# Patient Record
Sex: Female | Born: 1970
Health system: Southern US, Community
[De-identification: ages and names within clinical notes are randomized; demographics above are authoritative.]

## PROBLEM LIST (undated history)

## (undated) DIAGNOSIS — N83209 Unspecified ovarian cyst, unspecified side: Secondary | ICD-10-CM

## (undated) DIAGNOSIS — E282 Polycystic ovarian syndrome: Secondary | ICD-10-CM

## (undated) DIAGNOSIS — N839 Noninflammatory disorder of ovary, fallopian tube and broad ligament, unspecified: Secondary | ICD-10-CM

## (undated) DIAGNOSIS — M5412 Radiculopathy, cervical region: Secondary | ICD-10-CM

## (undated) DIAGNOSIS — R002 Palpitations: Secondary | ICD-10-CM

## (undated) DIAGNOSIS — F419 Anxiety disorder, unspecified: Secondary | ICD-10-CM

## (undated) DIAGNOSIS — Z92241 Personal history of systemic steroid therapy: Secondary | ICD-10-CM

## (undated) DIAGNOSIS — R519 Headache, unspecified: Secondary | ICD-10-CM

## (undated) DIAGNOSIS — C9210 Chronic myeloid leukemia, BCR/ABL-positive, not having achieved remission: Secondary | ICD-10-CM

## (undated) DIAGNOSIS — N3010 Interstitial cystitis (chronic) without hematuria: Secondary | ICD-10-CM

## (undated) DIAGNOSIS — R29818 Other symptoms and signs involving the nervous system: Secondary | ICD-10-CM

## (undated) DIAGNOSIS — Z9889 Other specified postprocedural states: Secondary | ICD-10-CM

## (undated) DIAGNOSIS — J189 Pneumonia, unspecified organism: Secondary | ICD-10-CM

## (undated) DIAGNOSIS — J45909 Unspecified asthma, uncomplicated: Secondary | ICD-10-CM

## (undated) DIAGNOSIS — N7011 Chronic salpingitis: Secondary | ICD-10-CM

## (undated) DIAGNOSIS — R51 Headache: Secondary | ICD-10-CM

## (undated) DIAGNOSIS — M1990 Unspecified osteoarthritis, unspecified site: Secondary | ICD-10-CM

## (undated) DIAGNOSIS — R569 Unspecified convulsions: Secondary | ICD-10-CM

## (undated) DIAGNOSIS — D069 Carcinoma in situ of cervix, unspecified: Secondary | ICD-10-CM

## (undated) DIAGNOSIS — K8020 Calculus of gallbladder without cholecystitis without obstruction: Secondary | ICD-10-CM

## (undated) HISTORY — DX: Interstitial cystitis (chronic) without hematuria: N30.10

## (undated) HISTORY — PX: POLYPECTOMY: SHX149

## (undated) HISTORY — DX: Other symptoms and signs involving the nervous system: R29.818

## (undated) HISTORY — DX: Chronic salpingitis: N70.11

## (undated) HISTORY — DX: Carcinoma in situ of cervix, unspecified: D06.9

## (undated) HISTORY — DX: Other specified postprocedural states: Z98.890

## (undated) HISTORY — DX: Palpitations: R00.2

## (undated) HISTORY — PX: COLONOSCOPY: SHX174

## (undated) HISTORY — PX: DENTAL SURGERY: SHX609

## (undated) HISTORY — DX: Unspecified asthma, uncomplicated: J45.909

## (undated) HISTORY — PX: OTHER SURGICAL HISTORY: SHX169

## (undated) HISTORY — DX: Polycystic ovarian syndrome: E28.2

## (undated) HISTORY — DX: Calculus of gallbladder without cholecystitis without obstruction: K80.20

## (undated) HISTORY — DX: Chronic myeloid leukemia, BCR/ABL-positive, not having achieved remission: C92.10

## (undated) HISTORY — DX: Noninflammatory disorder of ovary, fallopian tube and broad ligament, unspecified: N83.9

## (undated) HISTORY — DX: Unspecified convulsions: R56.9

## (undated) HISTORY — DX: Anxiety disorder, unspecified: F41.9

## (undated) HISTORY — DX: Unspecified osteoarthritis, unspecified site: M19.90

## (undated) HISTORY — PX: UPPER GASTROINTESTINAL ENDOSCOPY: SHX188

## (undated) HISTORY — PX: BUNIONECTOMY: SHX129

## (undated) HISTORY — PX: UPPER GI ENDOSCOPY: SHX6162

---
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
## 1996-04-03 HISTORY — PX: LEEP: SHX91

## 1997-09-08 ENCOUNTER — Emergency Department (HOSPITAL_COMMUNITY): Admission: EM | Admit: 1997-09-08 | Discharge: 1997-09-08 | Payer: Self-pay | Admitting: Emergency Medicine

## 1998-09-18 ENCOUNTER — Emergency Department (HOSPITAL_COMMUNITY): Admission: EM | Admit: 1998-09-18 | Discharge: 1998-09-18 | Payer: Self-pay | Admitting: *Deleted

## 1998-09-18 ENCOUNTER — Encounter: Payer: Self-pay | Admitting: *Deleted

## 1999-04-04 HISTORY — PX: TONSILLECTOMY: SUR1361

## 1999-12-27 ENCOUNTER — Encounter: Payer: Self-pay | Admitting: Family Medicine

## 1999-12-27 ENCOUNTER — Encounter: Admission: RE | Admit: 1999-12-27 | Discharge: 1999-12-27 | Payer: Self-pay | Admitting: Family Medicine

## 2000-02-22 ENCOUNTER — Other Ambulatory Visit: Admission: RE | Admit: 2000-02-22 | Discharge: 2000-02-22 | Payer: Self-pay | Admitting: *Deleted

## 2000-02-22 ENCOUNTER — Encounter (INDEPENDENT_AMBULATORY_CARE_PROVIDER_SITE_OTHER): Payer: Self-pay

## 2000-04-03 DIAGNOSIS — D069 Carcinoma in situ of cervix, unspecified: Secondary | ICD-10-CM

## 2000-04-03 HISTORY — DX: Carcinoma in situ of cervix, unspecified: D06.9

## 2000-04-18 ENCOUNTER — Other Ambulatory Visit: Admission: RE | Admit: 2000-04-18 | Discharge: 2000-04-18 | Payer: Self-pay | Admitting: *Deleted

## 2000-04-18 ENCOUNTER — Encounter (INDEPENDENT_AMBULATORY_CARE_PROVIDER_SITE_OTHER): Payer: Self-pay

## 2000-08-24 ENCOUNTER — Other Ambulatory Visit: Admission: RE | Admit: 2000-08-24 | Discharge: 2000-08-24 | Payer: Self-pay | Admitting: *Deleted

## 2000-08-24 ENCOUNTER — Encounter (INDEPENDENT_AMBULATORY_CARE_PROVIDER_SITE_OTHER): Payer: Self-pay

## 2000-12-27 ENCOUNTER — Other Ambulatory Visit: Admission: RE | Admit: 2000-12-27 | Discharge: 2000-12-27 | Payer: Self-pay | Admitting: *Deleted

## 2001-04-15 ENCOUNTER — Encounter (INDEPENDENT_AMBULATORY_CARE_PROVIDER_SITE_OTHER): Payer: Self-pay | Admitting: *Deleted

## 2001-04-15 ENCOUNTER — Ambulatory Visit (HOSPITAL_BASED_OUTPATIENT_CLINIC_OR_DEPARTMENT_OTHER): Admission: RE | Admit: 2001-04-15 | Discharge: 2001-04-15 | Payer: Self-pay | Admitting: Otolaryngology

## 2001-12-10 ENCOUNTER — Encounter: Payer: Self-pay | Admitting: Neurosurgery

## 2001-12-10 ENCOUNTER — Observation Stay (HOSPITAL_COMMUNITY): Admission: RE | Admit: 2001-12-10 | Discharge: 2001-12-11 | Payer: Self-pay | Admitting: Neurosurgery

## 2003-04-04 DIAGNOSIS — R569 Unspecified convulsions: Secondary | ICD-10-CM

## 2003-04-04 HISTORY — DX: Unspecified convulsions: R56.9

## 2003-04-04 HISTORY — PX: CERVICAL DISC SURGERY: SHX588

## 2003-05-05 ENCOUNTER — Emergency Department (HOSPITAL_COMMUNITY): Admission: EM | Admit: 2003-05-05 | Discharge: 2003-05-05 | Payer: Self-pay | Admitting: Emergency Medicine

## 2003-05-29 ENCOUNTER — Emergency Department (HOSPITAL_COMMUNITY): Admission: EM | Admit: 2003-05-29 | Discharge: 2003-05-29 | Payer: Self-pay | Admitting: Family Medicine

## 2003-06-25 ENCOUNTER — Encounter: Admission: RE | Admit: 2003-06-25 | Discharge: 2003-06-25 | Payer: Self-pay | Admitting: Internal Medicine

## 2003-07-24 ENCOUNTER — Encounter: Admission: RE | Admit: 2003-07-24 | Discharge: 2003-07-24 | Payer: Self-pay | Admitting: Internal Medicine

## 2003-11-19 ENCOUNTER — Emergency Department (HOSPITAL_COMMUNITY): Admission: EM | Admit: 2003-11-19 | Discharge: 2003-11-19 | Payer: Self-pay | Admitting: Emergency Medicine

## 2004-07-01 ENCOUNTER — Ambulatory Visit: Payer: Self-pay | Admitting: Internal Medicine

## 2004-11-10 ENCOUNTER — Emergency Department (HOSPITAL_COMMUNITY): Admission: EM | Admit: 2004-11-10 | Discharge: 2004-11-10 | Payer: Self-pay | Admitting: Emergency Medicine

## 2004-12-04 ENCOUNTER — Emergency Department (HOSPITAL_COMMUNITY): Admission: EM | Admit: 2004-12-04 | Discharge: 2004-12-04 | Payer: Self-pay | Admitting: *Deleted

## 2005-05-24 ENCOUNTER — Ambulatory Visit: Payer: Self-pay | Admitting: Internal Medicine

## 2005-05-29 ENCOUNTER — Ambulatory Visit (HOSPITAL_COMMUNITY): Admission: RE | Admit: 2005-05-29 | Discharge: 2005-05-29 | Payer: Self-pay | Admitting: *Deleted

## 2005-06-08 ENCOUNTER — Ambulatory Visit (HOSPITAL_COMMUNITY): Admission: RE | Admit: 2005-06-08 | Discharge: 2005-06-08 | Payer: Self-pay | Admitting: *Deleted

## 2005-07-06 ENCOUNTER — Ambulatory Visit: Payer: Self-pay | Admitting: Hospitalist

## 2005-07-06 ENCOUNTER — Encounter: Admission: RE | Admit: 2005-07-06 | Discharge: 2005-07-06 | Payer: Self-pay | Admitting: Hospitalist

## 2005-07-06 ENCOUNTER — Ambulatory Visit (HOSPITAL_COMMUNITY): Admission: RE | Admit: 2005-07-06 | Discharge: 2005-07-06 | Payer: Self-pay | Admitting: Hospitalist

## 2005-07-10 ENCOUNTER — Encounter (INDEPENDENT_AMBULATORY_CARE_PROVIDER_SITE_OTHER): Payer: Self-pay | Admitting: *Deleted

## 2005-07-10 ENCOUNTER — Ambulatory Visit (HOSPITAL_COMMUNITY): Admission: RE | Admit: 2005-07-10 | Discharge: 2005-07-10 | Payer: Self-pay | Admitting: Obstetrics & Gynecology

## 2005-07-12 ENCOUNTER — Ambulatory Visit: Payer: Self-pay | Admitting: Internal Medicine

## 2005-07-12 ENCOUNTER — Ambulatory Visit (HOSPITAL_COMMUNITY): Admission: RE | Admit: 2005-07-12 | Discharge: 2005-07-12 | Payer: Self-pay | Admitting: Internal Medicine

## 2005-07-31 ENCOUNTER — Ambulatory Visit: Payer: Self-pay | Admitting: Hospitalist

## 2005-07-31 ENCOUNTER — Ambulatory Visit (HOSPITAL_COMMUNITY): Admission: RE | Admit: 2005-07-31 | Discharge: 2005-07-31 | Payer: Self-pay | Admitting: Hospitalist

## 2005-08-01 ENCOUNTER — Ambulatory Visit: Payer: Self-pay | Admitting: Internal Medicine

## 2005-11-21 ENCOUNTER — Ambulatory Visit: Payer: Self-pay | Admitting: Hospitalist

## 2005-12-21 ENCOUNTER — Ambulatory Visit: Payer: Self-pay | Admitting: Hospitalist

## 2006-01-08 DIAGNOSIS — R42 Dizziness and giddiness: Secondary | ICD-10-CM | POA: Insufficient documentation

## 2006-01-08 DIAGNOSIS — H5310 Unspecified subjective visual disturbances: Secondary | ICD-10-CM | POA: Insufficient documentation

## 2006-01-08 DIAGNOSIS — H539 Unspecified visual disturbance: Secondary | ICD-10-CM | POA: Insufficient documentation

## 2006-01-08 DIAGNOSIS — M5030 Other cervical disc degeneration, unspecified cervical region: Secondary | ICD-10-CM | POA: Insufficient documentation

## 2006-01-08 DIAGNOSIS — N6019 Diffuse cystic mastopathy of unspecified breast: Secondary | ICD-10-CM | POA: Insufficient documentation

## 2006-01-08 DIAGNOSIS — F17200 Nicotine dependence, unspecified, uncomplicated: Secondary | ICD-10-CM | POA: Insufficient documentation

## 2006-04-11 ENCOUNTER — Ambulatory Visit (HOSPITAL_COMMUNITY): Admission: RE | Admit: 2006-04-11 | Discharge: 2006-04-11 | Payer: Self-pay | Admitting: Internal Medicine

## 2006-04-11 ENCOUNTER — Ambulatory Visit: Payer: Self-pay | Admitting: Internal Medicine

## 2006-04-27 ENCOUNTER — Encounter: Admission: RE | Admit: 2006-04-27 | Discharge: 2006-05-25 | Payer: Self-pay | Admitting: Internal Medicine

## 2006-05-25 ENCOUNTER — Encounter (INDEPENDENT_AMBULATORY_CARE_PROVIDER_SITE_OTHER): Payer: Self-pay | Admitting: *Deleted

## 2006-06-29 ENCOUNTER — Ambulatory Visit: Payer: Self-pay | Admitting: Internal Medicine

## 2006-06-29 ENCOUNTER — Encounter (INDEPENDENT_AMBULATORY_CARE_PROVIDER_SITE_OTHER): Payer: Self-pay | Admitting: *Deleted

## 2006-06-29 DIAGNOSIS — R439 Unspecified disturbances of smell and taste: Secondary | ICD-10-CM | POA: Insufficient documentation

## 2006-06-29 LAB — CONVERTED CEMR LAB
ALT: 17 units/L (ref 0–35)
AST: 16 units/L (ref 0–37)
Albumin: 4.3 g/dL (ref 3.5–5.2)
Alkaline Phosphatase: 52 units/L (ref 39–117)
BUN: 11 mg/dL (ref 6–23)
CO2: 22 meq/L (ref 19–32)
Calcium: 9.3 mg/dL (ref 8.4–10.5)
Chloride: 105 meq/L (ref 96–112)
Creatinine, Ser: 0.84 mg/dL (ref 0.40–1.20)
Glucose, Bld: 93 mg/dL (ref 70–99)
HCT: 40.6 % (ref 36.0–46.0)
Hemoglobin: 13.8 g/dL (ref 12.0–15.0)
MCHC: 34 g/dL (ref 30.0–36.0)
MCV: 84.9 fL (ref 78.0–100.0)
Platelets: 279 10*3/uL (ref 150–400)
Potassium: 3.8 meq/L (ref 3.5–5.3)
RBC: 4.78 M/uL (ref 3.87–5.11)
RDW: 12.8 % (ref 11.5–14.0)
Sodium: 139 meq/L (ref 135–145)
Total Bilirubin: 0.4 mg/dL (ref 0.3–1.2)
Total Protein: 6.9 g/dL (ref 6.0–8.3)
WBC: 8.9 10*3/uL (ref 4.0–10.5)

## 2006-07-07 ENCOUNTER — Ambulatory Visit (HOSPITAL_COMMUNITY): Admission: RE | Admit: 2006-07-07 | Discharge: 2006-07-07 | Payer: Self-pay | Admitting: *Deleted

## 2006-10-08 ENCOUNTER — Telehealth: Payer: Self-pay | Admitting: *Deleted

## 2006-10-09 ENCOUNTER — Ambulatory Visit: Payer: Self-pay | Admitting: Internal Medicine

## 2006-10-09 ENCOUNTER — Emergency Department (HOSPITAL_COMMUNITY): Admission: EM | Admit: 2006-10-09 | Discharge: 2006-10-09 | Payer: Self-pay | Admitting: Emergency Medicine

## 2006-10-10 ENCOUNTER — Telehealth (INDEPENDENT_AMBULATORY_CARE_PROVIDER_SITE_OTHER): Payer: Self-pay | Admitting: *Deleted

## 2006-10-15 ENCOUNTER — Telehealth (INDEPENDENT_AMBULATORY_CARE_PROVIDER_SITE_OTHER): Payer: Self-pay | Admitting: *Deleted

## 2006-11-06 ENCOUNTER — Telehealth: Payer: Self-pay | Admitting: *Deleted

## 2006-11-07 ENCOUNTER — Encounter (INDEPENDENT_AMBULATORY_CARE_PROVIDER_SITE_OTHER): Payer: Self-pay | Admitting: *Deleted

## 2006-11-07 ENCOUNTER — Ambulatory Visit: Payer: Self-pay | Admitting: Internal Medicine

## 2006-11-07 ENCOUNTER — Ambulatory Visit (HOSPITAL_COMMUNITY): Admission: RE | Admit: 2006-11-07 | Discharge: 2006-11-07 | Payer: Self-pay | Admitting: Internal Medicine

## 2006-11-07 DIAGNOSIS — R002 Palpitations: Secondary | ICD-10-CM | POA: Insufficient documentation

## 2006-11-09 ENCOUNTER — Encounter (INDEPENDENT_AMBULATORY_CARE_PROVIDER_SITE_OTHER): Payer: Self-pay | Admitting: *Deleted

## 2006-11-09 ENCOUNTER — Ambulatory Visit: Payer: Self-pay | Admitting: Internal Medicine

## 2006-11-09 LAB — CONVERTED CEMR LAB
ALT: 15 units/L (ref 0–35)
AST: 15 units/L (ref 0–37)
Albumin: 4 g/dL (ref 3.5–5.2)
Alkaline Phosphatase: 47 units/L (ref 39–117)
BUN: 10 mg/dL (ref 6–23)
Basophils Absolute: 0.1 10*3/uL (ref 0.0–0.1)
Basophils Relative: 1 % (ref 0–1)
CO2: 25 meq/L (ref 19–32)
Calcium: 9.4 mg/dL (ref 8.4–10.5)
Chloride: 107 meq/L (ref 96–112)
Cholesterol: 155 mg/dL (ref 0–200)
Creatinine, Ser: 0.77 mg/dL (ref 0.40–1.20)
Eosinophils Absolute: 0.5 10*3/uL (ref 0.0–0.7)
Eosinophils Relative: 6 % — ABNORMAL HIGH (ref 0–5)
Glucose, Bld: 82 mg/dL (ref 70–99)
HCT: 43.3 % (ref 36.0–46.0)
HDL: 45 mg/dL (ref >39)
Hemoglobin: 14.9 g/dL (ref 12.0–15.0)
LDL Cholesterol: 96 mg/dL (ref 0–99)
Lymphocytes Relative: 33 % (ref 12–46)
Lymphs Abs: 2.6 10*3/uL (ref 0.7–3.3)
MCHC: 34.4 g/dL (ref 30.0–36.0)
MCV: 85.6 fL (ref 78.0–100.0)
Monocytes Absolute: 0.5 10*3/uL (ref 0.2–0.7)
Monocytes Relative: 7 % (ref 3–11)
Neutro Abs: 4.4 10*3/uL (ref 1.7–7.7)
Neutrophils Relative %: 55 % (ref 43–77)
Platelets: 284 10*3/uL (ref 150–400)
Potassium: 4.4 meq/L (ref 3.5–5.3)
RBC: 5.06 M/uL (ref 3.87–5.11)
RDW: 13 % (ref 11.5–14.0)
Sodium: 139 meq/L (ref 135–145)
TSH: 0.998 microintl units/mL (ref 0.350–5.50)
Total Bilirubin: 0.6 mg/dL (ref 0.3–1.2)
Total CHOL/HDL Ratio: 3.4
Total Protein: 6.6 g/dL (ref 6.0–8.3)
Triglycerides: 70 mg/dL (ref <150)
VLDL: 14 mg/dL (ref 0–40)
WBC: 8.1 10*3/uL (ref 4.0–10.5)

## 2006-11-21 ENCOUNTER — Ambulatory Visit: Payer: Self-pay | Admitting: Internal Medicine

## 2007-01-29 ENCOUNTER — Ambulatory Visit (HOSPITAL_COMMUNITY): Admission: RE | Admit: 2007-01-29 | Discharge: 2007-01-29 | Payer: Self-pay | Admitting: *Deleted

## 2007-02-10 ENCOUNTER — Emergency Department (HOSPITAL_COMMUNITY): Admission: EM | Admit: 2007-02-10 | Discharge: 2007-02-10 | Payer: Self-pay | Admitting: Emergency Medicine

## 2007-04-01 ENCOUNTER — Ambulatory Visit: Payer: Self-pay | Admitting: Internal Medicine

## 2007-04-01 ENCOUNTER — Encounter (INDEPENDENT_AMBULATORY_CARE_PROVIDER_SITE_OTHER): Payer: Self-pay | Admitting: Internal Medicine

## 2007-04-01 LAB — CONVERTED CEMR LAB
ALT: 17 units/L (ref 0–35)
AST: 16 units/L (ref 0–37)
Albumin: 4.6 g/dL (ref 3.5–5.2)
Alkaline Phosphatase: 53 units/L (ref 39–117)
BUN: 10 mg/dL (ref 6–23)
CO2: 26 meq/L (ref 19–32)
Calcium: 9.9 mg/dL (ref 8.4–10.5)
Chloride: 103 meq/L (ref 96–112)
Creatinine, Ser: 0.78 mg/dL (ref 0.40–1.20)
Glucose, Bld: 88 mg/dL (ref 70–99)
Potassium: 4.5 meq/L (ref 3.5–5.3)
Sodium: 140 meq/L (ref 135–145)
Total Bilirubin: 0.7 mg/dL (ref 0.3–1.2)
Total Protein: 7.3 g/dL (ref 6.0–8.3)

## 2007-04-08 ENCOUNTER — Encounter (INDEPENDENT_AMBULATORY_CARE_PROVIDER_SITE_OTHER): Payer: Self-pay | Admitting: Internal Medicine

## 2007-04-08 ENCOUNTER — Ambulatory Visit: Payer: Self-pay | Admitting: Internal Medicine

## 2007-04-08 DIAGNOSIS — R1031 Right lower quadrant pain: Secondary | ICD-10-CM | POA: Insufficient documentation

## 2007-04-08 LAB — CONVERTED CEMR LAB
Beta hcg, urine, semiquantitative: NEGATIVE
Bilirubin Urine: NEGATIVE
Blood in Urine, dipstick: NEGATIVE
Glucose, Urine, Semiquant: NEGATIVE
Ketones, urine, test strip: NEGATIVE
Nitrite: NEGATIVE
Protein, U semiquant: NEGATIVE
Specific Gravity, Urine: 1.015
Urobilinogen, UA: NEGATIVE
WBC Urine, dipstick: NEGATIVE
pH: 5.5

## 2007-04-09 ENCOUNTER — Encounter (INDEPENDENT_AMBULATORY_CARE_PROVIDER_SITE_OTHER): Payer: Self-pay | Admitting: Internal Medicine

## 2007-04-09 LAB — CONVERTED CEMR LAB
Chlamydia, DNA Probe: NEGATIVE
GC Probe Amp, Genital: NEGATIVE

## 2007-04-10 LAB — CONVERTED CEMR LAB
Candida species: NEGATIVE
Gardnerella vaginalis: NEGATIVE
Trichomonal Vaginitis: NEGATIVE

## 2007-04-22 ENCOUNTER — Ambulatory Visit: Payer: Self-pay | Admitting: Internal Medicine

## 2007-04-22 ENCOUNTER — Telehealth: Payer: Self-pay | Admitting: *Deleted

## 2007-04-22 DIAGNOSIS — G988 Other disorders of nervous system: Secondary | ICD-10-CM | POA: Insufficient documentation

## 2007-05-09 ENCOUNTER — Ambulatory Visit: Payer: Self-pay | Admitting: Internal Medicine

## 2007-05-09 ENCOUNTER — Encounter (INDEPENDENT_AMBULATORY_CARE_PROVIDER_SITE_OTHER): Payer: Self-pay | Admitting: *Deleted

## 2007-05-09 DIAGNOSIS — M130 Polyarthritis, unspecified: Secondary | ICD-10-CM | POA: Insufficient documentation

## 2007-05-09 LAB — CONVERTED CEMR LAB
Anti Nuclear Antibody(ANA): NEGATIVE
Bilirubin Urine: NEGATIVE
Cyclic Citrullin Peptide Ab: 2 units (ref <7)
Hemoglobin, Urine: NEGATIVE
Ketones, ur: NEGATIVE mg/dL
Leukocytes, UA: NEGATIVE
Nitrite: NEGATIVE
Protein, ur: NEGATIVE mg/dL
Rhuematoid fact SerPl-aCnc: <20 intl units/mL (ref 0–20)
Sed Rate: 7 mm/hr (ref 0–22)
Specific Gravity, Urine: 1.015 (ref 1.005–1.03)
TSH: 1.463 microintl units/mL (ref 0.350–5.50)
Urine Glucose: NEGATIVE mg/dL
Urobilinogen, UA: 0.2 (ref 0.0–1.0)
pH: 6.5 (ref 5.0–8.0)

## 2007-05-13 ENCOUNTER — Encounter: Payer: Self-pay | Admitting: Internal Medicine

## 2007-05-13 ENCOUNTER — Telehealth: Payer: Self-pay | Admitting: *Deleted

## 2007-05-13 ENCOUNTER — Ambulatory Visit: Payer: Self-pay | Admitting: Hospitalist

## 2007-05-13 DIAGNOSIS — N898 Other specified noninflammatory disorders of vagina: Secondary | ICD-10-CM | POA: Insufficient documentation

## 2007-05-13 LAB — CONVERTED CEMR LAB
ALT: 14 units/L (ref 0–35)
AST: 15 units/L (ref 0–37)
Albumin: 4.5 g/dL (ref 3.5–5.2)
Alkaline Phosphatase: 48 units/L (ref 39–117)
BUN: 11 mg/dL (ref 6–23)
Basophils Absolute: 0.1 10*3/uL (ref 0.0–0.1)
Basophils Relative: 1 % (ref 0–1)
Bilirubin Urine: NEGATIVE
CO2: 22 meq/L (ref 19–32)
Calcium: 9.5 mg/dL (ref 8.4–10.5)
Chlamydia, DNA Probe: NEGATIVE
Chloride: 106 meq/L (ref 96–112)
Creatinine, Ser: 0.8 mg/dL (ref 0.40–1.20)
Eosinophils Absolute: 0.3 10*3/uL (ref 0.0–0.7)
Eosinophils Relative: 3 % (ref 0–5)
GC Probe Amp, Genital: NEGATIVE
Glucose, Bld: 84 mg/dL (ref 70–99)
HCT: 41.5 % (ref 36.0–46.0)
Hemoglobin, Urine: NEGATIVE
Hemoglobin: 13.7 g/dL (ref 12.0–15.0)
Ketones, ur: NEGATIVE mg/dL
Leukocytes, UA: NEGATIVE
Lymphocytes Relative: 23 % (ref 12–46)
Lymphs Abs: 2.1 10*3/uL (ref 0.7–4.0)
MCHC: 33 g/dL (ref 30.0–36.0)
MCV: 86.8 fL (ref 78.0–100.0)
Monocytes Absolute: 0.5 10*3/uL (ref 0.1–1.0)
Monocytes Relative: 6 % (ref 3–12)
Neutro Abs: 6.1 10*3/uL (ref 1.7–7.7)
Neutrophils Relative %: 67 % (ref 43–77)
Nitrite: NEGATIVE
Platelets: 281 10*3/uL (ref 150–400)
Potassium: 4.2 meq/L (ref 3.5–5.3)
Protein, ur: NEGATIVE mg/dL
RBC: 4.78 M/uL (ref 3.87–5.11)
RDW: 13 % (ref 11.5–15.5)
Sodium: 139 meq/L (ref 135–145)
Specific Gravity, Urine: 1.02 (ref 1.005–1.03)
Tissue Transglutaminase Ab, IgA: 0.3 units (ref <7)
Total Bilirubin: 0.7 mg/dL (ref 0.3–1.2)
Total Protein: 7 g/dL (ref 6.0–8.3)
Urine Glucose: NEGATIVE mg/dL
Urobilinogen, UA: 0.2 (ref 0.0–1.0)
WBC: 9.1 10*3/uL (ref 4.0–10.5)
pH: 7 (ref 5.0–8.0)

## 2007-05-14 LAB — CONVERTED CEMR LAB
Candida species: POSITIVE — AB
Gardnerella vaginalis: NEGATIVE
Trichomonal Vaginitis: NEGATIVE

## 2007-05-17 ENCOUNTER — Emergency Department (HOSPITAL_COMMUNITY): Admission: EM | Admit: 2007-05-17 | Discharge: 2007-05-17 | Payer: Self-pay | Admitting: Emergency Medicine

## 2007-05-28 ENCOUNTER — Ambulatory Visit: Payer: Self-pay | Admitting: Internal Medicine

## 2007-05-28 DIAGNOSIS — F411 Generalized anxiety disorder: Secondary | ICD-10-CM | POA: Insufficient documentation

## 2007-05-28 LAB — CONVERTED CEMR LAB
Bilirubin Urine: NEGATIVE
Glucose, Urine, Semiquant: NEGATIVE
Ketones, urine, test strip: NEGATIVE
Nitrite: NEGATIVE
Protein, U semiquant: NEGATIVE
Specific Gravity, Urine: 1.01
Urobilinogen, UA: 1
WBC Urine, dipstick: NEGATIVE
pH: 7

## 2007-05-31 ENCOUNTER — Ambulatory Visit (HOSPITAL_COMMUNITY): Admission: RE | Admit: 2007-05-31 | Discharge: 2007-05-31 | Payer: Self-pay | Admitting: Internal Medicine

## 2007-06-06 ENCOUNTER — Ambulatory Visit: Payer: Self-pay | Admitting: *Deleted

## 2007-06-06 DIAGNOSIS — N83209 Unspecified ovarian cyst, unspecified side: Secondary | ICD-10-CM | POA: Insufficient documentation

## 2007-06-10 ENCOUNTER — Ambulatory Visit (HOSPITAL_COMMUNITY): Admission: RE | Admit: 2007-06-10 | Discharge: 2007-06-10 | Payer: Self-pay | Admitting: *Deleted

## 2007-06-21 ENCOUNTER — Telehealth (INDEPENDENT_AMBULATORY_CARE_PROVIDER_SITE_OTHER): Payer: Self-pay | Admitting: *Deleted

## 2007-07-18 ENCOUNTER — Telehealth: Payer: Self-pay | Admitting: Infectious Disease

## 2007-07-18 ENCOUNTER — Emergency Department (HOSPITAL_COMMUNITY): Admission: EM | Admit: 2007-07-18 | Discharge: 2007-07-18 | Payer: Self-pay | Admitting: Emergency Medicine

## 2007-07-26 ENCOUNTER — Ambulatory Visit: Payer: Self-pay | Admitting: *Deleted

## 2007-07-26 ENCOUNTER — Encounter: Payer: Self-pay | Admitting: Internal Medicine

## 2007-08-28 ENCOUNTER — Telehealth: Payer: Self-pay | Admitting: *Deleted

## 2007-08-28 ENCOUNTER — Encounter (INDEPENDENT_AMBULATORY_CARE_PROVIDER_SITE_OTHER): Payer: Self-pay | Admitting: Internal Medicine

## 2007-08-28 ENCOUNTER — Ambulatory Visit: Payer: Self-pay | Admitting: Internal Medicine

## 2007-08-29 LAB — CONVERTED CEMR LAB
Bilirubin Urine: NEGATIVE
Hemoglobin, Urine: NEGATIVE
Ketones, ur: NEGATIVE mg/dL
Leukocytes, UA: NEGATIVE
Nitrite: NEGATIVE
Protein, ur: NEGATIVE mg/dL
Specific Gravity, Urine: 1.02 (ref 1.005–1.03)
Urine Glucose: NEGATIVE mg/dL
Urobilinogen, UA: 1 (ref 0.0–1.0)
pH: 7 (ref 5.0–8.0)

## 2007-10-14 ENCOUNTER — Ambulatory Visit: Payer: Self-pay | Admitting: *Deleted

## 2007-10-14 ENCOUNTER — Encounter: Payer: Self-pay | Admitting: Internal Medicine

## 2007-10-14 ENCOUNTER — Ambulatory Visit (HOSPITAL_COMMUNITY): Admission: RE | Admit: 2007-10-14 | Discharge: 2007-10-14 | Payer: Self-pay | Admitting: *Deleted

## 2007-10-14 DIAGNOSIS — R000 Tachycardia, unspecified: Secondary | ICD-10-CM | POA: Insufficient documentation

## 2007-10-28 ENCOUNTER — Telehealth: Payer: Self-pay | Admitting: *Deleted

## 2007-10-28 ENCOUNTER — Ambulatory Visit: Payer: Self-pay | Admitting: Internal Medicine

## 2007-10-28 DIAGNOSIS — R51 Headache: Secondary | ICD-10-CM | POA: Insufficient documentation

## 2007-10-28 DIAGNOSIS — R519 Headache, unspecified: Secondary | ICD-10-CM | POA: Insufficient documentation

## 2007-11-29 ENCOUNTER — Encounter (INDEPENDENT_AMBULATORY_CARE_PROVIDER_SITE_OTHER): Payer: Self-pay | Admitting: *Deleted

## 2007-11-29 ENCOUNTER — Ambulatory Visit: Payer: Self-pay | Admitting: Internal Medicine

## 2007-12-24 ENCOUNTER — Telehealth (INDEPENDENT_AMBULATORY_CARE_PROVIDER_SITE_OTHER): Payer: Self-pay | Admitting: Internal Medicine

## 2007-12-24 ENCOUNTER — Encounter (INDEPENDENT_AMBULATORY_CARE_PROVIDER_SITE_OTHER): Payer: Self-pay | Admitting: Internal Medicine

## 2008-01-14 ENCOUNTER — Ambulatory Visit: Payer: Self-pay | Admitting: Infectious Disease

## 2008-01-14 ENCOUNTER — Encounter: Payer: Self-pay | Admitting: Internal Medicine

## 2008-01-18 LAB — CONVERTED CEMR LAB
ALT: 12 units/L (ref 0–35)
AST: 15 units/L (ref 0–37)
Albumin ELP: 61.2 % (ref 55.8–66.1)
Albumin: 4.5 g/dL (ref 3.5–5.2)
Alkaline Phosphatase: 47 units/L (ref 39–117)
Alpha-1-Globulin: 3.9 % (ref 2.9–4.9)
Alpha-2-Globulin: 9.2 % (ref 7.1–11.8)
Anti Nuclear Antibody(ANA): NEGATIVE
BUN: 14 mg/dL (ref 6–23)
Basophils Absolute: 0.1 10*3/uL (ref 0.0–0.1)
Basophils Relative: 1 % (ref 0–1)
Beta Globulin: 5.2 % (ref 4.7–7.2)
CO2: 22 meq/L (ref 19–32)
CRP: 0.1 mg/dL (ref <0.6)
Calcium: 9.4 mg/dL (ref 8.4–10.5)
Chloride: 106 meq/L (ref 96–112)
Creatinine, Ser: 0.77 mg/dL (ref 0.40–1.20)
Eosinophils Absolute: 0.6 10*3/uL (ref 0.0–0.7)
Eosinophils Relative: 5 % (ref 0–5)
Gamma Globulin: 16.3 % (ref 11.1–18.8)
Glucose, Bld: 85 mg/dL (ref 70–99)
HCT: 39.3 % (ref 36.0–46.0)
HCV Ab: NEGATIVE
Hemoglobin: 14 g/dL (ref 12.0–15.0)
Hep B Core Total Ab: NEGATIVE
Hep B S Ab: NEGATIVE
Hepatitis B Surface Ag: NEGATIVE
LDH: 138 units/L (ref 94–250)
Lymphocytes Relative: 27 % (ref 12–46)
Lymphs Abs: 3.4 10*3/uL (ref 0.7–4.0)
MCHC: 35.6 g/dL (ref 30.0–36.0)
MCV: 84.7 fL (ref 78.0–100.0)
Monocytes Absolute: 0.7 10*3/uL (ref 0.1–1.0)
Monocytes Relative: 6 % (ref 3–12)
Neutro Abs: 7.8 10*3/uL — ABNORMAL HIGH (ref 1.7–7.7)
Neutrophils Relative %: 62 % (ref 43–77)
Platelets: 280 10*3/uL (ref 150–400)
Potassium: 4.1 meq/L (ref 3.5–5.3)
RBC: 4.64 M/uL (ref 3.87–5.11)
RDW: 12.5 % (ref 11.5–15.5)
Sed Rate: 14 mm/hr (ref 0–22)
Sodium: 140 meq/L (ref 135–145)
TSH: 1.512 microintl units/mL (ref 0.350–4.50)
Total Bilirubin: 0.4 mg/dL (ref 0.3–1.2)
Total CK: 60 units/L (ref 7–177)
Total Protein, Serum Electrophoresis: 7.4 g/dL (ref 6.0–8.3)
Total Protein: 7.4 g/dL (ref 6.0–8.3)
WBC: 12.6 10*3/uL — ABNORMAL HIGH (ref 4.0–10.5)

## 2008-02-07 ENCOUNTER — Ambulatory Visit: Payer: Self-pay | Admitting: Infectious Diseases

## 2008-02-07 ENCOUNTER — Encounter (INDEPENDENT_AMBULATORY_CARE_PROVIDER_SITE_OTHER): Payer: Self-pay | Admitting: Internal Medicine

## 2008-02-07 ENCOUNTER — Encounter: Payer: Self-pay | Admitting: Internal Medicine

## 2008-02-07 DIAGNOSIS — N7013 Chronic salpingitis and oophoritis: Secondary | ICD-10-CM | POA: Insufficient documentation

## 2008-02-07 LAB — CONVERTED CEMR LAB
Bilirubin Urine: NEGATIVE
Hemoglobin, Urine: NEGATIVE
Ketones, ur: NEGATIVE mg/dL
Leukocytes, UA: NEGATIVE
Nitrite: NEGATIVE
Protein, ur: NEGATIVE mg/dL
Specific Gravity, Urine: 1.015 (ref 1.005–1.03)
Urine Glucose: NEGATIVE mg/dL
Urobilinogen, UA: 0.2 (ref 0.0–1.0)
pH: 7 (ref 5.0–8.0)

## 2008-02-25 ENCOUNTER — Ambulatory Visit: Payer: Self-pay | Admitting: *Deleted

## 2008-03-05 ENCOUNTER — Encounter: Payer: Self-pay | Admitting: Internal Medicine

## 2008-03-11 ENCOUNTER — Ambulatory Visit (HOSPITAL_COMMUNITY): Admission: RE | Admit: 2008-03-11 | Discharge: 2008-03-11 | Payer: Self-pay | Admitting: Internal Medicine

## 2008-03-23 ENCOUNTER — Encounter: Admission: RE | Admit: 2008-03-23 | Discharge: 2008-03-23 | Payer: Self-pay | Admitting: Internal Medicine

## 2008-03-23 LAB — HM MAMMOGRAPHY

## 2008-04-28 ENCOUNTER — Encounter: Payer: Self-pay | Admitting: Internal Medicine

## 2008-10-01 ENCOUNTER — Ambulatory Visit: Payer: Self-pay | Admitting: Internal Medicine

## 2008-10-01 ENCOUNTER — Encounter: Payer: Self-pay | Admitting: Internal Medicine

## 2008-10-01 ENCOUNTER — Telehealth: Payer: Self-pay | Admitting: *Deleted

## 2008-10-01 DIAGNOSIS — H60399 Other infective otitis externa, unspecified ear: Secondary | ICD-10-CM | POA: Insufficient documentation

## 2008-10-01 DIAGNOSIS — J029 Acute pharyngitis, unspecified: Secondary | ICD-10-CM | POA: Insufficient documentation

## 2008-10-01 DIAGNOSIS — J069 Acute upper respiratory infection, unspecified: Secondary | ICD-10-CM | POA: Insufficient documentation

## 2008-10-01 LAB — CONVERTED CEMR LAB: Streptococcus, Group A Screen (Direct): NEGATIVE

## 2008-10-02 DIAGNOSIS — R21 Rash and other nonspecific skin eruption: Secondary | ICD-10-CM | POA: Insufficient documentation

## 2008-10-15 ENCOUNTER — Ambulatory Visit: Payer: Self-pay | Admitting: Infectious Diseases

## 2008-10-15 DIAGNOSIS — J329 Chronic sinusitis, unspecified: Secondary | ICD-10-CM | POA: Insufficient documentation

## 2008-10-19 ENCOUNTER — Encounter: Payer: Self-pay | Admitting: Internal Medicine

## 2008-10-19 ENCOUNTER — Ambulatory Visit: Payer: Self-pay | Admitting: Infectious Diseases

## 2008-10-19 LAB — HM PAP SMEAR: HM Pap smear: NEGATIVE

## 2008-10-26 ENCOUNTER — Encounter: Payer: Self-pay | Admitting: Internal Medicine

## 2008-10-26 ENCOUNTER — Ambulatory Visit: Payer: Self-pay | Admitting: Internal Medicine

## 2008-10-26 LAB — CONVERTED CEMR LAB
Cholesterol: 152 mg/dL (ref 0–200)
HDL: 46 mg/dL (ref >39)
LDL Cholesterol: 96 mg/dL (ref 0–99)
Total CHOL/HDL Ratio: 3.3
Triglycerides: 52 mg/dL (ref <150)
VLDL: 10 mg/dL (ref 0–40)

## 2009-01-26 ENCOUNTER — Telehealth: Payer: Self-pay | Admitting: Internal Medicine

## 2009-06-08 ENCOUNTER — Ambulatory Visit: Payer: Self-pay | Admitting: Internal Medicine

## 2009-06-08 DIAGNOSIS — M5380 Other specified dorsopathies, site unspecified: Secondary | ICD-10-CM | POA: Insufficient documentation

## 2009-06-15 ENCOUNTER — Telehealth (INDEPENDENT_AMBULATORY_CARE_PROVIDER_SITE_OTHER): Payer: Self-pay | Admitting: Internal Medicine

## 2009-06-15 ENCOUNTER — Observation Stay (HOSPITAL_COMMUNITY): Admission: EM | Admit: 2009-06-15 | Discharge: 2009-06-16 | Payer: Self-pay | Admitting: Internal Medicine

## 2009-06-15 ENCOUNTER — Ambulatory Visit: Payer: Self-pay | Admitting: Internal Medicine

## 2009-06-15 ENCOUNTER — Ambulatory Visit (HOSPITAL_COMMUNITY): Admission: RE | Admit: 2009-06-15 | Discharge: 2009-06-15 | Payer: Self-pay | Admitting: Infectious Diseases

## 2009-06-15 ENCOUNTER — Encounter: Payer: Self-pay | Admitting: Internal Medicine

## 2009-06-15 ENCOUNTER — Ambulatory Visit: Payer: Self-pay | Admitting: Infectious Diseases

## 2009-06-16 ENCOUNTER — Encounter: Payer: Self-pay | Admitting: Internal Medicine

## 2009-06-30 ENCOUNTER — Ambulatory Visit: Payer: Self-pay | Admitting: Infectious Diseases

## 2009-09-08 ENCOUNTER — Telehealth: Payer: Self-pay | Admitting: Internal Medicine

## 2009-09-08 ENCOUNTER — Emergency Department (HOSPITAL_COMMUNITY): Admission: EM | Admit: 2009-09-08 | Discharge: 2009-09-08 | Payer: Self-pay | Admitting: Emergency Medicine

## 2010-04-24 ENCOUNTER — Encounter: Payer: Self-pay | Admitting: *Deleted

## 2010-05-05 NOTE — Progress Notes (Signed)
Summary: phone/gg  Phone Note Call from Patient   Caller: Patient Summary of Call: Pt called with c/o severe neck pain, headache yesterday, severe with nausea and vomiting. Not feeling any better. Does she need a re-check? Pt seen in clinic on 06/08/09 for c/o sinus problems. Initial call taken by: Merrie Roof RN,  June 15, 2009 9:43 AM  Follow-up for Phone Call        Yes, I think she should be seen today. I think I have a 2:30 appt available? Follow-up by: Silvestre Gunner MD,  June 15, 2009 11:14 AM  Additional Follow-up for Phone Call Additional follow up Details #1::        Pt will be seen today at 2:30 Additional Follow-up by: Merrie Roof RN,  June 15, 2009 11:48 AM

## 2010-05-05 NOTE — Assessment & Plan Note (Signed)
Summary: EST-NEEDS CHECKUP/CH   Vital Signs:  Patient Profile:   40 Years Old Female Weight:      155.5 pounds (70.68 kg) Temp:     98.8 degrees F (37.11 degrees C) oral Pulse rate:   93 / minute BP sitting:   110 / 70  (right arm)  Vitals Entered By: Krystal Eaton Duncan Dull) (June 29, 2006 1:44 PM)             Is Patient Diabetic? No Nutritional Status Normal  Have you ever been in a relationship where you felt threatened, hurt or afraid?No   Does patient need assistance? Functional Status Self care Ambulation Normal   Chief Complaint:  complete physical wtih pap.  History of Present Illness: This is a 40 year old woman with a past medical history of Multiple neurological symptoms with neg w/u MRI/MRA in march 2007 wnl except decr caliber MCA prox, distal cavernous portion of left LCA which may represent true stenosis or technique on exam. Been evaluated by Dr. Luberta Robertson ? partial seizures H/o cervical anaplasia, CIN3 2002,S/P LEEP Rx  repeat paps neg   who comes in once again with multiple complaints :  1-She is having pain radiating from her right neck down her right arm.  It seems to be two-fold : There is a "shock" feeling travelling all the way down to her wrist, but she also has a dull ache around her shoulder.  She denies weakness, as well any restriction of her ROM. 2-She started having her episodes of metallic taste in her mouth again (she hadn't had any in over 6 months), and now they last longer (up to a few weeks).  She denies heartburn, although she does endorse nausea.  She denies epigastric pain.  She has not noticed any jaundice.  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  She also mentionned that the episodes are almost always associated with :hiccups, fasciculation of her abdominal muscles and right earache.  Then they all disappear together.  Prior Medications (reviewed today): FLEXERIL  10 MG TABS (CYCLOBENZAPRINE HCL) take by mouth at bedtime for her neck spasm. Current Allergies (reviewed today): ! CODEINE     Review of Systems  General      Denies chills, fever, malaise, weakness, and weight loss.  Eyes      Denies blurring, double vision, and eye pain.  ENT      Denies decreased hearing and difficulty swallowing.      Denies any pain in her mouth or teeth.  CV      Denies chest pain or discomfort, difficulty breathing at night, shortness of breath with exertion, swelling of feet, and swelling of hands.  Resp      Denies cough and shortness of breath.  GI      Complains of nausea.      Denies abdominal pain, bloody stools, change in bowel habits, constipation, dark tarry stools, diarrhea, indigestion, and vomiting.  GU      Denies dysuria, hematuria, incontinence, and urinary frequency.  MS      See HPI   Physical Exam  General:     alert, well-developed, and well-nourished.   Head:     normocephalic and atraumatic.   Eyes:     vision grossly intact, pupils equal, pupils round, and pupils reactive to light.   Mouth:     fair dentition and excessive plaque.  Had previous fillings and root canals. Neck:     supple and full ROM.  Mild tenderness around C6-C7 with bulge. Lungs:     normal respiratory effort and normal breath sounds.   Heart:     normal rate, regular rhythm, no murmur, no gallop, and no rub.   Abdomen:     soft, non-tender, normal bowel sounds, no distention, and no masses.   Genitalia:     Pelvic Exam:        External: normal female genitalia without lesions or masses        Vagina: normal without lesions or masses        Cervix: normal without lesions or masses        Adnexa: normal bimanual exam without masses or fullness        Uterus: normal by palpation        Pap smear: performed Msk:     Shoulder : normal ROM.  Has localized tenderness around the coracoid process.  No bicipital groove tenderness, no shoulder joint  tenderness.  No joint instability. Neurologic:     alert & oriented X3, cranial nerves II-XII intact, strength normal in all extremities, sensation intact to light touch, and gait normal.   Psych:     Oriented X3.  Somewhat neurotic and circumstancial.    Impression & Recommendations:  Problem # 1:  PROBLEMS W/SMELL/TASTE (ICD-V41.5) Common things being common, this may well just be secondary to gastroesophageal reflux disease.  She never started her PPI a year ago as I had suggested, so we will start her on famotidine today to see if it helps any.  Will also check her for heavy metals, as well as check a CMET since hyperbilirubinemia can cause this, and check a CBC for any signs of heavy metal toxicity.  If all fails her symptoms are still present, she may need to see a dentist. Orders: T-Heavy Metal Quantitative Screen,each [phone]) T-CBC No Diff ([id number]) T-Comprehensive Metabolic Panel ([id number])   Problem # 2:  SHOULDER PAIN, RIGHT (ICD-719.41) She has two things : inflammation around coracoid process, which I told her to treat with over the counter anti-inflammatory, and referred pain from her previously diagnosed by CT-Scan in 2006 cervical disc disease.  She has already seen a neurosurgeon at [facility], who needs a recent MRI .  She has never had one, so will order this today. Her updated medication list for this problem includes:    Flexeril 10 Mg Tabs (Cyclobenzaprine hcl) .Marland Kitchen... Take by mouth at bedtime for her neck spasm.   Medications Added to Medication List This Visit: 1)  Famotidine 40 Mg Tabs (Famotidine) .... Take 1 tablet by mouth once a day  Other Orders: Pap Smear ([id number]) MRI (MRI) T-Pap Smear, Thin Prep ([id number])   Patient Instructions: 1)  Please schedule a follow-up appointment in 6 months. 2)  I will call you after I get your results if I need to see you sooner than 6 months. 3)  We will try to treat acid reflux disease empirically as it may be the  cause for your metallic taste. 4)  Avoid foods high in acid (tomatoes, citrus juices, spicy foods). Avoid eating within two hours of lying down or before exercising. Do not over eat; try smaller more frequent meals. Elevate head of bed twelve inches when sleeping.

## 2010-05-05 NOTE — Assessment & Plan Note (Signed)
Summary: headache per Venita Sheffield [mkj]   Vital Signs:  Patient Profile:   40 Years Old Female Height:     65.5 inches (166.37 cm) Weight:      155.3 pounds (70.59 kg) BMI:     25.54 Temp:     97.5 degrees F (36.39 degrees C) oral Pulse rate:   81 / minute BP sitting:   108 / 63  (right arm) Cuff size:   regular  Pt. in pain?   yes    Location:   LEGS    Intensity:   4-5    Type:       aching  Vitals Entered By: Theotis Barrio NT II (October 28, 2007 1:52 PM)              Is Patient Diabetic? No Nutritional Status BMI of 25 - 29 = overweight  Have you ever been in a relationship where you felt threatened, hurt or afraid?No   Does patient need assistance? Functional Status Self care Ambulation Normal Comments PATIENT HAS A LOT OF ISSUES. RASH  RIGHT ARE.     PCP:  Dellia Beckwith MD  Chief Complaint:  RIGHT BREAST AND ARM PIT AREA ( SinCE THIS MORNING) LEG PAINS / NIGHT SWEATS/HEADACHE.  History of Present Illness: 40years old female with pmh as outlined on the EMR. Who came [facility] to the [facility] complaining of legs pain, HA and right arm pit pain. Pt reports her symptoms have been present for the last 2-3 weeks prior to admission, but endorses that is not the first time that she experienced them, she said that they have been present on/off for the last 7-8 years. Pt described that her HA are distributed on right side of her head, no auras, no photophobia and denies nausea; she mentioned the pain is aggravated by stress and is relief by advil (partially). She described that the pain on her legs are cramping in quality, 4-5/10, no radiated, on/off, and without aggravated or alliviated factors. She mentioned experiencing also in the past, pain on her ankles, elbows and shoulders. She described that the pain on right arm pit is not related to her menstrual period (pt with hx of fibrocystic disease) and mentioned that 2/2 hx of breast cancer in her family she started mammograms at age of  40, all of them negative, last one 2 weeks prior to visit.  Pt denies fever, cp, abdominal pain, chills, cough, diarrhea or constipation, nausea, vomiting and blurred vision.     Updated Prior Medication List: DIAZEPAM 2 MG  TABS (DIAZEPAM) Take 1 tablet by mouth two times a day as needed  Current Allergies: ! CODEINE ! XANAX    Risk Factors:  Tobacco use:  current    Year started:  AT THE AGE OF 16/RESTATED AT AGE 45    Cigarettes:  Yes -- 1/4-1/2 pack(s) per day    Counseled to quit/cut down tobacco use:  yes Passive smoke exposure:  no Drug use:  no Alcohol use:  no Exercise:  yes    Times per week:  5    Type:  walking Seatbelt use:  100 %  Family History Risk Factors:    Family History of MI in females < 61 years old:  no    Family History of MI in males < 15 years old:  no  Mammogram History:    Date of Last Mammogram:  01/29/2007   Review of Systems       As per HPI.  Physical Exam  General:     alert and well-developed.   Head:     no abnormalities observed.   Eyes:     No corneal or conjunctival inflammation noted. EOMI. Perrla. Vision grossly normal. Mouth:     pharynx pink and moist.   Neck:     supple.   Breasts:     skin/areolae normal, no masses, no nipple discharge, no tenderness, and no adenopathy.   Lungs:     normal respiratory effort and normal breath sounds.   Heart:     normal rate, regular rhythm, and no murmur.   Abdomen:     soft, non-tender, normal bowel sounds, and no distention.   Msk:     no joint tenderness, no joint swelling, no joint warmth, no joint deformities, and no crepitation.   Extremities:     No clubbing, cyanosis, edema, or deformity noted with normal full range of motion of all joints.   Neurologic:     No cranial nerve deficits noted. Station and gait are normal. Plantar reflexes are down-going bilaterally. DTRs are symmetrical throughout. Sensory, motor and coordinative functions appear intact. Psych:      Cognition and judgment appear intact. Alert and cooperative with normal attention span and concentration. No apparent delusions, illusions, hallucinations, good eye contact.    Impression & Recommendations:  Problem # 1:  HEADACHE (ICD-784.0) With history, unilateral presentation, no other associated symptoms and lack of neurologic component, most likely diagnosis is migraines. Since pt reports that her HA responds to advil, will recommend to use Excedrin migraine OTC and if her condition failed to improved, pt advised to RTC.  Pt also advised to have her vision check to r/o that her headache is no related to refractive problem. No signs of allergy to explain nasal congestion and Sinusitis as cause of her pain.  Problem # 2:  POLYARTHRITIS (ICD-716.59) Pt had extensive workup in february and all her test returned negative. Pt mentioned that her pain is releived by advil and that she doesn't like to use any other medication because of drowsiness. At this point with completely normal exam, will recommend to continue using advil for pain control and in case the symptoms worsen or appears to return immideately to the clinic, in order for [place] to evaluate her and decide best treatment.  Problem # 3:  FIBROCYSTIC BREAST DISEASE (ICD-610.1) Hx of fibrocystic disease and recent normal mammogram. Pt had a normal breast exam, no lymphadenopathy, no masses, no lesions or nipple discharge. She was advised to continue performing monthly selfexam and [facility]   Complete Medication List: 1)  Diazepam 2 Mg Tabs (Diazepam) .... Take 1 tablet by mouth two times a day as needed   Patient Instructions: 1)  Please schedule a follow-up appointment in 1 month. 2)  If your systemget worse schedule a sooner appointment. (please try to follow with a doctor when you have the symptoms active,for an accurate diagnosis of your problem). 3)  Use excedrin (OTC) for  your headache. 4)  Follow your appointment with the gynecologyst. 5)  If you could be exposed to sexually transmitted diseases, you should use a condom. 6)  If you could become pregnant, take a multivitamin with folic acid every day.   ]

## 2010-05-05 NOTE — Assessment & Plan Note (Signed)
Summary: vaginal d/c,dizziness/gg   Vital Signs:  Patient Profile:   40 Years Old Female Height:     65.5 inches (166.37 cm) Weight:      154.06 pounds (70.03 kg) BMI:     25.34 Temp:     98.2 degrees F (36.78 degrees C) oral Pulse rate:   94 / minute BP sitting:   107 / 65  (right arm)  Pt. in pain?   yes    Location:   ribs, abdominal    Intensity:   6    Type:       aching-stabbing   Vitals Entered By: Angelina Ok RN (May 28, 2007 3:17 PM)              Is Patient Diabetic? No Nutritional Status BMI of 25 - 29 = overweight  Have you ever been in a relationship where you felt threatened, hurt or afraid?No   Does patient need assistance? Functional Status Self care Ambulation Normal     PCP:  Dellia Beckwith MD  Chief Complaint:  Vaginal discharge, diarrhea yesterday, bowel movements tighter, cramping in her sides lower abd pain, and eating feels full after a couple of bites pain after she urinates.  History of Present Illness: This is a 40 year old woman with a past medical history of Palpitations Multiple neurological symptoms with neg w/u MRI/MRA in march 2007 wnl except decr caliber MCA prox, distal cavernous portion of left LCA which may represent true stenosis or technique on exam. Been evaluated by Dr. Luberta Robertson  ? partial seizures H/o cervical anaplasia, CIN3 2002,S/P LEEP Rx  repeat paps neg Cervical diskectomy, Dr Venetia Maxon   who comes in for a follow-up. I have come to know Ms Belles very well over the past few years.  She is a very smart woman, but with complicated symptoms, usually unrelated to one another, with normal work-ups, and she tends to come up with her own diagnoses that she wants to be investigated for (heavy metal poisoning, arachnoiditis, etc.).  She always requests different investigations and wants full work-up, but never wants any treatment for anything.  She is not requesting pain meds or benzos (in fact she is refusing xanax for  anxiety because she says it caused her to have phantom bruising, a very very rare side effect that she saw on the internet that xanax could cause). I am worried that she may be hypochondriac and that it is starting to take over her life.  She had to drop out of a class already because she is sick too often, she failed an exam and misses class very often.  On the other hand, she often presents with symptoms that need to be evaluated in case they are real.  Like today : She is complaining of bilateral lower abdominal pain of a few days duration (although she has had this complaint multiple times in the past before), as well as decreased caliber of her stool.  She says she has 5-6 small formed thin BMs a day for past week, and usually had one normla BM a day.  Denies any hematochezia or melena, denies weight loss.   She also complains of right upper quadrant pain after eating, with early satiety and nausea. She also complains of polyuria, dysuria and suprapubic pressure.  Was treated for UTI earlier this month.    Current Allergies: ! CODEINE ! XANAX    Risk Factors:  Tobacco use:  current    Cigarettes:  Yes -- 1/4-1/2  pack(s) per day    Counseled to quit/cut down tobacco use:  yes Alcohol use:  no Exercise:  yes    Type:  walking Seatbelt use:  100 %  Mammogram History:    Date of Last Mammogram:  01/29/2007   Review of Systems  General      Complains of fatigue, malaise, and sweats.  Eyes      Complains of blurring.  CV      Complains of lightheadness and palpitations.      C/o swelling on left side of rib cage on & off, "looks like a tube going down on the side of chest", for a long time now (always gone when she tries to show me)  GI      Complains of abdominal pain, change in bowel habits, indigestion, loss of appetite, and nausea.      C/o decreased calier of stool  GU      Complains of dysuria and urinary frequency.  MS      Complains of mid back pain and  cramps.  Endo      Complains of polyuria.   Physical Exam  General:     alert, well-developed, well-nourished, and well-hydrated.   Head:     normocephalic and atraumatic.   Eyes:     vision grossly intact, pupils equal, pupils round, and pupils reactive to light.   Mouth:     pharynx pink and moist.   Neck:     No deformities, masses, or tenderness noted. Lungs:     Normal respiratory effort, chest expands symmetrically. Lungs are clear to auscultation, no crackles or wheezes. Heart:     Normal rate and regular rhythm. S1 and S2 normal without gallop, murmur, click, rub or other extra sounds. Abdomen:     soft, no distention, no masses, no guarding, no rigidity, no rebound tenderness, no hepatomegaly, no splenomegaly, bowel sounds hypoactive, RUQ tenderness, RLQ tenderness, and LLQ tenderness.      Impression & Recommendations:  Problem # 1:  ABDOMINAL PAIN RIGHT LOWER QUADRANT (ICD-789.03) Given the broad range of symptoms, I think it would be wisest to start with a CT scan of the abdomen and pelvis with by mouth and IV contrast.  If negative but symptoms persists, may want to schedule her for a pelvic and transvaginal ultrasound.    I am worried that her symptoms may be secondary to hypochondriasis, although I don't want to dismiss her symptoms right away without evaluation.  However, he multiple symptoms are impairing her life significantly (see HPI), and we may want to refer her to a psychiatrist at some point.  I am unsure how she would respond to that idea  Problem # 2:  POLYURIA (ICD-788.42) No UTI by urine dipstick.  Will send for UA and culture. Orders: T-Urinalysis ([id number]) T-Culture, Urine ([id number])   Problem # 3:  ANXIETY DISORDER (ICD-300.00) Patient refusing paxil or other SSRI, also refusing Xanax (says it gave her phantom bruising, a very very rare side effect she saw that xanax has been associated with on the internet).  She did agree to a short  trial of Valium as needed. Her updated medication list for this problem includes:    Diazepam 2 Mg Tabs (Diazepam) .Marland Kitchen... Take 1 tablet by mouth two times a day as needed   I am worried that her physical symptoms may be related to hypochondriasis, althoguh I don't want to dismiss her symptoms right away without evaluation.  Her symptoms are really  starting to affect her life significantly, so she may need to see a psychiatrist if this doesn't get better and we find nothing on work-up.  I am unsure how she would respond to that idea.  Complete Medication List: 1)  Flexeril 10 Mg Tabs (Cyclobenzaprine hcl) .... Take by mouth at bedtime for her neck spasm. 2)  Darvocet-n 100 100-650 Mg Tabs (Propoxyphene n-apap) .... Take one tablet every 6 hours as needed for pain 3)  Diazepam 2 Mg Tabs (Diazepam) .... Take 1 tablet by mouth two times a day as needed  Other Orders: Future Orders: CT with Contrast (CT w/ contrast) ... 05/31/2007   Patient Instructions: 1)  Please schedule a follow-up appointment in next week.    Prescriptions: DIAZEPAM 2 MG  TABS (DIAZEPAM) Take 1 tablet by mouth two times a day as needed  #10 x 0   Entered and Authorized by:   Dellia Beckwith MD   Signed by:   Dellia Beckwith MD on 05/28/2007   Method used:   Handwritten   RxID:   [id number]  ] Laboratory Results   Urine Tests  Date/Time Recieved: 05/28/07 3:35 PM Date/Time Reported: 05/28/07 3:37 PM GH  Routine Urinalysis   Color: yellow Appearance: Hazy Glucose: negative   (Normal Range: Negative) Bilirubin: negative   (Normal Range: Negative) Ketone: negative   (Normal Range: Negative) Spec. Gravity: 1.010   (Normal Range: 1.003-1.035) Blood: trace-intact   (Normal Range: Negative) pH: 7.0   (Normal Range: 5.0-8.0) Protein: negative   (Normal Range: Negative) Urobilinogen: 1.0   (Normal Range: 0-1) Nitrite: negative   (Normal Range: Negative) Leukocyte Esterace: negative   (Normal Range:  Negative)

## 2010-05-05 NOTE — Letter (Signed)
Summary: Handout Printed  Printed Handout:  - *Patient Instructions 

## 2010-05-05 NOTE — Miscellaneous (Signed)
Summary: HIPAA Restrictions  HIPAA Restrictions   Imported By: Florinda Marker 02/10/2008 14:53:57  _____________________________________________________________________  External Attachment:    Type:   Image     Comment:   External Document

## 2010-05-05 NOTE — Assessment & Plan Note (Signed)
Summary: follow-up headache/gg   Vital Signs:  Patient profile:   40 year old female Height:      65.5 inches Weight:      159.0 pounds BMI:     26.15 Temp:     97.6 degrees F oral Pulse rate:   84 / minute BP sitting:   101 / 65  (right arm)  Vitals Entered By: Filomena Jungling NT II (June 15, 2009 2:25 PM) CC: FOLOOW-UP HEADACHES, neck and shoulder pain, vomiting started yesterday morning Is Patient Diabetic? No Pain Assessment Patient in pain? yes     Location: neck, shoulder Intensity: 8 Type: aching Onset of pain  started yesterday morning Nutritional Status BMI of 25 - 29 = overweight  Have you ever been in a relationship where you felt threatened, hurt or afraid?No   Does patient need assistance? Functional Status Self care Ambulation Normal   Primary Care Provider:  Laren Everts MD  CC:  FOLOOW-UP HEADACHES, neck and shoulder pain, and vomiting started yesterday morning.  History of Present Illness: Victoria Hall is a 40 yo F with a h/o prior migraine HA who presents 1 day following a severe HA. She said she was at home yesterday just sitting around when all of a sudden she developed a severe HA behind her L eye. It was associated with dizziness, photophobia, and nausea, and after 2.5 hrs of this HA she began to vomit. She also reports tasting bananas and smelling strange things. She went to bed and when she woke up the HA was totally gone. The HA stayed "bad" for several hours, though it did ebb some. She does c/o pain/soreness between her neck and her R shoulder since she awoke this morning and she still has some nausea; however, the headache and other symptoms are gone. Denies fevers, chills, gait abnormalities, and numbness/tingling.   Preventive Screening-Counseling & Management  Alcohol-Tobacco     Alcohol drinks/day: rarely     Alcohol type: beer     Smoking Status: current     Smoking Cessation Counseling: yes     Packs/Day: 0.5     Year Started: AT  THE AGE OF 16/RESTATED AT AGE 50     Passive Smoke Exposure: no  Caffeine-Diet-Exercise     Does Patient Exercise: yes     Type of exercise: walking     Times/week: 5  Current Medications (verified): 1)  Advil 200 Mg Tabs (Ibuprofen) .... Take 1 Tablet By Mouth Every 6 Hours As Needed For Sinus Pain and Headache. 2)  Fluconazole 150 Mg Tabs (Fluconazole) .... Take One Tablet For Symptoms of Candidal Infection. 3)  Allergy 25 Mg Tabs (Diphenhydramine Hcl) .... Take 1 Tablet By Mouth Three Times A Day 4)  Saline Nasal Spray 0.65 % Soln (Saline) .... Three Times A Day 5)  Guaifenesin 200 Mg Tabs (Guaifenesin) .... Take 1 Tablet Every 4 Hours As Needed 6)  Diazepam 2 Mg Tabs (Diazepam) .... Take 1 Tablet Twice Daily As Needed For Muscle Aches  Allergies (verified): 1)  ! Codeine 2)  ! Xanax  Review of Systems      See HPI  Physical Exam  General:  Well-developed,well-nourished,in no acute distress; alert,appropriate and cooperative throughout examination Head:  Normocephalic and atraumatic without obvious abnormalities. No apparent alopecia or balding. Eyes:  PERRLA, EOMI Mouth:  Oral mucosa and oropharynx without lesions or exudates.  Teeth in good repair. Neck:  pain and decreased ROM when turned to R. Otherwise, normal ROM. No nuchal rigidity.  Negative Kernig's sign. Lungs:  Normal respiratory effort, chest expands symmetrically. Lungs are clear to auscultation, no crackles or wheezes. Heart:  Normal rate and regular rhythm. S1 and S2 normal without gallop, murmur, click, rub or other extra sounds. Msk:  See neck exam Neurologic:  A&O x 3. Cranial nerves II-XII tested and intact. Strength 5/5 upper extremities (lower not tested).    Impression & Recommendations:  Problem # 1:  HEADACHE (ICD-784.0) Most of her symptoms were consistent with typical migraine HA. However, the immediate-onset nature of the HA now with neck stiffness is concerning for [facility] so we ordered CT head, which  was negative. However, the concern was high enough that we proceeded with an LP, but unfortunately, despite several attempts we were unable to get into the subdural space. We will admit the patient so she can get a CT-guided LP.  Her updated medication list for this problem includes:    Advil 200 Mg Tabs (Ibuprofen) .Marland Kitchen... Take 1 tablet by mouth every 6 hours as needed for sinus pain and headache.  Orders: CT without Contrast (CT w/o contrast) T- * Misc. Laboratory test [id number]) Radiology other (Radiology Other)  Complete Medication List: 1)  Advil 200 Mg Tabs (Ibuprofen) .... Take 1 tablet by mouth every 6 hours as needed for sinus pain and headache. 2)  Fluconazole 150 Mg Tabs (Fluconazole) .... Take one tablet for symptoms of candidal infection. 3)  Allergy 25 Mg Tabs (Diphenhydramine hcl) .... Take 1 tablet by mouth three times a day 4)  Saline Nasal Spray 0.65 % Soln (Saline) .... Three times a day 5)  Guaifenesin 200 Mg Tabs (Guaifenesin) .... Take 1 tablet every 4 hours as needed 6)  Diazepam 2 Mg Tabs (Diazepam) .... Take 1 tablet twice daily as needed for muscle aches  Prevention & Chronic Care Immunizations   Influenza vaccine: Not documented    Tetanus booster: 04/03/2001: approx. date   Tetanus booster due: 04/04/2011    Pneumococcal vaccine: Not documented   Pneumococcal vaccine due: 11/17/2034  Other Screening   Pap smear: NEGATIVE FOR INTRAEPITHELIAL LESIONS OR MALIGNANCY.  (10/19/2008)   Pap smear action/deferral: Ordered  (10/19/2008)   Smoking status: current  (06/15/2009)   Smoking cessation counseling: yes  (06/15/2009)  Lipids   Total Cholesterol: 152  (10/26/2008)   LDL: 96  (10/26/2008)   LDL Direct: Not documented   HDL: 46  (10/26/2008)   Triglycerides: 52  (10/26/2008)   Lipid panel due: 11/09/2011    Process Orders Check Orders Results:     [facility]: ABN not required for this insurance Tests Sent for requisitioning (June 15, 2009 4:56 PM):     06/15/2009: [facility] -- T- * Misc. Laboratory test [phone] (signed)

## 2010-05-05 NOTE — Consult Note (Signed)
Summary: [facility] Dept.: Letter  [facility] Dept.: Letter   Imported By: Florinda Marker 03/05/2008 14:48:40  _____________________________________________________________________  External Attachment:    Type:   Image     Comment:   External Document

## 2010-05-05 NOTE — Assessment & Plan Note (Signed)
Summary: ACUTE-F/U WITH TEST RESULTS/CFB   Vital Signs:  Patient Profile:   40 Years Old Female Height:     65.5 inches (166.37 cm) Weight:      157.04 pounds (71.38 kg) BMI:     25.83 Temp:     97.3 degrees F (36.28 degrees C) oral Pulse rate:   79 / minute BP sitting:   116 / 70  (right arm)  Pt. in pain?   yes    Location:   back    Intensity:   2    Type:       aching  Vitals Entered By: Angelina Ok RN (February 07, 2008 1:40 PM)              Is Patient Diabetic? No Nutritional Status BMI of 25 - 29 = overweight  Have you ever been in a relationship where you felt threatened, hurt or afraid?No   Does patient need assistance? Functional Status Self care Ambulation Normal     PCP:  Laren Everts MD  Chief Complaint:  Follow up back pain and test results and needs pain med refill.  History of Present Illness: This is a 40  y/o woman with PMH of  Palpitations Multiple neurological symptoms with neg w/u MRI/MRA in march 2007 wnl except decr caliber MCA prox, distal cavernous portion of left LCA which may represent true stenosis or technique on exam. Been evaluated by Dr. Luberta Robertson  ? partial seizures H/o cervical anaplasia, CIN3 2002,S/P LEEP Rx  repeat paps neg Cervical diskectomy, Dr Venetia Maxon, Anterior discectomy c5-c7 Right fallopian tube removed Multiple ovarian cysts removed, for polycystic ovary Appendectomy Tonsillectomy Bunion removal Hydrosalphinx, following up with [facility] presenting to discuss the tests results that were checked last time. She is very concerned about a rash that appears approx. 4 x a year. She was told to return when has the rash to Bx it. She does not have the rash now. She had a back pain last week upper back and moved down her back. Now it is better. She would like me to give her some Darvocet as her old ones expired. Has F/C sometimes (30 min of fever 101.5), but most of the time she is 96.3. No N/V/D/C/CP/SOB. Has  dysuria after she urinates and frequent urination.  Has had 2 bruises that resolved.    Prior Medications Reviewed Using: Patient Recall  Updated Prior Medication List: DIAZEPAM 2 MG  TABS (DIAZEPAM) Take 1 tablet by mouth two times a day as needed VOLTAREN 1 % GEL (DICLOFENAC SODIUM) Apply a thin layer to involved areas every 6 hours as needed for pain. LIDOCAINE HCL 3 % CREA (LIDOCAINE HCL) Apply a thin layer to involved areas every 4-6 hours as needed for pain.  Current Allergies: ! CODEINE ! XANAX    Risk Factors:  Tobacco use:  current    Year started:  AT THE AGE OF 16/RESTATED AT AGE 2    Cigarettes:  Yes -- 1/2 pack(s) per day    Counseled to quit/cut down tobacco use:  yes Passive smoke exposure:  no Drug use:  no Alcohol use:  no Exercise:  yes    Times per week:  5    Type:  walking Seatbelt use:  100 %  Family History Risk Factors:    Family History of MI in females < 51 years old:  no    Family History of MI in males < 66 years old:  no  Mammogram History:  Date of Last Mammogram:  01/29/2007   Review of Systems       per HPI - no rash now   Physical Exam  General:     alert and well-developed.   Lungs:     normal respiratory effort, no crackles, and no wheezes.   Heart:     normal rate, regular rhythm, no murmur, and no gallop.   Abdomen:     soft, non-tender, and normal bowel sounds.   Extremities:     no edema large tattoo on left leg    Impression & Recommendations:  Problem # 1:  CONTACT DERMATITIS (ICD-692.9) No rash now. Can be on arms and lateral side of thighs, in an oblique, linear fashion. Can have it on back, mostly on lat.sides. Wonder if this is pytiriasis rosea (Christmas tree distribution rash that follows the line of Langer).  Problem # 2:  UTI (ICD-599.0) Urinary frequency and burning at the end of urination. Will check U/A.  Orders: T-Urinalysis ([id number])   Problem # 3:  DISC DISEASE, CERVICAL  (ICD-722.4) Will give her several Darvocets as she says she had some in the past and worked for her. She has a bottle of expired some at home but did not take them./  Complete Medication List: 1)  Diazepam 2 Mg Tabs (Diazepam) .... Take 1 tablet by mouth two times a day as needed 2)  Voltaren 1 % Gel (Diclofenac sodium) .... Apply a thin layer to involved areas every 6 hours as needed for pain. 3)  Lidocaine Hcl 3 % Crea (Lidocaine hcl) .... Apply a thin layer to involved areas every 4-6 hours as needed for pain. 4)  Darvocet A500 100-500 Mg Tabs (Propoxyphene n-apap) .... Take 1 tablet by mouth 4x a day as needed for pain   Patient Instructions: 1)  Please schedule a follow-up appointment in 6 months.   Prescriptions: DARVOCET A500 100-500 MG TABS (PROPOXYPHENE N-APAP) Take 1 tablet by mouth 4x a day as needed for pain  #8 x 1   Entered and Authorized by:   Carlus Pavlov MD   Signed by:   Carlus Pavlov MD on 02/07/2008   Method used:   Print then Give to Patient   RxID:   [id number]  ]

## 2010-05-05 NOTE — Assessment & Plan Note (Signed)
Summary: 2WK FU/DUGUAY/VS   Vital Signs:  Patient Profile:   40 Years Old Female Height:     65.5 inches (166.37 cm) Weight:      156.8 pounds (71.27 kg) Temp:     98.8 degrees F (37.11 degrees C) oral Pulse rate:   91 / minute BP sitting:   109 / 67  (right arm)  Pt. in pain?   no  Vitals Entered By: Krystal Eaton Duncan Dull) (May 09, 2007 9:57 AM)              Is Patient Diabetic? No  Have you ever been in a relationship where you felt threatened, hurt or afraid?No   Does patient need assistance? Functional Status Self care Ambulation Normal     PCP:  Dellia Beckwith MD  Chief Complaint:  2WK RECK-since last visit pt c/o dizzines, urinary frequency and urgency, low grade fevers, ringing in ears, metallic taste in mouth, high pitch sounds hurts head, abd pain, muscle pain and twitiches, strange leg sensations, and .  History of Present Illness: She comes in again with a plethura of complaints from dizziness, right arm weakness, polyuria, multiple joint aches and pain, and ringing in her ear.  She has had these complaints for sometime.  Including low grade fevers of 100*.  At times she has diarrhea which comes for about 2-3 days each month.    She only c/o joint stiffness after she uses her hands for quite sometime and denies early morning stiffness.  Current Allergies: ! CODEINE ! XANAX    Risk Factors:  Tobacco use:  current    Cigarettes:  Yes -- 1/4-1/2 pack(s) per day    Counseled to quit/cut down tobacco use:  yes Alcohol use:  no Exercise:  yes    Type:  walking Seatbelt use:  100 %  Mammogram History:    Date of Last Mammogram:  01/29/2007    Physical Exam  General:     alert and well-developed.   Head:     normocephalic.   Eyes:     vision grossly intact, pupils equal, pupils round, and pupils reactive to light.   Lungs:     normal respiratory effort and normal breath sounds.   Heart:     normal rate, regular rhythm, and no murmur.     Abdomen:     soft, non-tender, and normal bowel sounds.   Msk:     no joint tenderness, no joint swelling, no joint warmth, no joint deformities, and no crepitation.  There is very mild reddness overlying her MTP joints.    Impression & Recommendations:  Problem # 1:  UNSPECIFIED DISORDERS OF NERVOUS SYSTEM (ICD-349.9) She has seen a neurosurgeon for her cervical disk disease and he said there is no surgical options at this time.  Her parasthesia and weakness in her right arm are undoubtedly from the cervical disease in her neck.  We discussed that her pain may improve with time but she will probably always have some parasthesias. Orders: T-TSH [id number])   Problem # 2:  POLYARTHRITIS (ICD-716.59) I will check some labs to see if there is a possiblity of her having an underlying rheumatologic disorder.  Most of these have come back normal. Orders: T-Sed Rate (Automated) [id number]) T-Antinuclear Antib (ANA) ([id number]) T- * Misc. Laboratory test ([id number])   Problem # 3:  UTI (ICD-599.0) I will check another U/A and clx because she is having symptoms of a UTI.  Here last U/A grew out >100K  of staph and she wasn't treated.  She would perfer to check another U/A before I just treat.  Her repeat U/A was negative and I called and told her her results.  Complete Medication List: 1)  Flexeril 10 Mg Tabs (Cyclobenzaprine hcl) .... Take by mouth at bedtime for her neck spasm. 2)  Darvocet-n 100 100-650 Mg Tabs (Propoxyphene n-apap) .... Take one tablet every 6 hours as needed for pain 3)  Fluconazole 150 Mg Tabs (Fluconazole) .... Take one tablet for yeast infection.  Other Orders: T-Urinalysis ([id number]) T-Culture, Urine ([id number])   Patient Instructions: 1)  Please schedule an appointment with your primary doctor in 1 month    ]

## 2010-05-05 NOTE — Assessment & Plan Note (Signed)
Summary: headache,rash/gg   Vital Signs:  Patient Profile:   40 Years Old Female Height:     65.5 inches (166.37 cm) Weight:      157.1 pounds (71.41 kg) BMI:     25.84 Temp:     98.7 degrees F (37.06 degrees C) oral Pulse rate:   78 / minute BP sitting:   106 / 70  (right arm) Cuff size:   regular  Pt. in pain?   yes    Location:   headache    Intensity:   4  Vitals Entered By: Krystal Eaton Duncan Dull) (February 25, 2008 3:03 PM)              Is Patient Diabetic? No Nutritional Status BMI of 25 - 29 = overweight  Have you ever been in a relationship where you felt threatened, hurt or afraid?No   Does patient need assistance? Functional Status Self care Ambulation Normal     PCP:  Laren Everts MD  Chief Complaint:  c/o HA that started this morning and worse with bending over, "rash" behing left ear, and ears popping (needs MD excuse for class).  History of Present Illness: This is a  40  y/o woman with PMH of Palpitations Multiple neurological symptoms with neg w/u MRI/MRA in march 2007 wnl except decr caliber MCA prox, distal cavernous portion of left LCA which may represent true stenosis or technique on exam. Been evaluated by Dr. Luberta Robertson  ? partial seizures H/o cervical anaplasia, CIN3 2002,S/P LEEP Rx  repeat paps neg Cervical diskectomy, Dr Venetia Maxon, Anterior discectomy c5-c7 Right fallopian tube removed Multiple ovarian cysts removed, for polycystic ovary Appendectomy Tonsillectomy Bunion removal Hydrosalphinx, following up with [facility] presenting for HA and rash behind her ear. She woke up with a HA (that is pounding). It is better with Advil now. She cannot tolerate light and has a high-pitched ringing sound in her ear. She had HAs before, this is worse if she bends over or lay down. She has a h/o migraines but then she has visual signs and feels different (aura), lying in bed with lights off. The last one that she had one was a few years go.  The HA now feels nothing like that. She also feels an itching/scratching sensation inside her throat. She goes to school and people were sick there. No fever/but feels chills. No vomitting, but nausea. Her vision seems foggy (no double vision, blurry vision). No SOB, CP.       Current Allergies (reviewed today): ! CODEINE ! XANAX    Risk Factors:  Tobacco use:  current    Year started:  AT THE AGE OF 16/RESTATED AT AGE 38    Cigarettes:  Yes -- 1/2 pack(s) per day    Counseled to quit/cut down tobacco use:  yes Passive smoke exposure:  no Drug use:  no Alcohol use:  no Exercise:  yes    Times per week:  5    Type:  walking Seatbelt use:  100 %  Family History Risk Factors:    Family History of MI in females < 9 years old:  no    Family History of MI in males < 25 years old:  no  Mammogram History:    Date of Last Mammogram:  01/29/2007   Review of Systems       per HPI   Physical Exam  General:     alert and well-developed.   Eyes:     vision grossly intact, pupils  equal, pupils round, and pupils reactive to light.   Mouth:     pharynx a little erythematous, mucosa very dry, no tonsillomegaly Lungs:     normal respiratory effort, no crackles, and no wheezes.   Heart:     normal rate, regular rhythm, no murmur, and no gallop.   Extremities:     no edema Neurologic:     alert & oriented X3, cranial nerves II-XII intact, strength normal in all extremities, sensation intact to light touch, and finger-to-nose normal.      Impression & Recommendations:  Problem # 1:  HEADACHE (ICD-784.0) Assessment: Deteriorated The pt's pain really seems like a tension HA (see HPI) and I think pt is developping a cold and she is dehydrated (physical exam supports this, her buccal mucosa looks really dry and her BP is lower that before). Explained to pt that she needs to drink at least 1.5 L fluids a day. She can also start taking Sudafed and Cepacol for sore throat if she  develops one. Tylenol and Advil can calm her HA. Pt agrees to plan.  Her updated medication list for this problem includes:    Darvocet A500 100-500 Mg Tabs (Propoxyphene n-apap) .Marland Kitchen... Take 1 tablet by mouth 4x a day as needed for pain  Pt also complains of rash behind the ear - I will not even add it as a problem as it is a small pimple.  Complete Medication List: 1)  Diazepam 2 Mg Tabs (Diazepam) .... Take 1 tablet by mouth two times a day as needed 2)  Voltaren 1 % Gel (Diclofenac sodium) .... Apply a thin layer to involved areas every 6 hours as needed for pain. 3)  Lidocaine Hcl 3 % Crea (Lidocaine hcl) .... Apply a thin layer to involved areas every 4-6 hours as needed for pain. 4)  Darvocet A500 100-500 Mg Tabs (Propoxyphene n-apap) .... Take 1 tablet by mouth 4x a day as needed for pain   Patient Instructions: 1)  Please schedule a follow-up appointment in 6 months. 2)  Try to drink at least 1.5 L fluids a day.   ]

## 2010-05-05 NOTE — Assessment & Plan Note (Signed)
Summary: dizziness/gg   Vital Signs:  Patient Profile:   40 Years Old Female Height:     65.5 inches (166.37 cm) Weight:      152.9 pounds (69.50 kg) BMI:     25.15 Temp:     97.9 degrees F (36.61 degrees C) oral Pulse rate:   100 / minute BP sitting:   114 / 78  (right arm)  Pt. in pain?   yes    Location:   under rt breast    Intensity:   3/4    Type:       sharp  Vitals Entered By: Chinita Pester RN (May 13, 2007 9:13 AM)              Nutritional Status BMI of 25 - 29 = overweight  Have you ever been in a relationship where you felt threatened, hurt or afraid?No   Does patient need assistance? Functional Status Self care Ambulation Normal     PCP:  Dellia Beckwith MD  Chief Complaint:  bloody discharge; intermittent low abd pain;  urine frequency;  dizziness; fatigue.  History of Present Illness: 40 year old female with Past Medical History of: Palpitations, Multiple neurological symptoms with neg w/u MRI/MRA in march 2007 wnl except decr caliber MCA prox. Who came to the [facility] today complaining of abd. pain (6-7/10 in intensity, intermittent, no radiations, localized in low middle abdomen, allviated by standing upand aggravated by sitting down), low subjective fever, and pink vaginal discharge for 3 days prio to visit. Pt also complain of left chest wall pain since friday, 6/10 in intensity, fixed, no radiated,aggravatted by moving.  Current Allergies: ! CODEINE ! XANAX Updated/Current Medications (including changes made in today's visit):  FLEXERIL 10 MG TABS (CYCLOBENZAPRINE HCL) take by mouth at bedtime for her neck spasm. DARVOCET-N 100 100-650 MG  TABS (PROPOXYPHENE N-APAP) Take one tablet every 6 hours as needed for pain FLUCONAZOLE 150 MG  TABS (FLUCONAZOLE) Take one tablet for yeast infection. ULTRAM 50 MG  TABS (TRAMADOL HCL) take one tablet every 6 hours as needed for pain. CIPRO 500 MG  TABS (CIPROFLOXACIN HCL) Take 1 tablet by mouth two times a  day FLUCONAZOLE 200 MG  TABS (FLUCONAZOLE) Take 1 tablet by mouth, single dose.   Past Medical History:    Reviewed history from 11/07/2006 and no changes required:       Palpitations       Multiple neurological symptoms with neg w/u MRI/MRA in march 2007 wnl except decr caliber MCA prox, distal cavernous portion of left LCA which may represent true stenosis or technique on exam.       Been evaluated by Dr. Luberta Robertson        ? partial seizures       H/o cervical anaplasia, CIN3 2002,S/P LEEP Rx  repeat paps neg       Cervical diskectomy, Dr Venetia Maxon    Risk Factors:  Tobacco use:  current    Cigarettes:  Yes -- 1/4-1/2 pack(s) per day    Counseled to quit/cut down tobacco use:  yes Alcohol use:  no Exercise:  yes    Type:  walking Seatbelt use:  100 %  Mammogram History:    Date of Last Mammogram:  01/29/2007   Review of Systems       See HPI.   Physical Exam  General:     Well-developed,well-nourished,in no acute distress; alert,appropriate and cooperative throughout examination Head:     Normocephalic and atraumatic without  obvious abnormalities. No apparent alopecia or balding. Eyes:     No corneal or conjunctival inflammation noted. EOMI. Perrla. Funduscopic exam benign, without hemorrhages, exudates or papilledema. Vision grossly normal. Nose:     External nasal examination shows no deformity or inflammation. Nasal mucosa are pink and moist without lesions or exudates. Mouth:     Oral mucosa and oropharynx without lesions or exudates.  Teeth in good repair. Neck:     No deformities, masses, or tenderness noted. Chest Wall:     left chest wall tenderness, no deformities and no mass. Lungs:     Normal respiratory effort, chest expands symmetrically. Lungs are clear to auscultation, no crackles or wheezes. Heart:     Tachycardic, with regular rhythm. S1 and S2 normal without gallop, murmur, click, rub or other extra sounds. Abdomen:     Bowel sounds positive,abdomen  soft and non-tender without masses, organomegaly or hernias noted. Genitalia:     normal introitus, mucosa pink and moist, no vaginal or cervical lesions, no friaility or hemorrhage, and positive mucous vaginal discharge.   Extremities:     No clubbing, cyanosis, edema, or deformity noted with normal full range of motion of all joints.   Neurologic:     No cranial nerve deficits noted. Station and gait are normal. Plantar reflexes are down-going bilaterally. DTRs are symmetrical throughout. Sensory, motor and coordinative functions appear intact. Cervical Nodes:     No lymphadenopathy noted Psych:     Cognition and judgment appear intact. Alert and cooperative with normal attention span and concentration. No apparent delusions, illusions, hallucinations    Impression & Recommendations:  Problem # 1:  VAGINAL DISCHARGE (ICD-623.5) Pt complaining of vaginal discharge, low-mid abd. pain and subjective fever; I performed a plevic exam (no cervical motion tenderness), I order a wet prep and GC culture to r/o any std's or vaginal infection. I also order WBC's with diff and urianalysis, awaiting for results in order to prescribe Abx if needed.  Her urinalysis was positive,will treat with ciprofloxacin 500mg  two times a day for 7 days.Her wet prep was positive for candida, will give difluca 150mg  once.   Problem # 2:  UNSPECIFIED DISORDERS OF NERVOUS SYSTEM (ICD-349.9) Today will check antibodies for celiac disease, that will explain diarrhea and stool caliber changes. Also due to vitamins deficiency and malabsorption related to the disease will be explain her muscle spasm and neurological findings. Orders: T-Comprehensive Metabolic Panel [id number]) T-Sprue Panel (Celiac Disease Aby Eval) [id number])   Complete Medication List: 1)  Flexeril 10 Mg Tabs (Cyclobenzaprine hcl) .... Take by mouth at bedtime for her neck spasm. 2)  Darvocet-n 100 100-650 Mg Tabs (Propoxyphene n-apap) .... Take  one tablet every 6 hours as needed for pain 3)  Fluconazole 150 Mg Tabs (Fluconazole) .... Take one tablet for yeast infection. 4)  Ultram 50 Mg Tabs (Tramadol hcl) .... Take one tablet every 6 hours as needed for pain. 5)  Cipro 500 Mg Tabs (Ciprofloxacin hcl) .... Take 1 tablet by mouth two times a day 6)  Fluconazole 200 Mg Tabs (Fluconazole) .... Take 1 tablet by mouth, single dose.  Other Orders: T-Urinalysis ([id number]) T-GC Probe, genital [phone]) T-CBC w/Diff [phone]) T-Wet Prep (in-house) ([id number]) T-Chlamydia ([id number])   Patient Instructions: 1)  Please schedule a follow-up appointment in 3 weeks. 2)  Ultram 50 mg, take one tablet by mouth every 6 hr for pain as needed. 3)  Flexeril 10mg ,  take one tablet by mouth at bedtime for 5 days.  Prescriptions: FLEXERIL 10 MG TABS (CYCLOBENZAPRINE HCL) take by mouth at bedtime for her neck spasm.  #30 x 0   Entered and Authorized by:   Vassie Loll MD   Signed by:   Vassie Loll MD on 05/14/2007   Method used:   Electronically sent to ...       [id number]       [address]       [place], [place]  [address]       Ph: [phone] or [phone]       Fax: [phone]   RxID:   [id number] FLUCONAZOLE 200 MG  TABS (FLUCONAZOLE) Take 1 tablet by mouth, single dose.  #1 x 0   Entered and Authorized by:   Vassie Loll MD   Signed by:   Vassie Loll MD on 05/14/2007   Method used:   Electronically sent to ...       [id number]       [address]       [place], [place]  [address]       Ph: [phone] or [phone]       Fax: [phone]   RxID:   [id number] CIPRO 500 MG  TABS (CIPROFLOXACIN HCL) Take 1 tablet by mouth two times a day  #20 x 0   Entered and Authorized by:   Vassie Loll MD   Signed by:   Vassie Loll MD on 05/14/2007   Method used:   Electronically sent to ...       [id number]       [address]       [place], [place]  [address]       Ph: [phone] or [phone]       Fax: [phone]   RxID:   [id number] ULTRAM 50 MG  TABS (TRAMADOL HCL) take one tablet every 6 hours as needed for pain.  #25 x 0   Entered and Authorized by:   Vassie Loll MD   Signed by:   Vassie Loll MD on 05/13/2007   Method used:   Print then Give to Patient   RxID:   [id number]  ]

## 2010-05-05 NOTE — Assessment & Plan Note (Signed)
Summary: FU/EST/VS   Vital Signs:  Patient Profile:   40 Years Old Female Height:     65.5 inches (166.37 cm) Weight:      154.4 pounds (70.18 kg) BMI:     25.39 Temp:     97.8 degrees F (36.56 degrees C) oral Pulse rate:   90 / minute BP sitting:   121 / 75  (right arm)  Pt. in pain?   yes    Location:   low left abd    Intensity:   3  Vitals Entered By: Stanton Kidney Ditzler RN (June 06, 2007 9:47 AM)              Is Patient Diabetic? No Nutritional Status BMI of 25 - 29 = overweight Nutritional Status Detail good  Have you ever been in a relationship where you felt threatened, hurt or afraid?denies   Does patient need assistance? Functional Status Self care Ambulation Normal     PCP:  Dellia Beckwith MD  Chief Complaint:  FU and discuss cyst - leaving town. Hives and rash inside both arms - better. Vag discharge cont..  History of Present Illness: This is a 40 year old woman with a past medical history of Palpitations Multiple neurological symptoms with neg w/u MRI/MRA in march 2007 wnl except decr caliber MCA prox, distal cavernous portion of left LCA which may represent true stenosis or technique on exam. Been evaluated by Dr. Luberta Robertson  ? partial seizures H/o cervical anaplasia, CIN3 2002,S/P LEEP Rx  repeat paps neg Cervical diskectomy, Dr Venetia Maxon   who is coming to discuss the result of her CT-Scan.  SHe has no new complaints except a rash that was on her arms this morning but is now gone.    Prior Medication List:  FLEXERIL 10 MG TABS (CYCLOBENZAPRINE HCL) take by mouth at bedtime for her neck spasm. DARVOCET-N 100 100-650 MG  TABS (PROPOXYPHENE N-APAP) Take one tablet every 6 hours as needed for pain DIAZEPAM 2 MG  TABS (DIAZEPAM) Take 1 tablet by mouth two times a day as needed   Current Allergies (reviewed today): ! CODEINE ! XANAX     Review of Systems  The patient denies fever, decreased hearing, chest pain, dyspnea on exhertion, abdominal  pain, and hematuria.     Physical Exam  General:     alert, well-developed, and well-nourished.   Head:     normocephalic and atraumatic.   Eyes:     vision grossly intact, pupils equal, pupils round, and pupils reactive to light.   Ears:     R ear normal and L ear normal.   Mouth:     good dentition and pharynx pink and moist.   Neck:     No deformities, masses, or tenderness noted. Lungs:     Normal respiratory effort, chest expands symmetrically. Lungs are clear to auscultation, no crackles or wheezes. Heart:     Normal rate and regular rhythm. S1 and S2 normal without gallop, murmur, click, rub or other extra sounds.    Impression & Recommendations:  Problem # 1:  OVARIAN CYST (ICD-620.2) CT results explained to her, with the possibily of hydrosalpynx.  Wil refer her for pelvic and transvaginal ultrasound and to see a GYN for evaluation and treatment. Orders: Gynecologic Referral (Gyn) Ultrasound (Ultrasound)   Problem # 2:  ANXIETY DISORDER (ICD-300.00) Diazepam helping.  COntinue as is. Her updated medication list for this problem includes:    Diazepam 2 Mg Tabs (Diazepam) .Marland KitchenMarland KitchenMarland KitchenMarland Kitchen  Take 1 tablet by mouth two times a day as needed   Complete Medication List: 1)  Flexeril 10 Mg Tabs (Cyclobenzaprine hcl) .... Take by mouth at bedtime for her neck spasm. 2)  Darvocet-n 100 100-650 Mg Tabs (Propoxyphene n-apap) .... Take one tablet every 6 hours as needed for pain 3)  Diazepam 2 Mg Tabs (Diazepam) .... Take 1 tablet by mouth two times a day as needed   Patient Instructions: 1)  Please schedule a follow-up appointment in 6 months.    ]

## 2010-05-05 NOTE — Progress Notes (Signed)
Summary: phone/gg  Phone Note Call from Patient   Caller: Patient Summary of Call: Pt called with c/o sore throat for 5 days, low grade fever. also sinus congestion. rash to center of chest.  taking otc meds with no relief. will see todlay Initial call taken by: Merrie Roof RN,  October 01, 2008 9:28 AM

## 2010-05-05 NOTE — Assessment & Plan Note (Signed)
Summary: stiff neck/gg   Vital Signs:  Patient Profile:   40 Years Old Female Height:     65.5 inches (166.37 cm) Weight:      155.0 pounds (70.45 kg) BMI:     25.49 Temp:     97.5 degrees F (36.39 degrees C) Pulse rate:   94 / minute BP sitting:   125 / 76  (right arm)  Pt. in pain?   yes    Location:   neck    Intensity:   3-4at rest, 8-9 with movement or touch  Vitals Entered By: Dorie Rank RN (October 09, 2006 4:23 PM)              Is Patient Diabetic? No Nutritional Status BMI of 25 - 29 = overweight  Have you ever been in a relationship where you felt threatened, hurt or afraid?No   Does patient need assistance? Functional Status Self care Ambulation Normal   Chief Complaint:  neck pain.  History of Present Illness: This is a 40 year old woman with a past medical history of Multiple neurological symptoms with neg w/u MRI/MRA in march 2007 wnl except decr caliber MCA prox, distal cavernous portion of left LCA which may represent true stenosis or technique on exam. Been evaluated by Dr. Luberta Robertson  ? partial seizures H/o cervical anaplasia, CIN3 2002,S/P LEEP Rx  repeat paps neg Cervical diskectomy, Dr Venetia Maxon, 2003  who comes in with the same complaint as in 06-2006 of stiff neck and radiating tingling in her left arm.  This had gotten better since april with ibuprofen and flexeril, but has gotten much worse again 5 days ago.  She thinks she just didn't sleep very well, and the pain is not going away.  She had been referred to [facility] neurosurgery back then, and she said that when she saw him, he told her that there weren't enough images on the CD she brought and she has not seen him since. Darvocet helped in the past.  Current Allergies (reviewed today): ! CODEINE ! Prudy Feeler  Past Medical History:    Multiple neurological symptoms with neg w/u MRI/MRA in march 2007 wnl except decr caliber MCA prox, distal cavernous portion of left LCA which may represent true  stenosis or technique on exam.    Been evaluated by Dr. Luberta Robertson     ? partial seizures    H/o cervical anaplasia, CIN3 2002,S/P LEEP Rx  repeat paps neg    Cervical diskectomy, Dr Venetia Maxon    Risk Factors:  Tobacco use:  current    Cigarettes:  Yes -- 1/2 pack(s) per day   Review of Systems  The patient denies fever and weight loss.     Physical Exam  General:     alert, well-developed, well-nourished, and uncomfortable-appearing.   Head:     normocephalic and atraumatic.   Eyes:     vision grossly intact, pupils equal, pupils round, and pupils reactive to light.   Neck:     decreased ROM on flexion, extension, lateral flexion and rotation, reproducing pain in midline around C5.  Stiffness in paravertebral muscles and to a lesser extent in [facility] bilaterally. Lungs:     normal respiratory effort and normal breath sounds.   Heart:     normal rate, regular rhythm, no murmur, no gallop, and no rub.      Impression & Recommendations:  Problem # 1:  NECK PAIN, ACUTE (ICD-723.1) Since she has not been properly evaluated by a neurosurgeon  yet since the new findings on MRI, this really is what needs to happen.  I stressed the importance of this with her.  She will go to radiology to get a CD with the complete images of her MRI in 07-07-06 and bring those back to her surgeon at [place].  In the meanwhile, I will treat her pain with flexeril and darvocet. Her updated medication list for this problem includes:    Flexeril 10 Mg Tabs (Cyclobenzaprine hcl) .Marland Kitchen... Take by mouth at bedtime for her neck spasm.    Darvocet A500 100-500 Mg Tabs (Propoxyphene n-apap) .Marland Kitchen... Take 1 tablet by mouth every 6 hours as needed  Orders: MRI (MRI)   Medications Added to Medication List This Visit: 1)  Darvocet A500 100-500 Mg Tabs (Propoxyphene n-apap) .... Take 1 tablet by mouth every 6 hours as needed   Patient Instructions: 1)  Please schedule an appointment with your neurosurgeon and bring  your new images with you.    Prescriptions: DARVOCET A500 100-500 MG  TABS (PROPOXYPHENE N-APAP) Take 1 tablet by mouth every 6 hours as needed  #60 x 1   Entered and Authorized by:   Dellia Beckwith MD   Signed by:   Dellia Beckwith MD on 10/09/2006   Method used:   Print then Give to Patient   RxID:   [id number]

## 2010-05-05 NOTE — Consult Note (Signed)
Summary: Rehab Jacqulyn Liner, PT  Rehab Jacqulyn Liner, PT   Imported By: Dorice Lamas 07/30/2006 17:12:31  _____________________________________________________________________  External Attachment:    Type:   Image     Comment:   External Document

## 2010-05-05 NOTE — Assessment & Plan Note (Signed)
Summary: f/u worse, throat, resp problems/pcp-vega/hla   Vital Signs:  Patient profile:   40 year old female Height:      65.5 inches (166.37 cm) Weight:      155.3 pounds (70.59 kg) BMI:     25.54 Temp:     97.9 degrees F (36.61 degrees C) oral Pulse rate:   69 / minute BP sitting:   97 / 64  (right arm) CC: Depression Is Patient Diabetic? No Pain Assessment Patient in pain? yes     Location: right side of face Intensity: 6 Onset of pain  worse since last visit Nutritional Status BMI of 25 - 29 = overweight Nutritional Status Detail appetite ok  Have you ever been in a relationship where you felt threatened, hurt or afraid?denies   Does patient need assistance? Functional Status Self care Ambulation Normal Comments Since last visit - right side of face hurts, ears popping, right side of neck hurts,sinus drainage and hard to breath. Diarrhea ( yellow ) x 3 days 10/05/08.   Primary Care Provider:  Laren Everts MD  CC:  Depression.  History of Present Illness: This is a 40 year old woman who was recently in clinic for URI symptoms.  She was given a prescription for advil cold/sinus at that time.  Symptoms have gotten worse.  She had a palpatations and aggitation with the pseudoeffedrin in the sinus medication.  She has now had cough, copious nasal discharge, bothersom post nasal drip, sore throat and mild fevers and chills for 3 weeks.  In the past few days she has developed facial pain under the right eye.  Pain is worse with bending over, laying down or moving the head.  She thinks she can feel a bulge in her upper right mouth (outside of the teeth) which is associated.    Depression History:      The patient denies a depressed mood most of the day and a diminished interest in her usual daily activities.         Preventive Screening-Counseling & Management  Alcohol-Tobacco     Alcohol drinks/day: 0     Smoking Status: current     Smoking Cessation Counseling:  yes     Packs/Day: 1/4 ppd     Year Started: AT THE AGE OF 16/RESTATED AT AGE 76     Passive Smoke Exposure: no  Caffeine-Diet-Exercise     Does Patient Exercise: yes     Type of exercise: walking     Times/week: 5  Medications Prior to Update: 1)  Advil 200 Mg Tabs (Ibuprofen) .... Take 1 Tablet By Mouth Every 6 Hours As Needed For Sinus Pain and Headache. 2)  Advil Cold/sinus 30-200 Mg Tabs (Pseudoephedrine-Ibuprofen) .... At Bedtime 3)  Cortisporin 3.5-10000-1 Soln (Neomycin-Polymyxin-Hc) .... 4 Drops 3-4 Times A Day X10days 4)  Afrin Nasal Spray 0.05 % Soln (Oxymetazoline Hcl) .Marland Kitchen.. 1 Spray in Each Nostril Twice Daily  Allergies: 1)  ! Codeine 2)  ! Xanax  Social History: current smoker Drinks occasionally No history of drug abuse.  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 Packs/Day:  1/4 ppd  Review of Systems       per hpi  Physical Exam  General:  alert and well-developed.   Head:  normocephalic and atraumatic.   Eyes:  vision grossly intact, pupils equal, pupils round, and pupils reactive to light.  sclera are clear. Ears:  TM's are clear. Nose:  mucosal erythema and R maxillary  sinus tenderness.  clear nasal discharge Mouth:  pharynx pink and moist and fair dentition.  some posterior erythema, no post nasal drip, no exuades or blisters. Lungs:  normal respiratory effort and normal breath sounds.   Heart:  normal rate, regular rhythm, no murmur, no gallop, and no rub.   Psych:  Oriented X3, memory intact for recent and remote, normally interactive, good eye contact, and not anxious appearing.     Impression & Recommendations:  Problem # 1:  SINUSITIS (ICD-473.9) Given duration of symptoms (3 weeks) and now distinct right maxillofacial pain I think that she has a sinus infection.  Will treat with amoxicillin for 10 days.  Will try guaifenasen to thin secretions and moisten mucosa.  Will try cetirizine to decrease mucosal  inflamation.  She can continue to use saline and afrin nasal sprays.  She should NOT take pseudoeffedrin again.  She will return to clinic if symptoms persist or get worse.   The following medications were removed from the medication list:    Advil Cold/sinus 30-200 Mg Tabs (Pseudoephedrine-ibuprofen) .Marland Kitchen... At bedtime Her updated medication list for this problem includes:    Afrin Nasal Spray 0.05 % Soln (Oxymetazoline hcl) .Marland Kitchen... 1 spray in each nostril twice daily    Amoxicillin 500 Mg Caps (Amoxicillin) .Marland Kitchen... Take one tablet two times a day for 10 days.    Guaifenesin 200 Mg Tabs (Guaifenesin) .Marland Kitchen... Take one tablet three times a day to thin secretions.  Problem # 2:  Preventive Health Care (ICD-V70.0) she will keep her appointment next week for pap smear.  Complete Medication List: 1)  Advil 200 Mg Tabs (Ibuprofen) .... Take 1 tablet by mouth every 6 hours as needed for sinus pain and headache. 2)  Cortisporin 3.5-10000-1 Soln (Neomycin-polymyxin-hc) .... 4 drops 3-4 times a day x10days 3)  Afrin Nasal Spray 0.05 % Soln (Oxymetazoline hcl) .Marland Kitchen.. 1 spray in each nostril twice daily 4)  Amoxicillin 500 Mg Caps (Amoxicillin) .... Take one tablet two times a day for 10 days. 5)  Guaifenesin 200 Mg Tabs (Guaifenesin) .... Take one tablet three times a day to thin secretions. 6)  Cetirizine Hcl 10 Mg Tabs (Cetirizine hcl) .... Take one tablet daily for allergies. 7)  Fluconazole 150 Mg Tabs (Fluconazole) .... Take one tablet for symptoms of candidal infection.  Patient Instructions: 1)  You have a sinus infection.  You have new prescriptions for an antibiotic (amoxicillin), an antihistamine (cetirazine), and a medication to thin secretions (guaifenacin).  Take these as directed.  If your symptoms do not improve with in a few days please return to the clinic. 2)  Please make an appointment for your pap smear in the next month. Prescriptions: FLUCONAZOLE 150 MG TABS (FLUCONAZOLE) Take one tablet  for symptoms of candidal infection.  #1 x 1   Entered and Authorized by:   Elby Showers MD   Signed by:   Elby Showers MD on 10/15/2008   Method used:   Electronically to        [facility] [address] [phone]* (retail)       [address]       [place], [place]  [address]       Ph: [phone] or [phone]       Fax: [phone]   RxID:   [id number] CETIRIZINE HCL 10 MG TABS (CETIRIZINE HCL) Take one tablet daily for allergies.  #30 x 0   Entered and Authorized by:   Elby Showers MD   Signed  by:   Elby Showers MD on 10/15/2008   Method used:   Electronically to        [facility] [address] [phone]* (retail)       [address]       [place], [place]  [address]       Ph: [phone] or [phone]       Fax: [phone]   RxID:   [id number] GUAIFENESIN 200 MG TABS (GUAIFENESIN) Take one tablet three times a day to thin secretions.  #30 x 0   Entered and Authorized by:   Elby Showers MD   Signed by:   Elby Showers MD on 10/15/2008   Method used:   [facility]ronically to        [facility] [address] [phone]* (retail)       [address]       [place], [place]  [address]       Ph: [phone] or [phone]       Fax: [phone]   RxID:   [id number] AMOXICILLIN 500 MG CAPS (AMOXICILLIN) Take one tablet two times a day for 10 days.  #20 x 0   Entered and Authorized by:   Elby Showers MD   Signed by:   Elby Showers MD on 10/15/2008   Method used:   [facility]ronically to        [facility] [address] [phone]* (retail)       [address]       [place], [place]  [address]       Ph: [phone] or [phone]       Fax: [phone]   RxID:   [id number]   Prevention & Chronic Care Immunizations   Influenza vaccine: Not documented    Tetanus booster: 04/03/2001: approx. date   Tetanus booster due: 04/04/2011    Pneumococcal vaccine: Not documented   Pneumococcal vaccine due: 11/17/2034  Other Screening   Pap smear: Not  documented   Pap smear action/deferral: Refused  (10/01/2008)    Smoking status: current  (10/15/2008)   Smoking cessation counseling: yes  (10/15/2008)    Screening comments: will schedule pap next month  Lipids   Total Cholesterol: 155  (11/09/2006)   LDL: 96  (11/09/2006)   LDL Direct: Not documented   HDL: 45  (11/09/2006)   Triglycerides: 70  (11/09/2006)   Lipid panel due: 11/09/2011

## 2010-05-05 NOTE — Miscellaneous (Signed)
Summary: HIV Antibody  HIV Antibody   Imported By: Louretta Parma 11/30/2006 14:36:20  _____________________________________________________________________  External Attachment:    Type:   Image     Comment:   External Document

## 2010-05-05 NOTE — Assessment & Plan Note (Signed)
Summary: LAB RESULTS/PER MAMIE/VS    History of Present Illness: Patient came back in to get the results of her labs.  She said she only had one more similar episode was before but since then they haven't come back and she is not worried about them anymore.  Current Allergies: ! CODEINE ! XANAX        Impression & Recommendations:  Problem # 1:  PALPITATIONS, RECURRENT (ICD-785.1) TSH normal as well as her EKG.  I"m not sure what caused her "strange episodes" but we both discussed it and have come to the conclusion that since she has not had any more of these episodes that we'll not do any further w/u at this time.  If she continues to have these "episodes" she might benefit from an event monitor.  Since all we did was review her lab results and discuss the fact she has stopped having these "episodes" I will place this as a no charge visit. Orders: No Charge Patient Arrived ([id number])   Complete Medication List: 1)  Flexeril 10 Mg Tabs (Cyclobenzaprine hcl) .... Take by mouth at bedtime for her neck spasm. 2)  Darvocet-n 100 100-650 Mg Tabs (Propoxyphene n-apap) .... Take one tablet every 6 hours as needed for pain

## 2010-05-05 NOTE — Assessment & Plan Note (Signed)
Summary: h/a,naus,dizzy,jointpain,diarr x 24hrs/pcp-duguay/hla   Vital Signs:  Patient Profile:   40 Years Old Female Height:     65.5 inches (166.37 cm) Weight:      155.9 pounds (70.86 kg) BMI:     25.64 Temp:     99.1 degrees F (37.28 degrees C) oral Pulse rate:   111 / minute BP sitting:   121 / 74  (right arm)  Pt. in pain?   yes    Location:   rt arm/knees    Intensity:   5    Type:       aching  Vitals Entered By: Chinita Pester RN (April 22, 2007 2:10 PM)              Is Patient Diabetic? No Nutritional Status BMI of 25 - 29 = overweight  Have you ever been in a relationship where you felt threatened, hurt or afraid?No   Does patient need assistance? Functional Status Self care Ambulation Normal     PCP:  Dellia Beckwith MD  Chief Complaint:  diarrhea, sweating, dizziness , and h/a- chronic but "flared up" since yesterday.  History of Present Illness: This is a 40 year old woman with past medical history of :  Palpitations Multiple neurological symptoms with neg w/u MRI/MRA in march 2007 wnl except decr caliber MCA prox, distal cavernous portion of left LCA which may represent true stenosis or technique on exam. Been evaluated by Dr. Luberta Robertson  ? partial seizures H/o cervical anaplasia, CIN3 2002,S/P LEEP Rx  repeat paps neg Cervical diskectomy, Dr Venetia Maxon  Here today with an article about arachnoiditis which she feels may explain her various chronic symptoms.  Her symptoms include: skin rashes, neruopathic pain in the R arm, feeling of water running down her legs, diarrhea, abdomenal pain, frequent UTI's, occasional hematuria, degenerative disc disease of the spine (s/p C5-C7 fusion), vision changes, dizzy spells, chest pain, headaches, joint pains.  She reports that her brother and mother both have these symptoms as well.  She has been worked up for MS, seizures, migraines, heavy metal exposure.  When asked what she would like to focus on today, as she is  currently having diarrhea, dizzyness, headache and R arm pain she reports that she would just like to discuss the possible diagnosis of arachnoiditis.  Current Allergies: ! CODEINE ! XANAX    Risk Factors:  Tobacco use:  current    Cigarettes:  Yes -- 1/4-1/2 pack(s) per day Alcohol use:  no Exercise:  yes    Type:  walking Seatbelt use:  100 %  Mammogram History:    Date of Last Mammogram:  01/29/2007   Review of Systems  General      Complains of chills, fatigue, and sweats.  Eyes      Complains of blurring, eye pain, halos, and itching.  ENT      Complains of nosebleeds.  CV      Complains of chest pain or discomfort, leg cramps with exertion, lightheadness, and palpitations.  Resp      Complains of chest discomfort.  GI      Complains of abdominal pain, diarrhea, nausea, and vomiting.  GU      Complains of dysuria and hematuria.  MS      Complains of joint pain and muscle aches.   Physical Exam  General:     alert, normal appearance, and healthy-appearing.   Head:     normocephalic and atraumatic.      Impression &  Recommendations:  Problem # 1:  UNSPECIFIED DISORDERS OF NERVOUS SYSTEM (ICD-349.9) This is a very difficult converstation.  Victoria Hall is an intellegent, relatively healthy 40 year old woman with a variety of unrelated complaints.  She is not seeking pain medication, antidepressants, or any medication at all.  She feels that she can manage her symptoms as they come and go on their own and she is very familiar with them.  (this was referring specifically to her current diarrhea and R arm pain). She really just wants a diagnosis that will tie together her diffuse symptoms, and I am not sure there is one.  We discussed the issue, I told her I am unfamiliar with arachnoiditis, but I will look into it.  I also tried to be very frank in telling her that there may not be a diagnosis for her, that there are many things that medicine can not  explain and that we may just need to take her symptoms one at a time and do the best we can with them.  She will come back in 2 weeks to check in about her current symptoms and to discuss her diagnosis further.  Complete Medication List: 1)  Flexeril 10 Mg Tabs (Cyclobenzaprine hcl) .... Take by mouth at bedtime for her neck spasm. 2)  Darvocet-n 100 100-650 Mg Tabs (Propoxyphene n-apap) .... Take one tablet every 6 hours as needed for pain 3)  Fluconazole 150 Mg Tabs (Fluconazole) .... Take one tablet for yeast infection.   Patient Instructions: 1)  Please schedule a follow-up appointment in 2 weeks, with either Dr. Clent Ridges or Dr. Lorel Monaco.    ]  Impression & Recommendations:  Problem # 1:  UNSPECIFIED DISORDERS OF NERVOUS SYSTEM (ICD-349.9) This is a very difficult converstation.  Victoria Hall is an intellegent, relatively healthy 40 year old woman with a variety of unrelated complaints.  She is not seeking pain medication, antidepressants, or any medication at all.  She feels that she can manage her symptoms as they come and go on their own and she is very familiar with them.  (this was referring specifically to her current diarrhea and R arm pain). She really just wants a diagnosis that will tie together her diffuse symptoms, and I am not sure there is one.  We discussed the issue, I told her I am unfamiliar with arachnoiditis, but I will look into it.  I also tried to be very frank in telling her that there may not be a diagnosis for her, that there are many things that medicine can not explain and that we may just need to take her symptoms one at a time and do the best we can with them.  She will come back in 2 weeks to check in about her current symptoms and to discuss her diagnosis further.  Complete Medication List: 1)  Flexeril 10 Mg Tabs (Cyclobenzaprine hcl) .... Take by mouth at bedtime for her neck spasm. 2)  Darvocet-n 100 100-650 Mg Tabs (Propoxyphene n-apap) .... Take one tablet  every 6 hours as needed for pain 3)  Fluconazole 150 Mg Tabs (Fluconazole) .... Take one tablet for yeast infection.

## 2010-05-05 NOTE — Miscellaneous (Signed)
Summary: Hospital Discharge Update  Hospital Discharge  Date of admission:06/15/2009  Date of discharge:06/16/2009  Brief reason for admission/active problems: 1) Headache - 2/2 migraine. CT and CT guided LP both negative for hemorrhage.   Followup needed: 1) Recurrence of headache 2) Further treatment options if patient did not obtain topomax that was given on discharge. Explained to patient that topomax is for prevention of migraine headaches and not treatment during active headache.   The medication and problem lists have been updated.  Please see the dictated discharge summary for details.       Complete Medication List: 1)  Advil 200 Mg Tabs (Ibuprofen) .... Take 1 tablet by mouth every 6 hours as needed for sinus pain and headache. 2)  Topiramate 25 Mg Tabs (Topiramate) .... Take 1 tab by mouth at bedtime for 1 week, then take twice daily.   Allergies: 1)  ! Codeine 2)  ! Xanax   Patient Instructions: 1)  Please follow up with the Outpatient Clinic on June 30, 2009 at 10 am with Dr. Tobie Lords.  2)  Please take topomax as directed.  3)  Please contact clinic if symtoms worsen or if you have any side effects from medication.

## 2010-05-05 NOTE — Assessment & Plan Note (Signed)
Summary: hfu-per dr sidhu(vega)/cfb   Vital Signs:  Patient profile:   40 year old female Height:      65.5 inches (166.37 cm) Weight:      164.0 pounds (74.55 kg) BMI:     26.97 Temp:     98.0 degrees F oral Pulse rate:   84 / minute BP sitting:   111 / 63  (left arm)  Vitals Entered By: Chinita Pester RN (June 30, 2009 9:56 AM) CC: Hospital f/u- no c/o headaches/neck pain.   Sinus drainage- Claritin caused increased heartrate. Is Patient Diabetic? No Pain Assessment Patient in pain? no      Nutritional Status BMI of 25 - 29 = overweight  Have you ever been in a relationship where you felt threatened, hurt or afraid?No   Does patient need assistance? Functional Status Self care Ambulation Normal   Primary Care Provider:  Laren Everts MD  CC:  Hospital f/u- no c/o headaches/neck pain.   Sinus drainage- Claritin caused increased heartrate.Marland Kitchen  History of Present Illness: Victoria Hall is a 40 yo F with PMH below who presents for hospital f/u after a severe HA diagnosed as migraine after CT and CT-guided LP were negative for hemorrhage. She has done very well since discharge and has had no more HA. She was discharged on Topamax but said she didn't fill it given that she would be paying $30/mo for something that happens twice a year.   Her primary concern at this visit is her sinuses. She has a h/o sinusitis and says she started to have nasal drainage with yellow-green mucus and mild sinus pressure for the past few days. Denies fevers.  Depression History:      The patient denies a depressed mood most of the day and a diminished interest in her usual daily activities.         Preventive Screening-Counseling & Management  Alcohol-Tobacco     Alcohol drinks/day: rarely     Alcohol type: beer     Smoking Status: current     Smoking Cessation Counseling: yes     Packs/Day: 0.5     Year Started: AT THE AGE OF 16/RESTATED AT AGE 72     Passive Smoke Exposure:  no  Caffeine-Diet-Exercise     Does Patient Exercise: yes     Type of exercise: walking     Times/week: 5  Current Medications (verified): 1)  Advil 200 Mg Tabs (Ibuprofen) .... Take 1 Tablet By Mouth Every 6 Hours As Needed For Sinus Pain and Headache.  Allergies (verified): 1)  ! Codeine 2)  ! Xanax  Past History:  Past Medical History: Last updated: 10/14/2007 Palpitations Multiple neurological symptoms with neg w/u MRI/MRA in march 2007 wnl except decr caliber MCA prox, distal cavernous portion of left LCA which may represent true stenosis or technique on exam. Been evaluated by Dr. Luberta Robertson  ? partial seizures H/o cervical anaplasia, CIN3 2002,S/P LEEP Rx  repeat paps neg Cervical diskectomy, Dr Venetia Maxon, Anterior discectomy c5-c7 Right fallopian tube removed Multiple ovarian cysts removed, for polycystic ovary Appendectomy Tonsillectomy Bunion removal Hydrosalphinx, following up with [facility]  Family History: Last updated: 10/14/2007 Lives with adopted family. Doesn't know a lot about biological parents. ?deg joint disorders in uncles  Social History: Last updated: 10/15/2008 current smoker Drinks occasionally No history of drug abuse.  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   Risk Factors: Alcohol Use: rarely (06/30/2009) Exercise: yes (06/30/2009)  Risk Factors:  Smoking Status: current (06/30/2009) Packs/Day: 0.5 (06/30/2009) Passive Smoke Exposure: no (06/30/2009)  Review of Systems      See HPI  Physical Exam  General:  Well-developed,well-nourished,in no acute distress; alert,appropriate and cooperative throughout examination Head:  Normocephalic, atraumatic. Mild tenderness at R maxillary sinus Neurologic:  alert & oriented X3.   Psych:  Cognition and judgment appear intact. Alert and cooperative with normal attention span and concentration. No apparent delusions, illusions,  hallucinations   Impression & Recommendations:  Problem # 1:  HEADACHE (ICD-784.0) Migraine has resolved and not returned. She does not want to take Topamax as she only has 2 migraines per year. I told her to let us know if she changes her mind and we can prescribed it at that time. She agreed.  Her updated medication list for this problem includes:    Advil 200 Mg Tabs (Ibuprofen) .Marland Kitchen... Take 1 tablet by mouth every 6 hours as needed for sinus pain and headache.  Problem # 2:  SINUSITIS (ICD-473.9) Mild sinus pressure + nasal drainage, afebrile. This is likely viral. I told her to continue taking the Claritin and/or Benadryl and see how her symptoms do over the next several days. If by Monday her symptoms have not improved or if she becomes febrile, she should call the clinic and we will prescribe an antibiotic for her. She agreed.  Complete Medication List: 1)  Advil 200 Mg Tabs (Ibuprofen) .... Take 1 tablet by mouth every 6 hours as needed for sinus pain and headache.  Patient Instructions: 1)  Please schedule a follow-up appointment with your primary doctor as needed. 2)  If your sinus symptoms don't improve by Monday, give [place] a call and we'll call in an antibiotic for you. 3)  Please also let us know if you ever would like to start on Topamax and we can call that in for you as well.  Prevention & Chronic Care Immunizations   Influenza vaccine: Not documented    Tetanus booster: 04/03/2001: approx. date   Tetanus booster due: 04/04/2011    Pneumococcal vaccine: Not documented   Pneumococcal vaccine due: 11/17/2034  Other Screening   Pap smear: NEGATIVE FOR INTRAEPITHELIAL LESIONS OR MALIGNANCY.  (10/19/2008)   Pap smear action/deferral: Ordered  (10/19/2008)   Smoking status: current  (06/30/2009)   Smoking cessation counseling: yes  (06/30/2009)  Lipids   Total Cholesterol: 152  (10/26/2008)   LDL: 96  (10/26/2008)   LDL Direct: Not documented   HDL: 46   (10/26/2008)   Triglycerides: 52  (10/26/2008)   Lipid panel due: 11/09/2011

## 2010-05-05 NOTE — Progress Notes (Signed)
Summary: call/appt/hla  Phone Note Call from Patient   Reason for Call: Acute Illness Summary of Call: pt states she has had nausea, abd pain, joint pain since thursday 5/21, desires appt, given for 4pm 5/27 Initial call taken by: Marin Roberts RN,  Aug 28, 2007 10:33 AM

## 2010-05-05 NOTE — Assessment & Plan Note (Signed)
Summary: FU/VEGA/VS   Vital Signs:  Patient Profile:   40 Years Old Female Height:     65.5 inches (166.37 cm) Weight:      155.2 pounds (70.55 kg) BMI:     25.53 Temp:     97.8 degrees F (36.56 degrees C) oral Pulse rate:   81 / minute BP sitting:   107 / 74  (right arm)  Pt. in pain?   yes    Location:   stomach    Intensity:   5  Vitals Entered By: Stanton Kidney Ditzler RN (November 29, 2007 9:37 AM)              Is Patient Diabetic? No Nutritional Status BMI of 25 - 29 = overweight Nutritional Status Detail appetite good  Have you ever been in a relationship where you felt threatened, hurt or afraid?denies   Does patient need assistance? Functional Status Self care Ambulation Normal     PCP:  Laren Everts MD  Chief Complaint:  FU - no change. Stomach pains off and on, night sweats, h/a, and leg pain and reaction that upper chest gets bt red and sweats..  History of Present Illness: Victoria Hall is a 40 y/o woman with multiple physical complaints who presents to the opc for a f/u apptmt.   Victoria Hall is a young pleasant woman who basically wants to know if anything can be done about her multiple symptoms. She has had non specific complaints for over 10 years for which she has had extensive w/u (including w/u for MS): Muscle twitches and spasms, sore mouth, flushing, weakness, sensitivity to light and heat, h/a, joint pains.   Pains are interfering with school.  Only taking Valium once in a while - about twice a month.     Prior Medication List:  DIAZEPAM 2 MG  TABS (DIAZEPAM) Take 1 tablet by mouth two times a day as needed   Current Allergies (reviewed today): ! CODEINE ! XANAX   Social History:    current smoker    Drinks occasionally    No history of drug abuse.     Full time student at [employer], part time Wellsite geologist in the summertime.   Risk Factors:  Tobacco use:  current    Year started:  AT THE AGE OF 16/RESTATED AT AGE 69  Cigarettes:  Yes -- 1/2 pack(s) per day Passive smoke exposure:  no Drug use:  no Alcohol use:  no Exercise:  yes    Times per week:  5    Type:  walking Seatbelt use:  100 %  Family History Risk Factors:    Family History of MI in females < 39 years old:  no    Family History of MI in males < 24 years old:  no  Mammogram History:    Date of Last Mammogram:  01/29/2007   Review of Systems  General      Complains of fatigue, sweats, and weakness.  Eyes      Complains of blurring, eye irritation, and light sensitivity.  CV      Complains of chest pain or discomfort and palpitations.      Denies fainting and shortness of breath with exertion.  Resp      Denies shortness of breath, sputum productive, and wheezing.  GI      Complains of constipation and diarrhea.      Denies bloody stools, dark tarry stools, and vomiting.      Alternates b/w diarrhea  and constipation.  MS      Complains of joint pain and loss of strength.      Denies joint redness and joint swelling.  Derm      Complains of itching and rash.      Denies lesion(s).  Neuro      Complains of tingling and tremors.      Denies brief paralysis, disturbances in coordination, and falling down.  Psych      Denies anxiety and depression.  Heme      Denies abnormal bruising and bleeding.   Physical Exam  General:     alert, well-developed, well-nourished, and well-hydrated. Young woman in NaD. Head:     atraumatic.   Eyes:     vision grossly intact, pupils equal, pupils round, and pupils reactive to light.  Anicteric, EOMI. Ears:     no external deformities.   Nose:     no external deformity.   Mouth:     OP clear, MMM. Neck:     supple, full ROM, and no masses.   Lungs:     normal respiratory effort, no intercostal retractions, no accessory muscle use, normal breath sounds, no crackles, and no wheezes.   Heart:     normal rate, regular rhythm, no murmur, no gallop, and no rub.   Abdomen:      soft, non-tender, normal bowel sounds, no distention, no masses, no guarding, and no rigidity.   Extremities:     No e/c/c. Neurologic:     alert & oriented X3, cranial nerves II-XII intact, strength normal in all extremities, sensation intact to light touch, gait normal, and DTRs symmetrical and normal.   Skin:     no rashes and no suspicious lesions.   Cervical Nodes:     no anterior cervical adenopathy and no posterior cervical adenopathy.   Psych:     Oriented X3, memory intact for recent and remote, normally interactive, good eye contact, not anxious appearing, and not depressed appearing.      Impression & Recommendations:  Problem # 1:  POLYARTHRITIS (ICD-716.59) Cause undetermined. Pt wants to know if something can be done for her pain w/o writing her for meds that will end up sedating her. Will write her for lidocaine and diclofenac gels to see if she gets any benefit from them. I am not convinced that the patient has an actual clinical problem and from what I could tell, no actual dx was ever reached.  Problem # 2:  ANXIETY DISORDER (ICD-300.00) Although the patient does not feel anxious nor does she feel depressed, I encouraged her to try using the diazepam when she gets "flushing" reactions leading to sweating, flushing, hypersalivation. Would be curious to see if she responds to the diazepam. She has been on prozac in the past but was only on it for a few weeks after which time it was d/c'd b/c she hadn't seen any improvement in her sx. She also did do psychotherapy in the past. I am not certain what the best mgmt options are for this pt.  Her updated medication list for this problem includes:    Diazepam 2 Mg Tabs (Diazepam) .Marland Kitchen... Take 1 tablet by mouth two times a day as needed   Problem # 3:  DIARRHEA (ICD-787.91) Pt constantly alternates b/w diarrhea and constipation which makes me feel that she likely has IBS. This would certainly fit the rest of the non specific sx  she experiences.  Problem # 4:  CONTACT DERMATITIS (  ZOX-096.9) Pt does not have a rash today - whenever she does manage to come to the opc, she no longer has a rash. I encouraged her to come in whenever she actually has the rash so that she can be evaluated. A skin bx might be necessary at that time.  Complete Medication List: 1)  Diazepam 2 Mg Tabs (Diazepam) .... Take 1 tablet by mouth two times a day as needed 2)  Voltaren 1 % Gel (Diclofenac sodium) .... Apply a thin layer to involved areas every 6 hours as needed for pain. 3)  Lidocaine Hcl 3 % Crea (Lidocaine hcl) .... Apply a thin layer to involved areas every 4-6 hours as needed for pain.   Patient Instructions: 1)  If you develop a rash, make sure you come in to be seen on that same day. 2)  Try using the diclofenac (anti-inflammatory) and lidocaine (anesthetic) to see if they help with your joint pain. 3)  Try using the diazepam (Valium) when you do get flushing/sweating reactions to see if it helps. 4)  Please schedule a follow-up appointment in 3 months to follow up on your chronic problems or before as needed.    Prescriptions: LIDOCAINE HCL 3 % CREA (LIDOCAINE HCL) Apply a thin layer to involved areas every 4-6 hours as needed for pain.  #100g x 1   Entered and Authorized by:   Olene Craven MD   Signed by:   Olene Craven MD on 11/29/2007   Method used:   [facility]ronically to        [facility] [address] [phone]* (retail)       [address]       [place], [place]  [address]       Ph: [phone] or [phone]       Fax: [phone]   RxID:   [id number] VOLTAREN 1 % GEL (DICLOFENAC SODIUM) Apply a thin layer to involved areas every 6 hours as needed for pain.  #100g x 1   Entered and Authorized by:   Olene Craven MD   Signed by:   Olene Craven MD on 11/29/2007   Method used:   [facility]        [facility] [address] [phone]* (retail)       [address]       [place], [place]   [address]       Ph: [phone] or [phone]       Fax: [phone]   RxID:   [id number]  ]

## 2010-05-05 NOTE — Progress Notes (Signed)
Summary: phone/gg  Phone Note Call from Patient   Caller: Patient Summary of Call: Pt called and states her rt shoulder is lower that her left by 2 inches, she has popping to the shoulder,  pain and rt hand is cool to touch. Pt told to go to the ED now to have the shoulder evaluated and xray done. Initial call taken by: Merrie Roof RN,  July 18, 2007 9:18 AM  Follow-up for Phone Call        Thanks Inocencio Homes this was correct course of action for her. Follow-up by: Acey Lav MD,  July 18, 2007 9:22 AM

## 2010-05-05 NOTE — Assessment & Plan Note (Signed)
Summary: (ACUTE-VEGA)ADD ON S FOR RASH - NO SHOW    PCP:  Laren Everts MD   History of Present Illness: NO SHOW    Current Allergies: ! CODEINE ! XANAX        Complete Medication List: 1)  Diazepam 2 Mg Tabs (Diazepam) .... Take 1 tablet by mouth two times a day as needed 2)  Voltaren 1 % Gel (Diclofenac sodium) .... Apply a thin layer to involved areas every 6 hours as needed for pain. 3)  Lidocaine Hcl 3 % Crea (Lidocaine hcl) .... Apply a thin layer to involved areas every 4-6 hours as needed for pain.    ]

## 2010-05-05 NOTE — Progress Notes (Signed)
Summary: phone/gg  Phone Note Call from Patient   Caller: Patient Summary of Call: Pt called with c/o pain under left side of ribs, hurts to take breath up to side of neck.  Also hives on inside of  right arm with tinglilng down arm in to fingers,  no known cause. Onset at 1030 last night.  Diarrhea starting today and c/o nausea  Pt # [phone] Initial call taken by: Merrie Roof RN,  September 08, 2009 10:18 AM  Follow-up for Phone Call        Pt advised to go to ED for evaluation Dr Phillips Odor aware Follow-up by: Merrie Roof RN,  September 08, 2009 11:48 AM

## 2010-05-05 NOTE — Progress Notes (Signed)
  Phone Note Call from Patient   Summary of Call: Pt called and stated 1 week ago she had odd sensation in her abd followed by SOB,vision change and odd taste in mouth.  same sensation again on Saturday.  She would like to be seen. Made appointment for tomorrow. Initial call taken by: Merrie Roof RN,  November 06, 2006 3:14 PM

## 2010-05-05 NOTE — Assessment & Plan Note (Signed)
Summary: fri pm had beer and then had a reaction type experience/pcp-v...   Vital Signs:  Patient Profile:   40 Years Old Female Height:     65.5 inches (166.37 cm) Weight:      153.1 pounds (69.59 kg) BMI:     25.18 Temp:     97.9 degrees F Pulse rate:   108 / minute Pulse (ortho):   118 / minute BP sitting:   117 / 70  (left arm) BP standing:   107 / 77  (left arm)  Pt. in pain?   yes    Location:   left side under rib cage    Intensity:   3-4  Vitals Entered By: Dorie Rank RN (October 14, 2007 2:28 PM)              Is Patient Diabetic? No Nutritional Status BMI of 25 - 29 = overweight  Have you ever been in a relationship where you felt threatened, hurt or afraid?No   Does patient need assistance? Functional Status Self care Ambulation Normal Comments mouth and eyes seem to stay dry regardless of water intake     PCP:  Dellia Beckwith MD  Chief Complaint:  notes "bottom out" sensation random times - sometimes 30 min after eating- noted dizziness, redness, and dry mouth about 5 minutes after drinking  a beer on Friday- sensation went away a few hours but felt weak and achey the next day.  History of Present Illness: Victoria Hall out friday night with her colleagues. Drank 1 glass of draught beer. Then felt sick, had flushing, palpitations and dizziness. Felt very sick, and thirsty, drank glasses of water. Felt unwell for hours. Felt very weak. Felt "run down and beat up". No headache. No NVD, focal weakness or No loss of consciousness. Little bit of tightness on the chest, sharp pain on the neck, no breathing difficulty, mouth felt very dry. Had had similar reaction to wine earlier this year. And also one time before with beer. Feels generally very weak and is having recurrent dizzy spells. She had been investigated for dizziness and vertigo couple years ago, with equivocal MRI findings.     Prior Medication List:  DIAZEPAM 2 MG  TABS (DIAZEPAM) Take 1 tablet by mouth two times  a day as needed CIPRO 250 MG  TABS (CIPROFLOXACIN HCL) Take 1 tablet by mouth two times a day for 3 days   Current Allergies: ! CODEINE ! Prudy Feeler  Past Medical History:    Reviewed history from 11/07/2006 and no changes required:       Palpitations       Multiple neurological symptoms with neg w/u MRI/MRA in march 2007 wnl except decr caliber MCA prox, distal cavernous portion of left LCA which may represent true stenosis or technique on exam.       Been evaluated by Dr. Luberta Robertson        ? partial seizures       H/o cervical anaplasia, CIN3 2002,S/P LEEP Rx  repeat paps neg       Cervical diskectomy, Dr Venetia Maxon, Anterior discectomy c5-c7       Right fallopian tube removed       Multiple ovarian cysts removed, for polycystic ovary       Appendectomy       Tonsillectomy       Bunion removal       Hydrosalphinx, following up with [facility]   Family History:    Lives with adopted family. Doesn't  know a lot about biological parents. ?deg joint disorders in uncles  Social History:    current smoker    Drinks occasionally    No history of drug abuse.    Risk Factors: Tobacco use:  current    Cigarettes:  Yes -- 1/4-1/2 pack(s) per day Alcohol use:  no Exercise:  yes    Type:  walking Seatbelt use:  100 %  Mammogram History:    Date of Last Mammogram:  01/29/2007   Review of Systems      See HPI   Physical Exam  General:     alert and well-developed.   Head:     no abnormalities observed.   Eyes:     vision grossly intact, pupils equal, pupils round, and pupils reactive to light.   Ears:     no external deformities.   Nose:     no external erythema.   Mouth:     pharynx pink and moist.   Neck:     supple.   Lungs:     normal respiratory effort and normal breath sounds.   Heart:     normal rate, regular rhythm, and no murmur.   Abdomen:     soft, non-tender, normal bowel sounds, and no distention.      Impression & Recommendations:  Problem # 1:   ALLERGY (ICD-995.3) Assessment: Comment Only It appears that she had an intolerance/ allergic reaction to the alcoholic beverage or a preservative there in. Patient is advised to avoid alcoholic beverages. No further investivations needed at this time.  Orders: 12 Lead EKG (12 Lead EKG)   Problem # 2:  DIZZINESS (ICD-780.4) Patient feels generally very weak following the recent bad experience with alcohol. She is still feeling generalized tiredness but otherwise doesn't have significant postural drop. Her heart rate on EKG is 86 bpm with no abnormalities. Also doesn't have nystagmus or any other focal deficit to suggest any neurological abnormalities. Denies any tinnitus or nausea / vomiting. I am going to manage her symptomatically and ask her to come back to clinic if problem persists or worsens, in which case I would consider further investigation, eg, event monitor, brain imaging.   Her updated medication list for this problem includes:    Antivert 25 Mg Tabs (Meclizine hcl) .Marland Kitchen... Take 1 tablet by mouth up to three times a day, as required for dizziness.   Problem # 3:  TACHYCARDIA (ICD-785.0) Repeat heart rate and EKG normal. Her recent TSH was normal too. No further investigation at this time.   Complete Medication List: 1)  Diazepam 2 Mg Tabs (Diazepam) .... Take 1 tablet by mouth two times a day as needed 2)  Antivert 25 Mg Tabs (Meclizine hcl) .... Take 1 tablet by mouth up to three times a day, as required for dizziness.   Patient Instructions: 1)  Please schedule a follow-up appointment in 3 months, earlier if any concerns or worsening problem. 2)  Avoid alcoholic beverages.    Prescriptions: ANTIVERT 25 MG  TABS (MECLIZINE HCL) Take 1 tablet by mouth up to three times a day, as required for dizziness.  #15 x 0   Entered and Authorized by:   Zara Council MD   Signed by:   Zara Council MD on 10/14/2007   Method used:   Electronically sent to ...       [facility] [phone]*       [address]  [place], [place]  [address]       Ph: [phone] or [phone]       Fax: [phone]   RxID:   [id number]  ]

## 2010-05-05 NOTE — Progress Notes (Signed)
  Phone Note Call from Patient   Summary of Call: Pt called c/o stiff neck  5 days but today increase pain with radiation to l arm.  Denies swelling, weakness.  will see tomorrow/okay per Dr Okey Dupre Initial call taken by: Merrie Roof RN,  October 08, 2006 5:01 PM

## 2010-05-05 NOTE — Progress Notes (Signed)
Summary: refill/gg  Phone Note Refill Request  on January 26, 2009 10:09 AM  Pt request refill on diazepam 2 mg for muscle spasms.  She only uses a few time a YEAR but her 2009 rx is out of date.  Will you refill for small amount?   Method Requested: Telephone to Pharmacy Initial call taken by: Merrie Roof RN,  January 26, 2009 10:09 AM    She can take tylenol for pain. If pain needs more attention patient can schedule appointment for further evaluation.

## 2010-05-05 NOTE — Assessment & Plan Note (Signed)
Summary: sinus inf/shoulder pain/gg   Vital Signs:  Patient profile:   40 year old female Height:      65.5 inches (166.37 cm) Weight:      160.2 pounds (72.82 kg) BMI:     26.35 Temp:     97.4 degrees F (36.33 degrees C) oral Pulse rate:   86 / minute BP sitting:   106 / 72  (right arm)  Vitals Entered By: Chinita Pester RN (June 08, 2009 9:37 AM) CC: ?Sinus infection - fever/sinus drainage since Friday. Also ? pulled muscle-pain under left shooulder blade after coughing this a.m. Is Patient Diabetic? No Pain Assessment Patient in pain? yes     Location: under left shoulderblade Intensity: 6 Type: aching/sharp Onset of pain  Constant - pain level inc to #8 w/movement or coughing Nutritional Status BMI of 25 - 29 = overweight  Have you ever been in a relationship where you felt threatened, hurt or afraid?No   Does patient need assistance? Functional Status Self care Ambulation Normal   Primary Care Provider:  Laren Everts MD  CC:  ?Sinus infection - fever/sinus drainage since Friday. Also ? pulled muscle-pain under left shooulder blade after coughing this a.m..  History of Present Illness: Ms. Rajkumar is a 40 yo F with h/o recurrent allergic rhinitis who presents with yellow mucus drainage and fevers to 101 (though afebrile for past 48 hrs). She has been taking Benadryl, which does help to dry up the mucus, but she feels like it "just can't get out." Denies sinus pain or pressure. She says that guaifenesin has worked well in the past and would like a refill. She also c/o a muscle spasm that under her L shoulder blade that began this morning when she was doing something. She says it "feels like a pulled muscle." She has a h/o muscle aches and usually takes diazepam as needed for these, however she has had the same rx since 2009 and the pills have expired.  Depression History:      The patient denies a depressed mood most of the day and a diminished interest in her  usual daily activities.         Preventive Screening-Counseling & Management  Alcohol-Tobacco     Alcohol drinks/day: rarely     Alcohol type: beer     >5/day in last 3 mos:       Smoking Status: current     Smoking Cessation Counseling: yes     Packs/Day: 0.5     Year Started: AT THE AGE OF 16/RESTATED AT AGE 40     Passive Smoke Exposure: no  Caffeine-Diet-Exercise     Does Patient Exercise: yes     Type of exercise: walking     Times/week: 5  Current Medications (verified): 1)  Advil 200 Mg Tabs (Ibuprofen) .... Take 1 Tablet By Mouth Every 6 Hours As Needed For Sinus Pain and Headache. 2)  Fluconazole 150 Mg Tabs (Fluconazole) .... Take One Tablet For Symptoms of Candidal Infection. 3)  Allergy 25 Mg Tabs (Diphenhydramine Hcl) .... Take 1 Tablet By Mouth Three Times A Day 4)  Saline Nasal Spray 0.65 % Soln (Saline) .... Three Times A Day 5)  Guaifenesin 200 Mg Tabs (Guaifenesin) .... Take 1 Tablet Every 4 Hours As Needed 6)  Diazepam 2 Mg Tabs (Diazepam) .... Take 1 Tablet Twice Daily As Needed For Muscle Aches  Allergies (verified): 1)  ! Codeine 2)  ! Xanax  Past History:  Past Medical  History: Last updated: 10/14/2007 Palpitations Multiple neurological symptoms with neg w/u MRI/MRA in march 2007 wnl except decr caliber MCA prox, distal cavernous portion of left LCA which may represent true stenosis or technique on exam. Been evaluated by Dr. Luberta Robertson  ? partial seizures H/o cervical anaplasia, CIN3 2002,S/P LEEP Rx  repeat paps neg Cervical diskectomy, Dr Venetia Maxon, Anterior discectomy c5-c7 Right fallopian tube removed Multiple ovarian cysts removed, for polycystic ovary Appendectomy Tonsillectomy Bunion removal Hydrosalphinx, following up with [facility]  Family History: Last updated: 10/14/2007 Lives with adopted family. Doesn't know a lot about biological parents. ?deg joint disorders in uncles  Social History: Last updated: 10/15/2008 current  smoker Drinks occasionally No history of drug abuse.  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   Risk Factors: Alcohol Use: rarely (06/08/2009) Exercise: yes (06/08/2009)  Risk Factors: Smoking Status: current (06/08/2009) Packs/Day: 0.5 (06/08/2009) Passive Smoke Exposure: no (06/08/2009)  Social History: Packs/Day:  0.5  Review of Systems      See HPI  Physical Exam  General:  Well-developed,well-nourished,in no acute distress; alert,appropriate and cooperative throughout examination Head:  Normocephalic and atraumatic without obvious abnormalities. No apparent alopecia or balding. Msk:  Tenderness under medial L scapula. Pt has decreased ability to rotate neck 2/2 pain. Neurologic:  alert & oriented X3.   Psych:  Cognition and judgment appear intact. Alert and cooperative with normal attention span and concentration. No apparent delusions, illusions, hallucinations   Impression & Recommendations:  Problem # 1:  SINUSITIS (ICD-473.9) Her symptoms are most likely viral in etiology or allergy-mediated. She says she did have to take an abx for one prior sinus infection but usually guaifenesin and Benadryl help. She asked whether or not she could take both Benadryl and Claritin at the same time, and I said yes (after looking it up), but I suggested taking Claritin in AM and Benadryl at bedtime since it can be sedating. I also prescribed guaifenesin despite limited studies proving efficacy because pt reported benefit from it. I told her to call the clinic if her symptoms worsen or do not improve.  The following medications were removed from the medication list:    Amoxicillin 500 Mg Caps (Amoxicillin) .Marland Kitchen... Take one tablet two times a day for 10 days.    Guaifenesin 200 Mg Tabs (Guaifenesin) .Marland Kitchen... Take one tablet three times a day to thin secretions. Her updated medication list for this problem includes:    Saline Nasal Spray 0.65 % Soln  (Saline) .Marland Kitchen... Three times a day    Guaifenesin 200 Mg Tabs (Guaifenesin) .Marland Kitchen... Take 1 tablet every 4 hours as needed  Problem # 2:  MUSCLE SPASM, BACK (ICD-724.8) Pt's symptoms are c/w muscle spasm. I told her we could try a few different things, including local injection (as she was very uncomfortable and it was in one pinpoint location) or muscle relaxant, but she preferred conservative therapy. I told her it would work itself out over the next few days and to try Ibuprofen and heating pad. She agreed to try this. I did also refill her diazepam as she said it helps with her muscle aches (and she rarely actually takes it).   Her updated medication list for this problem includes:    Advil 200 Mg Tabs (Ibuprofen) .Marland Kitchen... Take 1 tablet by mouth every 6 hours as needed for sinus pain and headache.  Complete Medication List: 1)  Advil 200 Mg Tabs (Ibuprofen) .... Take 1 tablet by mouth every 6 hours  as needed for sinus pain and headache. 2)  Fluconazole 150 Mg Tabs (Fluconazole) .... Take one tablet for symptoms of candidal infection. 3)  Allergy 25 Mg Tabs (Diphenhydramine hcl) .... Take 1 tablet by mouth three times a day 4)  Saline Nasal Spray 0.65 % Soln (Saline) .... Three times a day 5)  Guaifenesin 200 Mg Tabs (Guaifenesin) .... Take 1 tablet every 4 hours as needed 6)  Diazepam 2 Mg Tabs (Diazepam) .... Take 1 tablet twice daily as needed for muscle aches  Patient Instructions: 1)  Please schedule a follow-up appointment with your primary doctor in 3 months. 2)  For your sinus congestion, I have prescribed guaifenesin.  3)  I have also prescribed diazepam for your muscle aches. Please only take this as needed and as directed. 4)  It is okay to take Claritin and Benadryl. I would suggest Claritin in the morning and Benadryl at bedtime as the Benadryl is sedating. Prescriptions: DIAZEPAM 2 MG TABS (DIAZEPAM) take 1 tablet twice daily as needed for muscle aches  #60 x 0   Entered and  Authorized by:   Silvestre Gunner MD   Signed by:   Silvestre Gunner MD on 06/08/2009   Method used:   Print then Give to Patient   RxID:   [id number] DIAZEPAM 2 MG TABS (DIAZEPAM) take 1 tablet twice daily as needed for muscle aches  #60 x 0   Entered and Authorized by:   Silvestre Gunner MD   Signed by:   Silvestre Gunner MD on 06/08/2009   Method used:   Print then Give to Patient   RxID:   [id number] GUAIFENESIN 200 MG TABS (GUAIFENESIN) take 1 tablet every 4 hours as needed  #60 x 0   Entered and Authorized by:   Silvestre Gunner MD   Signed by:   Silvestre Gunner MD on 06/08/2009   Method used:   Electronically to        [facility]  [address] [phone]* (retail)       [address]       [place], [place]  [address]       Ph: [phone] or [phone]       Fax: [phone]   RxID:   [id number]    Prevention & Chronic Care Immunizations   Influenza vaccine: Not documented    Tetanus booster: 04/03/2001: approx. date   Tetanus booster due: 04/04/2011    Pneumococcal vaccine: Not documented   Pneumococcal vaccine due: 11/17/2034  Other Screening   Pap smear: NEGATIVE FOR INTRAEPITHELIAL LESIONS OR MALIGNANCY.  (10/19/2008)   Pap smear action/deferral: Ordered  (10/19/2008)   Smoking status: current  (06/08/2009)   Smoking cessation counseling: yes  (06/08/2009)  Lipids   Total Cholesterol: 152  (10/26/2008)   LDL: 96  (10/26/2008)   LDL Direct: Not documented   HDL: 46  (10/26/2008)   Triglycerides: 52  (10/26/2008)   Lipid panel due: 11/09/2011      Resource handout printed.

## 2010-05-05 NOTE — Miscellaneous (Signed)
Summary: Medical Surgical Procedures  Medical Surgical Procedures   Imported By: Florinda Marker 06/23/2009 10:26:59  _____________________________________________________________________  External Attachment:    Type:   Image     Comment:   External Document

## 2010-05-05 NOTE — Progress Notes (Signed)
Summary: Rash  Phone Note Call from Patient   Caller: Patient Call For: Laren Everts MD Summary of Call: Pt says that when she saw Dr. Gwenlyn Perking for a rash he had asked that she come in when the rash reappears for possible biopsy.  Rash is on both flank areas. Painful to a number 8 at its worse.  It is a qick sharp pain and goes and comes.  Has scares from previous rashes.  Would like to be seen today.Angelina Ok RN  December 24, 2007 9:27 AM  Initial call taken by: Angelina Ok RN,  December 24, 2007 9:28 AM  Follow-up for Phone Call        worked in

## 2010-05-05 NOTE — Progress Notes (Signed)
Summary: question/gg  Phone Note From Pharmacy   Summary of Call: Pharmacy called and wanted to change Darvocet A500 to Darvocet n 100 because  the one ordered is $65  more........  is it okay to switch? [phone] Initial call taken by: Merrie Roof RN,  October 10, 2006 10:13 AM  Follow-up for Phone Call        Prescription resent. Patient given N-100/650 because less expensive.  Follow-up by: Eliseo Gum MD,  October 10, 2006 10:42 AM  Additional Follow-up for Phone Call Additional follow up Details #1::        Pharmacist called Additional Follow-up by: Merrie Roof RN,  October 10, 2006 12:27 PM    New/Updated Medications: DARVOCET-N 100 100-650 MG  TABS (PROPOXYPHENE N-APAP) Take one tablet every 6 hours as needed for pain  Prescriptions: DARVOCET-N 100 100-650 MG  TABS (PROPOXYPHENE N-APAP) Take one tablet every 6 hours as needed for pain  #60 x 1   Entered and Authorized by:   Eliseo Gum MD   Signed by:   Eliseo Gum MD on 10/10/2006   Method used:   Telephoned to ...         RxID:   [id number]

## 2010-05-05 NOTE — Progress Notes (Signed)
Summary: refill/gg  Phone Note Refill Request  on June 21, 2007 4:43 PM  Refills Requested: Medication #1:  DIAZEPAM 2 MG  TABS Take 1 tablet by mouth two times a day as needed.   Last Refilled: 05/28/2007 Initial call taken by: Merrie Roof RN,  June 21, 2007 4:43 PM  Follow-up for Phone Call        Prescription done and printed.  WIll give to triage. Follow-up by: Dellia Beckwith MD,  June 24, 2007 9:17 AM  Additional Follow-up for Phone Call Additional follow up Details #1::        Rx faxed to pharmacy Additional Follow-up by: Merrie Roof RN,  June 25, 2007 9:33 AM      Prescriptions: DIAZEPAM 2 MG  TABS (DIAZEPAM) Take 1 tablet by mouth two times a day as needed  #20 x 3   Entered and Authorized by:   Dellia Beckwith MD   Signed by:   Dellia Beckwith MD on 06/24/2007   Method used:   Print then Give to Patient   RxID:   [id number]

## 2010-05-05 NOTE — Assessment & Plan Note (Signed)
Summary: sore throat/gg   Vital Signs:  Patient profile:   40 year old female Height:      65.5 inches (166.37 cm) Weight:      155.5 pounds (70.68 kg) BMI:     25.58 Temp:     97.4 degrees F Pulse rate:   96 / minute BP sitting:   116 / 72  Vitals Entered By: Dorie Rank RN (October 01, 2008 2:39 PM) CC: started sore throat Sunday morning - tried nasal sinus relief - throat and nasal sinus pain- "splotchy rash on sternum" started this AM - itching side of neck and head, Headache Is Patient Diabetic? No Pain Assessment Patient in pain? yes     Location: throat and sinuses Intensity: 6 Type: burning Onset of pain  Sunday Nutritional Status BMI of 25 - 29 = overweight  Have you ever been in a relationship where you felt threatened, hurt or afraid?No   Does patient need assistance? Functional Status Self care Ambulation Normal Comments rapid strep test done 3:10P  Dorie Rank RN  October 01, 2008 3:17 PM    Primary Care Provider:  Laren Everts MD  CC:  started sore throat Sunday morning - tried nasal sinus relief - throat and nasal sinus pain- "splotchy rash on sternum" started this AM - itching side of neck and head and Headache.  History of Present Illness: Ms. Buske is a 40 yr old female who presents to the clinic for sore throat, sinus congestion, sinus headache, itching in ears and throat that started on Sunday June 27th 2010. Pt reports taking advil on Sunday and taking night-time OTC sinus relief gel caps but has had little to no relief. Pt reports chills and low grade fever with temps around 99-100. Pt reports very little sleep and decreased energy as a result. Pt reports blotchy red rash, not itchy, located on Sternum, that appeared this morning. States that rash always appears with sore throat symptoms. Pt denies seasonal allergies. 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 Pt denies abdominal  pain, nausea or vomiting. No other c/c.  Preventive Screening-Counseling & Management  Alcohol-Tobacco     Alcohol drinks/day: 0     Smoking Status: current     Smoking Cessation Counseling: yes     Packs/Day: 0.5  -  Date:  04/03/2001    TD booster approx. date  Current Medications (verified): 1)  Advil 200 Mg Tabs (Ibuprofen) .... Take 1 Tablet By Mouth Every 6 Hours As Needed For Sinus Pain and Headache. 2)  Advil Cold/sinus 30-200 Mg Tabs (Pseudoephedrine-Ibuprofen) .... At Bedtime 3)  Cortisporin 3.5-10000-1 Soln (Neomycin-Polymyxin-Hc) .... 4 Drops 3-4 Times A Day X10days 4)  Afrin Nasal Spray 0.05 % Soln (Oxymetazoline Hcl) .Marland Kitchen.. 1 Spray in Each Nostril Twice Daily  Allergies (verified): 1)  ! Codeine 2)  ! Xanax  Social History: Packs/Day:  0.5  Review of Systems General:  Complains of chills, fatigue, fever, loss of appetite, malaise, sweats, and weakness. Eyes:  Denies discharge and itching. ENT:  Complains of difficulty swallowing, ear discharge, earache, hoarseness, nasal congestion, nosebleeds, postnasal drainage, sinus pressure, and sore throat; Pt notes pain and drainage from Right ear. . CV:  Denies chest pain or discomfort and shortness of breath with exertion. Resp:  Complains of cough and sputum productive; denies coughing up blood. GI:  Denies abdominal pain, change in bowel habits, nausea, and vomiting. GU:  Denies dysuria and  urinary frequency. MS:  Denies joint pain. Derm:  Complains of rash; denies itching.  Physical Exam  General:  alert and well-nourished.   Head:  normocephalic and atraumatic.   Eyes:  pupils equal, pupils round, and pupils reactive to light.   Ears:  L ear normal, R pinna tender, and R tragus tender.  Tympanic membrane normal bilaterally and exudates or evident swelling.  Nose:  no nasal discharge, no nasal polyps, no active bleeding or clots, and R maxillary sinus tenderness.   Mouth:  no posterior lymphoid hypertrophy and no  postnasal drip.   Neck:  supple, no masses, no thyromegaly, and no JVD.   Chest Wall:  no deformities.   Lungs:  normal breath sounds, no dullness, no fremitus, no crackles, and no wheezes.   Heart:  normal rate, regular rhythm, no murmur, no gallop, no rub, and no JVD.   Abdomen:  soft, non-tender, normal bowel sounds, and no distention.   Pulses:  R radial normal, R dorsalis pedis normal, L radial normal, and L dorsalis pedis normal.   Neurologic:  alert & oriented X3.   Skin:  local erythema along sternum and below both breasts (looks like mild candidal rash) Cervical Nodes:  no anterior cervical adenopathy and no posterior cervical adenopathy.   Psych:  good eye contact, not anxious appearing, and not depressed appearing.      Impression & Recommendations:  Problem # 1:  URI (ICD-465.9) URI likely viral in nature. Rapid Strep Test was negative. Absence of tonsillar exudates, absence of cervical lymphadenopathy, objective fever, and presence of cough support Viral URI. Plan is symptomatic treatment as below.  Her updated medication list for this problem includes:    Advil 200 Mg Tabs (Ibuprofen) .Marland Kitchen... Take 1 tablet by mouth every 6 hours as needed for sinus pain and headache.    Advil Cold/sinus 30-200 Mg Tabs (Pseudoephedrine-ibuprofen) .Marland Kitchen... At bedtime    Afrin Nasal Spray 1 spray in each nostril twice daily  Patient advised to contact clinic if symptoms persist and worsen.  Problem # 2:  OTITIS EXTERNA (ICD-380.10) Mild Otitis Externa in Right ear.  Plan is treat with topical cortisporin as listed below.  Her updated medication list for this problem includes:    Cortisporin 3.5-10000-1 Soln (Neomycin-polymyxin-hc) .Marland KitchenMarland KitchenMarland KitchenMarland Kitchen 4 drops 3-4 times a day x10days  Problem # 3:  ACUTE PHARYNGITIS (ICD-462) See #1 above.  Her updated medication list for this problem includes:    Advil 200 Mg Tabs (Ibuprofen) .Marland Kitchen... Take 1 tablet by mouth every 6 hours as needed for sinus pain and  headache.  Orders: T-Culture, Rapid Strep ([id number])  Problem # 4:  SKIN RASH (ICD-782.1) Appearance suggests local candidal rash. Pt was advised to use OTC Monostat cream, and if it worsens, to contact clinic.   Complete Medication List: 1)  Advil 200 Mg Tabs (Ibuprofen) .... Take 1 tablet by mouth every 6 hours as needed for sinus pain and headache. 2)  Advil Cold/sinus 30-200 Mg Tabs (Pseudoephedrine-ibuprofen) .... At bedtime 3)  Cortisporin 3.5-10000-1 Soln (Neomycin-polymyxin-hc) .... 4 drops 3-4 times a day x10days 4)  Afrin Nasal Spray 0.05 % Soln (Oxymetazoline hcl) .Marland Kitchen.. 1 spray in each nostril twice daily    Patient Instructions: 1)  Please schedule a follow-up appointment in 2 weeks. 2)  Continue taking OTC sinus relief medication.  3)  Apply Cortisporin solution to right ear as directed. 4)  Use Afrin Nasal spray as directed.   Prevention & Chronic Care Immunizations   Influenza  vaccine: Not documented    Tetanus booster: 04/03/2001: approx. date   Tetanus booster due: 04/04/2011    Pneumococcal vaccine: Not documented   Pneumococcal vaccine due: 11/17/2034  Other Screening   Pap smear: Not documented   Pap smear action/deferral: Refused  (10/01/2008)    Smoking status: current  (10/01/2008)   Smoking cessation counseling: yes  (10/01/2008)    Screening comments: Patient agreed to return for PAP.   Lipids   Total Cholesterol: 155  (11/09/2006)   LDL: 96  (11/09/2006)   LDL Direct: Not documented   HDL: 45  (11/09/2006)   Triglycerides: 70  (11/09/2006)   Lipid panel due: 11/09/2011

## 2010-05-05 NOTE — Letter (Signed)
Summary: Lyme Disease Symptom  Lyme Disease Symptom   Imported By: Florinda Marker 12/02/2007 15:16:09  _____________________________________________________________________  External Attachment:    Type:   Image     Comment:   External Document

## 2010-05-05 NOTE — Progress Notes (Signed)
Summary: Bruising   Phone Note Call from Patient   Caller: Patient Call For: Laren Everts MD Summary of Call: Call from states she is having headaches for the past few days and unexplained bruising on her arm.  Also has a rash.  Pt was given an appointment for today to come in for evaluation. Angelina Ok RN  October 28, 2007 9:41 AM   Initial call taken by: Angelina Ok RN,  October 28, 2007 9:41 AM

## 2010-05-05 NOTE — Initial Assessments (Signed)
INTERNAL MEDICINE ADMISSION HISTORY AND PHYSICAL  First Contact: Amanjot Sidhu [phone]) Second Contact:Dr. Magick   PCP:Dr. Cena Benton  XB:[id number]  HPI: Ms. Lounsbury is a 40 yo F with a h/o prior migraine HA who presents 1 day following a severe HA. She said she was at home yesterday just sitting around when all of a sudden she developed a severe HA behind her L eye. It was associated with dizziness, photophobia, and nausea, and after 2.5 hrs of this HA she began to vomit. She also reports tasting bananas and smelling strange things. She went to bed and when she woke up the HA was totally gone. The HA stayed "bad" for several hours, though it did ebb some. She does c/o pain/soreness between her neck and her R shoulder since she awoke this morning and she still has some nausea; however, the headache and other symptoms are gone. Denies fevers, chills, gait abnormalities, and numbness/tingling.   ALLERGIES:  ! CODEINE ! XANAX   PAST MEDICAL HISTORY:  Palpitations Multiple neurological symptoms with neg w/u MRI/MRA in march 2007 wnl except decr caliber MCA prox, distal cavernous portion of left LCA which may represent true stenosis or technique on exam. Been evaluated by Dr. Luberta Robertson  ? partial seizures H/o cervical anaplasia, CIN3 2002,S/P LEEP Rx  repeat paps neg Cervical diskectomy, Dr Venetia Maxon, Anterior discectomy c5-c7 Right fallopian tube removed Multiple ovarian cysts removed, for polycystic ovary Appendectomy Tonsillectomy Bunion removal Hydrosalphinx, following up with [facility]   MEDICATIONS:  ADVIL 200 MG TABS (IBUPROFEN) Take 1 tablet by mouth every 6 hours as needed for sinus pain and headache. FLUCONAZOLE 150 MG TABS (FLUCONAZOLE) Take one tablet for symptoms of candidal infection. ALLERGY 25 MG TABS (DIPHENHYDRAMINE HCL) Take 1 tablet by mouth three times a day SALINE NASAL SPRAY 0.65 % SOLN (SALINE) three times a day GUAIFENESIN 200 MG TABS (GUAIFENESIN) take 1 tablet  every 4 hours as needed DIAZEPAM 2 MG TABS (DIAZEPAM) take 1 tablet twice daily as needed for muscle aches   SOCIAL HISTORY:  current smoker Drinks occasionally No history of drug abuse.  Has been accepted into [employer]. Is a novelist.    FAMILY HISTORY Lives with adopted family. Doesn't know a lot about biological parents. ?deg joint disorders in uncles   ROS: As per HPI Vital Signs:  Patient profile:   40 year old female Height:      65.5 inches Weight:      159.0 pounds BMI:     26.15 Temp:     97.6 degrees F oral Pulse rate:   84 / minute BP sitting:   101 / 65  (right arm)  Physical Exam  General:  Well-developed,well-nourished,in no acute distress; alert,appropriate and cooperative throughout examination Head:  Normocephalic and atraumatic without obvious abnormalities. No apparent alopecia or balding. Eyes:  PERRLA, EOMI Mouth:  Oral mucosa and oropharynx without lesions or exudates.  Teeth in good repair. Neck:  pain and decreased ROM when turned to R. Otherwise, normal ROM. No nuchal rigidity. Negative Kernig's sign. Lungs:  Normal respiratory effort, chest expands symmetrically. Lungs are clear to auscultation, no crackles or wheezes. Heart:  Normal rate and regular rhythm. S1 and S2 normal without gallop, murmur, click, rub or other extra sounds. Msk:  See neck exam Neurologic:  A&O x 3. Cranial nerves II-XII tested and intact. Strength 5/5 upper extremities (lower not tested).   LABS:  CT Head Findings: Ventricle size is normal.  Negative for intracranial hemorrhage.  Negative for infarct or mass lesion.  The calvarium is intact.   IMPRESSION: Normal study  ASSESSMENT AND PLAN:  1:  HEADACHE  Most of her symptoms were consistent with typical migraine HA. However, the immediate-onset nature of the HA now with neck stiffness is concerning for [facility] so we ordered CT head, which was negative. However, the concern was high enough that  we proceeded with an LP, but unfortunately, despite several attempts we were unable to get into the subdural space. Patient was admitted the patient so she can get a CT-guided LP. Will pursue neurology consult if LP positive. In reviewing past records, patient has history of multiple neurological complaints with negative neuro work-up. Along with LP, will also get UA and CBC.       DEPRESSION/ANXIETY:Continue home medications (Diazepam).   VTE PROPH: lovenox     Attending Physician:  I performed and/or observed a history and physical examination of the patient.  I discussed the case with the residents as noted and reviewed the residents' notes.  I agree with the findings and plan--please refer to the attending physician note for more details.  Signature   Printed Name
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
## 2010-05-05 NOTE — Assessment & Plan Note (Signed)
Summary: nausea,diar,bodyaches,hives,Lbreast/arm, per dr Meredith Pel, pcp-d...   Vital Signs:  Patient Profile:   40 Years Old Female Height:     65.5 inches (166.37 cm) Weight:      154.2 pounds Temp:     98.7 degrees F oral Pulse rate:   72 / minute BP sitting:   109 / 74  (right arm)  Pt. in pain?   no  Vitals Entered By: Filomena Jungling (April 01, 2007 11:28 AM)              Is Patient Diabetic? No  Does patient need assistance? Functional Status Self care Ambulation Normal     PCP:  Dellia Beckwith MD   History of Present Illness: She has been complaining of diarrhea for the past 4 days: initially at least "1 episode an hour" and now has essentially resolved without the use of medications. Also has a rash over her left arm pit that appears like contact dermatitis. She is using new deodorant and laundry detergent. No sick contacts.She does admit to eating a lot of "different foods" over the holidays and wonders if her diarrhea might be related to that.  Current Allergies: ! CODEINE ! XANAX    Risk Factors: Tobacco use:  current    Cigarettes:  Yes -- 1/2 pack(s) per day  Mammogram History:    Date of Last Mammogram:  01/29/2007   Review of Systems  The patient denies fever, weight loss, chest pain, dyspnea on exhertion, and abdominal pain.     Physical Exam  General:     Well-developed,well-nourished,in no acute distress; alert,appropriate and cooperative throughout examination Lungs:     Normal respiratory effort, chest expands symmetrically. Lungs are clear to auscultation, no crackles or wheezes. Heart:     Normal rate and regular rhythm. S1 and S2 normal without gallop, murmur, click, rub or other extra sounds. Abdomen:     Bowel sounds positive,abdomen soft and non-tender without masses, organomegaly or hernias noted.    Impression & Recommendations:  Problem # 1:  DIARRHEA (ICD-787.91) Is now resolving. Might have been a limiting case of food  poisoning or gastroenteritis. Have advised her to drink plenty of fluids. Will also check a BMET to make sure she does not have any electrolyte disturbances.  Orders: T-Comprehensive Metabolic Panel [id number])   Problem # 2:  CONTACT DERMATITIS (ICD-692.9) Might be related to her deodorant or laundry detergent. She may use benadryl at nighttimeto assist with the pruritus.  Complete Medication List: 1)  Flexeril 10 Mg Tabs (Cyclobenzaprine hcl) .... Take by mouth at bedtime for her neck spasm. 2)  Darvocet-n 100 100-650 Mg Tabs (Propoxyphene n-apap) .... Take one tablet every 6 hours as needed for pain   Patient Instructions: 1)  Please schedule a follow-up appointment in 1 year. 2)  Please come back sooner if the diarrhea or rash do not resolve. 3)  You may use benadryl 1 tablet at bedtime for the itchiness.    Prescriptions: FLEXERIL 10 MG TABS (CYCLOBENZAPRINE HCL) take by mouth at bedtime for her neck spasm.  #30 x 0   Entered and Authorized by:   Peggye Pitt MD   Signed by:   Peggye Pitt MD on 04/01/2007   Method used:   Electronically sent to ...       [facility]  [address] [phone]*       [address]       [place], [place]  [address]       Ph:  [phone] or [phone]       Fax: [phone]   RxID:   [id number]  ]

## 2010-05-05 NOTE — Progress Notes (Signed)
Summary: referral f/u-/ hla  Phone Note Call from Patient   Summary of Call: pt needs to talk to you about her neuro visit and about an EMG of the upper extremeties and the diag. her ph # [phone] Initial call taken by: Marin Roberts RN,  October 15, 2006 6:03 PM  Follow-up for Phone Call        Patient called : says she has already fixed the problem and will get her EMG at [place]. Follow-up by: Dellia Beckwith MD,  October 16, 2006 9:08 AM

## 2010-05-05 NOTE — Assessment & Plan Note (Signed)
Summary: frequency/abd pain/gg   Vital Signs:  Patient Profile:   40 Years Old Female Height:     65.5 inches (166.37 cm) Weight:      156.4 pounds (71.09 kg) BMI:     25.72 Temp:     98.8 degrees F (37.11 degrees C) oral Pulse rate:   83 / minute BP sitting:   117 / 70  (right arm)  Pt. in pain?   yes    Location:   rlq  abd    Intensity:   2/3    Type:       sharp  Vitals Entered By: Chinita Pester RN (April 08, 2007 2:41 PM)              Is Patient Diabetic? No  Have you ever been in a relationship where you felt threatened, hurt or afraid?No   Does patient need assistance? Functional Status Self care Ambulation Normal    t  PCP:  Dellia Beckwith MD  Chief Complaint:  RLQ abd pain since Thursday night   .  History of Present Illness: This is a 40 year old woman with past medical history of   Palpitations Multiple neurological symptoms with neg w/u MRI/MRA in march 2007 wnl except decr caliber MCA prox, distal cavernous portion of left LCA which may represent true stenosis or technique on exam. Been evaluated by Dr. Luberta Robertson  ? partial seizures H/o cervical anaplasia, CIN3 2002,S/P LEEP Rx  repeat paps neg Cervical diskectomy, Dr Venetia Maxon  Here today with RLQ abdomenal pain since thursday.  She was seen last week with diarrhea, which she reports is resolving.  No nausea or vomiting. The new pain came on suddenly, is crampy, intermittant and sharp.  Pain is worse after eating or drinking and worse after voiding the bladder.  Walking/movement does not make it worse.  Has not tried anything to make it better.  No dysuria, or hematuria.  No blood in diarrhea.  She has a history of ovarian cysts, had laporoscopic removal of a falopian tube and no cysts for years.  Has had appy.  She reports that her brother has also had diarrhea and is now having hematuria.  She also reports that her knees have been hurting, first the R now the L.  Hurts to put weight on, no swelling or  redness.    Current Allergies: ! CODEINE ! XANAX    Risk Factors:  Tobacco use:  current    Cigarettes:  Yes -- 1/2 pack(s) per day    Counseled to quit/cut down tobacco use:  yes Alcohol use:  no Exercise:  yes    Type:  walking Seatbelt use:  100 %  Mammogram History:    Date of Last Mammogram:  01/29/2007   Review of Systems  General      Denies chills, fatigue, fever, loss of appetite, malaise, and weight loss.  GI      Complains of abdominal pain, diarrhea, and gas.      Denies bloody stools, dark tarry stools, hemorrhoids, indigestion, loss of appetite, nausea, and vomiting.  GU      Denies abnormal vaginal bleeding, discharge, dysuria, genital sores, and urinary frequency.   Physical Exam  General:     alert, well-developed, and healthy-appearing.   Head:     normocephalic and atraumatic.   Eyes:     vision grossly intact, pupils equal, pupils round, and pupils reactive to light.   Ears:     R ear  normal and L ear normal.   Nose:     no external deformity.   Mouth:     good dentition and pharynx pink and moist.   Neck:     supple, full ROM, and no masses.   Chest Wall:     no deformities.   Lungs:     normal respiratory effort and normal breath sounds.   Heart:     normal rate and regular rhythm.   Abdomen:     soft, normal bowel sounds, no distention, no masses, RLQ mass, LUQ tenderness, and R flank tenderness.   Genitalia:     normal introitus, no external lesions, mucosa pink and moist, no adnexal masses or tenderness, and vaginal discharge.  Vaginal walls coated in thick white substance, cervix is coated.  No cervical tenderness.    Impression & Recommendations:  Problem # 1:  ABDOMINAL PAIN RIGHT LOWER QUADRANT (ICD-789.03) UA negative.  Abdomenal exam indicates some discomfort, but not extreme pain, even with deep palpation.  As diarrhea is resolving and there are no other gi symptoms, do not suspect a continuation of her gi  infection/food poisoning. Pain is bilateral, making ovarian cyst less likely.  (Tenderness is bilateral lower quadrants.)  Pelvic exam suggestive of candida, with thick white coating over vaginal walls and cervix.  Will await wet prep results, then call patient with appropriate treatment and follow up.   Orders: T-Urinalysis Dipstick only ([id number]) T-Urine Pregnancy (in -house) [id number]) T-Culture, Urine ([id number]) T-GC Probe, genital ([id number]) T-Chlamydia ([id number]) T-Wet Prep (in-house) ([id number])   Complete Medication List: 1)  Flexeril 10 Mg Tabs (Cyclobenzaprine hcl) .... Take by mouth at bedtime for her neck spasm. 2)  Darvocet-n 100 100-650 Mg Tabs (Propoxyphene n-apap) .... Take one tablet every 6 hours as needed for pain   Patient Instructions: 1)  I will call you in the next day or so with the results of your tests from today.    ] Laboratory Results   Urine Tests  Date/Time Recieved: 04/08/07  1545p.m. Date/Time Reported: 04/08/07  1545 P.M.  Routine Urinalysis   Glucose: negative   (Normal Range: Negative) Bilirubin: negative   (Normal Range: Negative) Ketone: negative   (Normal Range: Negative) Spec. Gravity: 1.015   (Normal Range: 1.003-1.035) Blood: negative   (Normal Range: Negative) pH: 5.5   (Normal Range: 5.0-8.0) Protein: negative   (Normal Range: Negative) Urobilinogen: negative   (Normal Range: 0-1) Nitrite: negative   (Normal Range: Negative) Leukocyte Esterace: negative   (Normal Range: Negative)        Appended Document: Orders Update    Clinical Lists Changes  Medications: Added new medication of FLUCONAZOLE 150 MG  TABS (FLUCONAZOLE) Take one tablet for yeast infection. - Signed Rx of FLUCONAZOLE 150 MG  TABS (FLUCONAZOLE) Take one tablet for yeast infection.;  #1 x 0;  Signed;  Entered by: Elby Showers MD;  Authorized by: Elby Showers MD;  Method used: Electronic    Prescriptions: FLUCONAZOLE 150 MG  TABS (FLUCONAZOLE)  Take one tablet for yeast infection.  #1 x 0   Entered and Authorized by:   Elby Showers MD   Signed by:   Elby Showers MD on 04/10/2007   Method used:   Electronically sent to ...       [facility]  [address] [phone]*       [address]       [place], [place]  [address]       Ph: [phone] or [phone]  Fax: [phone]   RxID:   [id number]

## 2010-05-05 NOTE — Assessment & Plan Note (Signed)
Summary: FU ON RASH/VEGA/PER GAYLE/VS   Vital Signs:  Patient Profile:   40 Years Old Female Height:     65.5 inches (166.37 cm) Weight:      159.6 pounds (72.55 kg) BMI:     26.25 Temp:     98.7 degrees F (37.06 degrees C) oral Pulse rate:   83 / minute BP sitting:   110 / 70  (right arm) Cuff size:   regular  Pt. in pain?   yes    Location:   R/SHOULDER-NECK    Intensity:      3    Type:       aching  Vitals Entered By: Theotis Barrio NT II (January 14, 2008 2:38 PM)                  PCP:  Laren Everts MD  Chief Complaint:  HERE TO FOLLOW UP ON RASH  /  ROUTINE VIIST / RIGHT SHOULDER -NECK PAIN.  History of Present Illness: Victoria Hall is a 40 y/o woman with multiple physical and neurological complaints who presents to the opc complaining of a skin rash and followup on her chronic sx's.   Victoria Hall is a young pleasant woman who basically wants to know if anything can be done about her multiple symptoms. She has had extensive w/u in the past (including w/u for MS); her symptoms includes: Muscle twitches and spasms, sore mouth, metal tasting, flushing, weakness, sensitivity to light and heat, h/a, joint pains, recurrent rash, nausea, vomiting, diarrhea and constipation, night-sweats and blurred vision. All this symptoms have been present for the last 10 years on/off.  Today she reported history of tick exposure in the past (4-5 years ago) and development of inttermitent  rash since then.       Prior Medications Reviewed Using: Patient Recall  Prior Medication List:  DIAZEPAM 2 MG  TABS (DIAZEPAM) Take 1 tablet by mouth two times a day as needed VOLTAREN 1 % GEL (DICLOFENAC SODIUM) Apply a thin layer to involved areas every 6 hours as needed for pain. LIDOCAINE HCL 3 % CREA (LIDOCAINE HCL) Apply a thin layer to involved areas every 4-6 hours as needed for pain.   Current Allergies: ! CODEINE ! Victoria Hall  Past Medical History:    Reviewed history from  10/14/2007 and no changes required:       Palpitations       Multiple neurological symptoms with neg w/u MRI/MRA in march 2007 wnl except decr caliber MCA prox, distal cavernous portion of left LCA which may represent true stenosis or technique on exam.       Been evaluated by Dr. Luberta Robertson        ? partial seizures       H/o cervical anaplasia, CIN3 2002,S/P LEEP Rx  repeat paps neg       Cervical diskectomy, Dr Venetia Maxon, Anterior discectomy c5-c7       Right fallopian tube removed       Multiple ovarian cysts removed, for polycystic ovary       Appendectomy       Tonsillectomy       Bunion removal       Hydrosalphinx, following up with [facility]       Review of Systems       As per HPI.   Physical Exam  General:     alert, well-developed, well-nourished, and well-hydrated. Young woman in NaD. Eyes:     vision grossly intact, pupils equal,  pupils round, and pupils reactive to light.  Anicteric, EOMI. Mouth:     Oral mucosa and oropharynx without lesions or exudates.  Teeth in good repair. Lungs:     normal respiratory effort, no intercostal retractions, no accessory muscle use, normal breath sounds, no crackles, and no wheezes.   Heart:     Normal rate and regular rhythm. S1 and S2 normal without gallop, murmur, click, rub or other extra sounds. Abdomen:     soft, non-tender, normal bowel sounds, no distention, no masses, no guarding, and no rigidity.   Neurologic:     alert & oriented X3, cranial nerves II-XII intact, strength normal in all extremities, sensation intact to light touch, gait normal, and DTRs symmetrical and normal.      Impression & Recommendations:  Problem # 1:  NAUSEA AND VOMITING (ICD-787.01) Will order a complete battery of tests that will help r/o possible causes of chronic problems with her abdominal tract. If all these testy return negative and symptoms persist will probably need to order images study if not done yet. The whole picture looks  to be 2/2 to IBS (but as exclusion dx, will r/o other causes first). Pt instructed to increase fiber ingestion in her diet.  Orders: T-Hepatitis B Surface Antigen [id number]) T-Hepatitis B Surface Antibody [id number]) T-Hepatitis B Core Antibody ([id number])   Problem # 2:  POLYARTHRITIS (ICD-716.59) Etiology unkown, after discussing with Dr. Daiva Eves and this history of tick exposure will check for rheumathology causes and also will order test to r/o lyme disease. Will encourage patient to continue using her topical lidocaine cream and prn ibuprofen.  Orders: T-Comprehensive Metabolic Panel [id number]) T-CBC w/Diff [id number]) T-Sed Rate (Automated) [id number]) T-Hepatitis C Antibody ([id number]) T-CK Total ([phone]) T- * Misc. Laboratory test [phone])   Problem # 3:  PROBLEMS W/SMELL/TASTE (ICD-V41.5) Will r/o rheumatology problems (especially lupus) and will recomend pt to use omeprazole daily, since the history of abdominal discomfort and this metal tasting could be secondary to GERD. Pt will be follow in 1 month and will reevaluate her symptoms.  Problem # 4:  PALPITATIONS, RECURRENT (ICD-785.1) Will check a TSH to evaluate thyroid function especially with history of cold and heat intolerance, depending results will start appropriate treatment. If symptoms persist and TSH unable to iluminate problem will performed EKG and if needed will order a cardiology consult for a holter test.  Orders: T-TSH ([id number])   Complete Medication List: 1)  Diazepam 2 Mg Tabs (Diazepam) .... Take 1 tablet by mouth two times a day as needed 2)  Voltaren 1 % Gel (Diclofenac sodium) .... Apply a thin layer to involved areas every 6 hours as needed for pain. 3)  Lidocaine Hcl 3 % Crea (Lidocaine hcl) .... Apply a thin layer to involved areas every 4-6 hours as needed for pain. 4)  [facility] Omeprazole 20 Mg Tbec (Omeprazole) .... Take 1 tablet by mouth once a day  Other  Orders: T-Hepatitis B Surface Antigen ([id number]) T-Hepatitis B Surface Antibody ([id number]) T-Hepatitis B Core Antibody ([id number])      Patient Instructions: 1)  Please schedule a follow-up appointment in 1 month for test results. 2)  Remember to come when rash present for biopsy. 3)  Take your medications as prescribed. 4)  Bring your medications bottle with you on your next appointment.   Prescriptions: [facility] OMEPRAZOLE 20 MG TBEC (OMEPRAZOLE) Take 1 tablet by mouth once a day  #30 x 3   Entered and Authorized by:   Vassie Loll MD  Signed by:   Vassie Loll MD on 01/14/2008   Method used:   Print then Give to Patient   RxID:   [id number]  ]

## 2010-05-05 NOTE — Progress Notes (Signed)
Summary: refill/ gg- duguay  Phone Note Refill Request Message from:  Fax from Pharmacy on June 21, 2007 4:02 PM  Refills Requested: Medication #1:  DIAZEPAM 2 MG  TABS Take 1 tablet by mouth two times a day as needed.   Last Refilled: 2/26 Initial call taken by: Merrie Roof RN,  June 21, 2007 4:03 PM  Follow-up for Phone Call        Already took care of this on previous phone note earlier today. Follow-up by: Dellia Beckwith MD,  June 24, 2007 11:34 AM

## 2010-05-05 NOTE — Letter (Signed)
Summary: [facility] Clinics: Clinic Notes  [facility] Clinics: Clinic Notes   Imported By: Florinda Marker 10/10/2007 15:14:39  _____________________________________________________________________  External Attachment:    Type:   Image     Comment:   External Document

## 2010-05-05 NOTE — Assessment & Plan Note (Signed)
Summary: nausea, abd pain, joint pain since thurs/pcp-duguay/hla   Vital Signs:  Patient Profile:   40 Years Old Female Height:     65.5 inches (166.37 cm) Weight:      153.3 pounds (69.68 kg) BMI:     25.21 Temp:     97.9 degrees F (36.61 degrees C) oral Pulse rate:   85 / minute BP sitting:   112 / 74  (right arm)  Pt. in pain?   yes    Location:   low abd    Intensity:   2  Vitals Entered By: Stanton Kidney Ditzler RN (Aug 28, 2007 3:59 PM)              Is Patient Diabetic? No Nutritional Status BMI of 25 - 29 = overweight Nutritional Status Detail down  Have you ever been in a relationship where you felt threatened, hurt or afraid?denies   Does patient need assistance? Functional Status Self care Ambulation Normal     PCP:  Dellia Beckwith MD  Chief Complaint:  White watery vag discharge - no odor or itching. Rash occ down right arm with metallic taste in mouth and pain back of neck..  History of Present Illness: This is a  40  y/o woman with PMH of  Palpitations Multiple neurological symptoms with neg w/u MRI/MRA in march 2007 wnl except decr caliber MCA prox, distal cavernous portion of left LCA which may represent true stenosis or technique on exam. Been evaluated by Dr. Luberta Robertson  ? partial seizures H/o cervical anaplasia, CIN3 2002,S/P LEEP Rx  repeat paps neg Cervical diskectomy, Dr Venetia Maxon presenting for nausea last night. Six days ago she started to have pain with urination and stomach pains in left upper quadrant and periumbilical and loss of appetite. She feels better now, no pain now but an ache,  but she is hot and dizzy. No D/C. She had no fever but had chills. No blood in stool. Has sometimes tinnitus.  She had normal LMP few weeks ago and she is not sexually active now.  She tells me that she gets a linear rash on her right arm sometimes, which is painful and burning and leaves scars. I do not see scars now, and she has no rash at this moment. I told her this is  probably Herpes Zoster.      Current Allergies (reviewed today): ! CODEINE ! XANAX    Risk Factors: Tobacco use:  current    Cigarettes:  Yes -- 1/4-1/2 pack(s) per day Alcohol use:  no Exercise:  yes    Type:  walking Seatbelt use:  100 %  Mammogram History:    Date of Last Mammogram:  01/29/2007   Review of Systems       per HPI   Physical Exam  General:     alert and well-developed.   Eyes:     vision grossly intact, pupils equal, pupils round, and pupils reactive to light.   Mouth:     pharynx pink and moist.   Lungs:     normal respiratory effort, no crackles, and no wheezes.   Heart:     normal rate, regular rhythm, and no murmur.   Abdomen:     soft, BS +, NT, ND, no guarding, no rebound, Murphy neg., mild TTP in suprapubic area and LUQ.    Impression & Recommendations:  Problem # 1:  UTI (ICD-599.0) Pt describing pain with urination and also suprapubic tenderness, most likely caused by a UTI. Will  check a U/A and start her on empiric Cipro for 3 days. Probably the nausea and abdominal pain was caused by the UTI, too. The nausea is almost gone and she only has minimal abdominal pain now.  Her updated medication list for this problem includes:    Cipro 250 Mg Tabs (Ciprofloxacin hcl) .Marland Kitchen... Take 1 tablet by mouth two times a day for 3 days  Orders: T-Urinalysis ([id number]) Est. Patient Level III (09811)  Diagnostics Reviewed:  BUN: 11 (05/13/2007)   Creatinine: 0.80 (05/13/2007)   Medications Added to Medication List This Visit: 1)  Cipro 250 Mg Tabs (Ciprofloxacin hcl) .... Take 1 tablet by mouth two times a day for 3 days   Patient Instructions: 1)  Please schedule a follow-up appointment as needed.   Prescriptions: CIPRO 250 MG  TABS (CIPROFLOXACIN HCL) Take 1 tablet by mouth two times a day for 3 days  #6 x 0   Entered and Authorized by:   Carlus Pavlov MD   Signed by:   Carlus Pavlov MD on 08/28/2007   Method used:    Electronically sent to ...       [facility] [address] [id number]       [address]       [place], [place]  [address]       Ph: [phone] or [phone]       Fax: [phone]   RxID:   [id number] DIAZEPAM 2 MG  TABS (DIAZEPAM) Take 1 tablet by mouth two times a day as needed  #20 x 3   Entered and Authorized by:   Carlus Pavlov MD   Signed by:   Manning Charity MD on 08/28/2007   Method used:   Print then Give to Patient   RxID:   [id number]  ]

## 2010-05-05 NOTE — Assessment & Plan Note (Signed)
Summary: RA/2WK F/U/VS   Vital Signs:  Patient profile:   40 year old female Height:      65.5 inches (166.37 cm) Weight:      158.5 pounds (72.05 kg) BMI:     26.07 Temp:     99.0 degrees F oral Pulse rate:   88 / minute BP sitting:   98 / 61  (right arm)  Vitals Entered By: Chinita Pester RN (October 19, 2008 2:15 PM) CC: F/U visit - states she feels better than last week; continues to meds. as prescribed.    Needs  PAP  today Is Patient Diabetic? No Pain Assessment Patient in pain? no      Nutritional Status BMI of 25 - 29 = overweight  Have you ever been in a relationship where you felt threatened, hurt or afraid?No   Does patient need assistance? Functional Status Self care Ambulation Normal   Primary Care Provider:  Laren Everts MD  CC:  F/U visit - states she feels better than last week; continues to meds. as prescribed.    Needs  PAP  today.  History of Present Illness: 40 yr old woman with pmhx as described below comes to the clinic for follow up. States the her sinus pressure symptoms are much improved. Continues to have some post nasal drip but not as much as last week. Denies fever. Responding well to treatment.  Patient is here today mainly for yearly Pap smear.    Preventive Screening-Counseling & Management  Caffeine-Diet-Exercise     Does Patient Exercise: yes     Type of exercise: walking     Times/week: 5  Problems Prior to Update: 1)  Sinusitis  (ICD-473.9) 2)  Skin Rash  (ICD-782.1) 3)  Uri  (ICD-465.9) 4)  Otitis Externa  (ICD-380.10) 5)  Acute Pharyngitis  (ICD-462) 6)  Hydrosalpinx  (ICD-614.1) 7)  Headache  (ICD-784.0) 8)  Tachycardia  (ICD-785.0) 9)  Ovarian Cyst  (ICD-620.2) 10)  Anxiety Disorder  (ICD-300.00) 11)  Vaginal Discharge  (ICD-623.5) 12)  Polyarthritis  (ICD-716.59) 13)  Unspecified Disorders of Nervous System  (ICD-349.9) 14)  Abdominal Pain Right Lower Quadrant  (ICD-789.03) 15)  Palpitations, Recurrent   (ICD-785.1) 16)  Problems W/smell/taste  (ICD-V41.5) 17)  Vertigo  (ICD-780.4) 18)  Visual Impairment  (ICD-368.10) 19)  Disturbance, Visual Nos  (ICD-368.9) 20)  Tobacco Abuse  (ICD-305.1) 21)  Fibrocystic Breast Disease  (ICD-610.1) 22)  Cervical Anaplasia  (ICD-796.2) 23)  Disc Disease, Cervical  (ICD-722.4)  Medications Prior to Update: 1)  Advil 200 Mg Tabs (Ibuprofen) .... Take 1 Tablet By Mouth Every 6 Hours As Needed For Sinus Pain and Headache. 2)  Cortisporin 3.5-10000-1 Soln (Neomycin-Polymyxin-Hc) .... 4 Drops 3-4 Times A Day X10days 3)  Afrin Nasal Spray 0.05 % Soln (Oxymetazoline Hcl) .Marland Kitchen.. 1 Spray in Each Nostril Twice Daily 4)  Amoxicillin 500 Mg Caps (Amoxicillin) .... Take One Tablet Two Times A Day For 10 Days. 5)  Guaifenesin 200 Mg Tabs (Guaifenesin) .... Take One Tablet Three Times A Day To Thin Secretions. 6)  Cetirizine Hcl 10 Mg Tabs (Cetirizine Hcl) .... Take One Tablet Daily For Allergies. 7)  Fluconazole 150 Mg Tabs (Fluconazole) .... Take One Tablet For Symptoms of Candidal Infection.  Current Medications (verified): 1)  Advil 200 Mg Tabs (Ibuprofen) .... Take 1 Tablet By Mouth Every 6 Hours As Needed For Sinus Pain and Headache. 2)  Amoxicillin 500 Mg Caps (Amoxicillin) .... Take One Tablet Two Times A Day For 10 Days.  3)  Guaifenesin 200 Mg Tabs (Guaifenesin) .... Take One Tablet Three Times A Day To Thin Secretions. 4)  Fluconazole 150 Mg Tabs (Fluconazole) .... Take One Tablet For Symptoms of Candidal Infection. 5)  Allergy 25 Mg Tabs (Diphenhydramine Hcl) .... Take 1 Tablet By Mouth Three Times A Day  Allergies: 1)  ! Codeine 2)  ! Xanax  Past History:  Past Medical History: Last updated: 10/14/2007 Palpitations Multiple neurological symptoms with neg w/u MRI/MRA in march 2007 wnl except decr caliber MCA prox, distal cavernous portion of left LCA which may represent true stenosis or technique on exam. Been evaluated by Dr. Luberta Robertson  ? partial  seizures H/o cervical anaplasia, CIN3 2002,S/P LEEP Rx  repeat paps neg Cervical diskectomy, Dr Venetia Maxon, Anterior discectomy c5-c7 Right fallopian tube removed Multiple ovarian cysts removed, for polycystic ovary Appendectomy Tonsillectomy Bunion removal Hydrosalphinx, following up with [facility]  Family History: Last updated: 10/14/2007 Lives with adopted family. Doesn't know a lot about biological parents. ?deg joint disorders in uncles  Social History: Last updated: 10/15/2008 current smoker Drinks occasionally No history of drug abuse.  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   Risk Factors: Alcohol Use: 0 (10/15/2008) Exercise: yes (10/19/2008)  Risk Factors: Smoking Status: current (10/15/2008) Packs/Day: 1/4 ppd (10/15/2008) Passive Smoke Exposure: no (10/15/2008)  Family History: Reviewed history from 10/14/2007 and no changes required. Lives with adopted family. Doesn't know a lot about biological parents. ?deg joint disorders in uncles  Social History: Reviewed history from 10/15/2008 and no changes required. current smoker Drinks occasionally No history of drug abuse.  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   Review of Systems  The patient denies fever, chest pain, dyspnea on exertion, peripheral edema, prolonged cough, headaches, hemoptysis, abdominal pain, melena, hematochezia, hematuria, muscle weakness, difficulty walking, and unusual weight change.    Physical Exam  General:  NAD Lungs:  Normal respiratory effort, chest expands symmetrically. Lungs are clear to auscultation, no crackles or wheezes. Heart:  Normal rate and regular rhythm. S1 and S2 normal without gallop, murmur, click, rub or other extra sounds. Abdomen:  soft, non-tender, and normal bowel sounds.   Genitalia:  normal introitus, no external lesions, no vaginal discharge, mucosa pink and moist, no vaginal  or cervical lesions, and no vaginal atrophy.   Extremities:  no edema Neurologic:  alert & oriented X3, strength normal in all extremities, and gait normal.     Impression & Recommendations:  Problem # 1:  SINUSITIS (ICD-473.9) Continue current treatment. Responding well to medical regimen.   The following medications were removed from the medication list:    Afrin Nasal Spray 0.05 % Soln (Oxymetazoline hcl) .Marland Kitchen... 1 spray in each nostril twice daily Her updated medication list for this problem includes:    Amoxicillin 500 Mg Caps (Amoxicillin) .Marland Kitchen... Take one tablet two times a day for 10 days.    Guaifenesin 200 Mg Tabs (Guaifenesin) .Marland Kitchen... Take one tablet three times a day to thin secretions.    Saline Nasal Spray 0.65 % Soln (Saline) .Marland Kitchen... Three times a day  Problem # 2:  SCREENING FOR LIPOID DISORDERS (ICD-V77.91) Will review labs and further assess.   Orders: T-Lipid Profile ([id number])  Problem # 3:  Preventive Health Care (ICD-V70.0) Pap done today. Will follow up on results.   Complete Medication List: 1)  Advil 200 Mg Tabs (Ibuprofen) .... Take 1 tablet by mouth every 6 hours as needed for  sinus pain and headache. 2)  Amoxicillin 500 Mg Caps (Amoxicillin) .... Take one tablet two times a day for 10 days. 3)  Guaifenesin 200 Mg Tabs (Guaifenesin) .... Take one tablet three times a day to thin secretions. 4)  Fluconazole 150 Mg Tabs (Fluconazole) .... Take one tablet for symptoms of candidal infection. 5)  Allergy 25 Mg Tabs (Diphenhydramine hcl) .... Take 1 tablet by mouth three times a day 6)  Saline Nasal Spray 0.65 % Soln (Saline) .... Three times a day  Other Orders: T-Pap Smear, Thin Prep (16109)  Patient Instructions: 1)  Please schedule a follow-up appointment in 6 months. 2)  You will be called with any abnormalities in the tests scheduled or performed today.  If you don't hear from [place] within a week from when the test was performed, you can assume that your test  was normal.  3)  Continue taking all medication as indicated. 4)  Return to clinic for Fasting Lipid Panel.  Prevention & Chronic Care Immunizations   Influenza vaccine: Not documented    Tetanus booster: 04/03/2001: approx. date   Tetanus booster due: 04/04/2011    Pneumococcal vaccine: Not documented   Pneumococcal vaccine due: 11/17/2034  Other Screening   Pap smear: Not documented   Pap smear action/deferral: Ordered  (10/19/2008)   Smoking status: current  (10/15/2008)   Smoking cessation counseling: yes  (10/15/2008)  Lipids   Total Cholesterol: 155  (11/09/2006)   LDL: 96  (11/09/2006)   LDL Direct: Not documented   HDL: 45  (11/09/2006)   Triglycerides: 70  (11/09/2006)   Lipid panel due: 11/09/2011   Nursing Instructions: Pap smear today

## 2010-05-05 NOTE — Progress Notes (Signed)
Summary: sick visit 04/21/06/ hla  Phone Note Call from Patient   Caller: Patient Reason for Call: Acute Illness Summary of Call: pt calls stating she has been dizzy, had a h/a, joint pain and diarrhea x 24hrs, denies fever. appt given 1/19 at 2pm dr walsh/pcp- dr Lorel Monaco Initial call taken by: Marin Roberts RN,  April 22, 2007 9:42 AM

## 2010-05-05 NOTE — Progress Notes (Signed)
Summary: phone call/gg  Phone Note Call from Patient   Caller: Patient Summary of Call: Pt called in  c/o dizziness and weakness, vaginal d/c - pick in color x 3 days, change in shape of BM and pain to touch to area of breast.    Will see pt today Initial call taken by: Merrie Roof RN,  May 13, 2007 8:55 AM

## 2010-06-20 LAB — COMPREHENSIVE METABOLIC PANEL
ALT: 17 U/L (ref 0–35)
AST: 18 U/L (ref 0–37)
Albumin: 3.8 g/dL (ref 3.5–5.2)
Alkaline Phosphatase: 52 U/L (ref 39–117)
BUN: 8 mg/dL (ref 6–23)
CO2: 25 mEq/L (ref 19–32)
Calcium: 8.6 mg/dL (ref 8.4–10.5)
Chloride: 106 mEq/L (ref 96–112)
Creatinine, Ser: 0.78 mg/dL (ref 0.4–1.2)
GFR calc Af Amer: >60 mL/min (ref >60)
GFR calc non Af Amer: >60 mL/min (ref >60)
Glucose, Bld: 106 mg/dL — ABNORMAL HIGH (ref 70–99)
Potassium: 3.7 mEq/L (ref 3.5–5.1)
Sodium: 134 mEq/L — ABNORMAL LOW (ref 135–145)
Total Bilirubin: 0.8 mg/dL (ref 0.3–1.2)
Total Protein: 6.4 g/dL (ref 6.0–8.3)

## 2010-06-20 LAB — DIFFERENTIAL
Basophils Absolute: 0.1 10*3/uL (ref 0.0–0.1)
Basophils Relative: 1 % (ref 0–1)
Eosinophils Absolute: 0.2 10*3/uL (ref 0.0–0.7)
Eosinophils Relative: 2 % (ref 0–5)
Lymphocytes Relative: 19 % (ref 12–46)
Lymphs Abs: 2 10*3/uL (ref 0.7–4.0)
Monocytes Absolute: 0.6 10*3/uL (ref 0.1–1.0)
Monocytes Relative: 6 % (ref 3–12)
Neutro Abs: 7.6 10*3/uL (ref 1.7–7.7)
Neutrophils Relative %: 72 % (ref 43–77)

## 2010-06-20 LAB — CBC
HCT: 40.8 % (ref 36.0–46.0)
Hemoglobin: 14.2 g/dL (ref 12.0–15.0)
MCHC: 34.9 g/dL (ref 30.0–36.0)
MCV: 88.9 fL (ref 78.0–100.0)
Platelets: 224 10*3/uL (ref 150–400)
RBC: 4.59 MIL/uL (ref 3.87–5.11)
RDW: 12.9 % (ref 11.5–15.5)
WBC: 10.5 10*3/uL (ref 4.0–10.5)

## 2010-06-20 LAB — URINALYSIS, ROUTINE W REFLEX MICROSCOPIC
Bilirubin Urine: NEGATIVE
Glucose, UA: NEGATIVE mg/dL
Hgb urine dipstick: NEGATIVE
Ketones, ur: NEGATIVE mg/dL
Nitrite: NEGATIVE
Protein, ur: NEGATIVE mg/dL
Specific Gravity, Urine: 1.013 (ref 1.005–1.030)
Urobilinogen, UA: 1 mg/dL (ref 0.0–1.0)
pH: 6.5 (ref 5.0–8.0)

## 2010-06-20 LAB — D-DIMER, QUANTITATIVE: D-Dimer, Quant: <0.22 ug/mL-FEU (ref 0.00–0.48)

## 2010-06-20 LAB — LIPASE, BLOOD: Lipase: 20 U/L (ref 11–59)

## 2010-06-26 LAB — CSF CELL COUNT WITH DIFFERENTIAL
RBC Count, CSF: 0 /mm3
RBC Count, CSF: 0 /mm3
RBC Count, CSF: 0 /mm3
Tube #: 1
Tube #: 2
Tube #: 4
WBC, CSF: 1 /mm3 (ref 0–5)
WBC, CSF: 2 /mm3 (ref 0–5)
WBC, CSF: 2 /mm3 (ref 0–5)

## 2010-06-26 LAB — BASIC METABOLIC PANEL
BUN: 11 mg/dL (ref 6–23)
BUN: 11 mg/dL (ref 6–23)
CO2: 27 mEq/L (ref 19–32)
CO2: 28 mEq/L (ref 19–32)
Calcium: 8.8 mg/dL (ref 8.4–10.5)
Calcium: 9.1 mg/dL (ref 8.4–10.5)
Chloride: 103 mEq/L (ref 96–112)
Chloride: 103 mEq/L (ref 96–112)
Creatinine, Ser: 0.71 mg/dL (ref 0.4–1.2)
Creatinine, Ser: 0.77 mg/dL (ref 0.4–1.2)
GFR calc Af Amer: >60 mL/min (ref >60)
GFR calc Af Amer: >60 mL/min (ref >60)
GFR calc non Af Amer: >60 mL/min (ref >60)
GFR calc non Af Amer: >60 mL/min (ref >60)
Glucose, Bld: 125 mg/dL — ABNORMAL HIGH (ref 70–99)
Glucose, Bld: 159 mg/dL — ABNORMAL HIGH (ref 70–99)
Potassium: 3.2 mEq/L — ABNORMAL LOW (ref 3.5–5.1)
Potassium: 4.2 mEq/L (ref 3.5–5.1)
Sodium: 138 mEq/L (ref 135–145)
Sodium: 139 mEq/L (ref 135–145)

## 2010-06-26 LAB — CBC
HCT: 39.7 % (ref 36.0–46.0)
Hemoglobin: 13.7 g/dL (ref 12.0–15.0)
MCHC: 34.4 g/dL (ref 30.0–36.0)
MCV: 88.6 fL (ref 78.0–100.0)
Platelets: 263 10*3/uL (ref 150–400)
RBC: 4.48 MIL/uL (ref 3.87–5.11)
RDW: 12.5 % (ref 11.5–15.5)
WBC: 10.3 10*3/uL (ref 4.0–10.5)

## 2010-06-26 LAB — DIFFERENTIAL
Basophils Absolute: 0.1 10*3/uL (ref 0.0–0.1)
Basophils Relative: 1 % (ref 0–1)
Eosinophils Absolute: 0.4 10*3/uL (ref 0.0–0.7)
Eosinophils Relative: 4 % (ref 0–5)
Lymphocytes Relative: 29 % (ref 12–46)
Lymphs Abs: 3 10*3/uL (ref 0.7–4.0)
Monocytes Absolute: 0.7 10*3/uL (ref 0.1–1.0)
Monocytes Relative: 6 % (ref 3–12)
Neutro Abs: 6.2 10*3/uL (ref 1.7–7.7)
Neutrophils Relative %: 60 % (ref 43–77)

## 2010-06-26 LAB — PROTEIN AND GLUCOSE, CSF
Glucose, CSF: 61 mg/dL (ref 43–76)
Total  Protein, CSF: 33 mg/dL (ref 15–45)

## 2010-06-26 LAB — PREGNANCY, URINE: Preg Test, Ur: NEGATIVE

## 2010-06-26 LAB — HEMOGLOBIN A1C
Hgb A1c MFr Bld: 5.3 % (ref 4.6–6.1)
Mean Plasma Glucose: 105 mg/dL

## 2010-06-26 LAB — APTT: aPTT: 31 seconds (ref 24–37)

## 2010-06-26 LAB — PROTIME-INR
INR: 1.04 (ref 0.00–1.49)
Prothrombin Time: 13.5 seconds (ref 11.6–15.2)

## 2010-08-09 ENCOUNTER — Encounter: Payer: Self-pay | Admitting: Internal Medicine

## 2010-08-16 NOTE — Group Therapy Note (Signed)
Victoria Hall, Victoria Hall NO.:  [id number]   MEDICAL RECORD NO.:  [id number]          PATIENT TYPE:  WOC   LOCATION:  [facility]                   FACILITY:  [facility]   PHYSICIAN:  Karlton Lemon, MD      DATE OF BIRTH:  January 26, 1971   DATE OF SERVICE:  07/26/2007                                  CLINIC NOTE   CHIEF COMPLAINT:  Left lower quadrant pain.  Referred by [facility] for left hydrosalpinx.   HISTORY OF PRESENT ILLNESS:  This is a 40 year old gravida 0 presenting  with referral from the [facility] for left lower  quadrant abdominal pain and for the past several months.  She has been  evaluated for gastrointestinal causes of her abdominal pain as well as  urinary etiologies.  She has had a CT scan of the pelvis showing the 4.1  x 7.3 x 6.4 cystic structure in the left adnexa suspicious for  hydrosalpinx.  She had a follow-up ultrasound showing a left  hydrosalpinx measuring 5.7 x 3.4 x 5.0 cm.  She states she has had pain  for the past several months in the left lower quadrant.  She has also  had epigastric pain and nausea vomiting after eating.  Her bowel  movements have been very soft at times and have been evaluated with  immunoglobulins for celiac disease which appear to have been negative.  She states that her pain is worse with sitting and worsens as her  bladder fills.  She has no dysuria but her pain is worsened after  urinating and lasts about 10 to 15 minutes before resolving.  She now  notes no pain with sexual intercourse.  She is sexually active with one  partner and is using condoms for the past four years.  She had had a  gonorrhea and chlamydia test done at her primary care physician's which  was negative on February 12.  She notes a small amount of whitish  discharge but no vaginal irritation.  Her periods are every 26 days and  lasts four to seven days.  On her heaviest days she uses 7 to 8 pads and  the pads  do soak through.  A few months ago she did have an episode of  spotting between her periods.  She has noticed that her last period  began with some coffee ground appearing bleeding.  The patient does have  a history of laparoscopy to evaluate abdominal pain and she states that  there was a lot of scarring at that time but there was no other  procedure done.  She has had interventional radiology procedures  performed to drain cysts from her ovaries.   PAST MEDICAL HISTORY:  Question of epilepsy, currently not on  medications.  Continuing gastrointestinal complaints and presumptive  anxiety due to medication of diazepam though this was not discussed with  the patient.   OBSTETRICAL HISTORY:  The patient has never been pregnancy.   GYNECOLOGIC HISTORY:  The patient has had abnormal Pap smears but did  have a Pap smear in 2008 which she believes was normal.  She does have a  history of a LEEP in 2007.  She had a normal mammogram in 2009.   PAST SURGICAL HISTORY:  1. The patient had an appendectomy at age 66.  2. She had an anterior diskectomy at C5 to C7 in 2003.  3. She had laparoscopy in 1998 in [place] as stated in HPI.  4. She had two interventional radiology procedures to drain cysts from      her ovaries.   MEDICATIONS:  1. Advil over-the-counter by mouth as needed for pain.  2. Diazepam 2 mg by mouth as needed.   ALLERGIES:  CODEINE AND XANAX.   FAMILY HISTORY:  The patient reports a grandparent with heart disease.   SOCIAL HISTORY:  The patient lives by herself, is employed, is sexually  active with one partner, does smoke a half pack per day for the past 15  years.  She rarely drinks alcohol and denies drug use.   REVIEW OF SYSTEMS:  The patient does complain of frequent nausea and  vomiting and change in her bowel movements, fatigue, weight loss,  headaches and night sweats.  Review of systems is otherwise normal  unless stated differently in the HPI.   RECENT  LABORATORY:  GC and Chlamydia negative.  Celiac disease and  immunoglobulin panel normal.  Hemoglobin 13.7, white blood cell count  9.1.  Platelet count 281.  Sodium 139, potassium 4.2, AST 15, ALT 14,  glucose 84, creatinine 0.8, total bilirubin 0.7.  Urinalysis is  negative.  These labs were from previous visit with the primary care  physician.   PHYSICAL EXAMINATION:  GENERAL:  The patient is a well-appearing female  in no distress.  VITAL SIGNS:  Temperature 97.5, pulse 80, blood pressure 107/72.  Weight  152.3 pounds.  HEENT:  Head is normocephalic and atraumatic.  CARDIOVASCULAR:  Heart has regular rate and rhythm, no murmurs, rubs or  gallops.  RESPIRATORY:  Lungs are clear to auscultation bilaterally.  ABDOMEN:  Soft, mild tenderness in the left lower quadrant and to the  right of the umbilicus, no rebound tenderness or guarding.  Bowel sounds  are noted though they are hypoactive in all quadrants.  No masses are  palpable on abdominal exam.  GENITOURINARY:  Normal female external genitalia.  Vaginal mucosa is  pink and moist.  There is a whitish, yeasty-appearing discharge.  Cervix  is midline and appeared normal.  There is no discharge from the cervix.  On bimanual examination the uterus is midline and retroverted.  There is  minimal cervical motion tenderness with displacement of the uterus to  the patient's left.  The right adnexa is normal on palpation.  There is  mild tenderness to palpation of the right adnexa.  The left adnexa is  slightly full.  There are no distinct masses felt.  There is mild to  moderate tenderness to palpation of the left adnexa.  EXTREMITIES:  No cyanosis, clubbing or edema.  SKIN:  The patient has multiple tattoos on her body.   ASSESSMENT AND PLAN:  A 40 year old gravida 0 with left hydrosalpinx  diagnosed on ultrasound and CT scan.  The patient likely does have some  pain related to this left hydrosalpinx.  She was given precautions for   development of tubo-ovarian abscess or infection in that tube including  fever, infectious symptoms and increasing pain on the left side.  She  currently practices safe sex with condoms and is currently with only one  partner for the  past four years.  Counseled that there is no indication  for surgery at this time and to treat her pain symptomatically.  The  patient was discussed with Johnella Moloney, MD, who agrees with the plan of  care.           ______________________________  Karlton Lemon, MD     NS/MEDQ  D:  07/26/2007  T:  07/26/2007  Job:  [id number]

## 2010-08-19 NOTE — Op Note (Signed)
Seth Ward. [facility]  Patient:    Victoria Hall, Victoria Hall Visit Number: [id number] MRN: [id number]          Service Type: DSU Location: [facility] Attending Physician:  Susy Frizzle Dictated by:   Jeannett Senior Pollyann Kennedy, M.D. Proc. Date: 04/15/01 Admit Date:  04/15/2001                             Operative Report  PREOPERATIVE DIAGNOSIS:  Chronic cryptic tonsillitis.  POSTOPERATIVE DIAGNOSIS:  Chronic cryptic tonsillitis.  PROCEDURE:  Tonsillectomy.  SURGEON:  Jefry H. Pollyann Kennedy, M.D.  ANESTHESIA:  General endotracheal anesthesia.  COMPLICATIONS:  None.  ESTIMATED BLOOD LOSS:  None.  FINDINGS:  Bilateral mild to moderate enlargement of the tonsils with deep cryptic spaces containing large amounts of debris.  Adenoids were not present.  REFERRING PHYSICIAN:  Jethro Bastos, M.D.  HISTORY:  This is a 40 year old lady with a long history of chronic cryptic tonsillitis, who has failed therapy using topical-type hygiene.  The risks, benefits, alternatives, and complications of the procedure were explained to the patient, who seemed to understand and agreed to surgery.  DESCRIPTION OF PROCEDURE:  The patient was taken to the operating room and placed on the operating table in the supine position.  Following induction of general endotracheal anesthesia, the table was turned 90 degrees.  The patient was draped in the standard fashion.  A Crowe-Davis mouth gag was inserted into the oral cavity, used to retract the tongue and mandible, and attached to the Mayo stand.  A red rubber catheter was inserted through the right side of the nose, withdrawn through the mouth, and used to retract the soft palate and uvula.  Indirect exam of the nasopharynx was performed.  No significant changes of the adenoid tissue that warranted consideration for adenoidectomy. Tonsillectomy was performed using electrocautery dissection, carefully dissecting the avascular plane between the tonsil  capsule and the constrictor muscles.  Multiple bleeders were encountered along the dissection and were coagulated with the electrocautery unit.  The tonsils were sent together for pathologic evaluation.  The pharynx was suctioned of blood and secretions, irrigated with saline solution.  The mouth gag was released, the pharynx was reinspected.  There was no evidence of any bleeding.  The patient was then awakened from anesthesia, transferred to recovery in stable condition. Dictated by:   Jeannett Senior Pollyann Kennedy, M.D. Attending Physician:  Susy Frizzle DD:  04/15/01 TD:  04/15/01 Job: [id number]

## 2010-08-19 NOTE — Op Note (Signed)
NAME:  Victoria Hall                          ACCOUNT NO.:  [id number]   MEDICAL RECORD NO.:  [id number]                   PATIENT TYPE:  INP   LOCATION:  NA                                   FACILITY:  [facility]   PHYSICIAN:  Danae Orleans. Venetia Maxon, M.D.               DATE OF BIRTH:  1970-10-13   DATE OF PROCEDURE:  12/10/2001  DATE OF DISCHARGE:                                 OPERATIVE REPORT   PREOPERATIVE DIAGNOSES:  Herniated cervical disk at C5-6 and C6-7 with  degenerative disk disease, spondylosis and radiculopathy.   POSTOPERATIVE DIAGNOSES:  Herniated cervical disk at C5-6 and C6-7 with  degenerative disk disease, spondylosis and radiculopathy.   OPERATION PERFORMED:  Anterior cervical diskectomy and fusion, C5-6 and C6-7  levels with allograft bone graft and anterior cervical plate.   SURGEON:  Danae Orleans. Venetia Maxon, M.D.   ASSISTANT:  Payton Doughty, M.D.   ANESTHESIA:  General endotracheal.   ESTIMATED BLOOD LOSS:  Minimal.   COMPLICATIONS:  None.   DISPOSITION:  Recovery.   INDICATIONS FOR PROCEDURE:  The patient is a 40 year old woman with a severe  right upper extremity radiculopathy involving C6 and C7 nerves.  She has a  large disk herniation at the C5-6 level on the right and a smaller disk  herniation at C6-7.  It was elected to take her to surgery for anterior  cervical diskectomy and fusion.   DESCRIPTION OF PROCEDURE:  The patient was brought to the operating room.  Following satisfactory and uncomplicated induction of general endotracheal  anesthesia and placement of intravenous lines, the patient was placed in the  supine position on the operating table, the neck was placed in slight  extension and she was placed in 10 pounds of halter traction.  Her anterior  neck was then prepped and draped in the usual sterile fashion.  The area of  planned incision was infiltrated with 0.25% Marcaine and 0.5% lidocaine with  1:200,000 epinephrine and incision was made from  the midline to the anterior  border of the sternocleidomastoid muscle in the lower neck creases and  carried sharply through the platysmal layers.  Subplatysmal dissection was  performed and the anterior cervical spine was identified after blunt  dissection keeping the carotid sheath lateral and the trachea and esophagus  medial.  A spinal needle was placed at what was felt to be the C5-6 level  and this was confirmed under intraoperative x-ray.  The longus colli muscles  were then taken down from the anterior cervical spine from C5 through C7  levels using electrocautery and Key elevator.  Shadow line self-retaining  retractor was placed and up down retractor was also placed.  The disk spaces  at C5-6 and C6-7 were then incised with a 15 blade and disk material was  removed in a piecemeal fashion.  Carlens curets were used to strip the end  plates of  residual disk material.  The microscope was brought into the field  and using a disk space spreader initially at the C5-6 level, the end plates  were decorticated and the posterior longitudinal ligament and the remaining  disk was removed.  There were multiple large fragments of disk material  which were removed from the C5-6 neural foramen on the right with resultant  decompression of the right C6 nerve root.  Subsequently both nerve roots and  spinal cord dura were felt to be well decompressed.  Hemostasis was assured  with Gelfoam soaked in thrombin.  Trinica bone graft sizers were then used  and it was found that an 8 mm bone graft would fit appropriately.  This was  then rehydrated, cut to a depth of 12 mm, inserted in the interspace and  countersunk appropriately.  Attention was then turned to the C6-7 level  where a similar decompression was performed.  At this level, there was  central disk protrusion and a small amount of right-sided herniated disk  material and both the C7 nerve roots and spinal cord dura were decompressed  at this  level as well.  Hemostasis was assured with Gelfoam soaked in  thrombin.  A similarly sized bone graft was inserted and countersunk  appropriately.  The ventral osteophytes were removed in preparation for  plating and a 46 mm Trinica plate was affixed to the anterior cervical spine  using two 14 mm variable angle screws at C5, two similarly sized screws at  C6 and similarly at C7.  All screws had excellent purchase.  Locking  mechanisms were engaged.  Final x-ray confirmed position of bone graft and  the anterior cervical plate.  Hemostasis assured and soft tissues were  inspected and found to be in good repair.  The platysma later was then  closed with 3-0 Vicryl sutures.  The subcuticular layer was reapproximated  with 4-0 Vicryl subcuticular stitch.  The wound was dressed with Dermabond.  The patient was extubated in the operating room and taken to the recovery  room in stable and satisfactory condition having tolerated the operation  well.  Counts were correct at the end of the case.                                                 Danae Orleans. Venetia Maxon, M.D.    JDS/MEDQ  D:  12/10/2001  T:  12/11/2001  Job:  [id number]

## 2010-12-23 LAB — DIFFERENTIAL
Basophils Absolute: 0
Basophils Relative: 1
Eosinophils Absolute: 0.3
Eosinophils Relative: 4
Lymphocytes Relative: 21
Lymphs Abs: 1.5
Monocytes Absolute: 0.4
Monocytes Relative: 6
Neutro Abs: 5
Neutrophils Relative %: 69

## 2010-12-23 LAB — CBC
HCT: 37.3
Hemoglobin: 12.8
MCHC: 34.5
MCV: 86.5
Platelets: 245
RBC: 4.31
RDW: 12.5
WBC: 7.3

## 2011-01-10 LAB — CBC
HCT: 41.3
Hemoglobin: 14.2
MCHC: 34.3
MCV: 87.3
Platelets: 272
RBC: 4.73
RDW: 12.4
WBC: 11 — ABNORMAL HIGH

## 2011-01-10 LAB — DIFFERENTIAL
Basophils Absolute: 0.1
Basophils Relative: 1
Eosinophils Absolute: 0.6
Eosinophils Relative: 6 — ABNORMAL HIGH
Lymphocytes Relative: 33
Lymphs Abs: 3.6 — ABNORMAL HIGH
Monocytes Absolute: 0.7
Monocytes Relative: 6
Neutro Abs: 6
Neutrophils Relative %: 55

## 2011-01-10 LAB — BASIC METABOLIC PANEL
BUN: 9
CO2: 25
Calcium: 8.8
Chloride: 99
Creatinine, Ser: 0.75
GFR calc Af Amer: >60
GFR calc non Af Amer: >60
Glucose, Bld: 97
Potassium: 3.8
Sodium: 131 — ABNORMAL LOW

## 2011-03-18 ENCOUNTER — Emergency Department (HOSPITAL_COMMUNITY)
Admission: EM | Admit: 2011-03-18 | Discharge: 2011-03-18 | Discharge status: Against Medical Advice | Payer: Self-pay | Attending: Emergency Medicine | Admitting: Emergency Medicine

## 2011-03-18 ENCOUNTER — Encounter (HOSPITAL_COMMUNITY): Payer: Self-pay | Admitting: Emergency Medicine

## 2011-03-18 DIAGNOSIS — R109 Unspecified abdominal pain: Secondary | ICD-10-CM | POA: Insufficient documentation

## 2011-03-18 NOTE — ED Notes (Signed)
Pt is c/o pain in her lower abdomen just above her pubic bone  Pt states she had just started having intercoarse and developed a sharp pain  Pt states pain is continuous

## 2014-09-03 ENCOUNTER — Emergency Department (HOSPITAL_COMMUNITY)
Admission: EM | Admit: 2014-09-03 | Discharge: 2014-09-03 | Disposition: A | Discharge status: Home / Self Care | Payer: No Typology Code available for payment source | Attending: Emergency Medicine | Admitting: Emergency Medicine

## 2014-09-03 ENCOUNTER — Emergency Department (HOSPITAL_COMMUNITY): Payer: No Typology Code available for payment source

## 2014-09-03 ENCOUNTER — Encounter (HOSPITAL_COMMUNITY): Payer: Self-pay | Admitting: Emergency Medicine

## 2014-09-03 DIAGNOSIS — Z8742 Personal history of other diseases of the female genital tract: Secondary | ICD-10-CM | POA: Diagnosis not present

## 2014-09-03 DIAGNOSIS — Y92410 Unspecified street and highway as the place of occurrence of the external cause: Secondary | ICD-10-CM | POA: Insufficient documentation

## 2014-09-03 DIAGNOSIS — Y998 Other external cause status: Secondary | ICD-10-CM | POA: Diagnosis not present

## 2014-09-03 DIAGNOSIS — R51 Headache: Secondary | ICD-10-CM | POA: Diagnosis not present

## 2014-09-03 DIAGNOSIS — Z8639 Personal history of other endocrine, nutritional and metabolic disease: Secondary | ICD-10-CM | POA: Diagnosis not present

## 2014-09-03 DIAGNOSIS — S0993XA Unspecified injury of face, initial encounter: Secondary | ICD-10-CM | POA: Diagnosis not present

## 2014-09-03 DIAGNOSIS — Y9389 Activity, other specified: Secondary | ICD-10-CM | POA: Diagnosis not present

## 2014-09-03 DIAGNOSIS — Z8541 Personal history of malignant neoplasm of cervix uteri: Secondary | ICD-10-CM | POA: Insufficient documentation

## 2014-09-03 DIAGNOSIS — R110 Nausea: Secondary | ICD-10-CM | POA: Diagnosis not present

## 2014-09-03 DIAGNOSIS — S199XXA Unspecified injury of neck, initial encounter: Secondary | ICD-10-CM | POA: Diagnosis present

## 2014-09-03 DIAGNOSIS — Z720 Tobacco use: Secondary | ICD-10-CM | POA: Insufficient documentation

## 2014-09-03 MED ORDER — MORPHINE SULFATE 4 MG/ML IJ SOLN
4.0000 mg | Freq: Once | INTRAMUSCULAR | Status: DC
  Filled 2014-09-03: qty 1

## 2014-09-03 MED ORDER — OXYCODONE-ACETAMINOPHEN 5-325 MG PO TABS
1.0000 | ORAL_TABLET | Freq: Once | ORAL | Status: DC
Start: 2014-09-03 — End: 2014-09-03

## 2014-09-03 MED ORDER — OXYCODONE-ACETAMINOPHEN 5-325 MG PO TABS
1.0000 | ORAL_TABLET | Freq: Once | ORAL | Status: AC
  Administered 2014-09-03: 1 via ORAL
  Filled 2014-09-03: qty 1

## 2014-09-03 MED ORDER — CYCLOBENZAPRINE HCL 10 MG PO TABS
5.0000 mg | ORAL_TABLET | Freq: Once | ORAL | Status: AC
  Administered 2014-09-03: 5 mg via ORAL
  Filled 2014-09-03: qty 1

## 2014-09-03 MED ORDER — CYCLOBENZAPRINE HCL 5 MG PO TABS: 5.0000 mg | ORAL_TABLET | Freq: Two times a day (BID) | ORAL | Status: DC | PRN

## 2014-09-03 MED ORDER — OXYCODONE-ACETAMINOPHEN 5-325 MG PO TABS: 1.0000 | ORAL_TABLET | Freq: Four times a day (QID) | ORAL | Status: DC | PRN

## 2014-09-03 MED ORDER — ONDANSETRON 4 MG PO TBDP
4.0000 mg | ORAL_TABLET | Freq: Once | ORAL | Status: AC
  Administered 2014-09-03: 4 mg via ORAL
  Filled 2014-09-03: qty 1

## 2014-09-03 MED ORDER — MECLIZINE HCL 32 MG PO TABS: 32.0000 mg | ORAL_TABLET | Freq: Three times a day (TID) | ORAL | Status: DC | PRN

## 2014-09-03 MED ORDER — CYCLOBENZAPRINE HCL 5 MG PO TABS: 5.0000 mg | ORAL_TABLET | Freq: Once | ORAL | Status: DC
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
## 2014-11-18 ENCOUNTER — Ambulatory Visit (INDEPENDENT_AMBULATORY_CARE_PROVIDER_SITE_OTHER): Payer: 59 | Admitting: Physician Assistant

## 2014-11-18 VITALS — BP 100/62 | HR 75 | Temp 98.2°F | Resp 18 | Ht 66.5 in | Wt 170.0 lb

## 2014-11-18 DIAGNOSIS — T3695XA Adverse effect of unspecified systemic antibiotic, initial encounter: Secondary | ICD-10-CM

## 2014-11-18 DIAGNOSIS — T63301A Toxic effect of unspecified spider venom, accidental (unintentional), initial encounter: Secondary | ICD-10-CM

## 2014-11-18 DIAGNOSIS — B379 Candidiasis, unspecified: Secondary | ICD-10-CM | POA: Diagnosis not present

## 2014-11-18 MED ORDER — DOXYCYCLINE HYCLATE 100 MG PO CAPS: 100.0000 mg | ORAL_CAPSULE | Freq: Two times a day (BID) | ORAL | Status: AC

## 2014-11-18 MED ORDER — FLUCONAZOLE 150 MG PO TABS: 150.0000 mg | ORAL_TABLET | Freq: Once | ORAL | Status: DC

## 2014-11-18 NOTE — Progress Notes (Signed)
[facility] [address] [place] [address] [phone]  Date:  11/18/2014   Name:  Victoria Hall   DOB:  [birth year]   MRN:  [id number]  PCP:  No primary care provider on file.    History of Present Illness:  Victoria Hall is a 44 y.o. female patient who presents to [facility] for chief complaint of insect bite on left leg 2 days ago. Patient states she was sitting on her couch when she wrapped her leg and felt some tenderness over the left lateral side of her thigh. She notes that it felt warm. Today she thought she had some fever and chills though she has no temperature to date. She also has body aches, fatigue nausea, and loss of appetite. There is no instability of leg. No pain with walking. Patient noted a red blemish on leg though there has not been drainage or bull's-eye lesion. Patient not has not taken anything for this. She has not taken any new meds. Patient owns 2 dogs, for which she says have had their tick and flea treatments.     Patient Active Problem List   Diagnosis Date Noted  . MUSCLE SPASM, BACK 06/08/2009  . SINUSITIS 10/15/2008  . SKIN RASH 10/02/2008  . OTITIS EXTERNA 10/01/2008  . ACUTE PHARYNGITIS 10/01/2008  . URI 10/01/2008  . HYDROSALPINX 02/07/2008  . HEADACHE 10/28/2007  . TACHYCARDIA 10/14/2007  . OVARIAN CYST 06/06/2007  . ANXIETY DISORDER 05/28/2007  . VAGINAL DISCHARGE 05/13/2007  . POLYARTHRITIS 05/09/2007  . UNSPECIFIED DISORDERS OF NERVOUS SYSTEM 04/22/2007  . ABDOMINAL PAIN RIGHT LOWER QUADRANT 04/08/2007  . PALPITATIONS, RECURRENT 11/07/2006  . PROBLEMS W/SMELL/TASTE 06/29/2006  . TOBACCO ABUSE 01/08/2006  . VISUAL IMPAIRMENT 01/08/2006  . DISTURBANCE, VISUAL NOS 01/08/2006  . FIBROCYSTIC BREAST DISEASE 01/08/2006  . [place] DISEASE, CERVICAL 01/08/2006  . VERTIGO 01/08/2006    Past Medical History  Diagnosis Date  . Palpitations   . Neurological abnormality     Neg workup with MRI/MRA in 2007 WNL except  decreased caliber MCA proximally, distal cavernous portion of left LCA which may represent true stenosis or technique on exam. Evaluated by Dr Linus Salmons.  . Partial seizures     questionable diag  . Cervical intraepithelial neoplasia grade 3 2002    s/p LEEP Rx repeat pap negative  . S/P cervical discectomy     Dr Vertell Limber, anterior discectomy C5-C7  . Polycystic ovary     multiple ovarian cysts removed  . Fallopian tube disorder     right fallopian tube removed  . Hydrosalpinx     [facility]  . Seizures     Past Surgical History  Procedure Laterality Date  . Appendectomy    . Tonsillectomy    . Bunionectomy      bunion removal  . Spine surgery      Social History  Substance Use Topics  . Smoking status: Current Every Day Smoker -- 0.50 packs/day for 10 years    Types: Cigarettes  . Smokeless tobacco: None  . Alcohol Use: 0.6 - 1.2 oz/week    1-2 Standard drinks or equivalent per week     Comment: occasionally    Family History  Problem Relation Age of Onset  . Adopted: Yes    Allergies  Allergen Reactions  . Alprazolam   . Codeine Nausea And Vomiting    Medication list has been reviewed and updated.  Current Outpatient Prescriptions on File Prior to Visit  Medication Sig Dispense Refill  . cyclobenzaprine (FLEXERIL) 5 MG tablet Take 1 tablet (5 mg total) by mouth 2 (two) times daily as needed for muscle spasms. (Patient not taking: Reported on 11/18/2014) 30 tablet 0  . ibuprofen (ADVIL,MOTRIN) 200 MG tablet Take 200 mg by mouth every 6 (six) hours as needed for cramping (cramping). For pain and headache    . meclizine (ANTIVERT) 32 MG tablet Take 1 tablet (32 mg total) by mouth 3 (three) times daily as needed. (Patient not taking: Reported on 11/18/2014) 30 tablet 0  . oxyCODONE-acetaminophen (PERCOCET/ROXICET) 5-325 MG per tablet Take 1-2 tablets by mouth every 6 (six) hours as needed for severe pain. (Patient not taking: Reported on 11/18/2014) 20  tablet 0   No current facility-administered medications on file prior to visit.    ROS ROS otherwise unremarkable unless listed above.  Physical Examination: BP 100/62 mmHg  Pulse 75  Temp(Src) 98.2 F (36.8 C) (Oral)  Resp 18  Ht 5' 6.5" (1.689 m)  Wt 170 lb (77.111 kg)  BMI 27.03 kg/m2  SpO2 98%  LMP 11/10/2014 Ideal Body Weight: Weight in (lb) to have BMI = 25: 156.9  Physical Exam Alert, cooperative, oriented 4 in no acute distress. PERRLA with normal conjunctiva. Normal breath sounds without wheezing or rhonchi. Regular rate and rhythm. Lateral left thigh with small erythematous nonraised lesion. There is mild tenderness at this location. There is tenderness along the lateral thigh.  Normal distal pulses. Normal extremity movement throughout.  Assessment and Plan: 44 year old female with past medical history listed above is here today for chief complaint of insect bite. Vital signs are within normal limits. Musculature appears normal without report of cramping. I have issued her doxycycline. Advised of alarming symptoms warrant immediate return.  1. Spider bite, accidental or unintentional, initial encounter - doxycycline (VIBRAMYCIN) 100 MG capsule; Take 1 capsule (100 mg total) by mouth 2 (two) times daily.  Dispense: 20 capsule; Refill: 0  2. Antibiotic-induced yeast infection - fluconazole (DIFLUCAN) 150 MG tablet; Take 1 tablet (150 mg total) by mouth once. Repeat if needed  Dispense: 2 tablet; Refill: 0   Ivar Drape, PA-C [facility] 11/18/2014 9:09 PM

## 2014-11-18 NOTE — Patient Instructions (Signed)

## 2014-11-19 NOTE — Progress Notes (Signed)
  Medical screening examination/treatment/procedure(s) were performed by non-physician practitioner and as supervising physician I was immediately available for consultation/collaboration.     

## 2014-12-16 ENCOUNTER — Ambulatory Visit (INDEPENDENT_AMBULATORY_CARE_PROVIDER_SITE_OTHER): Payer: 59 | Admitting: Family Medicine

## 2014-12-16 ENCOUNTER — Encounter: Payer: Self-pay | Admitting: Family Medicine

## 2014-12-16 VITALS — BP 110/76 | HR 93 | Temp 99.0°F | Resp 16 | Ht 67.0 in | Wt 171.8 lb

## 2014-12-16 DIAGNOSIS — Z131 Encounter for screening for diabetes mellitus: Secondary | ICD-10-CM | POA: Diagnosis not present

## 2014-12-16 DIAGNOSIS — D72829 Elevated white blood cell count, unspecified: Secondary | ICD-10-CM | POA: Diagnosis not present

## 2014-12-16 DIAGNOSIS — Z130 Encounter for screening for diseases of the blood and blood-forming organs and certain disorders involving the immune mechanism: Secondary | ICD-10-CM

## 2014-12-16 DIAGNOSIS — R6889 Other general symptoms and signs: Secondary | ICD-10-CM | POA: Diagnosis not present

## 2014-12-16 DIAGNOSIS — Z01419 Encounter for gynecological examination (general) (routine) without abnormal findings: Secondary | ICD-10-CM | POA: Diagnosis not present

## 2014-12-16 DIAGNOSIS — Z13220 Encounter for screening for lipoid disorders: Secondary | ICD-10-CM

## 2014-12-16 DIAGNOSIS — Z124 Encounter for screening for malignant neoplasm of cervix: Secondary | ICD-10-CM | POA: Diagnosis not present

## 2014-12-16 LAB — COMPREHENSIVE METABOLIC PANEL
ALT: 24 U/L (ref 6–29)
AST: 22 U/L (ref 10–35)
Albumin: 4.4 g/dL (ref 3.6–5.1)
Alkaline Phosphatase: 57 U/L (ref 33–115)
BUN: 12 mg/dL (ref 7–25)
CO2: 26 mmol/L (ref 20–31)
Calcium: 9.9 mg/dL (ref 8.6–10.2)
Chloride: 100 mmol/L (ref 98–110)
Creat: 0.82 mg/dL (ref 0.50–1.10)
Glucose, Bld: 81 mg/dL (ref 65–99)
Potassium: 4.3 mmol/L (ref 3.5–5.3)
Sodium: 139 mmol/L (ref 135–146)
Total Bilirubin: 0.4 mg/dL (ref 0.2–1.2)
Total Protein: 7 g/dL (ref 6.1–8.1)

## 2014-12-16 LAB — CBC
HCT: 42.9 % (ref 36.0–46.0)
Hemoglobin: 14.3 g/dL (ref 12.0–15.0)
MCH: 28.9 pg (ref 26.0–34.0)
MCHC: 33.3 g/dL (ref 30.0–36.0)
MCV: 86.8 fL (ref 78.0–100.0)
MPV: 10.2 fL (ref 8.6–12.4)
Platelets: 363 10*3/uL (ref 150–400)
RBC: 4.94 MIL/uL (ref 3.87–5.11)
RDW: 13.4 % (ref 11.5–15.5)
WBC: 11.3 10*3/uL — ABNORMAL HIGH (ref 4.0–10.5)

## 2014-12-16 LAB — LDL CHOLESTEROL, DIRECT: Direct LDL: 120 mg/dL (ref <130)

## 2014-12-16 LAB — TSH: TSH: 1.377 u[IU]/mL (ref 0.350–4.500)

## 2014-12-16 NOTE — Patient Instructions (Addendum)
Great to see you today- I will be in touch with your labs asap. Your blood pressure looks fine.   Try to stop smoking or at least cut down!   It might be a good idea to have a mammogram  Please call and schedule a mammogram.  There are several options in town, one good choice is:  [facility] ?  Address: [place], [place], [place] [address]  [phone]

## 2014-12-16 NOTE — Progress Notes (Signed)
[facility] [address] [place] [address] [phone]  Date:  12/16/2014   Name:  Victoria Hall   DOB:  1971-02-06   MRN:  [id number]  PCP:  No primary care provider on file.    Chief Complaint: Gynecologic Exam   History of Present Illness:  Victoria Hall is a 44 y.o. very pleasant female patient who presents with the following:  Here today seeking a pap and labs- she just recently got health insurance and would like to catch up on some of these things.   Declines flu shot, Tdap is UTD  No paps in several years.  She did have an abnormal pap and a loop procedure at age 49.  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 MUSCLE SPASM, BACK 06/08/2009  . SINUSITIS 10/15/2008  . SKIN RASH 10/02/2008  . OTITIS EXTERNA 10/01/2008  . ACUTE PHARYNGITIS 10/01/2008  . URI 10/01/2008  . HYDROSALPINX 02/07/2008  . HEADACHE 10/28/2007  . TACHYCARDIA 10/14/2007  . OVARIAN CYST 06/06/2007  . ANXIETY DISORDER 05/28/2007  . VAGINAL DISCHARGE 05/13/2007  . POLYARTHRITIS 05/09/2007  . UNSPECIFIED DISORDERS OF NERVOUS SYSTEM 04/22/2007  . ABDOMINAL PAIN RIGHT LOWER QUADRANT 04/08/2007  . PALPITATIONS, RECURRENT 11/07/2006  . PROBLEMS W/SMELL/TASTE 06/29/2006  . TOBACCO ABUSE 01/08/2006  . VISUAL IMPAIRMENT 01/08/2006  . DISTURBANCE, VISUAL NOS 01/08/2006  . FIBROCYSTIC BREAST DISEASE 01/08/2006  . [place] DISEASE, CERVICAL 01/08/2006  . VERTIGO 01/08/2006    Past Medical History  Diagnosis Date  . Palpitations   . Neurological abnormality     Neg workup with MRI/MRA in 2007 WNL except decreased caliber  MCA proximally, distal cavernous portion of left LCA which may represent true stenosis or technique on exam. Evaluated by Dr Linus Salmons.  . Partial seizures     questionable diag  . Cervical intraepithelial neoplasia grade 3 2002    s/p LEEP Rx repeat pap negative  . S/P cervical discectomy     Dr Vertell Limber, anterior discectomy C5-C7  . Polycystic ovary     multiple ovarian cysts removed  . Fallopian tube disorder     right fallopian tube removed  . Hydrosalpinx     [facility]  . Seizures     Past Surgical History  Procedure Laterality Date  . Appendectomy    . Tonsillectomy    . Bunionectomy      bunion removal  . Spine surgery      Social History  Substance Use Topics  . Smoking status: Current Every Day Smoker -- 0.50 packs/day for 10 years    Types: Cigarettes  . Smokeless tobacco: None  . Alcohol Use: 0.6 - 1.2 oz/week    1-2 Standard drinks or equivalent per week     Comment: occasionally    Family History  Problem Relation Age of Onset  . Adopted: Yes    Allergies  Allergen Reactions  . Alprazolam   . Codeine Nausea And Vomiting    Medication list has been reviewed and updated.  No current outpatient prescriptions on file prior to visit.   No current facility-administered medications on file prior to visit.    Review of Systems:  As per HPI- otherwise negative.   Physical Examination: Filed Vitals:   12/16/14 1559  BP: 110/76  Pulse: 93  Temp: 99 F (37.2 C)  Resp: 16   Filed Vitals:   12/16/14 1559  Height: 5\' 7"  (1.702 m)  Weight: 171 lb 12.8 oz (77.928 kg)   Body mass index is 26.9 kg/(m^2). Ideal Body Weight: Weight in (lb) to have BMI = 25: 159.3  GEN: WDWN, NAD, Non-toxic, A & O x 3, looks well, mild overweight HEENT: Atraumatic, Normocephalic. Neck supple. No masses, No LAD. Ears and Nose: No external deformity. CV: RRR, No M/G/R. No JVD. No thrill. No extra heart sounds. PULM: CTA B, no wheezes, crackles,  rhonchi. No retractions. No resp. distress. No accessory muscle use. ABD: S, NT, ND. No rebound. No HSM. EXTR: No c/c/e NEURO Normal gait.  PSYCH: Normally interactive. Conversant. Not depressed or anxious appearing.  Calm demeanor.  Pelvic: normal, no vaginal lesions or discharge. Uterus normal, no CMT, no adnexal tendereness or masses   Assessment and Plan: Screening for cervical cancer - Plan: Pap IG and HPV (high risk) DNA detection  Cold intolerance - Plan: TSH  Screening for hyperlipidemia - Plan: LDL cholesterol, direct  Screening for deficiency anemia - Plan: CBC  Screening for diabetes mellitus - Plan: Comprehensive metabolic panel  Pap and other labs pending as above Encouraged her to DC smoking See patient instructions for more details.     Signed Lamar Blinks, MD

## 2014-12-17 ENCOUNTER — Encounter: Payer: Self-pay | Admitting: Family Medicine

## 2014-12-17 LAB — PAP IG AND HPV HIGH-RISK: HPV DNA High Risk: NOT DETECTED

## 2014-12-17 NOTE — Addendum Note (Signed)
Addended by: Lamar Blinks C on: 12/17/2014 06:04 PM   Modules accepted: Orders

## 2015-01-01 ENCOUNTER — Telehealth: Payer: Self-pay

## 2015-01-01 DIAGNOSIS — N644 Mastodynia: Secondary | ICD-10-CM

## 2015-01-01 NOTE — Telephone Encounter (Signed)
I can place order, Dr. Lorelei Pont did she discuss pain with you at last OV? It was not mentioned in the notes.

## 2015-01-01 NOTE — Telephone Encounter (Signed)
Patient called about an order for a mammogram that was sent to [facility].  She said they told her it would need to be changed to a diagnostic mammogram because of reports of breast tenderness.  Please advise, thank you.  CB#: [phone]

## 2015-01-02 NOTE — Telephone Encounter (Signed)
Called pt- she has noted right breast pain-  Will change to diagnostic exam

## 2015-01-05 ENCOUNTER — Other Ambulatory Visit: Payer: Self-pay | Admitting: Family Medicine

## 2015-01-05 DIAGNOSIS — N644 Mastodynia: Secondary | ICD-10-CM

## 2015-01-07 ENCOUNTER — Ambulatory Visit (INDEPENDENT_AMBULATORY_CARE_PROVIDER_SITE_OTHER): Payer: 59 | Admitting: Family Medicine

## 2015-01-07 VITALS — BP 106/66 | HR 87 | Temp 98.8°F | Resp 16 | Ht 67.0 in | Wt 171.0 lb

## 2015-01-07 DIAGNOSIS — J0110 Acute frontal sinusitis, unspecified: Secondary | ICD-10-CM | POA: Diagnosis not present

## 2015-01-07 DIAGNOSIS — R51 Headache: Secondary | ICD-10-CM

## 2015-01-07 DIAGNOSIS — R519 Headache, unspecified: Secondary | ICD-10-CM

## 2015-01-07 MED ORDER — AMOXICILLIN 500 MG PO CAPS
1000.0000 mg | ORAL_CAPSULE | Freq: Two times a day (BID) | ORAL | Status: DC
Start: 2015-01-07 — End: 2015-05-24

## 2015-01-07 NOTE — Progress Notes (Signed)
[facility] [address], [place] [address] [phone]  Date:  01/07/2015   Name:  Victoria Hall   DOB:  29-Apr-1970   MRN:  [id number]  PCP:  No primary care provider on file.    Chief Complaint: Neck Pain; Generalized Body Aches; Ear Pain; Facial Pain; and Dizziness   History of Present Illness:  Victoria Hall is a 44 y.o. very pleasant female patient who presents with the following:  Here today with illness. She first noted pain in her right neck, and felt "just kind of crappy and run down."  She then felt like she was perhaps getting the flu; felt achy all over Today she noted more pressure in her right frontal sinuses, in her right armpit, and in her right neck She has had thoracic outlet issues in the past due to collapse of her cervical vertebrae- she had surgery to repair this over 10 years ago.   This does not really seem like the same thing to her    Sx started about 3 days ago She has not noted a fever No cough  She tried rinsing her sinuses today and it really hurt and make her feel dizzy.  She now notes a lot of sinus pressure She tried one advil so far today- this has helped her a little bit   Patient Active Problem List   Diagnosis Date Noted  . MUSCLE SPASM, BACK 06/08/2009  . HYDROSALPINX 02/07/2008  . HEADACHE 10/28/2007  . TACHYCARDIA 10/14/2007  . ANXIETY DISORDER 05/28/2007  . POLYARTHRITIS 05/09/2007  . UNSPECIFIED DISORDERS OF NERVOUS SYSTEM 04/22/2007  . ABDOMINAL PAIN RIGHT LOWER QUADRANT 04/08/2007  . PALPITATIONS, RECURRENT 11/07/2006  . PROBLEMS W/SMELL/TASTE 06/29/2006  . TOBACCO ABUSE 01/08/2006  . VISUAL IMPAIRMENT 01/08/2006  . DISTURBANCE, VISUAL NOS 01/08/2006  . FIBROCYSTIC BREAST DISEASE 01/08/2006  . [place] DISEASE, CERVICAL 01/08/2006  . VERTIGO 01/08/2006    Past Medical History  Diagnosis Date  . Palpitations   . Neurological abnormality     Neg workup with MRI/MRA in 2007 WNL except decreased  caliber MCA proximally, distal cavernous portion of left LCA which may represent true stenosis or technique on exam. Evaluated by Dr Linus Salmons.  . Partial seizures ([place])     questionable diag  . Cervical intraepithelial neoplasia grade 3 2002    s/p LEEP Rx repeat pap negative  . S/P cervical discectomy     Dr Vertell Limber, anterior discectomy C5-C7  . Polycystic ovary     multiple ovarian cysts removed  . Fallopian tube disorder     right fallopian tube removed  . Hydrosalpinx     [facility]  . Seizures [facility])     Past Surgical History  Procedure Laterality Date  . Appendectomy    . Tonsillectomy    . Bunionectomy      bunion removal  . Spine surgery      Social History  Substance Use Topics  . Smoking status: Current Every Day Smoker -- 0.50 packs/day for 10 years    Types: Cigarettes  . Smokeless tobacco: None  . Alcohol Use: 0.6 - 1.2 oz/week    1-2 Standard drinks or equivalent per week     Comment: occasionally    Family History  Problem Relation Age of Onset  . Adopted: Yes    Allergies  Allergen Reactions  . Alprazolam   . Codeine Nausea And Vomiting    Medication list has been reviewed and  updated.  No current outpatient prescriptions on file prior to visit.   No current facility-administered medications on file prior to visit.    Review of Systems:  As per HPI- otherwise negative.   Physical Examination: Filed Vitals:   01/07/15 1142  BP: 106/66  Pulse: 87  Temp: 98.8 F (37.1 C)  Resp: 16   Filed Vitals:   01/07/15 1142  Height: 5\' 7"  (1.702 m)  Weight: 171 lb (77.565 kg)   Body mass index is 26.78 kg/(m^2). Ideal Body Weight: Weight in (lb) to have BMI = 25: 159.3  GEN: WDWN, NAD, Non-toxic, A & O x 3, looks well, slight overweight HEENT: Atraumatic, Normocephalic. Neck supple. No masses.  Bilateral TM wnl, oropharynx normal.  PEERL,EOMI.   The right nasal cavity is inflamed with discharge.   She has tenderness in small  anterior cervical nodes in the right neck No palpable nodes in the right axilla but she is tender there to palpation Ears and Nose: No external deformity. CV: RRR, No M/G/R. No JVD. No thrill. No extra heart sounds. PULM: CTA B, no wheezes, crackles, rhonchi. No retractions. No resp. distress. No accessory muscle use. EXTR: No c/c/e NEURO Normal gait.  PSYCH: Normally interactive. Conversant. Not depressed or anxious appearing.  Calm demeanor.    Assessment and Plan: Acute frontal sinusitis, recurrence not specified - Plan: amoxicillin (AMOXIL) 500 MG capsule  Nonintractable episodic headache, unspecified headache type  Treat today for a sinus infection with amoxicillin Also may use advil as needed for aches and malaise Follow-up if not better soon  Signed Lamar Blinks, MD

## 2015-01-07 NOTE — Patient Instructions (Signed)
We are going to treat you for a sinus infection Go ahead and take advil (you can take up to 800 mg three times a day) as needed for the next few days Let us know if you are not feeling better soon, or if your symptoms are changing

## 2015-02-01 ENCOUNTER — Ambulatory Visit
Admission: RE | Admit: 2015-02-01 | Discharge: 2015-02-01 | Disposition: A | Discharge status: Home / Self Care | Payer: 59 | Source: Ambulatory Visit | Attending: Family Medicine | Admitting: Family Medicine

## 2015-02-01 DIAGNOSIS — N644 Mastodynia: Secondary | ICD-10-CM

## 2015-02-10 ENCOUNTER — Telehealth: Payer: Self-pay

## 2015-02-10 DIAGNOSIS — Z98818 Other dental procedure status: Secondary | ICD-10-CM

## 2015-02-10 MED ORDER — AMOXICILLIN 500 MG PO CAPS: ORAL_CAPSULE | ORAL | Status: DC

## 2015-02-10 NOTE — Telephone Encounter (Signed)
She may need an ABX before a procedure most likely.

## 2015-02-10 NOTE — Telephone Encounter (Signed)
Pt is having denta work done in the next few weeks and is wanting to talk with dr copland about pre medication with the dental work

## 2015-02-10 NOTE — Telephone Encounter (Signed)
Called her back; she had uses abx in the past for dental procedures due to spinal surgery in the past.  Her dentist would like to use prophylaxis prior to upcoming procedure out of caution.  She can use amoxicillin without difficulty.  Will rx this for her

## 2015-05-24 ENCOUNTER — Encounter: Payer: Self-pay | Admitting: Family Medicine

## 2015-05-24 ENCOUNTER — Ambulatory Visit (INDEPENDENT_AMBULATORY_CARE_PROVIDER_SITE_OTHER): Payer: BLUE CROSS/BLUE SHIELD | Admitting: Family Medicine

## 2015-05-24 VITALS — BP 122/76 | HR 85 | Temp 98.2°F | Resp 14 | Ht 67.0 in | Wt 170.2 lb

## 2015-05-24 DIAGNOSIS — R4789 Other speech disturbances: Secondary | ICD-10-CM

## 2015-05-24 DIAGNOSIS — S143XXD Injury of brachial plexus, subsequent encounter: Secondary | ICD-10-CM

## 2015-05-24 NOTE — Progress Notes (Signed)
Pre visit review using our clinic review tool, if applicable. No additional management support is needed unless otherwise documented below in the visit note. 

## 2015-05-24 NOTE — Progress Notes (Signed)
[facility] [address], [place] [address] [phone]  Date:  05/24/2015   Name:  Victoria Hall   DOB:  03/14/1971   MRN:  [id number]  PCP:  Lamar Blinks, MD    Chief Complaint: Neck Pain and Diarrhea   History of Present Illness:  Victoria Hall is a 45 y.o. very pleasant female patient who presents with the following:  She has had thoracic outlet issues in the past due to collapse of her cervical vertebrae years ago- she had surgery to repair this over 10 years ago. She has had some issues with her right arm off an on since then.  It is always her right arm that is affected.  She will have episodes of discomfort, weakness and tingling that usually last 3-5 days usually but can last longer.   They will then go away on their own  This time her sx have been present for a week which is a bit longer than usual for her.   She states that she is also having some "mild congnition issues" which she describes as occasional difficulty with word finding. Gives the example of forgetting the word "self-indulgent" the other day.  Otherwise no definite evidence of change in her thought processes She does not have any trouble with getting lost, no seizures noted.  She states that she has has these cognitive sx along with the arm discomfort also in the past.   She has seen a neurologist in [facility] and was told that she might have MS at some point years ago (it is not clear exactly when) - however she was found to NOT have MS on MRI and she was dx with a simple partial seizure disorder. She was on tripleptal for a while.  However this caused mentation difficutly so she stopped taking it  She has had some difficulty with diarrhea and then constipation over this last week- now resolved She was having some stomach pain but this was just with the diarrhea No vomiting, no fever  Patient Active Problem List   Diagnosis Date Noted  . MUSCLE SPASM, BACK 06/08/2009  .  HYDROSALPINX 02/07/2008  . HEADACHE 10/28/2007  . TACHYCARDIA 10/14/2007  . ANXIETY DISORDER 05/28/2007  . POLYARTHRITIS 05/09/2007  . UNSPECIFIED DISORDERS OF NERVOUS SYSTEM 04/22/2007  . ABDOMINAL PAIN RIGHT LOWER QUADRANT 04/08/2007  . PALPITATIONS, RECURRENT 11/07/2006  . PROBLEMS W/SMELL/TASTE 06/29/2006  . TOBACCO ABUSE 01/08/2006  . VISUAL IMPAIRMENT 01/08/2006  . DISTURBANCE, VISUAL NOS 01/08/2006  . FIBROCYSTIC BREAST DISEASE 01/08/2006  . [place] DISEASE, CERVICAL 01/08/2006  . VERTIGO 01/08/2006    Past Medical History  Diagnosis Date  . Palpitations   . Neurological abnormality     Neg workup with MRI/MRA in 2007 WNL except decreased caliber MCA proximally, distal cavernous portion of left LCA which may represent true stenosis or technique on exam. Evaluated by Dr Linus Salmons.  . Partial seizures ([place])     questionable diag  . Cervical intraepithelial neoplasia grade 3 2002    s/p LEEP Rx repeat pap negative  . S/P cervical discectomy     Dr Vertell Limber, anterior discectomy C5-C7  . Polycystic ovary     multiple ovarian cysts removed  . Fallopian tube disorder     right fallopian tube removed  . Hydrosalpinx     [facility]  . Seizures [facility])     Past Surgical History  Procedure Laterality Date  . Appendectomy    .  Tonsillectomy    . Bunionectomy      bunion removal  . Spine surgery      Social History  Substance Use Topics  . Smoking status: Current Every Day Smoker -- 0.50 packs/day for 10 years    Types: Cigarettes  . Smokeless tobacco: None  . Alcohol Use: 0.6 - 1.2 oz/week    1-2 Standard drinks or equivalent per week     Comment: occasionally    Family History  Problem Relation Age of Onset  . Adopted: Yes    Allergies  Allergen Reactions  . Alprazolam     Bruising  . Codeine Nausea And Vomiting    Medication list has been reviewed and updated.  Current Outpatient Prescriptions on File Prior to Visit  Medication Sig Dispense  Refill  . ibuprofen (ADVIL,MOTRIN) 200 MG tablet Take 200 mg by mouth every 6 (six) hours as needed.     No current facility-administered medications on file prior to visit.    Review of Systems:  As per HPI- otherwise negative.   Physical Examination: Filed Vitals:   05/24/15 1453  BP: 122/76  Pulse: 85  Temp: 98.2 F (36.8 C)  Resp: 14   Filed Vitals:   05/24/15 1453  Height: 5\' 7"  (1.702 m)  Weight: 170 lb 3.2 oz (77.202 kg)   Body mass index is 26.65 kg/(m^2). Ideal Body Weight: Weight in (lb) to have BMI = 25: 159.3  GEN: WDWN, NAD, Non-toxic, A & O x 3, looks well HEENT: Atraumatic, Normocephalic. Neck supple. No masses, No LAD.  Bilateral TM wnl, oropharynx normal.  PEERL,EOMI.   Ears and Nose: No external deformity. CV: RRR, No M/G/R. No JVD. No thrill. No extra heart sounds. PULM: CTA B, no wheezes, crackles, rhonchi. No retractions. No resp. distress. No accessory muscle use. ABD: S, NT, ND, +BS. No rebound. No HSM.  Benign belly EXTR: No c/c/e NEURO Normal gait.  PSYCH: Normally interactive. Conversant. Not depressed or anxious appearing.  Calm demeanor.  RUE has 4/5 strength at the biceps and deltoids.  Otherwise 5/5 strength on the right, LUE with 5/5 strength throughout.  Reduced right biceps jerk.   No wasting or NV compromise of the right arm/ hand She notes a tingling feeling in both of her UE to touch but no difference between the right and left The rest of her neuro exam is normal, normal tandem gait and romberg testing  No apparent cognitive deficit during exam  Assessment and Plan: Brachial plexus injury, right, subsequent encounter  Word finding difficulty - Plan: Ambulatory referral to Neurology  History of injury to the right brachial plexus which seems to have intermittent exacerbations- she is experiencing one now. Explained that I do not think her arm symptoms are related to any cognitive concerns.  However as she has not seen neurology in  several years she would like to have a consultation which is reasonable.  Discussed doing an MRI of her neck as well.  At this time she is not interested in more invasive treatment of her occasional arm sx and does not want to do the MRI right now at least.  She does not wish to use any medications such as prednisone either as her sx generally go away on their own  Signed Lamar Blinks, MD

## 2015-05-24 NOTE — Patient Instructions (Signed)
I will refer you to a neurologist to revisit the possibility of MS for you.   If you end up needing me to order an MRI of your neck please let me know and I will happy to do so Let me know if the symptoms in your arm do not go away as they usually do or if you have any other concerns

## 2015-05-27 ENCOUNTER — Ambulatory Visit (INDEPENDENT_AMBULATORY_CARE_PROVIDER_SITE_OTHER): Payer: BLUE CROSS/BLUE SHIELD | Admitting: Family Medicine

## 2015-05-27 VITALS — BP 110/78 | HR 92 | Temp 98.2°F | Ht 67.0 in | Wt 172.4 lb

## 2015-05-27 DIAGNOSIS — R059 Cough, unspecified: Secondary | ICD-10-CM

## 2015-05-27 DIAGNOSIS — R05 Cough: Secondary | ICD-10-CM

## 2015-05-27 DIAGNOSIS — R5381 Other malaise: Secondary | ICD-10-CM

## 2015-05-27 DIAGNOSIS — R52 Pain, unspecified: Secondary | ICD-10-CM

## 2015-05-27 DIAGNOSIS — R5383 Other fatigue: Secondary | ICD-10-CM | POA: Diagnosis not present

## 2015-05-27 LAB — CBC
HCT: 40.9 % (ref 36.0–46.0)
Hemoglobin: 13.7 g/dL (ref 12.0–15.0)
MCHC: 33.6 g/dL (ref 30.0–36.0)
MCV: 84 fl (ref 78.0–100.0)
Platelets: 258 10*3/uL (ref 150.0–400.0)
RBC: 4.87 Mil/uL (ref 3.87–5.11)
RDW: 14.1 % (ref 11.5–15.5)
WBC: 10.8 10*3/uL — ABNORMAL HIGH (ref 4.0–10.5)

## 2015-05-27 LAB — POCT RAPID STREP A (OFFICE): Rapid Strep A Screen: NEGATIVE

## 2015-05-27 LAB — POCT INFLUENZA A/B
Influenza A, POC: NEGATIVE
Influenza B, POC: NEGATIVE

## 2015-05-27 MED ORDER — OSELTAMIVIR PHOSPHATE 75 MG PO CAPS
75.0000 mg | ORAL_CAPSULE | Freq: Two times a day (BID) | ORAL | Status: DC
Start: 2015-05-27 — End: 2015-05-31

## 2015-05-27 NOTE — Progress Notes (Signed)
[place] at [facility] [address], [place], [place] [address] [phone]  Date:  05/27/2015   Name:  Victoria Hall   DOB:  Jan 02, 1971   MRN:  [id number]  PCP:  Lamar Blinks, MD    Chief Complaint: Cough   History of Present Illness:  Victoria Hall is a 45 y.o. very pleasant female patient who presents with the following:  I just saw this pt recently for another issue- she is here today with complaint of illness.  She first felt ill last night- she has noted a ST, low grade temperature up to 100.5.  She does have some cough She has PND, her ears and throat are itchy +Body aches, headache, fatigue No GI symptoms She is using some advil- she did take this today prior to visit  Patient Active Problem List   Diagnosis Date Noted  . MUSCLE SPASM, BACK 06/08/2009  . HYDROSALPINX 02/07/2008  . HEADACHE 10/28/2007  . TACHYCARDIA 10/14/2007  . ANXIETY DISORDER 05/28/2007  . POLYARTHRITIS 05/09/2007  . UNSPECIFIED DISORDERS OF NERVOUS SYSTEM 04/22/2007  . ABDOMINAL PAIN RIGHT LOWER QUADRANT 04/08/2007  . PALPITATIONS, RECURRENT 11/07/2006  . PROBLEMS W/SMELL/TASTE 06/29/2006  . TOBACCO ABUSE 01/08/2006  . VISUAL IMPAIRMENT 01/08/2006  . DISTURBANCE, VISUAL NOS 01/08/2006  . FIBROCYSTIC BREAST DISEASE 01/08/2006  . [place] DISEASE, CERVICAL 01/08/2006  . VERTIGO 01/08/2006    Past Medical History  Diagnosis Date  . Palpitations   . Neurological abnormality     Neg workup with MRI/MRA in 2007 WNL except decreased caliber MCA proximally, distal cavernous portion of left LCA which may represent true stenosis or technique on exam. Evaluated by Dr Linus Salmons.  . Partial seizures ([place])     questionable diag  . Cervical intraepithelial neoplasia grade 3 2002    s/p LEEP Rx repeat pap negative  . S/P cervical discectomy     Dr Vertell Limber, anterior discectomy C5-C7  . Polycystic ovary     multiple ovarian cysts removed  .  Fallopian tube disorder     right fallopian tube removed  . Hydrosalpinx     [facility]  . Seizures [facility])     Past Surgical History  Procedure Laterality Date  . Appendectomy    . Tonsillectomy    . Bunionectomy      bunion removal  . Spine surgery      Social History  Substance Use Topics  . Smoking status: Current Every Day Smoker -- 0.50 packs/day for 10 years    Types: Cigarettes  . Smokeless tobacco: Not on file  . Alcohol Use: 0.6 - 1.2 oz/week    1-2 Standard drinks or equivalent per week     Comment: occasionally    Family History  Problem Relation Age of Onset  . Adopted: Yes    Allergies  Allergen Reactions  . Alprazolam     Bruising  . Codeine Nausea And Vomiting    Medication list has been reviewed and updated.  Current Outpatient Prescriptions on File Prior to Visit  Medication Sig Dispense Refill  . ibuprofen (ADVIL,MOTRIN) 200 MG tablet Take 200 mg by mouth every 6 (six) hours as needed.    . cyclobenzaprine (FLEXERIL) 5 MG tablet Take 5 mg by mouth 3 (three) times daily as needed for muscle spasms. Reported on 05/27/2015     No current facility-administered medications on file prior to visit.    Review of Systems:  As per  HPI- otherwise negative.   Physical Examination: Filed Vitals:   05/27/15 1307  BP: 110/78  Pulse: 92  Temp: 98.2 F (36.8 C)   Filed Vitals:   05/27/15 1307  Height: 5\' 7"  (1.702 m)  Weight: 172 lb 6.4 oz (78.2 kg)   Body mass index is 27 kg/(m^2). Ideal Body Weight: Weight in (lb) to have BMI = 25: 159.3  GEN: WDWN, NAD, Non-toxic, A & O x 3, looks well HEENT: Atraumatic, Normocephalic. Neck supple. No masses, No LAD. Bilateral TM wnl, oropharynx normal.  PEERL,EOMI.   Ears and Nose: No external deformity. CV: RRR, No M/G/R. No JVD. No thrill. No extra heart sounds. PULM: CTA B, no wheezes, crackles, rhonchi. No retractions. No resp. distress. No accessory muscle use. EXTR: No c/c/e NEURO  Normal gait.  PSYCH: Normally interactive. Conversant. Not depressed or anxious appearing.  Calm demeanor.   Results for orders placed or performed in visit on 05/27/15  CBC  Result Value Ref Range   WBC 10.8 (H) 4.0 - 10.5 K/uL   RBC 4.87 3.87 - 5.11 Mil/uL   Platelets 258.0 150.0 - 400.0 K/uL   Hemoglobin 13.7 12.0 - 15.0 g/dL   HCT 40.9 36.0 - 46.0 %   MCV 84.0 78.0 - 100.0 fl   MCHC 33.6 30.0 - 36.0 g/dL   RDW 14.1 11.5 - 15.5 %  POCT Influenza A/B  Result Value Ref Range   Influenza A, POC Negative Negative   Influenza B, POC Negative Negative  POCT rapid strep A  Result Value Ref Range   Rapid Strep A Screen Negative Negative    Assessment and Plan: Body aches - Plan: POCT Influenza A/B, POCT rapid strep A, oseltamivir (TAMIFLU) 75 MG capsule, CBC  Cough - Plan: POCT Influenza A/B, oseltamivir (TAMIFLU) 75 MG capsule, CBC  Malaise and fatigue - Plan: oseltamivir (TAMIFLU) 75 MG capsule, CBC  Possible flu- her sx are suggestive although rapid test is negative.  Her CBC is non -specific.  She would like to use tamiflu as it may help- this is reasonable.  She will rest and let me know if not feeling better soon  Signed Lamar Blinks, MD Sent her a mychart message about her CBC

## 2015-05-27 NOTE — Patient Instructions (Signed)
You may have the flu although your test is negative Rest and drink plenty of fluids- use OTC medications as needed Take the tamiflu twice a day as directed I will be in touch with your CBC results today

## 2015-05-27 NOTE — Progress Notes (Signed)
Pre visit review using our clinic review tool, if applicable. No additional management support is needed unless otherwise documented below in the visit note. 

## 2015-05-31 ENCOUNTER — Telehealth: Payer: Self-pay | Admitting: Family Medicine

## 2015-05-31 DIAGNOSIS — J0110 Acute frontal sinusitis, unspecified: Secondary | ICD-10-CM

## 2015-05-31 MED ORDER — AMOXICILLIN 500 MG PO CAPS: 1000.0000 mg | ORAL_CAPSULE | Freq: Two times a day (BID) | ORAL | Status: DC

## 2015-05-31 NOTE — Telephone Encounter (Signed)
Called her back- she reports that she seems to have a sinus infection. She decided not to use the tamiflu. Her sx now consist of pain and pressure in her right frontal sinuses and her teeth are painful.  This reminds her of sinus infections she has had in the past.  Will treat with amox and she will let me know if not better She does not suspect current pregnancy

## 2015-05-31 NOTE — Telephone Encounter (Signed)
Relation to [id number] Call back [place]: [place] [address] - , [place] - [place] [phone] ([phone] (Fax)         Reason for call:  Patient last seen 05/27/2015 and states she's experiecing a lot of pressure in face. Patient states she did not fill  oseltamivir (TAMIFLU) 75 MG capsule because she was scheduled for oral surgery today and they would prescribe dexamethasone. Patient is currently not taking dexamethasone because she had to [facility] dental appointment because she's sick. Please advise

## 2015-06-02 ENCOUNTER — Other Ambulatory Visit: Payer: Self-pay | Admitting: Family Medicine

## 2015-06-02 DIAGNOSIS — J0110 Acute frontal sinusitis, unspecified: Secondary | ICD-10-CM

## 2015-06-02 MED ORDER — AMOXICILLIN 500 MG PO CAPS: 1000.0000 mg | ORAL_CAPSULE | Freq: Two times a day (BID) | ORAL | Status: DC

## 2015-06-14 ENCOUNTER — Telehealth: Payer: Self-pay | Admitting: Family Medicine

## 2015-06-14 DIAGNOSIS — B3731 Acute candidiasis of vulva and vagina: Secondary | ICD-10-CM

## 2015-06-14 DIAGNOSIS — B373 Candidiasis of vulva and vagina: Secondary | ICD-10-CM

## 2015-06-14 MED ORDER — FLUCONAZOLE 150 MG PO TABS
150.0000 mg | ORAL_TABLET | Freq: Once | ORAL | Status: DC
Start: 2015-06-14 — End: 2015-06-21

## 2015-06-14 NOTE — Telephone Encounter (Signed)
Pharmacy: [id number] - [place], [place]  Reason for call: pt states the abx have given her a yeast infection. She is requesting diflucan be sent in for her.

## 2015-06-21 ENCOUNTER — Ambulatory Visit (INDEPENDENT_AMBULATORY_CARE_PROVIDER_SITE_OTHER): Payer: BLUE CROSS/BLUE SHIELD | Admitting: Neurology

## 2015-06-21 ENCOUNTER — Other Ambulatory Visit: Payer: Self-pay

## 2015-06-21 ENCOUNTER — Encounter: Payer: Self-pay | Admitting: Neurology

## 2015-06-21 VITALS — BP 110/60 | HR 60 | Ht 66.0 in | Wt 177.0 lb

## 2015-06-21 DIAGNOSIS — R29818 Other symptoms and signs involving the nervous system: Secondary | ICD-10-CM

## 2015-06-21 DIAGNOSIS — R29898 Other symptoms and signs involving the musculoskeletal system: Secondary | ICD-10-CM | POA: Diagnosis not present

## 2015-06-21 DIAGNOSIS — G540 Brachial plexus disorders: Secondary | ICD-10-CM

## 2015-06-21 DIAGNOSIS — M5010 Cervical disc disorder with radiculopathy, unspecified cervical region: Secondary | ICD-10-CM

## 2015-06-21 DIAGNOSIS — F17200 Nicotine dependence, unspecified, uncomplicated: Secondary | ICD-10-CM
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
## 2015-06-21 NOTE — Patient Instructions (Signed)
1.  Will check MRI of cervical spine with and without contrast to evaluate for radiculopathy.  If unrevealing, will get nerve study  2.  Will check EEG to assess for increased risk for seizures 3.  Follow up

## 2015-06-28 ENCOUNTER — Ambulatory Visit (INDEPENDENT_AMBULATORY_CARE_PROVIDER_SITE_OTHER): Payer: BLUE CROSS/BLUE SHIELD | Admitting: Neurology

## 2015-06-28 DIAGNOSIS — R29818 Other symptoms and signs involving the nervous system: Secondary | ICD-10-CM

## 2015-06-28 NOTE — Procedures (Signed)
ELECTROENCEPHALOGRAM REPORT  Date of Study: 06/28/2015  Patient's Name: Victoria Hall MRN: [id number] Date of Birth: December 18, 1970  Indication: 45 year old female with spells consisting of hiccups, metallic taste in mouth and deja vu  Medications: Advil  Technical Summary: This is a multichannel digital EEG recording, using the international 10-20 placement system with electrodes applied with paste and impedances below 5000 ohms.    Description: The EEG background is symmetric, with a well-developed posterior dominant rhythm of 9 Hz, which is reactive to eye opening and closing.  Diffuse beta activity is seen, with a bilateral frontal preponderance.  No focal or generalized abnormalities are seen.  No focal or generalized epileptiform discharges are seen.  Stage II sleep is not seen.  Hyperventilation and photic stimulation were performed, and produced no abnormalities.  ECG revealed normal cardiac rate and rhythm.  Impression: This is a normal routine EEG of the awake and drowsy states, with activating procedures.  A normal study does not rule out the possibility of a seizure disorder in this patient.  Adam R. Tomi Likens, DO

## 2015-06-29 ENCOUNTER — Telehealth: Payer: Self-pay

## 2015-06-29 ENCOUNTER — Ambulatory Visit
Admission: RE | Admit: 2015-06-29 | Discharge: 2015-06-29 | Disposition: A | Discharge status: Home / Self Care | Payer: BLUE CROSS/BLUE SHIELD | Source: Ambulatory Visit | Attending: Neurology | Admitting: Neurology

## 2015-06-29 DIAGNOSIS — M5010 Cervical disc disorder with radiculopathy, unspecified cervical region: Secondary | ICD-10-CM

## 2015-06-29 NOTE — Telephone Encounter (Signed)
-----   Message from Pieter Partridge, DO sent at 06/29/2015  6:48 AM EDT ----- EEG is normal

## 2015-06-29 NOTE — Telephone Encounter (Signed)
Sent via mychart

## 2015-06-30 ENCOUNTER — Telehealth: Payer: Self-pay

## 2015-06-30 DIAGNOSIS — R29898 Other symptoms and signs involving the musculoskeletal system: Secondary | ICD-10-CM

## 2015-06-30 NOTE — Telephone Encounter (Signed)
Results were left on pt's voicemail, with instructions to call back with any questions or concerns in relation to results.   

## 2015-06-30 NOTE — Telephone Encounter (Signed)
-----   Message from Pieter Partridge, DO sent at 06/30/2015  7:18 AM EDT ----- MRI shows degenerative changes in the spine.  I would like to proceed with NCV-EMG of right upper extremity

## 2015-07-06 ENCOUNTER — Ambulatory Visit (INDEPENDENT_AMBULATORY_CARE_PROVIDER_SITE_OTHER): Payer: BLUE CROSS/BLUE SHIELD | Admitting: Neurology

## 2015-07-06 ENCOUNTER — Telehealth: Payer: Self-pay

## 2015-07-06 DIAGNOSIS — R29898 Other symptoms and signs involving the musculoskeletal system: Secondary | ICD-10-CM | POA: Diagnosis not present

## 2015-07-06 DIAGNOSIS — M5412 Radiculopathy, cervical region: Secondary | ICD-10-CM

## 2015-07-06 NOTE — Telephone Encounter (Signed)
-----   Message from Pieter Partridge, DO sent at 07/06/2015 11:09 AM EDT ----- Nerve study just shows findings of old injury to the nerve from the neck that goes to the shoulder.  The MRI of the cervical spine did show tightening in the space where that nerve comes out.  It could be contributing to shoulder weakness.  I would recommend that she follow up with her spine surgeon for re-evaluation.

## 2015-07-06 NOTE — Procedures (Signed)
[facility]  [place], [place]  [place], [place] [address] Tel: [phone] Fax:  [phone] Test Date:  07/06/2015  Patient: Victoria Hall DOB: 10-06-1970 Physician: Narda Amber, DO  Sex: Female Height: 5\' 7"  Ref Phys: Metta Clines  ID#: [id number] Temp: 33.6C Technician: Jerilynn Mages. Dean   Patient Complaints: This is a 45 year old female with history of cervical decompression and fusion at C5-7 referred for evaluation of right-sided neck pain, weakness, and paresthesias.  NCV & EMG Findings: Extensive electrodiagnostic testing of the right upper extremity shows: 1. Right median, ulnar, medial antebrachial, and lateral antebrachial cutaneous sensory responses are within normal limits.  Mixed palmar sensory responses are within normal limits. 2. Right median and ulnar motor responses are within normal limits. 3. Chronic motor axon loss changes are seen affecting the deltoid and infraspinatus muscles, without accompanied active denervation.  Impression: 1. Chronic C5 radiculopathy affecting the right upper extremity, mild in degree electrically. 2. There is no evidence of thoracic outlet syndrome, carpal tunnel syndrome, or brachial plexopathy affecting the right upper extremity.   ___________________________ Narda Amber, DO    Nerve Conduction Studies Anti Sensory Summary Table   Site NR Peak (ms) Norm Peak (ms) P-T Amp (V) Norm P-T Amp  Right Lat Ante Brach [place] Anti Sensory (Lat Forearm)  Lat Biceps    2.3 <2.9 8.3   Right Med Ante Brach [place] Anti Sensory (Med Forearm)  Elbow    2.3  8.9   Right Median Anti Sensory (2nd Digit)  Wrist    2.8 <3.4 34.0 >20  Right Ulnar Anti Sensory (5th Digit)  Wrist    2.9 <3.1 39.3 >12   Motor Summary Table   Site NR Onset (ms) Norm Onset (ms) O-P Amp (mV) Norm O-P Amp Site1 Site2 Delta-0 (ms) Dist (cm) Vel (m/s) Norm Vel (m/s)  Right Median Motor (Abd Poll Brev)  Wrist    3.4 <3.9 8.3 >6 Elbow Wrist 3.9 23.0 59 >50    Elbow    7.3  8.2         Right Ulnar Motor (Abd Dig Minimi)  Wrist    2.3 <3.1 13.5 >7 B Elbow Wrist 3.4 20.0 59 >50  B Elbow    5.7  12.7  A Elbow B Elbow 1.6 10.0 63 >50  A Elbow    7.3  12.1          Comparison Summary Table   Site NR Peak (ms) Norm Peak (ms) P-T Amp (V) Site1 Site2 Delta-P (ms) Norm Delta (ms)  Right Median/Ulnar Palm Comparison (Wrist - 8cm)  Median Palm    1.7 <2.2 102.9 Median Palm Ulnar Palm 0.0   Ulnar Palm    1.7 <2.2 65.6       EMG   Side Muscle Ins Act Fibs Psw Fasc Number Recrt Dur Dur. Amp Amp. Poly Poly. Comment  Right 1stDorInt Nml Nml Nml Nml Nml Nml Nml Nml Nml Nml Nml Nml N/A  Right Ext Indicis Nml Nml Nml Nml Nml Nml Nml Nml Nml Nml Nml Nml N/A  Right PronatorTeres Nml Nml Nml Nml Nml Nml Nml Nml Nml Nml Nml Nml N/A  Right Biceps Nml Nml Nml Nml Nml Nml Nml Nml Nml Nml Nml Nml N/A  Right Triceps Nml Nml Nml Nml Nml Nml Nml Nml Nml Nml Nml Nml N/A  Right Deltoid Nml Nml Nml Nml 1- Mod-R Few 1+ Few 1+ Nml Nml N/A  Right Infraspinatus Nml Nml Nml Nml 1- Mod-R Few  1+ Few 1+ Nml Nml N/A      Waveforms:

## 2015-07-06 NOTE — Telephone Encounter (Signed)
Message relayed to patient. Verbalized understanding and denied questions. Pt needed referral to [place], where she had her previous surgery, due to amount of time that had gone by since last visit. Referral sent.

## 2015-07-09 ENCOUNTER — Telehealth: Payer: Self-pay | Admitting: Neurology

## 2015-07-09 NOTE — Telephone Encounter (Signed)
Pt wants to check the status of the neurosurgery referral please call her at [phone]

## 2015-07-09 NOTE — Telephone Encounter (Signed)
Referral was sent on 07/06/15 to [facility]. Left pt message w/ general info about process. Told to call back if she had more question.

## 2015-07-22 ENCOUNTER — Telehealth: Payer: Self-pay

## 2015-07-22 DIAGNOSIS — M5010 Cervical disc disorder with radiculopathy, unspecified cervical region: Secondary | ICD-10-CM

## 2015-07-22 DIAGNOSIS — R29898 Other symptoms and signs involving the musculoskeletal system: Secondary | ICD-10-CM

## 2015-07-22 NOTE — Telephone Encounter (Signed)
Maybe we can contact them to see what is going on.  Alternatively, she can see another surgeon as well.  [facility] is the only game in town.  Otherwise, we would have to refer somewhere else, such as [facility].  I would think that the home traction unit would help with neck pain, but not weakness in the arm.  PT may be beneficial.

## 2015-07-22 NOTE — Telephone Encounter (Signed)
Pt called. Reported having difficulty making contact w/ Dr. Melven Sartorius receptionist/nurse at [facility] to schedule. Pt states since she was a previous pt (many years ago), she's been given the run around. Pt would like to know if there is anywhere else we can have her scheduled? Pt also wanted to know if you thought a home traction unit would be beneficial/okay. Please advise.   [id number]

## 2015-07-22 NOTE — Telephone Encounter (Signed)
Spoke w/ patient. Would like referral elsewhere. Sent to [facility]. Phone [phone]. Pt also interested in PT. PT order placed to [facility].

## 2015-07-26 ENCOUNTER — Telehealth: Payer: Self-pay

## 2015-07-26 NOTE — Telephone Encounter (Signed)
From what I have heard, Dr. Wende Mott at [facility] is excellent.  He is a respected Psychologist, sport and exercise and well-liked by patients.

## 2015-07-26 NOTE — Telephone Encounter (Signed)
    [facility] Department of Neurosurgery - Dr. Wende Mott [address] [place], [place] [address] Get Directions [phone]

## 2015-07-26 NOTE — Telephone Encounter (Signed)
Referral was sent to [facility]  After [facility] had not returned pt's repeated attempts to schedule (found out later due to provider on vacation). [facility] left message that after provider review they would not be able to see patient and thought pt would best be suited to be seen at a [facility] facilty due to complexity. Do you have a preferred area university to send pt to? Please advise.

## 2015-07-29 ENCOUNTER — Ambulatory Visit: Payer: BLUE CROSS/BLUE SHIELD | Attending: Neurology | Admitting: Rehabilitative and Restorative Service Providers"

## 2015-07-29 DIAGNOSIS — M542 Cervicalgia: Secondary | ICD-10-CM | POA: Insufficient documentation

## 2015-07-29 DIAGNOSIS — M5412 Radiculopathy, cervical region: Secondary | ICD-10-CM | POA: Insufficient documentation

## 2015-07-29 DIAGNOSIS — M6281 Muscle weakness (generalized): Secondary | ICD-10-CM | POA: Diagnosis not present

## 2015-07-29 DIAGNOSIS — M25511 Pain in right shoulder: Secondary | ICD-10-CM | POA: Insufficient documentation
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
## 2015-07-29 NOTE — Patient Instructions (Signed)
Head Press With Clay chin SLIGHTLY toward chest, keep mouth closed. Feel weight on back of head. Increase weight by pressing head down. Hold _3__ seconds. Relax. Repeat 10___ times. Surface: floor or bed with one pillow.  Copyright  VHI. All rights reserved.  Healthy Back - Shoulder Roll    Stand straight with arms relaxed at sides. Roll shoulders backward continuously. Do __10__ times.  Copyright  VHI. All rights reserved.

## 2015-08-10 DIAGNOSIS — M25521 Pain in right elbow: Secondary | ICD-10-CM | POA: Diagnosis not present

## 2015-08-10 DIAGNOSIS — M7701 Medial epicondylitis, right elbow: Secondary | ICD-10-CM | POA: Diagnosis not present

## 2015-08-11 ENCOUNTER — Ambulatory Visit: Payer: BLUE CROSS/BLUE SHIELD | Attending: Neurology | Admitting: Rehabilitative and Restorative Service Providers"

## 2015-08-11 DIAGNOSIS — M25511 Pain in right shoulder: Secondary | ICD-10-CM | POA: Insufficient documentation

## 2015-08-11 DIAGNOSIS — M6281 Muscle weakness (generalized): Secondary | ICD-10-CM | POA: Insufficient documentation

## 2015-08-11 DIAGNOSIS — M542 Cervicalgia: Secondary | ICD-10-CM | POA: Diagnosis not present

## 2015-08-11 DIAGNOSIS — M5412 Radiculopathy, cervical region: Secondary | ICD-10-CM | POA: Diagnosis not present

## 2015-08-11 NOTE — Patient Instructions (Signed)
Thoracic Self-Mobilization (Supine)    With rolled towel placed lengthwise at lower ribs level, lie back on towel with arms outstretched. Hold _2 minutes. Relax. Repeat _1___ times per set.  Do __2__ sessions per day.  http://orth.exer.us/1001   Copyright  VHI. All rights reserved.  (Clinic) External Rotation: Supine    Opposite side toward pulley, left arm supported, elbow out, bent to 90, forearm across body. Rotate shoulder by pulling hand away from body. Keep elbow bent to 90. Do not allow elbow to move further away from body.  USE YELLOW THERABAND* Repeat __5__ times per set, CAN WORK UP TO 10 TIMES. Do _1___ sets per session. Do _5___ sessions per week.   Copyright  VHI. All rights reserved.  Supine: Chest Press (Active)    Lie on back and reach towards ceiling with right arm.  5 times, working up to 10 times as tolerable.  Perform _2__ sessions per day.  Copyright  VHI. All rights reserved.  Neurovascular: Median Nerve Glide With Cervical Bias    Stand with right arm out to side, palm flat against wall, thumb up, elbow straight. Slowly move opposite side ear toward shoulder as far as possible without pain. Repeat __3__ times per set. Do __2__ sessions per week. *Perform only tolerance, especially on right side.   Copyright  VHI. All rights reserved.    FOR OTHER EXERCISES: Avoid overhead reaching during tasks.

## 2015-08-11 NOTE — Therapy (Signed)
[facility] [address] [place], [place], [address] Phone: [phone]   Fax:  [phone]  Physical Therapy Treatment  Patient Details  Name: Victoria Hall MRN: [id number] Date of Birth: [birth year] Referring Provider: Metta Clines, MD  Encounter Date: 08/11/2015      PT End of Session - 08/11/15 0940    Visit Number 2   Number of Visits 12   Date for PT Re-Evaluation 09/27/15   Authorization Type Private insurance, patient feels limited due to co-pay + coinsurance with plan   PT Start Time 0850   PT Stop Time 0935   PT Time Calculation (min) 45 min   Activity Tolerance Patient tolerated treatment well   Behavior During Therapy [facility] for tasks assessed/performed      Past Medical History  Diagnosis Date  . Palpitations   . Neurological abnormality     Neg workup with MRI/MRA in 2007 WNL except decreased caliber MCA proximally, distal cavernous portion of left LCA which may represent true stenosis or technique on exam. Evaluated by Dr Linus Salmons.  . Partial seizures ([place])     questionable diag  . Cervical intraepithelial neoplasia grade 3 2002    s/p LEEP Rx repeat pap negative  . S/P cervical discectomy     Dr Vertell Limber, anterior discectomy C5-C7  . Polycystic ovary     multiple ovarian cysts removed  . Fallopian tube disorder     right fallopian tube removed  . Hydrosalpinx     [facility]  . Seizures [facility])     Past Surgical History  Procedure Laterality Date  . Appendectomy    . Tonsillectomy    . Bunionectomy      bunion removal  . Spine surgery      There were no vitals filed for this visit.      Subjective Assessment - 08/11/15 0854    Subjective The patient reports that since intiial evaluation she has been diagnosed with right cubital tunnel compression.  She is getting further evaluation to determine if she is need of surgery for cubital tunnel release.    Patient Stated Goals  improve use of right arm, return to work, reduce pain.   Currently in Pain? Yes   Pain Score 3    Pain Location Neck   Pain Orientation Right   Pain Descriptors / Indicators Aching   Pain Type Chronic pain   Pain Onset More than a month ago   Pain Frequency Constant   Aggravating Factors  sitting in one place for too long, turning senses grinding in upper neck   Pain Relieving Factors positioning, moving frequently              [facility] Adult PT Treatment/Exercise - 08/11/15 0942    Self-Care   Self-Care Other Self-Care Comments   Other Self-Care Comments  Patient demo'd prior HEP and PT recommended reduce pull during stretching to avoid pain and bringing all movements down below 90 deg of abduction due to pain >90 deg.   Exercises   Exercises Neck;Shoulder;Elbow;Wrist   Neck Exercises: Supine   Neck Retraction 5 reps  reviewed HEP   Other Supine Exercise passive ROM into rotation with flexion for stretching, mild suboccipital release soft tissue musculature using flexion stretch and return to neutral position.   Other Supine Exercise Towel roll stretch for thoracic self mobilization   Neck Exercises: Prone   Other Prone Exercise Prone on elbows, prone scapular retraction with arms at  neutral x 5 reps with pain in neck.     Shoulder Exercises: Supine   External Rotation 5 reps;Right;Left   Theraband Level (Shoulder External Rotation) Level 1 (Yellow)   External Rotation Limitations one UE at a time for focus on moving through full ROM   Other Supine Exercises Supine chest press x 5 reps without weight R UE,   Shoulder Exercises: Seated   Retraction Right;Left  Attempted "W" x 3 reps with significant increase in pain.   Shoulder Exercises: Stretch   Wall Stretch - ABduction --  Standing neural glide with cervical ROM to tolerance                PT Education - 08/11/15 0902    Education provided Yes   Education Details Patient demo'd prior HEP from PT (thoracic  towel roll stretch, trunk rotation, levator stretch, door frame stretch); Provided guidance on activities below 90 deg shldr flexion/abd, added HEP: towel roll stretch, supine ER shoulder with yellow t-band, door frame neural glide, chest press supine)   Person(s) Educated Patient   Methods Explanation;Demonstration;Handout   Comprehension Returned demonstration;Verbalized understanding          PT Short Term Goals - 07/29/15 1006    PT SHORT TERM GOAL #1   Title The patient will be indep with HEP for cervical spine stabilization, flexibility, and postural strengthening.   Baseline Target date 08/28/2015   Time 4   Period Weeks   PT SHORT TERM GOAL #2   Title The patient will improve A/ROM R cervical rotaiton from 44 deg to 50 degrees to demo improving flexibility.   Baseline Target date 08/28/2015   Time 4   Period Weeks   PT SHORT TERM GOAL #3   Title The patient will perform neck flexion with pain not increasing from baseline level.   Baseline Target date 08/28/2015   Time 4   Period Weeks   PT SHORT TERM GOAL #4   Title The patient will tolerate right shoulder flexion >120 degrees without increasing pain from baseline level.   Baseline Target date 08/28/2015   Time 4   Period Weeks           PT Long Term Goals - 07/29/15 1010    PT LONG TERM GOAL #1   Title The patient will reduce neck disability index from 52% to < or equal to 40% to demo improved self perception of deficits.   Baseline Target date: 09/27/2015   Time 8   Period Weeks   PT LONG TERM GOAL #2   Title The patient will report being able to type for 20 minutes with correct body mechanics/positioning.   Baseline Target date: 09/27/2015   Time 8   Period Weeks   PT LONG TERM GOAL #3   Title The patient will report pain at worst level to be < or equal to 5/10 for reduction in severity of symptoms.   Baseline Target date: 09/27/2015   Time 8   Period Weeks   PT LONG TERM GOAL #4   Title The patient will  improve R UE shoulder shrug to 4/5 (from 3/5 baseline).   Baseline Target date: 09/27/2015   Time 8   Period Weeks   PT LONG TERM GOAL #5   Title The patient will improve R shoulder abduction to > or equal to 4/5 (from 3/5 baseline).   Baseline Target date: 09/27/2015   Time 8   Period Weeks   Additional Long Term Goals  Additional Long Term Goals Yes   PT LONG TERM GOAL #6   Title The patient will report return to work at reduced hours with self mgmt of pain/discomfort.   Baseline Target date: 09/27/2015   Time 8   Period Weeks               Plan - 08/11/15 [id number]    Clinical Impression Statement The patient had recent evaluation for right elbow with further testing to come.  PT provided postural stabilization, stretching for HEP and will progress to patient tolerance.  Continue to STGs/LTGs.   PT Treatment/Interventions ADLs/Self Care Home Management;Cryotherapy;Moist Heat;Therapeutic exercise;Manual techniques;Therapeutic activities;Functional mobility training;Patient/family education;Neuromuscular re-education   PT Next Visit Plan Postural stabilization, neural gliding R UE, neck A/ROM, mid thoracic self mobilization   Consulted and Agree with Plan of Care Patient      Patient will benefit from skilled therapeutic intervention in order to improve the following deficits and impairments:  Decreased activity tolerance, Decreased mobility, Decreased strength, Postural dysfunction, Impaired sensation, Pain, Improper body mechanics, Decreased range of motion  Visit Diagnosis: Radiculopathy, cervical region  Pain in right shoulder  Cervicalgia  Muscle weakness (generalized)     Problem List Patient Active Problem List   Diagnosis Date Noted  . MUSCLE SPASM, BACK 06/08/2009  . HYDROSALPINX 02/07/2008  . HEADACHE 10/28/2007  . TACHYCARDIA 10/14/2007  . ANXIETY DISORDER 05/28/2007  . POLYARTHRITIS 05/09/2007  . UNSPECIFIED DISORDERS OF NERVOUS SYSTEM 04/22/2007  .  ABDOMINAL PAIN RIGHT LOWER QUADRANT 04/08/2007  . PALPITATIONS, RECURRENT 11/07/2006  . PROBLEMS W/SMELL/TASTE 06/29/2006  . TOBACCO ABUSE 01/08/2006  . VISUAL IMPAIRMENT 01/08/2006  . DISTURBANCE, VISUAL NOS 01/08/2006  . FIBROCYSTIC BREAST DISEASE 01/08/2006  . [place] DISEASE, CERVICAL 01/08/2006  . VERTIGO 01/08/2006    Taneil Lazarus, PT 08/11/2015, 9:48 AM  [facility] [address] [place], [place], [address] Phone: [phone]   Fax:  [phone]  Name: Victoria Hall MRN: [id number] Date of Birth: 1970-12-02

## 2015-08-13 ENCOUNTER — Ambulatory Visit: Payer: BLUE CROSS/BLUE SHIELD | Admitting: Rehabilitative and Restorative Service Providers"

## 2015-08-13 DIAGNOSIS — M542 Cervicalgia: Secondary | ICD-10-CM

## 2015-08-13 DIAGNOSIS — M5412 Radiculopathy, cervical region: Secondary | ICD-10-CM | POA: Diagnosis not present

## 2015-08-13 DIAGNOSIS — M25511 Pain in right shoulder: Secondary | ICD-10-CM | POA: Diagnosis not present

## 2015-08-13 DIAGNOSIS — M6281 Muscle weakness (generalized): Secondary | ICD-10-CM

## 2015-08-13 NOTE — Therapy (Signed)
[facility] [address] [place], [place], [address] Phone: [phone]   Fax:  [phone]  Physical Therapy Treatment  Patient Details  Name: Victoria Hall MRN: [id number] Date of Birth: 06-Oct-1970 Referring Provider: Metta Clines, MD  Encounter Date: 08/13/2015      PT End of Session - 08/13/15 1214    Visit Number 3   Number of Visits 12   Date for PT Re-Evaluation 09/27/15   Authorization Type Private insurance, patient feels limited due to co-pay + coinsurance with plan   PT Start Time 0850   PT Stop Time 0935   PT Time Calculation (min) 45 min   Activity Tolerance Patient tolerated treatment well   Behavior During Therapy [facility] for tasks assessed/performed      Past Medical History  Diagnosis Date  . Palpitations   . Neurological abnormality     Neg workup with MRI/MRA in 2007 WNL except decreased caliber MCA proximally, distal cavernous portion of left LCA which may represent true stenosis or technique on exam. Evaluated by Dr Linus Salmons.  . Partial seizures ([place])     questionable diag  . Cervical intraepithelial neoplasia grade 3 2002    s/p LEEP Rx repeat pap negative  . S/P cervical discectomy     Dr Vertell Limber, anterior discectomy C5-C7  . Polycystic ovary     multiple ovarian cysts removed  . Fallopian tube disorder     right fallopian tube removed  . Hydrosalpinx     [facility]  . Seizures [facility])     Past Surgical History  Procedure Laterality Date  . Appendectomy    . Tonsillectomy    . Bunionectomy      bunion removal  . Spine surgery      There were no vitals filed for this visit.      Subjective Assessment - 08/13/15 0854    Subjective The patient had "zapping" pains down R upper back after PT last session.  She is dong HEP.  She notes increased pain in right elbow and forearm with PT.  She is wearing an elbow brace at night ot sleep.   Pertinent History PT s/p ACDF  aggravated symptoms.   Patient Stated Goals improve use of right arm, return to work, reduce pain.   Currently in Pain? Yes   Pain Score 2    Pain Location Neck   Pain Orientation Right   Pain Descriptors / Indicators Aching   Pain Type Chronic pain   Pain Onset More than a month ago   Pain Frequency Constant   Aggravating Factors  sitting still for longer periods   Pain Relieving Factors positioning, moving frequenty              [facility] Adult PT Treatment/Exercise - 08/13/15 0858    Neck Exercises: Supine   Neck Retraction 10 reps;3 secs   Capital Flexion 5 reps   Capital Flexion Limitations Increases pain in posterior aspect of neck.   Neck Exercises: Prone   Other Prone Exercise prone scapular retraction with arms at neutral and tactile cues for scapular depression/retraction; "W" exercise prone with tactile cues scpaular retraction and depression.   Other Prone Exercise Attempted prone rhomboid strengthening with shoulders abducted to 90 deg-too painful in left shoulder.  Able to tolerate "superman" overhead shoulder flexion with tactile cues scapular depression x 10 reps each side.   Shoulder Exercises: Supine   Protraction 10 reps;Right;AROM   Modalities   Modalities  Cryotherapy   Cryotherapy   Number Minutes Cryotherapy 4 Minutes   Cryotherapy Location Cervical   Type of Cryotherapy Ice massage  to right levator, upper rhomboids   Manual Therapy   Manual Therapy Soft tissue mobilization   Soft tissue mobilization leavator and scalene and upper trap soft tissue mobilization on right side x 10 minutes.                 PT Education - 08/13/15 [phone]    Education provided Yes   Education Details D/c'd neural glide at door frame from current HEp due to patient still being evaluated for cubital tunnel--she had increased discomfort after last therapy.   Person(s) Educated Patient   Methods Explanation;Demonstration;Handout   Comprehension Verbalized  understanding;Returned demonstration          PT Short Term Goals - 07/29/15 1006    PT SHORT TERM GOAL #1   Title The patient will be indep with HEP for cervical spine stabilization, flexibility, and postural strengthening.   Baseline Target date 08/28/2015   Time 4   Period Weeks   PT SHORT TERM GOAL #2   Title The patient will improve A/ROM R cervical rotaiton from 44 deg to 50 degrees to demo improving flexibility.   Baseline Target date 08/28/2015   Time 4   Period Weeks   PT SHORT TERM GOAL #3   Title The patient will perform neck flexion with pain not increasing from baseline level.   Baseline Target date 08/28/2015   Time 4   Period Weeks   PT SHORT TERM GOAL #4   Title The patient will tolerate right shoulder flexion >120 degrees without increasing pain from baseline level.   Baseline Target date 08/28/2015   Time 4   Period Weeks           PT Long Term Goals - 07/29/15 1010    PT LONG TERM GOAL #1   Title The patient will reduce neck disability index from 52% to < or equal to 40% to demo improved self perception of deficits.   Baseline Target date: 09/27/2015   Time 8   Period Weeks   PT LONG TERM GOAL #2   Title The patient will report being able to type for 20 minutes with correct body mechanics/positioning.   Baseline Target date: 09/27/2015   Time 8   Period Weeks   PT LONG TERM GOAL #3   Title The patient will report pain at worst level to be < or equal to 5/10 for reduction in severity of symptoms.   Baseline Target date: 09/27/2015   Time 8   Period Weeks   PT LONG TERM GOAL #4   Title The patient will improve R UE shoulder shrug to 4/5 (from 3/5 baseline).   Baseline Target date: 09/27/2015   Time 8   Period Weeks   PT LONG TERM GOAL #5   Title The patient will improve R shoulder abduction to > or equal to 4/5 (from 3/5 baseline).   Baseline Target date: 09/27/2015   Time 8   Period Weeks   Additional Long Term Goals   Additional Long Term Goals  Yes   PT LONG TERM GOAL #6   Title The patient will report return to work at reduced hours with self mgmt of pain/discomfort.   Baseline Target date: 09/27/2015   Time 8   Period Weeks               Plan - 08/13/15  1215    Clinical Impression Statement The patient continues with multi-factorial neck and UE pain including:  R elbow/ forearm pain that may be due to recent dx of cubital tunnel (undergoing MRI next week), R hand C5-C6 numbness from h/o radiculopathy, neck pain, upper back/thoracic/scpaular pain, and R UE weakness.  PT and patient discussed holding PT until further MRI of elbow next week.  She is to continue with HEP for postural stabilization.  f/u after elbow diagnostic testing.   PT Treatment/Interventions ADLs/Self Care Home Management;Cryotherapy;Moist Heat;Therapeutic exercise;Manual techniques;Therapeutic activities;Functional mobility training;Patient/family education;Neuromuscular re-education   PT Next Visit Plan Discuss elbow testing, postural stabilization, neck A/ROM, mid thoracic self mobilization   Consulted and Agree with Plan of Care Patient      Patient will benefit from skilled therapeutic intervention in order to improve the following deficits and impairments:  Decreased activity tolerance, Decreased mobility, Decreased strength, Postural dysfunction, Impaired sensation, Pain, Improper body mechanics, Decreased range of motion  Visit Diagnosis: Pain in right shoulder  Cervicalgia  Muscle weakness (generalized)  Radiculopathy, cervical region     Problem List Patient Active Problem List   Diagnosis Date Noted  . MUSCLE SPASM, BACK 06/08/2009  . HYDROSALPINX 02/07/2008  . HEADACHE 10/28/2007  . TACHYCARDIA 10/14/2007  . ANXIETY DISORDER 05/28/2007  . POLYARTHRITIS 05/09/2007  . UNSPECIFIED DISORDERS OF NERVOUS SYSTEM 04/22/2007  . ABDOMINAL PAIN RIGHT LOWER QUADRANT 04/08/2007  . PALPITATIONS, RECURRENT 11/07/2006  . PROBLEMS  W/SMELL/TASTE 06/29/2006  . TOBACCO ABUSE 01/08/2006  . VISUAL IMPAIRMENT 01/08/2006  . DISTURBANCE, VISUAL NOS 01/08/2006  . FIBROCYSTIC BREAST DISEASE 01/08/2006  . [place] DISEASE, CERVICAL 01/08/2006  . VERTIGO 01/08/2006    Sione Baumgarten, PT 08/13/2015, 12:18 PM  [facility] [address] [place], [place], [address] Phone: [phone]   Fax:  [phone]  Name: Victoria Hall MRN: [id number] Date of Birth: [birth year]

## 2015-08-16 DIAGNOSIS — M5412 Radiculopathy, cervical region: Secondary | ICD-10-CM | POA: Diagnosis not present

## 2015-08-16 DIAGNOSIS — M542 Cervicalgia: Secondary | ICD-10-CM | POA: Diagnosis not present

## 2015-08-16 DIAGNOSIS — M1288 Other specific arthropathies, not elsewhere classified, other specified site: Secondary | ICD-10-CM | POA: Diagnosis not present

## 2015-08-18 ENCOUNTER — Ambulatory Visit: Payer: BLUE CROSS/BLUE SHIELD | Admitting: Rehabilitative and Restorative Service Providers"

## 2015-08-20 ENCOUNTER — Ambulatory Visit: Payer: BLUE CROSS/BLUE SHIELD | Admitting: Rehabilitative and Restorative Service Providers"

## 2015-08-21 DIAGNOSIS — M25521 Pain in right elbow: Secondary | ICD-10-CM | POA: Diagnosis not present

## 2015-08-25 ENCOUNTER — Ambulatory Visit: Payer: BLUE CROSS/BLUE SHIELD | Admitting: Rehabilitative and Restorative Service Providers"

## 2015-08-27 ENCOUNTER — Ambulatory Visit: Payer: BLUE CROSS/BLUE SHIELD | Admitting: Rehabilitative and Restorative Service Providers"

## 2015-08-27 DIAGNOSIS — M542 Cervicalgia: Secondary | ICD-10-CM | POA: Diagnosis not present

## 2015-09-01 ENCOUNTER — Ambulatory Visit: Payer: BLUE CROSS/BLUE SHIELD | Admitting: Rehabilitative and Restorative Service Providers"

## 2015-09-01 DIAGNOSIS — M542 Cervicalgia: Secondary | ICD-10-CM | POA: Diagnosis not present

## 2015-09-01 DIAGNOSIS — M25511 Pain in right shoulder: Secondary | ICD-10-CM

## 2015-09-01 DIAGNOSIS — M6281 Muscle weakness (generalized): Secondary | ICD-10-CM | POA: Diagnosis not present

## 2015-09-01 DIAGNOSIS — M5412 Radiculopathy, cervical region: Secondary | ICD-10-CM | POA: Diagnosis not present

## 2015-09-01 NOTE — Therapy (Signed)
[facility] [address] [place], [place], [address] Phone: [phone]   Fax:  [phone]  Physical Therapy Treatment  Patient Details  Name: Victoria Hall MRN: [id number] Date of Birth: 11/26/1970 Referring Provider: Shon Millet, MD  Encounter Date: 09/01/2015      PT End of Session - 09/01/15 0953    Visit Number 4   Number of Visits 12   Date for PT Re-Evaluation 09/27/15   Authorization Type Private insurance, patient feels limited due to co-pay + coinsurance with plan   PT Start Time [phone]   PT Stop Time 0933   PT Time Calculation (min) 50 min   Activity Tolerance Patient tolerated treatment well   Behavior During Therapy [facility] for tasks assessed/performed      Past Medical History  Diagnosis Date  . Palpitations   . Neurological abnormality     Neg workup with MRI/MRA in 2007 WNL except decreased caliber MCA proximally, distal cavernous portion of left LCA which may represent true stenosis or technique on exam. Evaluated by Dr Luberta Robertson.  . Partial seizures ([facility])     questionable diag  . Cervical intraepithelial neoplasia grade 3 2002    s/p LEEP Rx repeat pap negative  . S/P cervical discectomy     Dr Venetia Maxon, anterior discectomy C5-C7  . Polycystic ovary     multiple ovarian cysts removed  . Fallopian tube disorder     right fallopian tube removed  . Hydrosalpinx     [facility]  . Seizures [facility])     Past Surgical History  Procedure Laterality Date  . Appendectomy    . Tonsillectomy    . Bunionectomy      bunion removal  . Spine surgery      There were no vitals filed for this visit.      Subjective Assessment - 09/01/15 0843    Subjective The patient returns to PT with information from ortho MD:  R elbow joint effusion, mild thickening of ulnar nerve consistent with neuritis (increased T2 signal).  Elbow pain is worse with activity.  She saw neurosurgeon at [facility] whom  recommended neurontin, which she is not comfortable taking.  She is not going to have surgery at this time.   Dr. Ricci Barker, MD @ [facility] recommended continuing HEP, myofascial release and evaluate for dry needling.    Pertinent History PT s/p ACDF aggravated symptoms.   Patient Stated Goals improve use of right arm, return to work, reduce pain.   Currently in Pain? Yes   Pain Score 2    Pain Location Neck   Pain Orientation Right   Pain Descriptors / Indicators Aching   Pain Type Chronic pain   Pain Radiating Towards Radiates into arm and anterior chest with activity (ex. mowed lawn and hurt down entire R arm)   Pain Onset More than a month ago   Pain Frequency Constant   Aggravating Factors  sitting still for longer periods, mowing, activity   Pain Relieving Factors positioning,, moving frequently      THERAPEUTIC EXERCISE: Prone position Scapular retraction with arms abducted at sides and elbows bent with tactile cues for right side (performed bilaterally) x 6 repetitions Scapular retraction with arms abducted to 90 deg and elbows extended x 5 reps with increased R suprascapular/T2 region pain and tactile cues for engaging scapular stabilizers Prone on elbows with press ups for self mobilization into thoracic extension (within tolerance for right elbow)  Supine  position: Towel roll perpendicular to T2-T3, then T1-T2 with reaching overhead for extension through thoracic spine for self mobilization Shoulder flexed to 90 with protraction/retraction reaching to ceiling and away from x 10 reps  Sitting: Shoulder rolls, shoulder shrugs, seated a/ROM R UE to 160 degrees flexion with pain near T2 transverse process and R suprascapular region Seated A/ROM neck rotation to R 58 deg, L 60 deg  Standing: Door frame stretch for right pectoralis musculature with R UE flexed at elbow and patient moving from Low>high with repetitions and remaining in tolerable ROM for stretch  MANUAL: Supine  positioning with soft tissue mobilization R scalenes working on contract/relax and shortening the muscle for trigger point release then P/ROM stretching of muscle, R upper trapezius soft tissue mobilization Sidelying parascapular soft tissue mobilization along rhomboids and levator and working on scalenes on R side while in sidelying (on L) position Prone levator and rhomboid soft tissue mobilization.  Patient had increased pain in T1-T2 region after soft tissue mob and exercise, therefore PT performed icemassage x 5 minutes at end of session.  Pain rated 4-5/10 at end of session.   Discussed potential benefits of dry needling due to significant muscle guarding/soft tissue tightness and recommended transfer to therapist that performs as intervention.  Patient agrees.            PT Education - 09/01/15 1209    Education provided Yes   Education Details Recommended continue current HEP, demonstrated anterior chest stretch in door frame at various heights.  Discussed transferring to another clinic in order to consider dry needling due to soft tissue tightness.   Person(s) Educated Patient   Methods Explanation   Comprehension Verbalized understanding          PT Short Term Goals - 09/01/15 0918    PT SHORT TERM GOAL #1   Title The patient will be indep with HEP for cervical spine stabilization, flexibility, and postural strengthening.   Baseline Target date 08/28/2015   Time 4   Period Weeks   Status Achieved   PT SHORT TERM GOAL #2   Title The patient will improve A/ROM R cervical rotaiton from 44 deg to 50 degrees to demo improving flexibility.   Baseline Met on 09/01/2015 with A/ROM R rotation 58 degrees.   Time 4   Period Weeks   Status Achieved   PT SHORT TERM GOAL #3   Title The patient will perform neck flexion with pain not increasing from baseline level.   Baseline Patient continues with increased pain at T1-T2 transverse process area.   Time 4   Period Weeks   Status  Not Met   PT SHORT TERM GOAL #4   Title The patient will tolerate right shoulder flexion >120 degrees without increasing pain from baseline level.   Baseline Target date 08/28/2015   Time 4   Period Weeks   Status Partially Met           PT Long Term Goals - 07/29/15 1010    PT LONG TERM GOAL #1   Title The patient will reduce neck disability index from 52% to < or equal to 40% to demo improved self perception of deficits.   Baseline Target date: 09/27/2015   Time 8   Period Weeks   PT LONG TERM GOAL #2   Title The patient will report being able to type for 20 minutes with correct body mechanics/positioning.   Baseline Target date: 09/27/2015   Time 8   Period Weeks  PT LONG TERM GOAL #3   Title The patient will report pain at worst level to be < or equal to 5/10 for reduction in severity of symptoms.   Baseline Target date: 09/27/2015   Time 8   Period Weeks   PT LONG TERM GOAL #4   Title The patient will improve R UE shoulder shrug to 4/5 (from 3/5 baseline).   Baseline Target date: 09/27/2015   Time 8   Period Weeks   PT LONG TERM GOAL #5   Title The patient will improve R shoulder abduction to > or equal to 4/5 (from 3/5 baseline).   Baseline Target date: 09/27/2015   Time 8   Period Weeks   Additional Long Term Goals   Additional Long Term Goals Yes   PT LONG TERM GOAL #6   Title The patient will report return to work at reduced hours with self mgmt of pain/discomfort.   Baseline Target date: 09/27/2015   Time 8   Period Weeks               Plan - 09/01/15 1212    Clinical Impression Statement The patient returns to PT after being on hold x 2 weeks for further work-up of R elbow pain (ruling out cubital tunnel) and further evaluation from neurosurgeon @ [facility].  At this time, per information from MD (referrals to be scanned) it is appropriate to continue with current plan of care adjusting to patient tolerance.  PT also to consider dry needling to provide  relief of soft tissue tightness.  Therefore, transferring to [facility] clinic to further treat.    PT Treatment/Interventions ADLs/Self Care Home Management;Cryotherapy;Moist Heat;Therapeutic exercise;Manual techniques;Therapeutic activities;Functional mobility training;Patient/family education;Neuromuscular re-education;Dry needling;Electrical Stimulation;Ultrasound   PT Next Visit Plan Evaluate for appropriateness of dry needling, postural stabilization, neck A/ROM, mid thoracic self mobilization.      Patient will benefit from skilled therapeutic intervention in order to improve the following deficits and impairments:  Decreased activity tolerance, Decreased mobility, Decreased strength, Postural dysfunction, Impaired sensation, Pain, Improper body mechanics, Decreased range of motion  Visit Diagnosis: Pain in right shoulder  Cervicalgia  Muscle weakness (generalized)     Problem List Patient Active Problem List   Diagnosis Date Noted  . MUSCLE SPASM, BACK 06/08/2009  . HYDROSALPINX 02/07/2008  . HEADACHE 10/28/2007  . TACHYCARDIA 10/14/2007  . ANXIETY DISORDER 05/28/2007  . POLYARTHRITIS 05/09/2007  . UNSPECIFIED DISORDERS OF NERVOUS SYSTEM 04/22/2007  . ABDOMINAL PAIN RIGHT LOWER QUADRANT 04/08/2007  . PALPITATIONS, RECURRENT 11/07/2006  . PROBLEMS W/SMELL/TASTE 06/29/2006  . TOBACCO ABUSE 01/08/2006  . VISUAL IMPAIRMENT 01/08/2006  . DISTURBANCE, VISUAL NOS 01/08/2006  . FIBROCYSTIC BREAST DISEASE 01/08/2006  . [place] DISEASE, CERVICAL 01/08/2006  . VERTIGO 01/08/2006    Zebulon Gantt, PT 09/01/2015, 12:15 PM  [facility] [address] [place], [place], [address] Phone: [phone]   Fax:  [phone]  Name: Victoria Hall MRN: [id number] Date of Birth: [birth year]

## 2015-09-02 ENCOUNTER — Ambulatory Visit: Payer: BLUE CROSS/BLUE SHIELD | Attending: Family Medicine

## 2015-09-02 DIAGNOSIS — M5412 Radiculopathy, cervical region: Secondary | ICD-10-CM | POA: Diagnosis not present

## 2015-09-02 DIAGNOSIS — M542 Cervicalgia: Secondary | ICD-10-CM | POA: Insufficient documentation

## 2015-09-02 DIAGNOSIS — M6281 Muscle weakness (generalized): Secondary | ICD-10-CM | POA: Diagnosis not present

## 2015-09-02 DIAGNOSIS — K8020 Calculus of gallbladder without cholecystitis without obstruction: Secondary | ICD-10-CM

## 2015-09-02 DIAGNOSIS — M25511 Pain in right shoulder: Secondary | ICD-10-CM | POA: Insufficient documentation

## 2015-09-02 HISTORY — DX: Calculus of gallbladder without cholecystitis without obstruction: K80.20

## 2015-09-02 NOTE — Patient Instructions (Addendum)
Trigger Point Dry Needling  . What is Trigger Point Dry Needling (DN)? o DN is a physical therapy technique used to treat muscle pain and dysfunction. Specifically, DN helps deactivate muscle trigger points (muscle knots).  o A thin filiform needle is used to penetrate the skin and stimulate the underlying trigger point. The goal is for a local twitch response (LTR) to occur and for the trigger point to relax. No medication of any kind is injected during the procedure.   . What Does Trigger Point Dry Needling Feel Like?  o The procedure feels different for each individual patient. Some patients report that they do not actually feel the needle enter the skin and overall the process is not painful. Very mild bleeding may occur. However, many patients feel a deep cramping in the muscle in which the needle was inserted. This is the local twitch response.   Marland Kitchen How Will I feel after the treatment? o Soreness is normal, and the onset of soreness may not occur for a few hours. Typically this soreness does not last longer than two days.  o Bruising is uncommon, however; ice can be used to decrease any possible bruising.  o In rare cases feeling tired or nauseous after the treatment is normal. In addition, your symptoms may get worse before they get better, this period will typically not last longer than 24 hours.   . What Can I do After My Treatment? o Increase your hydration by drinking more water for the next 24 hours. o You may place ice or heat on the areas treated that have become sore, however, do not use heat on inflamed or bruised areas. Heat often brings more relief post needling. o You can continue your regular activities, but vigorous activity is not recommended initially after the treatment for 24 hours. o DN is best combined with other physical therapy such as strengthening, stretching, and other therapies.  Angry Cat Stretch  Tuck chin and tighten stomach, arching back. Repeat __5-10__  times per set.  Do _1-2___ sessions per day. Child Pose   Sitting on knees, fold body over legs and relax head and arms on floor. Hold for _20___ breaths.  Do 3 reps.  1-2 times a day.  Copyright  VHI. All rights reserved.  Side Waist Stretch from Child's Pose  From child's pose, walk hands to left. Reach right hand out on diagonal. Reach hips back toward heels making a C with torso. Breathe into right side waist. Hold for _20___ breaths. Repeat _3___ times each side.  1-2 times a day.  Copyright  VHI. All rights reserved.     [facility] [address] [place], [place] [address] Phone # [phone] Fax [phone]

## 2015-09-02 NOTE — Therapy (Signed)
[facility] [address], [address] [place], [place], [address] Phone: [phone]   Fax:  [phone]  Physical Therapy Treatment  Patient Details  Name: Victoria Hall MRN: [id number] Date of Birth: 04/13/1970 Referring Provider: Shon Millet, MD  Encounter Date: 09/02/2015      PT End of Session - 09/02/15 1143    Visit Number 5   Date for PT Re-Evaluation 09/27/15   Authorization Type Private insurance, patient feels limited due to co-pay + coinsurance with plan   PT Start Time 1100   PT Stop Time 1153   PT Time Calculation (min) 53 min   Activity Tolerance Patient tolerated treatment well   Behavior During Therapy [facility] for tasks assessed/performed      Past Medical History  Diagnosis Date  . Palpitations   . Neurological abnormality     Neg workup with MRI/MRA in 2007 WNL except decreased caliber MCA proximally, distal cavernous portion of left LCA which may represent true stenosis or technique on exam. Evaluated by Dr Luberta Robertson.  . Partial seizures (HCC)     questionable diag  . Cervical intraepithelial neoplasia grade 3 2002    s/p LEEP Rx repeat pap negative  . S/P cervical discectomy     Dr Venetia Maxon, anterior discectomy C5-C7  . Polycystic ovary     multiple ovarian cysts removed  . Fallopian tube disorder     right fallopian tube removed  . Hydrosalpinx     [facility]  . Seizures [facility])     Past Surgical History  Procedure Laterality Date  . Appendectomy    . Tonsillectomy    . Bunionectomy      bunion removal  . Spine surgery      There were no vitals filed for this visit.      Subjective Assessment - 09/02/15 1050    Pertinent History PT s/p ACDF aggravated symptoms.   Patient Stated Goals improve use of right arm, return to work, reduce pain.   Currently in Pain? Yes   Pain Score 3    Pain Location Neck   Pain Orientation Right   Pain Descriptors / Indicators Aching   Pain  Type Chronic pain   Pain Onset More than a month ago   Pain Frequency Constant   Aggravating Factors  sitting still for long periods, mowing, activity   Pain Relieving Factors positioning, mowing, activity                         [facility] Adult PT Treatment/Exercise - 09/02/15 0001    Exercises   Exercises Lumbar   Lumbar Exercises: Quadruped   [place]/Old Horse 10 reps   Other Quadruped Lumbar Exercises Childs pose and lateral stretch 3x20 each   Modalities   Modalities Moist Heat   Moist Heat Therapy   Number Minutes Moist Heat 10 Minutes   Moist Heat Location Cervical  thoracic   Manual Therapy   Manual Therapy Soft tissue mobilization   Soft tissue mobilization leavator and scalene and upper trap soft tissue mobilization on right side x 10 minutes.           Trigger Point Dry Needling - 09/02/15 1049    Consent Given? Yes   Education Handout Provided Yes  pneumothorax precautions   Muscles Treated Upper Body Upper trapezius;Rhomboids;Subscapularis;Levator scapulae  cervical parapsinals, all Rt>Lt    Upper Trapezius Response Twitch reponse elicited;Palpable increased muscle length   Levator  Scapulae Response Twitch response elicited;Palpable increased muscle length   Rhomboids Response Twitch response elicited;Palpable increased muscle length   Subscapularis Response Twitch response elicited;Palpable increased muscle length              PT Education - 09/02/15 1040    Education provided Yes   Education Details DN info, quadruped flexiblity   Person(s) Educated Patient   Methods Handout;Explanation   Comprehension Verbalized understanding          PT Short Term Goals - 09/01/15 6776    PT SHORT TERM GOAL #1   Title The patient will be indep with HEP for cervical spine stabilization, flexibility, and postural strengthening.   Baseline Target date 08/28/2015   Time 4   Period Weeks   Status Achieved   PT SHORT TERM GOAL #2   Title The  patient will improve A/ROM R cervical rotaiton from 44 deg to 50 degrees to demo improving flexibility.   Baseline Met on 09/01/2015 with A/ROM R rotation 58 degrees.   Time 4   Period Weeks   Status Achieved   PT SHORT TERM GOAL #3   Title The patient will perform neck flexion with pain not increasing from baseline level.   Baseline Patient continues with increased pain at T1-T2 transverse process area.   Time 4   Period Weeks   Status Not Met   PT SHORT TERM GOAL #4   Title The patient will tolerate right shoulder flexion >120 degrees without increasing pain from baseline level.   Baseline Target date 08/28/2015   Time 4   Period Weeks   Status Partially Met           PT Long Term Goals - 07/29/15 1010    PT LONG TERM GOAL #1   Title The patient will reduce neck disability index from 52% to < or equal to 40% to demo improved self perception of deficits.   Baseline Target date: 09/27/2015   Time 8   Period Weeks   PT LONG TERM GOAL #2   Title The patient will report being able to type for 20 minutes with correct body mechanics/positioning.   Baseline Target date: 09/27/2015   Time 8   Period Weeks   PT LONG TERM GOAL #3   Title The patient will report pain at worst level to be < or equal to 5/10 for reduction in severity of symptoms.   Baseline Target date: 09/27/2015   Time 8   Period Weeks   PT LONG TERM GOAL #4   Title The patient will improve R UE shoulder shrug to 4/5 (from 3/5 baseline).   Baseline Target date: 09/27/2015   Time 8   Period Weeks   PT LONG TERM GOAL #5   Title The patient will improve R shoulder abduction to > or equal to 4/5 (from 3/5 baseline).   Baseline Target date: 09/27/2015   Time 8   Period Weeks   Additional Long Term Goals   Additional Long Term Goals Yes   PT LONG TERM GOAL #6   Title The patient will report return to work at reduced hours with self mgmt of pain/discomfort.   Baseline Target date: 09/27/2015   Time 8   Period Weeks                Plan - 09/02/15 1141    Clinical Impression Statement Pt with active trigger points in bil. UT and Rt rhomboids.  Pt with increaesd tissue length and  mobility s/p dry needling.  Pt with difficulty with quadruped flexiblity and and guarded with movement.  Pt will benefit from postural strength, manual, thoracic mobs, and flexiblity with modalities as needed.     Rehab Potential Fair   PT Frequency 2x / week   PT Duration 8 weeks   PT Treatment/Interventions ADLs/Self Care Home Management;Cryotherapy;Moist Heat;Therapeutic exercise;Manual techniques;Therapeutic activities;Functional mobility training;Patient/family education;Neuromuscular re-education;Dry needling;Electrical Stimulation;Ultrasound   PT Next Visit Plan assess response to DN, strength, flexiblity, thoracic mobs   Consulted and Agree with Plan of Care Patient      Patient will benefit from skilled therapeutic intervention in order to improve the following deficits and impairments:  Decreased activity tolerance, Decreased mobility, Decreased strength, Postural dysfunction, Impaired sensation, Pain, Improper body mechanics, Decreased range of motion  Visit Diagnosis: Pain in right shoulder  Cervicalgia  Radiculopathy, cervical region  Muscle weakness (generalized)     Problem List Patient Active Problem List   Diagnosis Date Noted  . MUSCLE SPASM, BACK 06/08/2009  . HYDROSALPINX 02/07/2008  . HEADACHE 10/28/2007  . TACHYCARDIA 10/14/2007  . ANXIETY DISORDER 05/28/2007  . POLYARTHRITIS 05/09/2007  . UNSPECIFIED DISORDERS OF NERVOUS SYSTEM 04/22/2007  . ABDOMINAL PAIN RIGHT LOWER QUADRANT 04/08/2007  . PALPITATIONS, RECURRENT 11/07/2006  . PROBLEMS W/SMELL/TASTE 06/29/2006  . TOBACCO ABUSE 01/08/2006  . VISUAL IMPAIRMENT 01/08/2006  . DISTURBANCE, VISUAL NOS 01/08/2006  . FIBROCYSTIC BREAST DISEASE 01/08/2006  . [place] DISEASE, CERVICAL 01/08/2006  . VERTIGO 01/08/2006     Sigurd Sos,  PT 09/02/2015 11:44 AM  [facility] [address], [place], [place], [address] Phone: [phone]   Fax:  [phone]  Name: Victoria Hall MRN: [id number] Date of Birth: [birth year]

## 2015-09-03 ENCOUNTER — Ambulatory Visit: Payer: BLUE CROSS/BLUE SHIELD | Admitting: Rehabilitative and Restorative Service Providers"

## 2015-09-13 ENCOUNTER — Ambulatory Visit (INDEPENDENT_AMBULATORY_CARE_PROVIDER_SITE_OTHER): Payer: BLUE CROSS/BLUE SHIELD | Admitting: Family Medicine

## 2015-09-13 ENCOUNTER — Encounter: Payer: Self-pay | Admitting: Family Medicine

## 2015-09-13 VITALS — BP 116/80 | HR 98 | Temp 98.1°F | Ht 66.0 in | Wt 172.2 lb

## 2015-09-13 DIAGNOSIS — K591 Functional diarrhea: Secondary | ICD-10-CM | POA: Diagnosis not present

## 2015-09-13 DIAGNOSIS — R1013 Epigastric pain: Secondary | ICD-10-CM | POA: Diagnosis not present

## 2015-09-13 DIAGNOSIS — K8020 Calculus of gallbladder without cholecystitis without obstruction: Secondary | ICD-10-CM

## 2015-09-13 MED ORDER — SUCRALFATE 1 G PO TABS: 1.0000 g | ORAL_TABLET | Freq: Three times a day (TID) | ORAL | Status: DC

## 2015-09-13 MED ORDER — DICYCLOMINE HCL 20 MG PO TABS: 20.0000 mg | ORAL_TABLET | Freq: Four times a day (QID) | ORAL | Status: DC

## 2015-09-13 NOTE — Progress Notes (Addendum)
[place] [facility] [address], [place], [place] [address] [phone]  Date:  09/13/2015   Name:  Victoria Hall   DOB:  1970/05/12   MRN:  [id number]  PCP:  Lamar Blinks, MD    Chief Complaint: Abdominal Pain   History of Present Illness:  Victoria Hall is a 45 y.o. very pleasant female patient who presents with the following:  She is here today with complaint of abdominal pain that she has noted "off an on for over 10 years." She had thought that it might be some sort of food intolerance and has tried to cut out bothersome foods especially raw onions and garlic.  This seemed to help but then the sx got worse again over the last few weeks.   She will have the pain mostly 90 minutes after eating. She never has any pain with cereal and milk but almost any other food can cause the pain.  The pain can last for 40min to 3 hours.  Leaning forward seems to help, and pressing on her stomach helps The day following a pain episode she will often have diarrhea She has had the pain most days over the last 2 weeks She will seem to alternate between diarrhea and constipation Denies any chance of current pregnancy   Wt Readings from Last 3 Encounters:  09/13/15 172 lb 3.2 oz (78.109 kg)  06/21/15 177 lb (80.287 kg)  05/27/15 172 lb 6.4 oz (78.2 kg)   No vomiting.   She has not noted any fever  She has also felt achy and tired, "i'm exhausted when I wake up in the morning."   She is not using any OTC medications for her stomach.  She did try prilosec and zantac in the past but they caused diarrhea  She has not had a sensitive stomach generally over her lifetime   Patient Active Problem List   Diagnosis Date Noted  . MUSCLE SPASM, BACK 06/08/2009  . HYDROSALPINX 02/07/2008  . HEADACHE 10/28/2007  . TACHYCARDIA 10/14/2007  . ANXIETY DISORDER 05/28/2007  . POLYARTHRITIS 05/09/2007  . UNSPECIFIED DISORDERS OF NERVOUS  SYSTEM 04/22/2007  . ABDOMINAL PAIN RIGHT LOWER QUADRANT 04/08/2007  . PALPITATIONS, RECURRENT 11/07/2006  . PROBLEMS W/SMELL/TASTE 06/29/2006  . TOBACCO ABUSE 01/08/2006  . VISUAL IMPAIRMENT 01/08/2006  . DISTURBANCE, VISUAL NOS 01/08/2006  . FIBROCYSTIC BREAST DISEASE 01/08/2006  . [place] DISEASE, CERVICAL 01/08/2006  . VERTIGO 01/08/2006    Past Medical History  Diagnosis Date  . Palpitations   . Neurological abnormality     Neg workup with MRI/MRA in 2007 WNL except decreased caliber MCA proximally, distal cavernous portion of left LCA which may represent true stenosis or technique on exam. Evaluated by Dr Linus Salmons.  . Partial seizures ([place])     questionable diag  . Cervical intraepithelial neoplasia grade 3 2002    s/p LEEP Rx repeat pap negative  . S/P cervical discectomy     Dr Vertell Limber, anterior discectomy C5-C7  . Polycystic ovary     multiple ovarian cysts removed  . Fallopian tube disorder     right fallopian tube removed  . Hydrosalpinx     [facility]  . Seizures [facility])     Past Surgical History  Procedure Laterality Date  . Appendectomy    . Tonsillectomy    . Bunionectomy      bunion removal  . Spine surgery      Social  History  Substance Use Topics  . Smoking status: Current Every Day Smoker -- 0.50 packs/day for 10 years    Types: Cigarettes  . Smokeless tobacco: None  . Alcohol Use: 0.6 - 1.2 oz/week    1-2 Standard drinks or equivalent per week     Comment: occasionally    Family History  Problem Relation Age of Onset  . Adopted: Yes    Allergies  Allergen Reactions  . Alprazolam     Bruising  . Codeine Nausea And Vomiting    Medication list has been reviewed and updated.  No current outpatient prescriptions on file prior to visit.   No current facility-administered medications on file prior to visit.    Review of Systems:  As per HPI- otherwise negative.   Physical Examination: Filed Vitals:   09/13/15 1450   BP: 116/80  Pulse: 98  Temp: 98.1 F (36.7 C)   Filed Vitals:   09/13/15 1450  Height: 5\' 6"  (1.676 m)  Weight: 172 lb 3.2 oz (78.109 kg)   Body mass index is 27.81 kg/(m^2). Ideal Body Weight: Weight in (lb) to have BMI = 25: 154.6  GEN: WDWN, NAD, Non-toxic, A & O x 3, mild overweight, looks well HEENT: Atraumatic, Normocephalic. Neck supple. No masses, No LAD.  Bilateral TM wnl, oropharynx normal.  PEERL,EOMI.   Ears and Nose: No external deformity. CV: RRR, No M/G/R. No JVD. No thrill. No extra heart sounds. PULM: CTA B, no wheezes, crackles, rhonchi. No retractions. No resp. distress. No accessory muscle use. ABD: S, ND, +BS. No rebound. No HSM.  Mild epigastric TTP EXTR: No c/c/e NEURO Normal gait.  PSYCH: Normally interactive. Conversant. Not depressed or anxious appearing.  Calm demeanor.    Assessment and Plan: Postprandial epigastric pain - Plan: sucralfate (CARAFATE) 1 g tablet, CBC with Differential/Platelet, Comprehensive metabolic panel, Lipase, H. pylori breath test  Functional diarrhea - Plan: dicyclomine (BENTYL) 20 MG tablet  Here today with post- prandial pain for 2-3 weeks.  Will check BW and breath test as above.  If negative will pursue a RUQ [place].  carafate and bentyl for sx in the meantime.  No sign of an acute illness at this time but she will seek care if any changes or worsening   Signed Lamar Blinks, MD  Received her labs on 6/13- all ok except for elevated WBC count.  Will try to get [place] today if at all possible to eval gallbladder.  Called pt but she did not answer, LMOM.  Ordered US for her and will ask staff to keep trying to contact her  US done today- called her with results around 8pm  US Abdomen Limited Ruq  09/14/2015  CLINICAL DATA:  Epigastric pain radiating to lower abdomen, intermittent for 10 years. Recent episode for 2 weeks. EXAM: US ABDOMEN LIMITED - RIGHT UPPER QUADRANT COMPARISON:  None. FINDINGS: Gallbladder: Multiple layering  stones within the nondistended gallbladder. No gallbladder wall thickening, pericholecystic fluid or other secondary signs of acute cholecystitis. Common bile duct: Diameter: Normal at 2 mm. Liver: No focal lesion identified. Within normal limits in parenchymal echogenicity. IMPRESSION: 1. Cholelithiasis without evidence of acute cholecystitis. 2. No acute findings. Electronically Signed   By: Franki Cabot M.D.   On: 09/14/2015 19:37   She does have gallstones- this is the likely cause of her pain.  Will refer to general surgery to discuss cholecystectomy.  Her pain has been stable over the last 2-3 weeks.  Advised her to seek care  if any worsening such as fever, vomiting or inability to eat.

## 2015-09-13 NOTE — Patient Instructions (Signed)
We will do bloodwork and the h pylori breath test for you today While we await results, try the carafate with meals and at bedtime for about 10 days You can also use the bentyl as needed for stomach cramping/ diarrhea I'll be in touch with your results asap- let me know if anything is changing or getting worse

## 2015-09-14 ENCOUNTER — Ambulatory Visit: Payer: BLUE CROSS/BLUE SHIELD

## 2015-09-14 ENCOUNTER — Ambulatory Visit (HOSPITAL_BASED_OUTPATIENT_CLINIC_OR_DEPARTMENT_OTHER)
Admission: RE | Admit: 2015-09-14 | Discharge: 2015-09-14 | Disposition: A | Discharge status: Home / Self Care | Payer: BLUE CROSS/BLUE SHIELD | Source: Ambulatory Visit | Attending: Family Medicine | Admitting: Family Medicine

## 2015-09-14 DIAGNOSIS — M6281 Muscle weakness (generalized): Secondary | ICD-10-CM

## 2015-09-14 DIAGNOSIS — M5412 Radiculopathy, cervical region: Secondary | ICD-10-CM | POA: Diagnosis not present

## 2015-09-14 DIAGNOSIS — M25511 Pain in right shoulder: Secondary | ICD-10-CM

## 2015-09-14 DIAGNOSIS — R1013 Epigastric pain: Secondary | ICD-10-CM | POA: Diagnosis not present

## 2015-09-14 DIAGNOSIS — M542 Cervicalgia: Secondary | ICD-10-CM

## 2015-09-14 DIAGNOSIS — K8020 Calculus of gallbladder without cholecystitis without obstruction: Secondary | ICD-10-CM | POA: Insufficient documentation

## 2015-09-14 LAB — COMPREHENSIVE METABOLIC PANEL
ALT: 21 U/L (ref 0–35)
AST: 18 U/L (ref 0–37)
Albumin: 4.1 g/dL (ref 3.5–5.2)
Alkaline Phosphatase: 49 U/L (ref 39–117)
BUN: 8 mg/dL (ref 6–23)
CO2: 27 mEq/L (ref 19–32)
Calcium: 9.3 mg/dL (ref 8.4–10.5)
Chloride: 104 mEq/L (ref 96–112)
Creatinine, Ser: 0.76 mg/dL (ref 0.40–1.20)
GFR: 87.15 mL/min (ref >60.00)
Glucose, Bld: 84 mg/dL (ref 70–99)
Potassium: 4.1 mEq/L (ref 3.5–5.1)
Sodium: 137 mEq/L (ref 135–145)
Total Bilirubin: 0.4 mg/dL (ref 0.2–1.2)
Total Protein: 6.9 g/dL (ref 6.0–8.3)

## 2015-09-14 LAB — CBC WITH DIFFERENTIAL/PLATELET
Basophils Absolute: 0.1 10*3/uL (ref 0.0–0.1)
Basophils Relative: 0.8 % (ref 0.0–3.0)
Eosinophils Absolute: 0.5 10*3/uL (ref 0.0–0.7)
Eosinophils Relative: 3.7 % (ref 0.0–5.0)
HCT: 41.3 % (ref 36.0–46.0)
Hemoglobin: 13.8 g/dL (ref 12.0–15.0)
Lymphocytes Relative: 21.4 % (ref 12.0–46.0)
Lymphs Abs: 3.2 10*3/uL (ref 0.7–4.0)
MCHC: 33.5 g/dL (ref 30.0–36.0)
MCV: 84.9 fl (ref 78.0–100.0)
Monocytes Absolute: 0.6 10*3/uL (ref 0.1–1.0)
Monocytes Relative: 4 % (ref 3.0–12.0)
Neutro Abs: 10.4 10*3/uL — ABNORMAL HIGH (ref 1.4–7.7)
Neutrophils Relative %: 70.1 % (ref 43.0–77.0)
Platelets: 323 10*3/uL (ref 150.0–400.0)
RBC: 4.87 Mil/uL (ref 3.87–5.11)
RDW: 13.8 % (ref 11.5–15.5)
WBC: 14.8 10*3/uL — ABNORMAL HIGH (ref 4.0–10.5)

## 2015-09-14 LAB — H. PYLORI BREATH TEST: H. pylori Breath Test: NOT DETECTED

## 2015-09-14 LAB — LIPASE: Lipase: 19 U/L (ref 11.0–59.0)

## 2015-09-14 NOTE — Addendum Note (Signed)
Addended by: Lamar Blinks C on: 09/14/2015 08:04 PM   Modules accepted: Orders

## 2015-09-14 NOTE — Therapy (Addendum)
[facility] [address], [place], [place], [address] Phone: [phone]   Fax:  [phone]  Physical Therapy Treatment  Patient Details  Name: Victoria Hall MRN: [id number] Date of Birth: [birth year] Referring Provider: Metta Clines, MD  Encounter Date: 09/14/2015      PT End of Session - 09/14/15 [id number]    Visit Number 6   Date for PT Re-Evaluation 09/27/15   Authorization Type Private insurance, patient feels limited due to co-pay + coinsurance with plan   PT Start Time 1225   PT Stop Time 1313   PT Time Calculation (min) 48 min   Activity Tolerance Patient limited by pain   Behavior During Therapy [facility] for tasks assessed/performed      Past Medical History  Diagnosis Date  . Palpitations   . Neurological abnormality     Neg workup with MRI/MRA in 2007 WNL except decreased caliber MCA proximally, distal cavernous portion of left LCA which may represent true stenosis or technique on exam. Evaluated by Dr Linus Salmons.  . Partial seizures ([place])     questionable diag  . Cervical intraepithelial neoplasia grade 3 2002    s/p LEEP Rx repeat pap negative  . S/P cervical discectomy     Dr Vertell Limber, anterior discectomy C5-C7  . Polycystic ovary     multiple ovarian cysts removed  . Fallopian tube disorder     right fallopian tube removed  . Hydrosalpinx     [facility]  . Seizures [facility])     Past Surgical History  Procedure Laterality Date  . Appendectomy    . Tonsillectomy    . Bunionectomy      bunion removal  . Spine surgery      There were no vitals filed for this visit.      Subjective Assessment - 09/14/15 1226    Subjective Pt reports that she had flare-up of Rt UE after needling last session.     Pertinent History PT s/p ACDF aggravated symptoms.   Patient Stated Goals improve use of right arm, return to work, reduce pain.   Currently in Pain? Yes   Pain Score 2    Pain Location  Neck   Pain Orientation Right   Pain Descriptors / Indicators Aching   Pain Type Chronic pain   Pain Onset More than a month ago   Pain Frequency Constant   Aggravating Factors  lifting, pulling, walking dog, laundry, dishes   Pain Relieving Factors rest, ice                         [facility] Adult PT Treatment/Exercise - 09/14/15 0001    Cryotherapy   Number Minutes Cryotherapy 10 Minutes   Cryotherapy Location Cervical  thoracic   Manual Therapy   Manual Therapy Soft tissue mobilization;Joint mobilization   Joint Mobilization PA mobs T2-6 grade 2   Soft tissue mobilization leavator and scalene and upper trap soft tissue mobilization on right side           Trigger Point Dry Needling - 09/14/15 1303    Consent Given? Yes   Muscles Treated Upper Body Upper trapezius;Subscapularis;Rhomboids  cervical paraspinals and multifidi Rt>Lt   Upper Trapezius Response Twitch reponse elicited;Palpable increased muscle length   Levator Scapulae Response Twitch response elicited;Palpable increased muscle length   Rhomboids Response Twitch response elicited;Palpable increased muscle length   Subscapularis Response Twitch response elicited;Palpable increased muscle length  PT Short Term Goals - 09/14/15 1229    PT SHORT TERM GOAL #3   Title The patient will perform neck flexion with pain not increasing from baseline level.   Time 4   Period Weeks   Status On-going           PT Long Term Goals - 09/14/15 1230    PT LONG TERM GOAL #1   Title The patient will reduce neck disability index from 52% to < or equal to 40% to demo improved self perception of deficits.   Time 8   Period Weeks   Status On-going   PT LONG TERM GOAL #2   Title The patient will report being able to type for 20 minutes with correct body mechanics/positioning.   Time 8   Period Weeks   Status On-going               Plan - 09/14/15 1231    Clinical Impression  Statement Pt with lapse in treatment x 12 days.  Pt reported that she had increased radiculopathy s/p needling last session and would like to try it again.  Pt with active trigger points in bil. UT and Rt rhomboids and demonstrated increased tissue length s/p dry needling. Pt is very sensitive to all manual therapy. Pt with difficulty with flexiblity exercises due to Rt elbow pain.  Pt will benefit from postural strength, manual, thoracic mobs andflexibility wiht modalities as needed.     Rehab Potential Fair   PT Frequency 2x / week   PT Duration 8 weeks   PT Treatment/Interventions ADLs/Self Care Home Management;Cryotherapy;Moist Heat;Therapeutic exercise;Manual techniques;Therapeutic activities;Functional mobility training;Patient/family education;Neuromuscular re-education;Dry needling;Electrical Stimulation;Ultrasound   PT Next Visit Plan assess response to DN, strength, flexiblity, thoracic mobs.     Consulted and Agree with Plan of Care Patient      Patient will benefit from skilled therapeutic intervention in order to improve the following deficits and impairments:  Decreased activity tolerance, Decreased mobility, Decreased strength, Postural dysfunction, Impaired sensation, Pain, Improper body mechanics, Decreased range of motion  Visit Diagnosis: Pain in right shoulder  Cervicalgia  Muscle weakness (generalized)  Radiculopathy, cervical region     Problem List Patient Active Problem List   Diagnosis Date Noted  . MUSCLE SPASM, BACK 06/08/2009  . HYDROSALPINX 02/07/2008  . HEADACHE 10/28/2007  . TACHYCARDIA 10/14/2007  . ANXIETY DISORDER 05/28/2007  . POLYARTHRITIS 05/09/2007  . UNSPECIFIED DISORDERS OF NERVOUS SYSTEM 04/22/2007  . ABDOMINAL PAIN RIGHT LOWER QUADRANT 04/08/2007  . PALPITATIONS, RECURRENT 11/07/2006  . PROBLEMS W/SMELL/TASTE 06/29/2006  . TOBACCO ABUSE 01/08/2006  . VISUAL IMPAIRMENT 01/08/2006  . DISTURBANCE, VISUAL NOS 01/08/2006  . FIBROCYSTIC  BREAST DISEASE 01/08/2006  . [place] DISEASE, CERVICAL 01/08/2006  . VERTIGO 01/08/2006     Sigurd Sos, PT 09/14/2015 1:18 PM  [facility] [address], [place], [place], [address] Phone: [phone]   Fax:  [phone]  Name: Victoria Hall MRN: [id number] Date of Birth: [birth year]

## 2015-09-14 NOTE — Addendum Note (Signed)
Addended by: Lamar Blinks C on: 09/14/2015 12:39 PM   Modules accepted: Orders

## 2015-09-15 ENCOUNTER — Encounter: Payer: Self-pay | Admitting: Family Medicine

## 2015-09-15 NOTE — Progress Notes (Signed)
Documentation for 09/14/14. Per Dr. Lorelei Pont appointment scheduled for [place]. Patient called with time and instructions. Advised patient she should not eat or drink anything prior to her 6:30 appointment with imaging.

## 2015-09-17 ENCOUNTER — Ambulatory Visit: Payer: BLUE CROSS/BLUE SHIELD | Admitting: Physical Therapy

## 2015-09-17 DIAGNOSIS — M5412 Radiculopathy, cervical region: Secondary | ICD-10-CM

## 2015-09-17 DIAGNOSIS — M25511 Pain in right shoulder: Secondary | ICD-10-CM

## 2015-09-17 DIAGNOSIS — M542 Cervicalgia: Secondary | ICD-10-CM | POA: Diagnosis not present

## 2015-09-17 DIAGNOSIS — M6281 Muscle weakness (generalized): Secondary | ICD-10-CM | POA: Diagnosis not present

## 2015-09-17 NOTE — Therapy (Signed)
[facility] [address] [place], [place], [address] Phone: [phone]   Fax:  [phone]  Physical Therapy Treatment  Patient Details  Name: Victoria Hall MRN: [id number] Date of Birth: 14-Jun-1970 Referring Provider: Metta Clines, MD  Encounter Date: 09/17/2015      PT End of Session - 09/17/15 1139    Visit Number 7   Number of Visits 12   Date for PT Re-Evaluation 09/27/15   Authorization Type Private insurance, patient feels limited due to co-pay + coinsurance with plan   PT Start Time 1052   PT Stop Time 1145   PT Time Calculation (min) 53 min   Activity Tolerance Patient tolerated treatment well      Past Medical History  Diagnosis Date  . Palpitations   . Neurological abnormality     Neg workup with MRI/MRA in 2007 WNL except decreased caliber MCA proximally, distal cavernous portion of left LCA which may represent true stenosis or technique on exam. Evaluated by Dr Linus Salmons.  . Partial seizures ([place])     questionable diag  . Cervical intraepithelial neoplasia grade 3 2002    s/p LEEP Rx repeat pap negative  . S/P cervical discectomy     Dr Vertell Limber, anterior discectomy C5-C7  . Polycystic ovary     multiple ovarian cysts removed  . Fallopian tube disorder     right fallopian tube removed  . Hydrosalpinx     [facility]  . Seizures [facility])     Past Surgical History  Procedure Laterality Date  . Appendectomy    . Tonsillectomy    . Bunionectomy      bunion removal  . Spine surgery      There were no vitals filed for this visit.      Subjective Assessment - 09/17/15 1053    Subjective Going to have gallbladder surgery 09/23/15.   Neck and UE pain is there daily.  ROM in neck varies, today is better than yesterday.  I was not as sore this past time after needling.  I think ice instead of heat was better.     Currently in Pain? Yes   Pain Score 2    Pain Location Neck   Pain  Orientation Right   Pain Radiating Towards pain with tingling in forearm, pain in upper arm                         [facility] Adult PT Treatment/Exercise - 09/17/15 0001    Neck Exercises: Seated   Cervical Isometrics Flexion;Extension;Right lateral flexion;Left lateral flexion;Right rotation;Left rotation;5 secs  for HEP   Shoulder Exercises: Standing   Protraction 5 reps;Both  wall push ups as modification of angry cat   Shoulder Exercises: Isometric Strengthening   Flexion 5X5"   Cryotherapy   Number Minutes Cryotherapy 10 Minutes   Cryotherapy Location Cervical   Manual Therapy   Manual Therapy Myofascial release;Muscle Energy Technique   Soft tissue mobilization right uppper trap, levator, cervical multifidi; suboccipitals   Myofascial Release suboccipital release 3 min   Muscle Energy Technique right upper trap contract/relax 5x 5 sec hold          Trigger Point Dry Needling - 09/17/15 1135    Consent Given? Yes   Muscles Treated Upper Body Upper trapezius;Suboccipitals muscle group;Levator scapulae  bilateral cervical multifidi   Upper Trapezius Response Twitch reponse elicited;Palpable increased muscle length   SubOccipitals Response Palpable  increased muscle length   Levator Scapulae Response Twitch response elicited;Palpable increased muscle length              PT Education - 09/17/15 1139    Education provided Yes   Education Details cervical and shoulder isometrics   Person(s) Educated Patient   Methods Explanation;Demonstration;Handout   Comprehension Verbalized understanding;Returned demonstration          PT Short Term Goals - 09/17/15 1155    PT SHORT TERM GOAL #1   Title The patient will be indep with HEP for cervical spine stabilization, flexibility, and postural strengthening.   Status Achieved   PT SHORT TERM GOAL #2   Title The patient will improve A/ROM R cervical rotaiton from 44 deg to 50 degrees to demo improving  flexibility.   Status Achieved   PT SHORT TERM GOAL #3   Title The patient will perform neck flexion with pain not increasing from baseline level.   Time 4   Period Weeks   Status On-going   PT SHORT TERM GOAL #4   Title The patient will tolerate right shoulder flexion >120 degrees without increasing pain from baseline level.   Time 4   Period Weeks   Status Partially Met           PT Long Term Goals - 09/17/15 1155    PT LONG TERM GOAL #1   Title The patient will reduce neck disability index from 52% to < or equal to 40% to demo improved self perception of deficits.   Time 8   Period Weeks   Status On-going   PT LONG TERM GOAL #2   Title The patient will report being able to type for 20 minutes with correct body mechanics/positioning.   Time 8   Period Weeks   Status On-going   PT LONG TERM GOAL #3   Title The patient will report pain at worst level to be < or equal to 5/10 for reduction in severity of symptoms.   Time 8   Period Weeks   Status On-going   PT LONG TERM GOAL #4   Title The patient will improve R UE shoulder shrug to 4/5 (from 3/5 baseline).   Time 8   Period Weeks   Status On-going   PT LONG TERM GOAL #5   Title The patient will improve R shoulder abduction to > or equal to 4/5 (from 3/5 baseline).   Time 8   Period Weeks   Status On-going   PT LONG TERM GOAL #6   Title The patient will report return to work at reduced hours with self mgmt of pain/discomfort.   Time 8   Period Weeks   Status On-going               Plan - 09/17/15 1140    Clinical Impression Statement The patient has an upcoming gallbladder surgery.  The patient continues to be very sensitive with ROM and strengthening of neck and UEs.  Added cervical and shoulder isometrics with submax effort for strengthening and stabilization.  Numerous tender points in suboccipitals, cervical multifidi, upper traps and levator scap muscles.    Improved muscle length following treatment  interventions.  Good pain relief with cold pack.      PT Next Visit Plan assess response to DN #2, strength, flexiblity, thoracic mobs as tolerated;  cold pack for pain control      Patient will benefit from skilled therapeutic intervention in order to improve the following  deficits and impairments:     Visit Diagnosis: Pain in right shoulder  Cervicalgia  Muscle weakness (generalized)  Radiculopathy, cervical region     Problem List Patient Active Problem List   Diagnosis Date Noted  . MUSCLE SPASM, BACK 06/08/2009  . HYDROSALPINX 02/07/2008  . HEADACHE 10/28/2007  . TACHYCARDIA 10/14/2007  . ANXIETY DISORDER 05/28/2007  . POLYARTHRITIS 05/09/2007  . UNSPECIFIED DISORDERS OF NERVOUS SYSTEM 04/22/2007  . ABDOMINAL PAIN RIGHT LOWER QUADRANT 04/08/2007  . PALPITATIONS, RECURRENT 11/07/2006  . PROBLEMS W/SMELL/TASTE 06/29/2006  . TOBACCO ABUSE 01/08/2006  . VISUAL IMPAIRMENT 01/08/2006  . DISTURBANCE, VISUAL NOS 01/08/2006  . FIBROCYSTIC BREAST DISEASE 01/08/2006  . [place] DISEASE, CERVICAL 01/08/2006  . VERTIGO 01/08/2006     Ruben Im, PT 09/17/2015 11:58 AM Phone: [phone] Fax: [phone]   Alvera Singh 09/17/2015, 11:57 AM  [facility] [address] [place], [place], [address] Phone: [phone]   Fax:  [phone]  Name: Victoria Hall MRN: [id number] Date of Birth: 01-04-1971

## 2015-09-17 NOTE — Patient Instructions (Signed)
     Submax effort 30-40%     Isometric Rotation   Put left index finger on left temple. Gently try to turn head to right, pushing against finger. Hold __5__ seconds. Repeat on the left. Push and release slowly. Repeat ___5_ times. Do __3__ sessions per day.  http://gt2.exer.us/24   Copyright  VHI. All rights reserved.  Isometric Lateral Flexion   Put right index finger on right temple. Gently try to move right ear toward shoulder, pushing against finger. Hold _5___ seconds. Repeat on other side. Push and release slowly. Repeat ___5_ times. Do ___3_ sessions per day.  http://gt2.exer.us/22   Copyright  VHI. All rights reserved.  Isometric Flexion   Put the tips of both index fingers lightly on forehead. Gently press into fingers as if looking toward ground. Resist for __5__ seconds. Press and release slowly. Repeat __5__ times. Do ___3_ sessions per day.  http://gt2.exer.us/18   Copyright  VHI. All rights reserved.  Isometric Extension   Put index fingers gently on back of head. Slowly try to look toward ceiling. Push head into fingers for _5___ seconds. Push and release slowly. Repeat __5__ times. Do __3__ sessions per day.  http://gt2.exer.us/20   Copyright  VHI. All rights reserved.    Strengthening: Isometric Flexion  Using wall for resistance, press right fist into ball using light pressure. Hold _5___ seconds. Repeat __5__ times per set. Do _1___ sets per session. Do __3__ sessions per day.  SHOULDER: Abduction (Isometric)  Use wall as resistance. Press arm against pillow. Keep elbow straight. Hold _5__ seconds. _5__ reps per set, _1__ sets per day, __7_ days per week  Extension (Isometric)  Place left bent elbow and back of arm against wall. Press elbow against wall. Hold _5___ seconds. Repeat __5__ times. Do __3__ sessions per day.  Internal Rotation (Isometric)  Place palm of right fist against door frame, with elbow bent. Press fist against  door frame. Hold _5___ seconds. Repeat __5__ times. Do __3__ sessions per day.  External Rotation (Isometric)  Place back of left fist against door frame, with elbow bent. Press fist against door frame. Hold __5__ seconds. Repeat __5__ times. Do _3___ sessions per day.  Copyright  VHI. All rights reserved.       Ruben Im PT [facility] [address] [place], [place] [address] Phone # [phone] Fax [phone]

## 2015-09-20 DIAGNOSIS — Z6827 Body mass index (BMI) 27.0-27.9, adult: Secondary | ICD-10-CM | POA: Diagnosis not present

## 2015-09-20 DIAGNOSIS — M5412 Radiculopathy, cervical region: Secondary | ICD-10-CM | POA: Diagnosis not present

## 2015-09-20 DIAGNOSIS — M1288 Other specific arthropathies, not elsewhere classified, other specified site: Secondary | ICD-10-CM | POA: Diagnosis not present

## 2015-09-20 DIAGNOSIS — M791 Myalgia: Secondary | ICD-10-CM | POA: Diagnosis not present

## 2015-09-21 ENCOUNTER — Ambulatory Visit: Payer: BLUE CROSS/BLUE SHIELD | Admitting: Physical Therapy

## 2015-09-21 DIAGNOSIS — M5412 Radiculopathy, cervical region: Secondary | ICD-10-CM

## 2015-09-21 DIAGNOSIS — M25511 Pain in right shoulder: Secondary | ICD-10-CM

## 2015-09-21 DIAGNOSIS — M542 Cervicalgia: Secondary | ICD-10-CM

## 2015-09-21 DIAGNOSIS — M6281 Muscle weakness (generalized): Secondary | ICD-10-CM

## 2015-09-21 NOTE — Patient Instructions (Signed)
Victoria Hall PT [facility] [address], [address] [place], [place] [address] Phone # [phone] Fax [phone]    

## 2015-09-21 NOTE — Therapy (Signed)
[facility] [address] [place], [place], [address] Phone: [phone]   Fax:  [phone]  Physical Therapy Treatment  Patient Details  Name: Victoria Hall MRN: [id number] Date of Birth: [birth year] Referring Provider: Metta Clines, MD  Encounter Date: 09/21/2015      PT End of Session - 09/21/15 1843    Visit Number 8   Number of Visits 12   Date for PT Re-Evaluation 09/27/15   Authorization Type Private insurance, patient feels limited due to co-pay + coinsurance with plan   PT Start Time 6270   PT Stop Time 1540   PT Time Calculation (min) 55 min   Activity Tolerance Patient tolerated treatment well      Past Medical History  Diagnosis Date  . Palpitations   . Neurological abnormality     Neg workup with MRI/MRA in 2007 WNL except decreased caliber MCA proximally, distal cavernous portion of left LCA which may represent true stenosis or technique on exam. Evaluated by Dr Linus Salmons.  . Partial seizures ([place])     questionable diag  . Cervical intraepithelial neoplasia grade 3 2002    s/p LEEP Rx repeat pap negative  . S/P cervical discectomy     Dr Vertell Limber, anterior discectomy C5-C7  . Polycystic ovary     multiple ovarian cysts removed  . Fallopian tube disorder     right fallopian tube removed  . Hydrosalpinx     [facility]  . Seizures [facility])     Past Surgical History  Procedure Laterality Date  . Appendectomy    . Tonsillectomy    . Bunionectomy      bunion removal  . Spine surgery      There were no vitals filed for this visit.      Subjective Assessment - 09/21/15 1445    Subjective Saw the neurologist in [facility] yesterday and will be seen on as needed basis.  He recommended vocational rehab.  Did better with DN this past visit.  Reports isometrics going OK.  Considering using snorkel to return to lap swimming.  Pain level  overall  is slightly better.  Going for  gallbladder consult on Thurs.     Currently in Pain? Yes   Pain Score 4    Pain Location Neck   Pain Orientation Right                         [facility] Adult PT Treatment/Exercise - 09/21/15 0001    Neck Exercises: Seated   Other Seated Exercise thoracic extension over ball 8x   Other Seated Exercise deep cervical flexor activation 5x   Cryotherapy   Number Minutes Cryotherapy 10 Minutes   Cryotherapy Location Cervical   Type of Cryotherapy Ice pack   Manual Therapy   Soft tissue mobilization right uppper trap, levator, cervical multifidi; suboccipitals   Myofascial Release suboccipital release 3 min   Muscle Energy Technique right upper trap contract/relax 5x 5 sec hold          Trigger Point Dry Needling - 09/21/15 1842    Consent Given? Yes   Muscles Treated Upper Body --  B cervical multifidi   Upper Trapezius Response Twitch reponse elicited;Palpable increased muscle length   Levator Scapulae Response Twitch response elicited;Palpable increased muscle length   Rhomboids Response Palpable increased muscle length              PT Education -  09/21/15 1842    Education provided Yes   Education Details deep cervical flexor ex   Person(s) Educated Patient   Methods Explanation;Demonstration;Handout   Comprehension Verbalized understanding;Returned demonstration          PT Short Term Goals - 09/21/15 1848    PT SHORT TERM GOAL #1   Title The patient will be indep with HEP for cervical spine stabilization, flexibility, and postural strengthening.   Status Achieved   PT SHORT TERM GOAL #2   Title The patient will improve A/ROM R cervical rotaiton from 44 deg to 50 degrees to demo improving flexibility.   Status Achieved   PT SHORT TERM GOAL #3   Title The patient will perform neck flexion with pain not increasing from baseline level.   Time 4   Period Weeks   Status On-going   PT SHORT TERM GOAL #4   Title The patient will tolerate right  shoulder flexion >120 degrees without increasing pain from baseline level.   Time 4   Period Weeks   Status Partially Met           PT Long Term Goals - 09/21/15 1848    PT LONG TERM GOAL #1   Title The patient will reduce neck disability index from 52% to < or equal to 40% to demo improved self perception of deficits.   Time 8   Period Weeks   Status On-going   PT LONG TERM GOAL #2   Title The patient will report being able to type for 20 minutes with correct body mechanics/positioning.   Time 8   Period Weeks   Status On-going   PT LONG TERM GOAL #3   Title The patient will report pain at worst level to be < or equal to 5/10 for reduction in severity of symptoms.   Time 8   Period Weeks   Status On-going   PT LONG TERM GOAL #4   Title The patient will improve R UE shoulder shrug to 4/5 (from 3/5 baseline).   Time 8   Period Weeks   Status On-going   PT LONG TERM GOAL #5   Title The patient will improve R shoulder abduction to > or equal to 4/5 (from 3/5 baseline).   Time 8   Period Weeks   Status On-going   PT LONG TERM GOAL #6   Title The patient will report return to work at reduced hours with self mgmt of pain/discomfort.   Time 8   Period Weeks   Status On-going               Plan - 09/21/15 1843    Clinical Impression Statement The patient is fearful of physical activity.  She reports her symptoms are easily aggravated and she would like some guidance on her career path.  Her doctor recommended vocational rehab specialist.  An FCE may also be helpful.  Decreased headache today but increased right neck pain especially in levator scap muscle region.  She would like to continue PT after her consultation about her gallbladder and vacation to [place].  Therapist very closely monitoring response with all interventions.     PT Next Visit Plan assess response to DN #3,  check progress toward goals,  ERO next visit;  Neck disability Index; UE strength, deep  cervical flexor strengthening;   thoracic mobs as tolerated;  cold pack for pain control      Patient will benefit from skilled therapeutic intervention in order to improve  the following deficits and impairments:     Visit Diagnosis: Pain in right shoulder  Cervicalgia  Muscle weakness (generalized)  Radiculopathy, cervical region     Problem List Patient Active Problem List   Diagnosis Date Noted  . MUSCLE SPASM, BACK 06/08/2009  . HYDROSALPINX 02/07/2008  . HEADACHE 10/28/2007  . TACHYCARDIA 10/14/2007  . ANXIETY DISORDER 05/28/2007  . POLYARTHRITIS 05/09/2007  . UNSPECIFIED DISORDERS OF NERVOUS SYSTEM 04/22/2007  . ABDOMINAL PAIN RIGHT LOWER QUADRANT 04/08/2007  . PALPITATIONS, RECURRENT 11/07/2006  . PROBLEMS W/SMELL/TASTE 06/29/2006  . TOBACCO ABUSE 01/08/2006  . VISUAL IMPAIRMENT 01/08/2006  . DISTURBANCE, VISUAL NOS 01/08/2006  . FIBROCYSTIC BREAST DISEASE 01/08/2006  . [place] DISEASE, CERVICAL 01/08/2006  . VERTIGO 01/08/2006     Ruben Im, PT 09/21/2015 6:51 PM Phone: [phone] Fax: [phone]  Alvera Singh 09/21/2015, 6:50 PM  [facility] [address] [place], [place], [address] Phone: [phone]   Fax:  [phone]  Name: Victoria Hall MRN: [id number] Date of Birth: [birth year]

## 2015-09-23 ENCOUNTER — Ambulatory Visit: Payer: BLUE CROSS/BLUE SHIELD

## 2015-09-23 DIAGNOSIS — K8020 Calculus of gallbladder without cholecystitis without obstruction: Secondary | ICD-10-CM | POA: Diagnosis not present

## 2015-09-27 ENCOUNTER — Encounter: Payer: Self-pay | Admitting: Gastroenterology

## 2015-09-28 ENCOUNTER — Encounter: Payer: Self-pay | Admitting: Family Medicine

## 2015-09-28 ENCOUNTER — Telehealth: Payer: Self-pay | Admitting: *Deleted

## 2015-09-28 NOTE — Telephone Encounter (Signed)
Received paperwork from Disability Determination Services.   Paperwork was given to [place] to sent to medical records and for scanning.//AB/CMA

## 2015-10-04 ENCOUNTER — Ambulatory Visit: Payer: BLUE CROSS/BLUE SHIELD | Admitting: Neurology

## 2015-10-13 ENCOUNTER — Ambulatory Visit (INDEPENDENT_AMBULATORY_CARE_PROVIDER_SITE_OTHER): Payer: BLUE CROSS/BLUE SHIELD | Admitting: Neurology

## 2015-10-13 ENCOUNTER — Encounter: Payer: Self-pay | Admitting: Neurology

## 2015-10-13 VITALS — HR 106 | Ht 66.0 in | Wt 170.0 lb

## 2015-10-13 DIAGNOSIS — M5412 Radiculopathy, cervical region: Secondary | ICD-10-CM

## 2015-10-13 DIAGNOSIS — R29818 Other symptoms and signs involving the nervous system: Secondary | ICD-10-CM | POA: Diagnosis not present
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
## 2015-10-13 NOTE — Patient Instructions (Signed)
1.  We will refer you to occupational therapy for vocational assessment 2.  Continue dry needling 3.  Follow up in 5 months.

## 2015-10-18 ENCOUNTER — Ambulatory Visit: Payer: BLUE CROSS/BLUE SHIELD | Attending: Family Medicine

## 2015-10-18 ENCOUNTER — Telehealth: Payer: Self-pay | Admitting: *Deleted

## 2015-10-18 DIAGNOSIS — M5412 Radiculopathy, cervical region: Secondary | ICD-10-CM | POA: Diagnosis not present

## 2015-10-18 DIAGNOSIS — M6281 Muscle weakness (generalized): Secondary | ICD-10-CM | POA: Diagnosis not present

## 2015-10-18 DIAGNOSIS — M542 Cervicalgia: Secondary | ICD-10-CM | POA: Diagnosis not present

## 2015-10-18 DIAGNOSIS — M25511 Pain in right shoulder: Secondary | ICD-10-CM | POA: Diagnosis not present

## 2015-10-18 NOTE — Telephone Encounter (Signed)
Forwarded to [place] to scan/email to medical records

## 2015-10-18 NOTE — Therapy (Signed)
[facility] [address] [place], [place], [address] Phone: [phone]   Fax:  [phone]  Physical Therapy Treatment  Patient Details  Name: Victoria Hall MRN: [id number] Date of Birth: 30-May-1970 Referring Provider: Metta Clines, MD  Encounter Date: 10/18/2015      PT End of Session - 10/18/15 1016    Visit Number 9   Date for PT Re-Evaluation 12/13/15   Authorization Type Private insurance, patient feels limited due to co-pay + coinsurance with plan   PT Start Time 0929   PT Stop Time 1030   PT Time Calculation (min) 61 min   Activity Tolerance Patient tolerated treatment well   Behavior During Therapy [facility] for tasks assessed/performed      Past Medical History  Diagnosis Date  . Palpitations   . Neurological abnormality     Neg workup with MRI/MRA in 2007 WNL except decreased caliber MCA proximally, distal cavernous portion of left LCA which may represent true stenosis or technique on exam. Evaluated by Dr Linus Salmons.  . Partial seizures ([place])     questionable diag  . Cervical intraepithelial neoplasia grade 3 2002    s/p LEEP Rx repeat pap negative  . S/P cervical discectomy     Dr Vertell Limber, anterior discectomy C5-C7  . Polycystic ovary     multiple ovarian cysts removed  . Fallopian tube disorder     right fallopian tube removed  . Hydrosalpinx     [facility]  . Seizures [facility])     Past Surgical History  Procedure Laterality Date  . Appendectomy    . Tonsillectomy    . Bunionectomy      bunion removal  . Spine surgery      There were no vitals filed for this visit.      Subjective Assessment - 10/18/15 0930    Subjective Pt with lapse in treatment due to dealing with gallstones symptoms and seeing a specialist.  Saw neuro MD in town and he wants her to continue with dry needling.  Rt arm feels better and strong.  Feels 50% stronger and 20% less pain.     Patient Stated Goals  improve use of right arm, return to work, reduce pain.   Currently in Pain? Yes   Pain Score 3   up to 7/10 with movement   Pain Location Neck   Pain Orientation Right   Pain Descriptors / Indicators Aching   Pain Type Chronic pain   Pain Onset More than a month ago   Pain Frequency Constant   Aggravating Factors  lifting, pulling, walking dog, laundry, dishes   Pain Relieving Factors rest, ice            [facility] PT Assessment - 10/18/15 0001    Assessment   Medical Diagnosis cervical radiculopathy   Hand Dominance Left   Prior Therapy years ago approximately 13 years ago   Precautions   Precautions Other (comment)   Precaution Comments lifting heavy objects   [place] residence   Prior Function   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   Observation/Other Assessments   Focus on Therapeutic Outcomes (FOTO)  59% limitation   AROM   Overall AROM  Deficits   Cervical - Right Side Bend 20   Cervical - Left Side Bend 20   Cervical - Right Rotation 50   Cervical - Left Rotation 55  Strength   Right/Left Shoulder Right;Left   Right Shoulder Flexion 4-/5   Right Shoulder ABduction 4-/5   Right Shoulder Internal Rotation 4/5   Right Shoulder External Rotation 4/5   Left Shoulder Flexion 4+/5   Left Shoulder ABduction 3-/5  pain   Left Shoulder Internal Rotation 4/5   Left Shoulder External Rotation 4/5                     [facility] Adult PT Treatment/Exercise - 10/18/15 0001    Neck Exercises: Seated   Postural Training cervical AROM 3 ways 4x10 seconds each   Modalities   Modalities Electrical Stimulation;Cryotherapy   Cryotherapy   Number Minutes Cryotherapy 15 Minutes   Cryotherapy Location Lumbar Spine   Type of Cryotherapy Ice massage   Electrical Stimulation   Electrical Stimulation Location neck   Electrical Stimulation Action IFC   Electrical Stimulation Parameters 15  minutes   Electrical Stimulation Goals Pain   Manual Therapy   Manual Therapy Myofascial release;Muscle Energy Technique   Soft tissue mobilization right uppper trap, levator, cervical multifidi; suboccipitals   Myofascial Release suboccipital release 3 min   Muscle Energy Technique right upper trap contract/relax 5x 5 sec hold          Trigger Point Dry Needling - 10/18/15 1013    Consent Given? Yes   Muscles Treated Upper Body Upper trapezius;Suboccipitals muscle group  cervical paraspinals and multifidi   Upper Trapezius Response Twitch reponse elicited;Palpable increased muscle length   SubOccipitals Response Palpable increased muscle length                PT Short Term Goals - 10/18/15 0941    PT SHORT TERM GOAL #1   Title The patient will be indep with HEP for cervical spine stabilization, flexibility, and postural strengthening.   Status Achieved   PT SHORT TERM GOAL #2   Title The patient will improve A/ROM R cervical rotaiton from 44 deg to 50 degrees to demo improving flexibility.   Status Achieved   PT SHORT TERM GOAL #3   Title The patient will perform neck flexion with pain not increasing from baseline level.   Time 4   Period Weeks   Status On-going   PT SHORT TERM GOAL #4   Title The patient will tolerate right shoulder flexion >120 degrees without increasing pain from baseline level.   Status Achieved           PT Long Term Goals - 10/18/15 0941    PT LONG TERM GOAL #1   Title Reduce FOTO to < or = to 45% limitation   Time 8   Period Weeks   Status New  FOTO: 59%    PT LONG TERM GOAL #2   Title The patient will report being able to type for 20 minutes with correct body mechanics/positioning.   Status Achieved  30-40   PT LONG TERM GOAL #3   Title The patient will report pain at worst level to be < or equal to 5/10 for reduction in severity of symptoms.   Time 8   Period Weeks   Status On-going   PT LONG TERM GOAL #4   Title The  patient will improve R UE shoulder shrug to 4/5 (from 3/5 baseline).   Status Achieved   PT LONG TERM GOAL #5   Title The patient will improve R shoulder abduction to > or equal to 4/5 (from 3/5 baseline).   Status Achieved  PT LONG TERM GOAL #6   Title The patient will report return to work at reduced hours with self mgmt of pain/discomfort.   Status Deferred  not working- going to have occupational assessment               Plan - 10/18/15 0943    Clinical Impression Statement Pt reports 50% improvement in Rt shoulder strength and 20% reduction in pain since the start of care.  Pt with improved cervical AROM and Rt shoulder strength and AROM.  FOTO score is 59% limitation and pt is not able to lift so therefore not able to return to work.  Pt with tension and hypersensitivity in the Rt neck and upper back and demonstrated increased tissue length after dry needling today.  Pt will benefit from skilled PT for continued needling and advancement of exercise and activity to allow for return to increased activity at home.     Rehab Potential Good   PT Frequency 2x / week   PT Duration 8 weeks   PT Treatment/Interventions ADLs/Self Care Home Management;Cryotherapy;Moist Heat;Therapeutic exercise;Manual techniques;Therapeutic activities;Functional mobility training;Patient/family education;Neuromuscular re-education;Dry needling;Electrical Stimulation;Ultrasound   PT Next Visit Plan assess response to DN #5.  1-2 more DN sessions.  check progress toward goals,   UE strength, deep cervical flexor strengthening;   thoracic mobs as tolerated;  cold pack for pain control   Consulted and Agree with Plan of Care Patient      Patient will benefit from skilled therapeutic intervention in order to improve the following deficits and impairments:  Decreased activity tolerance, Decreased mobility, Decreased strength, Postural dysfunction, Impaired sensation, Pain, Improper body mechanics, Decreased range  of motion  Visit Diagnosis: Pain in right shoulder - Plan: PT plan of care cert/re-cert  Cervicalgia - Plan: PT plan of care cert/re-cert  Muscle weakness (generalized) - Plan: PT plan of care cert/re-cert  Radiculopathy, cervical region - Plan: PT plan of care cert/re-cert     Problem List Patient Active Problem List   Diagnosis Date Noted  . MUSCLE SPASM, BACK 06/08/2009  . HYDROSALPINX 02/07/2008  . HEADACHE 10/28/2007  . TACHYCARDIA 10/14/2007  . ANXIETY DISORDER 05/28/2007  . POLYARTHRITIS 05/09/2007  . UNSPECIFIED DISORDERS OF NERVOUS SYSTEM 04/22/2007  . ABDOMINAL PAIN RIGHT LOWER QUADRANT 04/08/2007  . PALPITATIONS, RECURRENT 11/07/2006  . PROBLEMS W/SMELL/TASTE 06/29/2006  . TOBACCO ABUSE 01/08/2006  . VISUAL IMPAIRMENT 01/08/2006  . DISTURBANCE, VISUAL NOS 01/08/2006  . FIBROCYSTIC BREAST DISEASE 01/08/2006  . [place] DISEASE, CERVICAL 01/08/2006  . VERTIGO 01/08/2006     Sigurd Sos, PT 10/18/2015 10:17 AM  [facility] [address] [place], [place], [address] Phone: [phone]   Fax:  [phone]  Name: AMIRYKAL LARDIE MRN: [id number] Date of Birth: [birth year]

## 2015-11-04 ENCOUNTER — Ambulatory Visit: Payer: BLUE CROSS/BLUE SHIELD | Attending: Family Medicine | Admitting: Physical Therapy

## 2015-11-04 DIAGNOSIS — M5412 Radiculopathy, cervical region: Secondary | ICD-10-CM | POA: Insufficient documentation

## 2015-11-04 DIAGNOSIS — M542 Cervicalgia: Secondary | ICD-10-CM | POA: Diagnosis not present

## 2015-11-04 DIAGNOSIS — M6281 Muscle weakness (generalized): Secondary | ICD-10-CM | POA: Insufficient documentation

## 2015-11-04 DIAGNOSIS — M25511 Pain in right shoulder: Secondary | ICD-10-CM | POA: Diagnosis not present

## 2015-11-04 NOTE — Therapy (Signed)
[facility] [address] [place], [place], [address] Phone: [phone]   Fax:  [phone]  Physical Therapy Treatment  Patient Details  Name: Victoria Hall MRN: [id number] Date of Birth: [birth year] Referring Provider: Metta Clines, MD  Encounter Date: 11/04/2015      PT End of Session - 11/04/15 1050    Visit Number 10   Number of Visits 12   Date for PT Re-Evaluation 12/13/15   Authorization Type Private insurance, patient feels limited due to co-pay + coinsurance with plan   PT Start Time 1015   PT Stop Time 1105   PT Time Calculation (min) 50 min   Activity Tolerance Patient tolerated treatment well      Past Medical History:  Diagnosis Date  . Cervical intraepithelial neoplasia grade 3 2002   s/p LEEP Rx repeat pap negative  . Fallopian tube disorder    right fallopian tube removed  . Hydrosalpinx    [facility]  . Neurological abnormality    Neg workup with MRI/MRA in 2007 WNL except decreased caliber MCA proximally, distal cavernous portion of left LCA which may represent true stenosis or technique on exam. Evaluated by Dr Linus Salmons.  . Palpitations   . Partial seizures ([facility])    questionable diag  . Polycystic ovary    multiple ovarian cysts removed  . S/P cervical discectomy    Dr Vertell Limber, anterior discectomy C5-C7  . Seizures ([place])     Past Surgical History:  Procedure Laterality Date  . APPENDECTOMY    . BUNIONECTOMY     bunion removal  . SPINE SURGERY    . TONSILLECTOMY      There were no vitals filed for this visit.      Subjective Assessment - 11/04/15 1020    Subjective After my last needling I had lots of pain for days with more issues in left UE.  I was in agony after it.   Now it settled down and I'm back to the same old pain.     Currently in Pain? Yes   Pain Score 3    Pain Location Neck   Pain Orientation Right   Pain Type Chronic pain   Pain Radiating  Towards right upper extremity                         [facility] Adult PT Treatment/Exercise - 11/04/15 0001      Moist Heat Therapy   Number Minutes Moist Heat 10 Minutes   Moist Heat Location Cervical     Electrical Stimulation   Electrical Stimulation Location neck   Electrical Stimulation Action with dry needling    Electrical Stimulation Parameters level 1 1/2 right and left multifidi 80pps 10 min   Electrical Stimulation Goals Pain     Manual Therapy   Soft tissue mobilization right uppper trap, levator, cervical multifidi; suboccipitals  right rhomboids; infraspinatus and levator; right subscap   Myofascial Release --   Muscle Energy Technique --          Trigger Point Dry Needling - 11/04/15 1047    Consent Given? Yes   Muscles Treated Upper Body Upper trapezius;Rhomboids;Infraspinatus  bilateral cervical multifidi   Upper Trapezius Response Twitch reponse elicited;Palpable increased muscle length   Levator Scapulae Response Twitch response elicited;Palpable increased muscle length   Rhomboids Response Palpable increased muscle length;Twitch response elicited   Infraspinatus Response Palpable increased muscle length  PT Short Term Goals - 11/04/15 1207      PT SHORT TERM GOAL #1   Title The patient will be indep with HEP for cervical spine stabilization, flexibility, and postural strengthening.   Status Achieved     PT SHORT TERM GOAL #2   Title The patient will improve A/ROM R cervical rotaiton from 44 deg to 50 degrees to demo improving flexibility.   Status Achieved     PT SHORT TERM GOAL #3   Title The patient will perform neck flexion with pain not increasing from baseline level.   Time 4   Period Weeks   Status On-going     PT SHORT TERM GOAL #4   Title The patient will tolerate right shoulder flexion >120 degrees without increasing pain from baseline level.   Status Achieved           PT Long Term Goals -  11/04/15 1207      PT LONG TERM GOAL #1   Title Reduce FOTO to < or = to 45% limitation   Time 8   Period Weeks   Status On-going     PT LONG TERM GOAL #2   Title The patient will report being able to type for 20 minutes with correct body mechanics/positioning.   Time 8   Period Weeks   Status On-going     PT LONG TERM GOAL #3   Title The patient will report pain at worst level to be < or equal to 5/10 for reduction in severity of symptoms.   Time 8   Period Weeks   Status On-going     PT LONG TERM GOAL #4   Title The patient will improve R UE shoulder shrug to 4/5 (from 3/5 baseline).   Status Achieved     PT LONG TERM GOAL #5   Title The patient will improve R shoulder abduction to > or equal to 4/5 (from 3/5 baseline).   Status Achieved     PT LONG TERM GOAL #6   Title The patient will report return to work at reduced hours with self mgmt of pain/discomfort.   Time 8   Period Weeks   Status Deferred               Plan - 11/04/15 1052    Clinical Impression Statement Patient reports a variable response to dry needling.  She requests to try once more along with trial of e-stim with dry needling.  Right > left tender points in right upper trap, rhomboids and levator.  Therapist very closely monitoring response secondary to her reports of increased pain last visit.  She reports her discomfort during all interventions is minimal and tolerable.  Check progress toward goals next visit to determine continuation or change in treatment plan.     PT Next Visit Plan assess response to DN #6 with added e-stim;  check progress toward goals, low level ex as tolerated      Patient will benefit from skilled therapeutic intervention in order to improve the following deficits and impairments:     Visit Diagnosis: Pain in right shoulder  Cervicalgia  Muscle weakness (generalized)  Radiculopathy, cervical region     Problem List Patient Active Problem List   Diagnosis  Date Noted  . MUSCLE SPASM, BACK 06/08/2009  . HYDROSALPINX 02/07/2008  . HEADACHE 10/28/2007  . TACHYCARDIA 10/14/2007  . ANXIETY DISORDER 05/28/2007  . POLYARTHRITIS 05/09/2007  . UNSPECIFIED DISORDERS OF NERVOUS SYSTEM 04/22/2007  . ABDOMINAL  PAIN RIGHT LOWER QUADRANT 04/08/2007  . PALPITATIONS, RECURRENT 11/07/2006  . PROBLEMS W/SMELL/TASTE 06/29/2006  . TOBACCO ABUSE 01/08/2006  . VISUAL IMPAIRMENT 01/08/2006  . DISTURBANCE, VISUAL NOS 01/08/2006  . FIBROCYSTIC BREAST DISEASE 01/08/2006  . [place] DISEASE, CERVICAL 01/08/2006  . VERTIGO 01/08/2006   Ruben Im, PT 11/04/15 12:10 PM Phone: [phone] Fax: [phone]  Alvera Singh 11/04/2015, 12:09 PM  [facility] [address], [place], [place], [address] Phone: [phone]   Fax:  [phone]  Name: Victoria Hall MRN: [id number] Date of Birth: 03-08-1971

## 2015-11-08 DIAGNOSIS — F4312 Post-traumatic stress disorder, chronic: Secondary | ICD-10-CM | POA: Diagnosis not present

## 2015-11-09 ENCOUNTER — Ambulatory Visit: Payer: BLUE CROSS/BLUE SHIELD | Admitting: Physical Therapy

## 2015-11-09 DIAGNOSIS — M542 Cervicalgia: Secondary | ICD-10-CM

## 2015-11-09 DIAGNOSIS — M6281 Muscle weakness (generalized): Secondary | ICD-10-CM

## 2015-11-09 DIAGNOSIS — M25511 Pain in right shoulder: Secondary | ICD-10-CM | POA: Diagnosis not present

## 2015-11-09 DIAGNOSIS — M5412 Radiculopathy, cervical region: Secondary | ICD-10-CM | POA: Diagnosis not present

## 2015-11-09 NOTE — Therapy (Signed)
[facility] [address] [place], [place], [address] Phone: [phone]   Fax:  [phone]  Physical Therapy Treatment  Patient Details  Name: Victoria Hall MRN: [id number] Date of Birth: Aug 06, 1970 Referring Provider: Shon Millet, MD  Encounter Date: 11/09/2015      PT End of Session - 11/09/15 1911    Visit Number 11   Number of Visits 12   Date for PT Re-Evaluation 12/13/15   Authorization Type Private insurance, patient feels limited due to co-pay + coinsurance with plan   PT Start Time 1015   PT Stop Time 1110   PT Time Calculation (min) 55 min   Activity Tolerance Patient tolerated treatment well      Past Medical History:  Diagnosis Date  . Cervical intraepithelial neoplasia grade 3 2002   s/p LEEP Rx repeat pap negative  . Fallopian tube disorder    right fallopian tube removed  . Hydrosalpinx    [facility]  . Neurological abnormality    Neg workup with MRI/MRA in 2007 WNL except decreased caliber MCA proximally, distal cavernous portion of left LCA which may represent true stenosis or technique on exam. Evaluated by Dr Luberta Robertson.  . Palpitations   . Partial seizures (HCC)    questionable diag  . Polycystic ovary    multiple ovarian cysts removed  . S/P cervical discectomy    Dr Venetia Maxon, anterior discectomy C5-C7  . Seizures (HCC)     Past Surgical History:  Procedure Laterality Date  . APPENDECTOMY    . BUNIONECTOMY     bunion removal  . SPINE SURGERY    . TONSILLECTOMY      There were no vitals filed for this visit.      Subjective Assessment - 11/09/15 1018    Subjective I was a little sore but not as bad as before.  Left arm and neck pain.  Right upper trap and subscapular area feels better.     Currently in Pain? Yes   Pain Score 2    Pain Location Neck   Pain Orientation Right   Pain Type Chronic pain            [facility] PT Assessment - 11/09/15 0001       ROM / Strength   AROM / PROM / Strength AROM;Strength  cerv flex 30, ext 20, right and left SB 25, 38, 40 rotation     Strength   Right Shoulder Flexion 4/5   Left Shoulder Horizontal ABduction 4/5                     [facility] Adult PT Treatment/Exercise - 11/09/15 0001      Shoulder Exercises: Standing   Extension Strengthening;Right;Left;10 reps;Theraband   Row Strengthening;Right;Left;10 reps;Theraband     Cryotherapy   Number Minutes Cryotherapy 10 Minutes   Type of Cryotherapy Ice massage     Electrical Stimulation   Electrical Stimulation Location right subscapularis   Electrical Stimulation Action with dry needling   Electrical Stimulation Parameters level 1 1/2 8 min   Electrical Stimulation Goals Pain     Manual Therapy   Soft tissue mobilization right uppper trap, levator, cervical multifidi; suboccipitals  right rhomboids; infraspinatus and levator; right subscap     There ex:  Prone bilateral shoulder horizontal abduction with head lift 8x (palms down)     Trigger Point Dry Needling - 11/09/15 1030    Muscles Treated Upper Body Upper  trapezius;Levator scapulae;Infraspinatus   Upper Trapezius Response Twitch reponse elicited;Palpable increased muscle length   Levator Scapulae Response Twitch response elicited;Palpable increased muscle length   Rhomboids Response Twitch response elicited;Palpable increased muscle length   Infraspinatus Response Twitch response elicited;Palpable increased muscle length   Subscapularis Response Twitch response elicited;Palpable increased muscle length      Right side only.          PT Short Term Goals - 11/09/15 1925      PT SHORT TERM GOAL #1   Title The patient will be indep with HEP for cervical spine stabilization, flexibility, and postural strengthening.   Status Achieved     PT SHORT TERM GOAL #2   Title The patient will improve A/ROM R cervical rotaiton from 44 deg to 50 degrees to demo improving  flexibility.   Status Achieved     PT SHORT TERM GOAL #3   Title The patient will perform neck flexion with pain not increasing from baseline level.   Status Achieved     PT SHORT TERM GOAL #4   Title The patient will tolerate right shoulder flexion >120 degrees without increasing pain from baseline level.   Status Achieved           PT Long Term Goals - 11/09/15 1925      PT LONG TERM GOAL #1   Title Reduce FOTO to < or = to 45% limitation   Time 8   Period Weeks   Status On-going     PT LONG TERM GOAL #2   Title The patient will report being able to type for 20 minutes with correct body mechanics/positioning.   Status Achieved     PT LONG TERM GOAL #3   Title The patient will report pain at worst level to be < or equal to 5/10 for reduction in severity of symptoms.   Status Achieved     PT LONG TERM GOAL #4   Title The patient will improve R UE shoulder shrug to 4/5 (from 3/5 baseline).   Status Achieved     PT LONG TERM GOAL #5   Title The patient will improve R shoulder abduction to > or equal to 4/5 (from 3/5 baseline).   Status Achieved     PT LONG TERM GOAL #6   Title The patient will report return to work at reduced hours with self mgmt of pain/discomfort.   Time 8   Period Weeks   Status On-going               Plan - 11/09/15 1912    Clinical Impression Statement The patient is making progress toward goals although overall progress has been slowed secondary to previous co-morbidities.   All STGs met.   Improvement in cervical AROM which patient attributes to dry needling.  Hold on left sided dry needling secondary to patient's complaints of increased left UE symptoms 2x.  She is able to tolerate low level scapular strengthening without pain exacerbation.  Continue 3-4 visits, then discharge to independent HEP/self management.     PT Next Visit Plan assess response to DN with added e-stim to right subscapularis;  low level ex as tolerated       Patient will benefit from skilled therapeutic intervention in order to improve the following deficits and impairments:     Visit Diagnosis: Pain in right shoulder  Cervicalgia  Muscle weakness (generalized)  Radiculopathy, cervical region     Problem List Patient Active Problem List  Diagnosis Date Noted  . MUSCLE SPASM, BACK 06/08/2009  . HYDROSALPINX 02/07/2008  . HEADACHE 10/28/2007  . TACHYCARDIA 10/14/2007  . ANXIETY DISORDER 05/28/2007  . POLYARTHRITIS 05/09/2007  . UNSPECIFIED DISORDERS OF NERVOUS SYSTEM 04/22/2007  . ABDOMINAL PAIN RIGHT LOWER QUADRANT 04/08/2007  . PALPITATIONS, RECURRENT 11/07/2006  . PROBLEMS W/SMELL/TASTE 06/29/2006  . TOBACCO ABUSE 01/08/2006  . VISUAL IMPAIRMENT 01/08/2006  . DISTURBANCE, VISUAL NOS 01/08/2006  . FIBROCYSTIC BREAST DISEASE 01/08/2006  . [place] DISEASE, CERVICAL 01/08/2006  . VERTIGO 01/08/2006   Ruben Im, PT 11/09/15 7:29 PM Phone: [phone] Fax: [phone]  Alvera Singh 11/09/2015, 7:27 PM  [facility] [address] [place], [place], [address] Phone: [phone]   Fax:  [phone]  Name: Victoria Hall MRN: [id number] Date of Birth: 12-Sep-1970

## 2015-11-15 DIAGNOSIS — F4312 Post-traumatic stress disorder, chronic: Secondary | ICD-10-CM | POA: Diagnosis not present

## 2015-11-18 ENCOUNTER — Ambulatory Visit: Payer: BLUE CROSS/BLUE SHIELD | Admitting: Physical Therapy

## 2015-11-18 DIAGNOSIS — M542 Cervicalgia: Secondary | ICD-10-CM | POA: Diagnosis not present

## 2015-11-18 DIAGNOSIS — M6281 Muscle weakness (generalized): Secondary | ICD-10-CM

## 2015-11-18 DIAGNOSIS — M25511 Pain in right shoulder: Secondary | ICD-10-CM

## 2015-11-18 DIAGNOSIS — M5412 Radiculopathy, cervical region: Secondary | ICD-10-CM | POA: Diagnosis not present

## 2015-11-18 NOTE — Therapy (Signed)
[facility] [address] [place], [place], [address] Phone: [phone]   Fax:  [phone]  Physical Therapy Treatment/Discharge Summary  Patient Details  Name: Victoria Hall MRN: [id number] Date of Birth: 10-Sep-1970 Referring Provider: Metta Clines, MD  Encounter Date: 11/18/2015      PT End of Session - 11/18/15 1117    Visit Number 12   Number of Visits 12   Date for PT Re-Evaluation 12/13/15   Authorization Type Private insurance, patient feels limited due to co-pay + coinsurance with plan   PT Start Time 1100   PT Stop Time 1138   PT Time Calculation (min) 38 min   Activity Tolerance Patient tolerated treatment well      Past Medical History:  Diagnosis Date  . Cervical intraepithelial neoplasia grade 3 2002   s/p LEEP Rx repeat pap negative  . Fallopian tube disorder    right fallopian tube removed  . Hydrosalpinx    [facility]  . Neurological abnormality    Neg workup with MRI/MRA in 2007 WNL except decreased caliber MCA proximally, distal cavernous portion of left LCA which may represent true stenosis or technique on exam. Evaluated by Dr Linus Salmons.  . Palpitations   . Partial seizures (HCC)    questionable diag  . Polycystic ovary    multiple ovarian cysts removed  . S/P cervical discectomy    Dr Vertell Limber, anterior discectomy C5-C7  . Seizures ([place])     Past Surgical History:  Procedure Laterality Date  . APPENDECTOMY    . BUNIONECTOMY     bunion removal  . SPINE SURGERY    . TONSILLECTOMY      There were no vitals filed for this visit.      Subjective Assessment - 11/18/15 1105    Subjective My right shoulder and arm pain has been waking me up at night.  It feels like a razor.  That arm feels tired.  The left side pain has gone away.  Movement to the right is restricted.     Currently in Pain? Yes   Pain Score 6    Pain Location Neck   Pain Orientation Right             [facility] PT Assessment - 11/18/15 0001      Observation/Other Assessments   Focus on Therapeutic Outcomes (FOTO)  65% limitation     AROM   Right Shoulder Flexion 150 Degrees   Right Shoulder ABduction 170 Degrees  painful in shoulder   Right Shoulder External Rotation 84 Degrees   Cervical - Right Side Bend 20   Cervical - Left Side Bend 25   Cervical - Right Rotation 30   Cervical - Left Rotation 55     Strength   Right Shoulder Flexion 4-/5   Right Shoulder ABduction 4-/5   Right Shoulder Internal Rotation 4-/5   Right Shoulder External Rotation 4-/5   Right Hand Grip (lbs) 35   Left Hand Grip (lbs) 75 lb                     [facility] Adult PT Treatment/Exercise - 11/18/15 0001      Self-Care   Other Self-Care Comments  Discussion of safe self progression of HEP including upper quadruant strengthening principles     Hand Exercises for Cervical Radiculopathy   Other Hand Exercise for Cervical Radiculopathy discussed putty ex for intrinisic hand strengthening  PT Education - 11/18/15 1623    Education provided Yes   Education Details self care; HEP progression including UE strengthening    Person(s) Educated Patient   Methods Explanation;Demonstration   Comprehension Verbalized understanding          PT Short Term Goals - 11/18/15 1117      PT SHORT TERM GOAL #1   Title The patient will be indep with HEP for cervical spine stabilization, flexibility, and postural strengthening.   Status Achieved     PT SHORT TERM GOAL #2   Title The patient will improve A/ROM R cervical rotaiton from 44 deg to 50 degrees to demo improving flexibility.   Status Achieved     PT SHORT TERM GOAL #3   Title The patient will perform neck flexion with pain not increasing from baseline level.   Status Achieved     PT SHORT TERM GOAL #4   Title The patient will tolerate right shoulder flexion >120 degrees without increasing pain from  baseline level.   Status Achieved           PT Long Term Goals - 11/18/15 1118      PT LONG TERM GOAL #1   Title Reduce FOTO to < or = to 45% limitation   Time 8   Period Weeks   Status Not Met     PT LONG TERM GOAL #2   Title The patient will report being able to type for 20 minutes with correct body mechanics/positioning.   Status Achieved     PT LONG TERM GOAL #3   Title The patient will report pain at worst level to be < or equal to 5/10 for reduction in severity of symptoms.   Status Partially Met     PT LONG TERM GOAL #4   Title The patient will improve R UE shoulder shrug to 4/5 (from 3/5 baseline).   Status Not Met     PT LONG TERM GOAL #5   Title The patient will improve R shoulder abduction to > or equal to 4/5 (from 3/5 baseline).   Status Achieved     PT LONG TERM GOAL #6   Title The patient will report return to work at reduced hours with self mgmt of pain/discomfort.   Status Achieved               Plan - 11/18/15 1624    Clinical Impression Statement The patient reports some improvement in overall function and pain although her FOTO functional outcome score did not improve.  Her ROM improvements have been variable with some days better than others.  Of concern, her right grip strength has decreased since initial.  Discussed intrinsic hand strengthening for home.  Variable improvements with dry needling and manual techniques.  Low tolerance for exercise as well secondary to numerous co-morbities including multi joint arthritis.  Recommend discharge from PT secondary to plateau in progress.  Partial goals met.       PHYSICAL THERAPY DISCHARGE SUMMARY  Visits from Start of Care: 12  Current functional level related to goals / functional outcomes: See clinical impressions above   Remaining deficits: As above   Education / Equipment: HEP progression  Plan: Patient agrees to discharge.  Patient goals were partially met. Patient is being  discharged due to lack of progress.  ?????       Patient will benefit from skilled therapeutic intervention in order to improve the following deficits and impairments:  Visit Diagnosis: Pain in right shoulder  Cervicalgia  Muscle weakness (generalized)  Radiculopathy, cervical region     Problem List Patient Active Problem List   Diagnosis Date Noted  . MUSCLE SPASM, BACK 06/08/2009  . HYDROSALPINX 02/07/2008  . HEADACHE 10/28/2007  . TACHYCARDIA 10/14/2007  . ANXIETY DISORDER 05/28/2007  . POLYARTHRITIS 05/09/2007  . UNSPECIFIED DISORDERS OF NERVOUS SYSTEM 04/22/2007  . ABDOMINAL PAIN RIGHT LOWER QUADRANT 04/08/2007  . PALPITATIONS, RECURRENT 11/07/2006  . PROBLEMS W/SMELL/TASTE 06/29/2006  . TOBACCO ABUSE 01/08/2006  . VISUAL IMPAIRMENT 01/08/2006  . DISTURBANCE, VISUAL NOS 01/08/2006  . FIBROCYSTIC BREAST DISEASE 01/08/2006  . [place] DISEASE, CERVICAL 01/08/2006  . VERTIGO 01/08/2006   Ruben Im, PT 11/18/15 4:30 PM Phone: [phone] Fax: [phone] Alvera Singh 11/18/2015, 4:29 PM  [facility] [address], [place], [place], [address] Phone: [phone]   Fax:  [phone]  Name: Victoria Hall MRN: [id number] Date of Birth: 07/17/1970

## 2015-11-23 ENCOUNTER — Encounter: Payer: BLUE CROSS/BLUE SHIELD | Admitting: Physical Therapy

## 2015-11-25 ENCOUNTER — Encounter: Payer: BLUE CROSS/BLUE SHIELD | Admitting: Physical Therapy

## 2015-11-29 ENCOUNTER — Ambulatory Visit (INDEPENDENT_AMBULATORY_CARE_PROVIDER_SITE_OTHER): Payer: BLUE CROSS/BLUE SHIELD | Admitting: Gastroenterology

## 2015-11-29 ENCOUNTER — Encounter: Payer: Self-pay | Admitting: Gastroenterology

## 2015-11-29 VITALS — BP 94/70 | HR 80 | Ht 65.5 in | Wt 171.4 lb

## 2015-11-29 DIAGNOSIS — K5900 Constipation, unspecified: Secondary | ICD-10-CM | POA: Diagnosis not present

## 2015-11-29 DIAGNOSIS — R197 Diarrhea, unspecified: Secondary | ICD-10-CM

## 2015-11-29 DIAGNOSIS — R1084 Generalized abdominal pain: Secondary | ICD-10-CM

## 2015-11-29 DIAGNOSIS — K8020 Calculus of gallbladder without cholecystitis without obstruction: Secondary | ICD-10-CM

## 2015-11-29 MED ORDER — NA SULFATE-K SULFATE-MG SULF 17.5-3.13-1.6 GM/177ML PO SOLN: 1.0000 | Freq: Once | ORAL | 0 refills | Status: AC

## 2015-11-29 NOTE — Patient Instructions (Addendum)
Start over the counter IBgard 1-2 capsules by mouth three times a day as needed.   You have been scheduled for an endoscopy and colonoscopy. Please follow the written instructions given to you at your visit today. Please pick up your prep supplies at the pharmacy within the next 1-3 days. If you use inhalers (even only as needed), please bring them with you on the day of your procedure. Your physician has requested that you go to www.startemmi.com and enter the access code given to you at your visit today. This web site gives a general overview about your procedure. However, you should still follow specific instructions given to you by our office regarding your preparation for the procedure.  Normal BMI (Body Mass Index- based on height and weight) is between 19 and 25. Your BMI today is Body mass index is 28.08 kg/m. Marland Kitchen Please consider follow up  regarding your BMI with your Primary Care Provider.  Thank you for choosing me and [facility].  Pricilla Riffle. Dagoberto Ligas., MD., Marval Regal

## 2015-11-29 NOTE — Progress Notes (Signed)
    History of Present Illness: This is a 45 year old female referred by Victoria Ok, MD for the evaluation of intermittent epigastric pain, intermittent generalized abdominal pain, alternating diarrhea and constipation. These symptoms have been present for about 8 years. She typically has more abdominal pain associated with episodes of diarrhea. She has found that raw onions and garlic always lead to epigastric pain so she avoids them. She has also found other foods that help to regularize her alternating bowel pattern. She was tried on dicyclomine in the past however she found the medication sedating so it was discontinued. H. pylori breath test performed in June was negative. Limited RUQ abdominal ultrasound performed in June below. Denies weight loss, change in stool caliber, melena, hematochezia, nausea, vomiting, dysphagia, chest pain.   Limited RUQ abdominal ultrasound on 09/14/2015. IMPRESSION: 1. Cholelithiasis without evidence of acute cholecystitis. 2. No acute findings. Common bile duct: Diameter: Normal at 2 mm.  Review of Systems: Pertinent positive and negative review of systems were noted in the above HPI section. All other review of systems were otherwise negative.  Current Medications, Allergies, Past Medical History, Past Surgical History, Family History and Social History were reviewed in [employer] record.  Physical Exam: General: Well developed, well nourished, no acute distress Head: Normocephalic and atraumatic Eyes:  sclerae anicteric, EOMI Ears: Normal auditory acuity Mouth: No deformity or lesions Neck: Supple, no masses or thyromegaly Lungs: Clear throughout to auscultation Heart: Regular rate and rhythm; no murmurs, rubs or bruits Abdomen: Soft, mild tenderness in her left and right upper quadrants and left and right flanks and non distended. No masses, hepatosplenomegaly or hernias noted. Normal Bowel sounds Rectal: deferred to  colonoscopy Musculoskeletal: Symmetrical with no gross deformities  Skin: No lesions on visible extremities Pulses:  Normal pulses noted Extremities: No clubbing, cyanosis, edema or deformities noted Neurological: Alert oriented x 4, grossly nonfocal Cervical Nodes:  No significant cervical adenopathy Inguinal Nodes: No significant inguinal adenopathy Psychological:  Alert and cooperative. Normal mood and affect  Assessment and Recommendations:  1. Epigastric pain. Intermittent generalized abdominal pain. Alternating bowel habits. Suspected IBS, alternating pattern. R/O IBD, ulcer, neoplasm, GERD. Avoid any foods that trigger symptoms. IBgard 1-2 po tid prn. If symptoms not adequately controlled will add a daily PPI. Schedule colonoscopy and EGD. The risks (including bleeding, perforation, infection, missed lesions, medication reactions and possible hospitalization or surgery if complications occur), benefits, and alternatives to colonoscopy with possible biopsy and possible polypectomy were discussed with the patient and they consent to proceed. The risks (including bleeding, perforation, infection, missed lesions, medication reactions and possible hospitalization or surgery if complications occur), benefits, and alternatives to endoscopy with possible biopsy and possible dilation were discussed with the patient and they consent to proceed.   2. Cholelithiasis. Possibly symptomatic however IBS, GERD or gastritis seem more likely to be the primary problem.    cc: Victoria Ok, MD [address] [place], [place] [address]

## 2015-11-30 ENCOUNTER — Encounter: Payer: BLUE CROSS/BLUE SHIELD | Admitting: Physical Therapy

## 2015-12-01 ENCOUNTER — Ambulatory Visit (HOSPITAL_BASED_OUTPATIENT_CLINIC_OR_DEPARTMENT_OTHER)
Admission: RE | Admit: 2015-12-01 | Discharge: 2015-12-01 | Disposition: A | Discharge status: Home / Self Care | Payer: BLUE CROSS/BLUE SHIELD | Source: Ambulatory Visit | Attending: Family Medicine | Admitting: Family Medicine

## 2015-12-01 ENCOUNTER — Encounter: Payer: Self-pay | Admitting: Family Medicine

## 2015-12-01 ENCOUNTER — Ambulatory Visit (INDEPENDENT_AMBULATORY_CARE_PROVIDER_SITE_OTHER): Payer: BLUE CROSS/BLUE SHIELD | Admitting: Family Medicine

## 2015-12-01 VITALS — BP 102/66 | HR 77 | Temp 98.6°F | Ht 65.5 in | Wt 172.0 lb

## 2015-12-01 DIAGNOSIS — M25551 Pain in right hip: Secondary | ICD-10-CM | POA: Insufficient documentation

## 2015-12-01 DIAGNOSIS — R21 Rash and other nonspecific skin eruption: Secondary | ICD-10-CM | POA: Diagnosis not present

## 2015-12-01 NOTE — Patient Instructions (Signed)
Please go downstairs for a hip x-ray.   I will be in touch with the results- if negative we might consider a steroid injection into your right greater trochanter for bursitis Continue to use scalp oil as needed for your scalp, and you might try a very mild steroid such as OTC cortisone on your face for a short period of time. If your rash does not clear up please let me know!

## 2015-12-01 NOTE — Progress Notes (Signed)
[place] at [facility] [address] [place], [place] [address] [phone]  Date:  12/01/2015   Name:  Victoria Hall   DOB:  [birth year]   MRN:  [id number]  PCP:  Lamar Blinks, MD    Chief Complaint: Rash (c/o rash on face and around hairline that is itchy and occ burns Pt states that rash comes and goes but worse past 3 days. Right hip pain that has been off and on for years. )   History of Present Illness:  Victoria Hall is a 45 y.o. very pleasant female patient who presents with the following:  She is here today with a recurrent rash that she has had a few times over the years.  It most recently occurred about 3-4 months ago and went away with home remedires.  She first noted the rash maybe 4-5 years ago. She has never had a bx or any definite dx of this rash. She is here today as she also noted her left elbow was swollen and red a couple of weeks ago.  This resolved on it's own and is no longer bothersome.  However, this combined with the rash made her think about psoriatic arthritis which was worriesome.   Also she has noted a "deep, grinding pain" in her right hip for the last 3-4 days. It may even make her limp.  The hip has bothered her slightly for "as long as I had that dx of polyarticular arthritis, maybe 10 years."  However the sx have gotten a lot worse.   No recent injury, no new activity, no falls, no increase of activity No recent films of her hip  She has reached maximal improvement for her right brachial plexus injury. No longer doing PT for this injury  In the past she has tried a couple of different dandruff shampoos but these do not help. Scalp oils can help to treat her rash LMP 11/19/15  Patient Active Problem List   Diagnosis Date Noted  . MUSCLE SPASM, BACK 06/08/2009  . HYDROSALPINX 02/07/2008  . HEADACHE 10/28/2007  . TACHYCARDIA 10/14/2007  . ANXIETY DISORDER 05/28/2007  . POLYARTHRITIS  05/09/2007  . UNSPECIFIED DISORDERS OF NERVOUS SYSTEM 04/22/2007  . ABDOMINAL PAIN RIGHT LOWER QUADRANT 04/08/2007  . PALPITATIONS, RECURRENT 11/07/2006  . PROBLEMS W/SMELL/TASTE 06/29/2006  . TOBACCO ABUSE 01/08/2006  . VISUAL IMPAIRMENT 01/08/2006  . DISTURBANCE, VISUAL NOS 01/08/2006  . FIBROCYSTIC BREAST DISEASE 01/08/2006  . Carrier DISEASE, CERVICAL 01/08/2006  . VERTIGO 01/08/2006    Past Medical History:  Diagnosis Date  . Anxiety   . Arthritis   . Cervical intraepithelial neoplasia grade 3 2002   s/p LEEP Rx repeat pap negative  . Fallopian tube disorder    right fallopian tube removed  . Gallstones 09/2015   On CT  . Hydrosalpinx    [facility]  . Interstitial cystitis   . Neurological abnormality    Neg workup with MRI/MRA in 2007 WNL except decreased caliber MCA proximally, distal cavernous portion of left LCA which may represent true stenosis or technique on exam. Evaluated by Dr Linus Salmons.  . Palpitations   . Partial seizures (HCC)    questionable diag  . Polycystic ovary    multiple ovarian cysts removed  . S/P cervical discectomy    Dr Vertell Limber, anterior discectomy C5-C7  . Seizures ([place])     Past Surgical History:  Procedure Laterality Date  . APPENDECTOMY  1986  .  BUNIONECTOMY Left    bunion removal  . CERVICAL DISC SURGERY  2005   C5-C7  . LEEP  1998  . TONSILLECTOMY  2001    Social History  Substance Use Topics  . Smoking status: Former Smoker    Packs/day: 0.50    Years: 10.00    Types: Cigarettes    Quit date: 11/02/2015  . Smokeless tobacco: Never Used  . Alcohol use 0.6 - 1.2 oz/week    1 - 2 Standard drinks or equivalent per week     Comment: occasionally 0-2 per day    Family History  Problem Relation Age of Onset  . Adopted: Yes    Allergies  Allergen Reactions  . Codeine Nausea And Vomiting  . Xanax [Alprazolam]     Bruising    Medication list has been reviewed and updated.  Current Outpatient Prescriptions  on File Prior to Visit  Medication Sig Dispense Refill  . Multiple Vitamins-Minerals (WOMENS MULTIVITAMIN PO) Take 1 tablet by mouth daily.     No current facility-administered medications on file prior to visit.     Review of Systems:  As per HPI- otherwise negative. No fever or chills. She continues to have chronic abd pain- this is not new and is being managed by GI specialist.   No redness or wound of her hip No chest pain or SOB  Physical Examination: Vitals:   12/01/15 1308  BP: 102/66  Pulse: 77  Temp: 98.6 F (37 C)   Vitals:   12/01/15 1308  Weight: 172 lb (78 kg)  Height: 5' 5.5" (1.664 m)   Body mass index is 28.19 kg/m. Ideal Body Weight: Weight in (lb) to have BMI = 25: 152.2  GEN: WDWN, NAD, Non-toxic, A & O x 3, mild overweight, looks well HEENT: Atraumatic, Normocephalic. Neck supple. No masses, No LAD.  Bilateral TM wnl, oropharynx normal.  PEERL,EOMI.   On her forehead and just around her hairline are scattered, discrete, slightly dry and scaly appearing macules.   No other rash noted Ears and Nose: No external deformity. CV: RRR, No M/G/R. No JVD. No thrill. No extra heart sounds. PULM: CTA B, no wheezes, crackles, rhonchi. No retractions. No resp. distress. No accessory muscle use. ABD: S, NT, ND, +BS. No rebound. No HSM. EXTR: No c/c/e.  Left elbow is now normal in appearance and to exam Right hip: she notes tenderness in the lateral hip over the greater trochanter, but also notes pain in the anterior hip over the joint.  No pain with hip flexion, but she does have pain with abduction/ adduction.  No redness or skin change over the hip NEURO Normal gait.  PSYCH: Normally interactive. Conversant. Not depressed or anxious appearing.  Calm demeanor.   Dg Hip Unilat W Or W/o Pelvis 2-3 Views Right  Result Date: 12/01/2015 CLINICAL DATA:  Posterior and right lateral hip pain for several weeks, no injury EXAM: DG HIP (WITH OR WITHOUT PELVIS) 2-3V RIGHT  COMPARISON:  None. FINDINGS: The hip joint spaces appear relatively well preserved with very little degenerative change. No acute abnormality is seen. The pelvic rami are intact. The SI joints are corticated. The sacral foramina appear normal. IMPRESSION: Negative. Electronically Signed   By: Ivar Drape M.D.   On: 12/01/2015 14:11    Assessment and Plan: Right hip pain - Plan: DG HIP UNILAT W OR W/O PELVIS 2-3 VIEWS RIGHT  Rash and nonspecific skin eruption  Recurrent, non specific rash on her face.  Counseled  that I do not highly suspect psoriasis as she has no rash on her legs or face.  She plans to use OTC cortisone cream sparingly and the moisturizing scalp oil that she has at home.  If this does not resolve her sx she will let me know Hip pain; characteristics of both GTB and pain in the joint.  Plan to get hip films; if benign consider doing an injection to the greater trochanteric bursa.  She agrees with plan  Signed Lamar Blinks, MD

## 2015-12-01 NOTE — Progress Notes (Signed)
Pre visit review using our clinic review tool, if applicable. No additional management support is needed unless otherwise documented below in the visit note. 

## 2015-12-02 ENCOUNTER — Encounter: Payer: BLUE CROSS/BLUE SHIELD | Admitting: Physical Therapy

## 2015-12-02 DIAGNOSIS — R1032 Left lower quadrant pain: Secondary | ICD-10-CM | POA: Diagnosis not present

## 2015-12-02 DIAGNOSIS — Z01419 Encounter for gynecological examination (general) (routine) without abnormal findings: Secondary | ICD-10-CM | POA: Diagnosis not present

## 2015-12-02 DIAGNOSIS — Z1389 Encounter for screening for other disorder: Secondary | ICD-10-CM | POA: Diagnosis not present

## 2015-12-02 DIAGNOSIS — Z6828 Body mass index (BMI) 28.0-28.9, adult: Secondary | ICD-10-CM | POA: Diagnosis not present

## 2015-12-02 DIAGNOSIS — Z113 Encounter for screening for infections with a predominantly sexual mode of transmission: Secondary | ICD-10-CM | POA: Diagnosis not present

## 2015-12-02 DIAGNOSIS — Z130 Encounter for screening for diseases of the blood and blood-forming organs and certain disorders involving the immune mechanism: Secondary | ICD-10-CM | POA: Diagnosis not present

## 2015-12-03 DIAGNOSIS — N7011 Chronic salpingitis: Secondary | ICD-10-CM | POA: Diagnosis not present

## 2015-12-03 DIAGNOSIS — E282 Polycystic ovarian syndrome: Secondary | ICD-10-CM | POA: Diagnosis not present

## 2015-12-07 ENCOUNTER — Encounter: Payer: BLUE CROSS/BLUE SHIELD | Admitting: Physical Therapy

## 2015-12-09 ENCOUNTER — Encounter: Payer: BLUE CROSS/BLUE SHIELD | Admitting: Physical Therapy

## 2015-12-14 ENCOUNTER — Encounter: Payer: BLUE CROSS/BLUE SHIELD | Admitting: Physical Therapy

## 2015-12-15 ENCOUNTER — Encounter: Payer: Self-pay | Admitting: Gastroenterology

## 2015-12-16 ENCOUNTER — Encounter: Payer: BLUE CROSS/BLUE SHIELD | Admitting: Physical Therapy

## 2015-12-20 ENCOUNTER — Telehealth: Payer: Self-pay | Admitting: Gastroenterology

## 2015-12-21 NOTE — Telephone Encounter (Signed)
Pharmacy ran $50 coupon on her Suprep successfully.  Called patient and told her they were ordering it and it should be available tomorrow afternoon.  Patient agreed.

## 2015-12-23 DIAGNOSIS — M791 Myalgia: Secondary | ICD-10-CM | POA: Diagnosis not present

## 2015-12-23 DIAGNOSIS — Z6827 Body mass index (BMI) 27.0-27.9, adult: Secondary | ICD-10-CM | POA: Diagnosis not present

## 2015-12-23 DIAGNOSIS — M5416 Radiculopathy, lumbar region: Secondary | ICD-10-CM | POA: Diagnosis not present

## 2015-12-23 DIAGNOSIS — M5412 Radiculopathy, cervical region: Secondary | ICD-10-CM | POA: Diagnosis not present

## 2015-12-28 ENCOUNTER — Telehealth: Payer: Self-pay | Admitting: Gastroenterology

## 2015-12-28 NOTE — Telephone Encounter (Signed)
Returned patient's call and she was concerned about taking her cyclobenazeprine the night prior to her procedure.  I advised her that it would be fine to take and that it should not interfere with her procedure.  This is a new medication prescribed to her last Thursday and she was concerned.  All questions were answered.

## 2015-12-29 ENCOUNTER — Telehealth: Payer: Self-pay | Admitting: Gastroenterology

## 2015-12-29 ENCOUNTER — Encounter: Payer: Self-pay | Admitting: Gastroenterology

## 2015-12-29 ENCOUNTER — Ambulatory Visit (AMBULATORY_SURGERY_CENTER): Payer: BLUE CROSS/BLUE SHIELD | Admitting: Gastroenterology

## 2015-12-29 VITALS — BP 113/61 | HR 74 | Temp 99.5°F | Resp 17 | Ht 65.0 in | Wt 171.0 lb

## 2015-12-29 DIAGNOSIS — R1084 Generalized abdominal pain: Secondary | ICD-10-CM

## 2015-12-29 DIAGNOSIS — D120 Benign neoplasm of cecum: Secondary | ICD-10-CM

## 2015-12-29 DIAGNOSIS — D125 Benign neoplasm of sigmoid colon: Secondary | ICD-10-CM | POA: Diagnosis not present

## 2015-12-29 DIAGNOSIS — R194 Change in bowel habit: Secondary | ICD-10-CM | POA: Diagnosis not present

## 2015-12-29 MED ORDER — SODIUM CHLORIDE 0.9 % IV SOLN: 500.0000 mL | INTRAVENOUS | Status: DC

## 2015-12-29 MED ORDER — GLYCOPYRROLATE 2 MG PO TABS: 2.0000 mg | ORAL_TABLET | Freq: Two times a day (BID) | ORAL | 11 refills | Status: DC

## 2015-12-29 NOTE — Op Note (Signed)
[place] Patient Name: Victoria Hall Procedure Date: 12/29/2015 2:20 PM MRN: [id number] Endoscopist: Ladene Artist , MD Age: 45 Referring MD:  Date of Birth: January 18, 1971 Gender: Female Account #: [id number] Procedure:                Colonoscopy Indications:              Generalized abdominal pain, Change in bowel habits Medicines:                Monitored Anesthesia Care Procedure:                Pre-Anesthesia Assessment:                           - Prior to the procedure, a History and Physical                            was performed, and patient medications and                            allergies were reviewed. The patient's tolerance of                            previous anesthesia was also reviewed. The risks                            and benefits of the procedure and the sedation                            options and risks were discussed with the patient.                            All questions were answered, and informed consent                            was obtained. Prior Anticoagulants: The patient has                            taken no previous anticoagulant or antiplatelet                            agents. ASA Grade Assessment: II - A patient with                            mild systemic disease. After reviewing the risks                            and benefits, the patient was deemed in                            satisfactory condition to undergo the procedure.                           After obtaining informed consent, the colonoscope  was passed under direct vision. Throughout the                            procedure, the patient's blood pressure, pulse, and                            oxygen saturations were monitored continuously. The                            Model PCF-H190DL [id number]) scope was introduced                            through the anus and advanced to the the terminal                            ileum.  The ileocecal valve, appendiceal orifice,                            and rectum were photographed. The quality of the                            bowel preparation was good. The colonoscopy was                            performed without difficulty. The patient tolerated                            the procedure well. Scope In: 2:28:35 PM Scope Out: 2:44:00 PM Scope Withdrawal Time: 0 hours 13 minutes 33 seconds  Total Procedure Duration: 0 hours 15 minutes 25 seconds  Findings:                 The perianal and digital rectal examinations were                            normal.                           A 15 mm polyp was found in the cecum. The polyp was                            sessile. The polyp was removed with a piecemeal                            technique using a hot snare. Resection and                            retrieval were complete.                           A 5 mm polyp was found in the sigmoid colon. The                            polyp was sessile. The polyp was removed with a  cold snare. Resection and retrieval were complete.                           The terminal ileum appeared normal.                           The exam was otherwise without abnormality on                            direct and retroflexion views. Complications:            No immediate complications. Estimated blood loss:                            None. Estimated Blood Loss:     Estimated blood loss: none. Impression:               - One 15 mm polyp in the cecum, removed piecemeal                            using a hot snare. Resected and retrieved.                           - One 5 mm polyp in the sigmoid colon, removed with                            a cold snare. Resected and retrieved.                           - The examined portion of the ileum was normal.                           - The examination was otherwise normal on direct                            and  retroflexion views. Recommendation:           - Repeat colonoscopy in 1 year for surveillance                            after piecemeal polypectomy and surveillance                            colonoscopy in 3 years.                           - Patient has a contact number available for                            emergencies. The signs and symptoms of potential                            delayed complications were discussed with the                            patient. Return  to normal activities tomorrow.                            Written discharge instructions were provided to the                            patient.                           - Resume previous diet.                           - Continue present medications.                           - Await pathology results.                           - No aspirin, ibuprofen, naproxen, or other                            non-steroidal anti-inflammatory drugs for 2 weeks                            after polyp removal. Ladene Artist, MD 12/29/2015 2:55:47 PM This report has been signed electronically.

## 2015-12-29 NOTE — Progress Notes (Signed)
Called to room to assist during endoscopic procedure.  Patient ID and intended procedure confirmed with present staff. Received instructions for my participation in the procedure from the performing physician.  

## 2015-12-29 NOTE — Patient Instructions (Signed)
YOU HAD AN ENDOSCOPIC PROCEDURE TODAY AT [facility]:   Refer to the procedure report that was given to you for any specific questions about what was found during the examination.  If the procedure report does not answer your questions, please call your gastroenterologist to clarify.  If you requested that your care partner not be given the details of your procedure findings, then the procedure report has been included in a sealed envelope for you to review at your convenience later.  YOU SHOULD EXPECT: Some feelings of bloating in the abdomen. Passage of more gas than usual.  Walking can help get rid of the air that was put into your GI tract during the procedure and reduce the bloating. If you had a lower endoscopy (such as a colonoscopy or flexible sigmoidoscopy) you may notice spotting of blood in your stool or on the toilet paper. If you underwent a bowel prep for your procedure, you may not have a normal bowel movement for a few days.  Please Note:  You might notice some irritation and congestion in your nose or some drainage.  This is from the oxygen used during your procedure.  There is no need for concern and it should clear up in a day or so.  SYMPTOMS TO REPORT IMMEDIATELY:   Following lower endoscopy (colonoscopy or flexible sigmoidoscopy):  Excessive amounts of blood in the stool  Significant tenderness or worsening of abdominal pains  Swelling of the abdomen that is new, acute  Fever of 100F or higher   Following upper endoscopy (EGD)  Vomiting of blood or coffee ground material  New chest pain or pain under the shoulder blades  Painful or persistently difficult swallowing  New shortness of breath  Fever of 100F or higher  Black, tarry-looking stools  For urgent or emergent issues, a gastroenterologist can be reached at any hour by calling [phone].   DIET:  We do recommend a small meal at first, but then you may proceed to your regular diet.  Drink  plenty of fluids but you should avoid alcoholic beverages for 24 hours.  ACTIVITY:  You should plan to take it easy for the rest of today and you should NOT DRIVE or use heavy machinery until tomorrow (because of the sedation medicines used during the test).    FOLLOW UP: Our staff will call the number listed on your records the next business day following your procedure to check on you and address any questions or concerns that you may have regarding the information given to you following your procedure. If we do not reach you, we will leave a message.  However, if you are feeling well and you are not experiencing any problems, there is no need to return our call.  We will assume that you have returned to your regular daily activities without incident.  If any biopsies were taken you will be contacted by phone or by letter within the next 1-3 weeks.  Please call us at [phone] if you have not heard about the biopsies in 3 weeks.    SIGNATURES/CONFIDENTIALITY: You and/or your care partner have signed paperwork which will be entered into your electronic medical record.  These signatures attest to the fact that that the information above on your After Visit Summary has been reviewed and is understood.  Full responsibility of the confidentiality of this discharge information lies with you and/or your care-partner.   No Aspirin,ibuprofen,naproxen,or other non-steroidal anti-inflammatory drugs for 2 days,but resume  remainder of medications. Information given on polyps.

## 2015-12-29 NOTE — Op Note (Signed)
[facility] Patient Name: Alois Drath Procedure Date: 12/29/2015 2:19 PM MRN: [id number] Endoscopist: Ladene Artist , MD Age: 45 Referring MD:  Date of Birth: February 21, 1971 Gender: Female Account #: [id number] Procedure:                Upper GI endoscopy Indications:              Generalized abdominal pain Medicines:                Monitored Anesthesia Care Procedure:                Pre-Anesthesia Assessment:                           - Prior to the procedure, a History and Physical                            was performed, and patient medications and                            allergies were reviewed. The patient's tolerance of                            previous anesthesia was also reviewed. The risks                            and benefits of the procedure and the sedation                            options and risks were discussed with the patient.                            All questions were answered, and informed consent                            was obtained. Prior Anticoagulants: The patient has                            taken no previous anticoagulant or antiplatelet                            agents. ASA Grade Assessment: II - A patient with                            mild systemic disease. After reviewing the risks                            and benefits, the patient was deemed in                            satisfactory condition to undergo the procedure.                           After obtaining informed consent, the endoscope was  passed under direct vision. Throughout the                            procedure, the patient's blood pressure, pulse, and                            oxygen saturations were monitored continuously. The                            Model GIF-[id number]) scope was introduced                            through the mouth, and advanced to the second part                            of duodenum. The upper GI  endoscopy was                            accomplished without difficulty. The patient                            tolerated the procedure well. Scope In: Scope Out: Findings:                 The esophagus was normal.                           The stomach was normal.                           The examined duodenum was normal.                           The cardia and gastric fundus were normal on                            retroflexion. Complications:            No immediate complications. Estimated Blood Loss:     Estimated blood loss: none. Impression:               - Normal esophagus.                           - Normal stomach.                           - Normal examined duodenum.                           - No specimens collected. Recommendation:           - Patient has a contact number available for                            emergencies. The signs and symptoms of potential  delayed complications were discussed with the                            patient. Return to normal activities tomorrow.                            Written discharge instructions were provided to the                            patient.                           - Resume previous diet.                           - Continue present medications. Ladene Artist, MD 12/29/2015 2:57:29 PM This report has been signed electronically.

## 2015-12-29 NOTE — Telephone Encounter (Signed)
On call note. Pt underwent colonoscopy and EGD today. She notes very mild left shoulder blade area pain when she was going to bed tonight. No other symptoms at all and felt entirely fine until getting in bed. Pt reassured that this is unlikely to be a significant problem related to her procedures today. Advised Tylenol and rest. If  symptoms worsen or any additional symptoms develop advised to call back or go to ED for evaluation.

## 2015-12-29 NOTE — Progress Notes (Signed)
  [facility] Post-op Note  Patient: Victoria Hall  Procedure(s) Performed: colonoscopy and endoscopy  Patient Location: [facility] - Recovery Area  Anesthesia Type: Deep Sedation/Propofol  Level of Consciousness: awake, oriented and patient cooperative  Airway and Oxygen Therapy: Patient Spontanous Breathing  Post-op Pain: none  Post-op Assessment:  Post-op Vital signs reviewed, Patient's Cardiovascular Status Stable, Respiratory Function Stable, Patent Airway, No signs of Nausea or vomiting and Pain level controlled  Post-op Vital Signs: Reviewed and stable  Complications: No apparent anesthesia complications  Amedeo Detweiler E 2:56 PM

## 2015-12-30 ENCOUNTER — Telehealth: Payer: Self-pay

## 2015-12-30 NOTE — Telephone Encounter (Signed)
Patient unavailable this am for follow up call. Message left to return call for follow up questions and concerns. [facility]

## 2015-12-30 NOTE — Telephone Encounter (Signed)
  Follow up Call-  Call back number 12/29/2015  Post procedure Call Back phone  # [phone]  Permission to leave phone message Yes  Some recent data might be hidden     Patient questions:  Do you have a fever, pain , or abdominal swelling? No. Pain Score  0 *  Have you tolerated food without any problems? Yes.    Have you been able to return to your normal activities? Yes.    Do you have any questions about your discharge instructions: Diet   No. Medications  No. Follow up visit  No.  Do you have questions or concerns about your Care? No.  Actions: * If pain score is 4 or above: No action needed, pain <4.

## 2016-01-06 ENCOUNTER — Ambulatory Visit: Payer: BLUE CROSS/BLUE SHIELD | Admitting: Neurology

## 2016-01-06 DIAGNOSIS — M5145 Schmorl's nodes, thoracolumbar region: Secondary | ICD-10-CM | POA: Diagnosis not present

## 2016-01-06 DIAGNOSIS — N83202 Unspecified ovarian cyst, left side: Secondary | ICD-10-CM | POA: Diagnosis not present

## 2016-01-06 DIAGNOSIS — M5116 Intervertebral disc disorders with radiculopathy, lumbar region: Secondary | ICD-10-CM | POA: Diagnosis not present

## 2016-01-06 DIAGNOSIS — M5416 Radiculopathy, lumbar region: Secondary | ICD-10-CM | POA: Diagnosis not present

## 2016-01-06 DIAGNOSIS — M4696 Unspecified inflammatory spondylopathy, lumbar region: Secondary | ICD-10-CM | POA: Diagnosis not present

## 2016-01-06 DIAGNOSIS — M4725 Other spondylosis with radiculopathy, thoracolumbar region: Secondary | ICD-10-CM | POA: Diagnosis not present

## 2016-01-10 DIAGNOSIS — N83209 Unspecified ovarian cyst, unspecified side: Secondary | ICD-10-CM | POA: Diagnosis not present

## 2016-01-10 DIAGNOSIS — R1032 Left lower quadrant pain: Secondary | ICD-10-CM | POA: Diagnosis not present

## 2016-01-10 DIAGNOSIS — N939 Abnormal uterine and vaginal bleeding, unspecified: Secondary | ICD-10-CM | POA: Diagnosis not present

## 2016-01-10 DIAGNOSIS — N921 Excessive and frequent menstruation with irregular cycle: Secondary | ICD-10-CM | POA: Diagnosis not present

## 2016-01-10 DIAGNOSIS — N7011 Chronic salpingitis: Secondary | ICD-10-CM | POA: Diagnosis not present

## 2016-01-10 DIAGNOSIS — Z3202 Encounter for pregnancy test, result negative: Secondary | ICD-10-CM | POA: Diagnosis not present

## 2016-01-12 ENCOUNTER — Encounter: Payer: Self-pay | Admitting: Gastroenterology

## 2016-01-18 ENCOUNTER — Emergency Department (HOSPITAL_BASED_OUTPATIENT_CLINIC_OR_DEPARTMENT_OTHER)
Admission: EM | Admit: 2016-01-18 | Discharge: 2016-01-18 | Disposition: A | Discharge status: Home / Self Care | Payer: BLUE CROSS/BLUE SHIELD | Attending: Emergency Medicine | Admitting: Emergency Medicine

## 2016-01-18 ENCOUNTER — Telehealth: Payer: Self-pay | Admitting: Family Medicine

## 2016-01-18 ENCOUNTER — Encounter (HOSPITAL_BASED_OUTPATIENT_CLINIC_OR_DEPARTMENT_OTHER): Payer: Self-pay | Admitting: *Deleted

## 2016-01-18 DIAGNOSIS — R451 Restlessness and agitation: Secondary | ICD-10-CM | POA: Insufficient documentation

## 2016-01-18 DIAGNOSIS — T50905A Adverse effect of unspecified drugs, medicaments and biological substances, initial encounter: Secondary | ICD-10-CM

## 2016-01-18 DIAGNOSIS — Z87891 Personal history of nicotine dependence: Secondary | ICD-10-CM | POA: Insufficient documentation

## 2016-01-18 DIAGNOSIS — T50995A Adverse effect of other drugs, medicaments and biological substances, initial encounter: Secondary | ICD-10-CM | POA: Diagnosis not present

## 2016-01-18 DIAGNOSIS — R002 Palpitations: Secondary | ICD-10-CM | POA: Diagnosis not present

## 2016-01-18 DIAGNOSIS — R079 Chest pain, unspecified: Secondary | ICD-10-CM | POA: Diagnosis not present

## 2016-01-18 LAB — CBC WITH DIFFERENTIAL/PLATELET
Basophils Absolute: 0.1 10*3/uL (ref 0.0–0.1)
Basophils Relative: 1 %
Eosinophils Absolute: 0.3 10*3/uL (ref 0.0–0.7)
Eosinophils Relative: 3 %
HCT: 40.4 % (ref 36.0–46.0)
Hemoglobin: 14.1 g/dL (ref 12.0–15.0)
Lymphocytes Relative: 18 %
Lymphs Abs: 1.9 10*3/uL (ref 0.7–4.0)
MCH: 28.8 pg (ref 26.0–34.0)
MCHC: 34.9 g/dL (ref 30.0–36.0)
MCV: 82.6 fL (ref 78.0–100.0)
Monocytes Absolute: 0.5 10*3/uL (ref 0.1–1.0)
Monocytes Relative: 5 %
Neutro Abs: 7.6 10*3/uL (ref 1.7–7.7)
Neutrophils Relative %: 73 %
Platelets: 284 10*3/uL (ref 150–400)
RBC: 4.89 MIL/uL (ref 3.87–5.11)
RDW: 13.9 % (ref 11.5–15.5)
WBC: 10.4 10*3/uL (ref 4.0–10.5)

## 2016-01-18 LAB — COMPREHENSIVE METABOLIC PANEL
ALT: 25 U/L (ref 14–54)
AST: 23 U/L (ref 15–41)
Albumin: 4 g/dL (ref 3.5–5.0)
Alkaline Phosphatase: 48 U/L (ref 38–126)
Anion gap: 6 (ref 5–15)
BUN: 7 mg/dL (ref 6–20)
CO2: 27 mmol/L (ref 22–32)
Calcium: 9.2 mg/dL (ref 8.9–10.3)
Chloride: 103 mmol/L (ref 101–111)
Creatinine, Ser: 0.67 mg/dL (ref 0.44–1.00)
GFR calc Af Amer: >60 mL/min (ref >60)
GFR calc non Af Amer: >60 mL/min (ref >60)
Glucose, Bld: 94 mg/dL (ref 65–99)
Potassium: 3.9 mmol/L (ref 3.5–5.1)
Sodium: 136 mmol/L (ref 135–145)
Total Bilirubin: 0.4 mg/dL (ref 0.3–1.2)
Total Protein: 6.9 g/dL (ref 6.5–8.1)

## 2016-01-18 LAB — TROPONIN I: Troponin I: <0.03 ng/mL (ref <0.03)

## 2016-01-18 LAB — MAGNESIUM: Magnesium: 1.8 mg/dL (ref 1.7–2.4)

## 2016-01-18 MED ORDER — DIPHENHYDRAMINE HCL 25 MG PO CAPS
25.0000 mg | ORAL_CAPSULE | Freq: Once | ORAL | Status: AC
  Administered 2016-01-18: 25 mg via ORAL
  Filled 2016-01-18: qty 1

## 2016-01-18 MED ORDER — METHOCARBAMOL 500 MG PO TABS: 500.0000 mg | ORAL_TABLET | Freq: Four times a day (QID) | ORAL | 0 refills | Status: DC | PRN

## 2016-01-18 MED FILL — METHOCARBAMOL 500 MG TABLET: 500 | 3 days supply | Qty: 12 | Fill #0

## 2016-01-18 NOTE — Telephone Encounter (Signed)
Patient Name: Victoria Hall  DOB: 1970-12-10    Initial Comment Pt having arrhythmia, chest pain, feeling tired and pale temp falls to 96 - thinks the medication she is on is messing with her   Nurse Assessment  Nurse: Leilani Merl, RN, Heather Date/Time (Eastern Time): 01/18/2016 7:59:20 AM  Confirm and document reason for call. If symptomatic, describe symptoms. You must click the next button to save text entered. ---Pt having arrhythmia, chest pain, feeling tired and pale temp falls to 96 - thinks the medication she is on is messing with her, she was given cyclobenzaprine by her spinal doctor  Has the patient traveled out of the country within the last 30 days? ---Not Applicable  Does the patient have any new or worsening symptoms? ---Yes  Will a triage be completed? ---Yes  Related visit to physician within the last 2 weeks? ---No  Does the PT have any chronic conditions? (i.e. diabetes, asthma, etc.) ---Yes  List chronic conditions. ---See MR  Is the patient pregnant or possibly pregnant? (Ask all females between the ages of 50-55) ---No  Is this a behavioral health or substance abuse call? ---No     Guidelines    Guideline Title Affirmed Question Affirmed Notes  Heart Rate and Heartbeat Questions Patient sounds very sick or weak to the triager    Final Disposition User   Go to ED Now (or PCP triage) Leilani Merl, RN, Heather    Referrals  [facility] - ED   Disagree/Comply: Comply

## 2016-01-18 NOTE — Telephone Encounter (Signed)
Per chart review, pt currently at [facility] ED.

## 2016-01-18 NOTE — ED Triage Notes (Signed)
Patient states that she started taking flexeril 6 days ago for her back and two days ago she started experiencing restless legs and she felt as if her heart was skipping a beat. She had some chest pain on Saturday and she states it felt like someone was squeezing her chest that lasted around 15 minutes

## 2016-01-18 NOTE — ED Provider Notes (Signed)
[facility] Provider Note   CSN: [id number] Arrival date & time: 01/18/16  [id number]     History   Chief Complaint Chief Complaint  Patient presents with  . Irregular Heart Beat    HPI Victoria Hall is a 45 y.o. female.  HPI   Began flexeril 6 days ago, had great pain relief, however began to develop concern for side effects 3 days ago.  Saturday began to have muscle cramps in thighs and arms. Also felt cramp in chest, and then felt like having fluttering heart feeling, then began to feel sensation of wanting to move, restless legs, woke up at 5AM feeling restless.  Had taken in in the past but was also on it with hydrocodone and not sure if side effect was from hydrocodone or flexeril but felt lightheaded.   Called pharmacist Sunday after feeling restless then slept most of the day. Concerned it is disturbing REM sleep. Was given rx for robinul for GI symptoms by gastroenterologist, however has not been taking it.  Since colonoscopy has not had problems so did not take it.  Took one advil yesterday.  Not had reaction like this before.  On valium.   Hydrosalpinx left side for long time, seeing OBGYN for this.  CP felt like squeezing, 10-15 minutes while walking in [employer]. No chest pain since then. Felt dizzy that day. No hx of heart disease.  Past Medical History:  Diagnosis Date  . Anxiety   . Arthritis   . Cervical intraepithelial neoplasia grade 3 2002   s/p LEEP Rx repeat pap negative  . Fallopian tube disorder    right fallopian tube removed  . Gallstones 09/2015   On CT  . Hydrosalpinx    [facility]  . Interstitial cystitis   . Neurological abnormality    Neg workup with MRI/MRA in 2007 WNL except decreased caliber MCA proximally, distal cavernous portion of left LCA which may represent true stenosis or technique on exam. Evaluated by Dr Linus Salmons.  . Palpitations   . Partial seizures (HCC)    questionable diag  . Polycystic ovary    multiple ovarian cysts removed  . S/P cervical discectomy    Dr Vertell Limber, anterior discectomy C5-C7  . Seizures [facility])     Patient Active Problem List   Diagnosis Date Noted  . MUSCLE SPASM, BACK 06/08/2009  . HYDROSALPINX 02/07/2008  . HEADACHE 10/28/2007  . TACHYCARDIA 10/14/2007  . ANXIETY DISORDER 05/28/2007  . POLYARTHRITIS 05/09/2007  . UNSPECIFIED DISORDERS OF NERVOUS SYSTEM 04/22/2007  . ABDOMINAL PAIN RIGHT LOWER QUADRANT 04/08/2007  . PALPITATIONS, RECURRENT 11/07/2006  . PROBLEMS W/SMELL/TASTE 06/29/2006  . TOBACCO ABUSE 01/08/2006  . VISUAL IMPAIRMENT 01/08/2006  . DISTURBANCE, VISUAL NOS 01/08/2006  . FIBROCYSTIC BREAST DISEASE 01/08/2006  . [place] DISEASE, CERVICAL 01/08/2006  . VERTIGO 01/08/2006    Past Surgical History:  Procedure Laterality Date  . APPENDECTOMY  1986  . BUNIONECTOMY Left    bunion removal  . CERVICAL DISC SURGERY  2005   C5-C7  . fallopian tue removed    . LEEP  1998  . TONSILLECTOMY  2001    OB History    No data available       Home Medications    Prior to Admission medications   Medication Sig Start Date End Date Taking? Authorizing Provider  glycopyrrolate (ROBINUL) 2 MG tablet Take 1 tablet (2 mg total) by mouth 2 (two) times daily. 12/29/15 12/28/16  Ladene Artist, MD  methocarbamol (  ROBAXIN) 500 MG tablet Take 1 tablet (500 mg total) by mouth every 6 (six) hours as needed for muscle spasms. 01/18/16   Gareth Morgan, MD  Multiple Vitamins-Minerals (WOMENS MULTIVITAMIN PO) Take 1 tablet by mouth daily.    Historical Provider, MD    Family History Family History  Problem Relation Age of Onset  . Adopted: Yes    Social History Social History  Substance Use Topics  . Smoking status: Former Smoker    Packs/day: 0.50    Years: 10.00    Types: Cigarettes    Quit date: 11/02/2015  . Smokeless tobacco: Never Used  . Alcohol use 0.6 - 1.2 oz/week    1 - 2 Standard drinks or equivalent per week     Comment: occasionally 0-2  per day     Allergies   Codeine and Xanax [alprazolam]   Review of Systems Review of Systems  Constitutional: Negative for fever.  HENT: Negative for sore throat.   Eyes: Negative for visual disturbance.  Respiratory: Negative for cough and shortness of breath.   Cardiovascular: Positive for chest pain and palpitations. Negative for leg swelling.  Gastrointestinal: Negative for abdominal pain, nausea and vomiting.  Genitourinary: Negative for difficulty urinating.  Musculoskeletal: Negative for back pain and neck pain.  Skin: Negative for rash.  Neurological: Negative for syncope and headaches.  Psychiatric/Behavioral: The patient is nervous/anxious.      Physical Exam Updated Vital Signs BP 140/81 (BP Location: Left Arm)   Pulse 107   Temp 98.4 F (36.9 C) (Oral)   Resp 18   Ht 5\' 5"  (1.651 m)   Wt 173 lb (78.5 kg)   SpO2 97%   BMI 28.79 kg/m   Physical Exam  Constitutional: She is oriented to person, place, and time. She appears well-developed and well-nourished. No distress.  Appears restless, moving legs  HENT:  Head: Normocephalic and atraumatic.  Eyes: Conjunctivae and EOM are normal.  Neck: Normal range of motion.  Cardiovascular: Normal rate, regular rhythm, normal heart sounds and intact distal pulses.  Exam reveals no gallop and no friction rub.   No murmur heard. Pulmonary/Chest: Effort normal and breath sounds normal. No respiratory distress. She has no wheezes. She has no rales.  Abdominal: Soft. She exhibits no distension. There is no tenderness. There is no guarding.  Musculoskeletal: She exhibits no edema or tenderness.  Neurological: She is alert and oriented to person, place, and time. She has normal strength. She displays no tremor.  Reflex Scores:      Bicep reflexes are 0 on the right side and 2+ on the left side.      Patellar reflexes are 2+ on the right side and 2+ on the left side. Skin: Skin is warm and dry. No rash noted. She is not  diaphoretic. No erythema.  Nursing note and vitals reviewed.    ED Treatments / Results  Labs (all labs ordered are listed, but only abnormal results are displayed) Labs Reviewed  CBC WITH DIFFERENTIAL/PLATELET  COMPREHENSIVE METABOLIC PANEL  MAGNESIUM  TROPONIN I    EKG  EKG Interpretation  Date/Time:  Tuesday January 18 2016 08:52:25 EDT Ventricular Rate:  80 PR Interval:    QRS Duration: 107 QT Interval:  383 QTC Calculation: 442 R Axis:   47 Text Interpretation:  Sinus rhythm Low voltage, precordial leads RSR' in V1 or V2, right VCD or RVH Borderline T abnormalities, anterior leads Baseline wander in lead(s) V3 No significant change since last tracing Confirmed by  [facility] MD, Junie Panning ([id number]) on 01/18/2016 9:06:26 AM       Radiology No results found.  Procedures Procedures (including critical care time)  Medications Ordered in ED Medications  diphenhydrAMINE (BENADRYL) capsule 25 mg (25 mg Oral Given 01/18/16 1012)     Initial Impression / Assessment and Plan / ED Course  I have reviewed the triage vital signs and the nursing notes.  Pertinent labs & imaging results that were available during my care of the patient were reviewed by me and considered in my medical decision making (see chart for details).  Clinical Course   45 year old female with a history of anxiety, cervical disc disease, lumbar disease, cervical intraepithelial neoplasia grade 3, ovarian cyst, hydrosalpinx, who presents with concern of sensation of palpitations, restlessness after starting Flexeril 6 days ago. Patient also reports 1 episode of chest pain that happened 3 days ago. EKG shows no sign of arrhythmia, no acute ST changes. Troponin was checked and negative. Patient low risk for cardiac disease, is perc negative, no dyspnea, no current CP, doubt acute PE/dissection/ACS/pneumothorax.  Reports palpitations, was monitored on telemetry without signs of arrhythmia.  Patient denies taking  other drugs, other over-the-counter medications or supplements, and as well as other prescription medications. Recently had TSH checked by PCP.  Labs show no significant electrolyte abnormalities, no anemia. Patient denies possibility of pregnancy, reports she also had negative pregnancy test last [facility].  Primary concern is sensation of restlessness, wanting to move about, wanting to kick legs, consistent with akathisia or Restless Legs Syndrome. Given timing of patient's starting Flexeril and these symptoms starting, feel side effect of Flexeril is possible. We will change patient's prescription from Flexeril to Robaxin. Gave Benadryl to help with restless sensation. Recommend continued PCP follow up.  Final Clinical Impressions(s) / ED Diagnoses   Final diagnoses:  Palpitations  Restlessness  Adverse effect of drug, initial encounter, suspected    New Prescriptions New Prescriptions   METHOCARBAMOL (ROBAXIN) 500 MG TABLET    Take 1 tablet (500 mg total) by mouth every 6 (six) hours as needed for muscle spasms.     Gareth Morgan, MD 01/18/16 1057

## 2016-01-26 DIAGNOSIS — N7011 Chronic salpingitis: Secondary | ICD-10-CM | POA: Diagnosis not present

## 2016-01-31 ENCOUNTER — Encounter: Payer: Self-pay | Admitting: Family Medicine

## 2016-02-02 ENCOUNTER — Ambulatory Visit (INDEPENDENT_AMBULATORY_CARE_PROVIDER_SITE_OTHER): Payer: BLUE CROSS/BLUE SHIELD | Admitting: Family Medicine

## 2016-02-02 VITALS — BP 120/83 | HR 77 | Ht 65.0 in | Wt 170.0 lb

## 2016-02-02 DIAGNOSIS — M25551 Pain in right hip: Secondary | ICD-10-CM

## 2016-02-02 MED ORDER — METHYLPREDNISOLONE ACETATE 40 MG/ML IJ SUSP
40.0000 mg | Freq: Once | INTRAMUSCULAR | Status: AC
  Administered 2016-02-02: 40 mg via INTRA_ARTICULAR

## 2016-02-02 NOTE — Patient Instructions (Signed)
You have trochanteric bursitis, hip external rotator spasms/strain.   It's possible you have a labral tear of your hip but this is also treated conservatively initially. Avoid painful activities as much as possible. Ice over area of pain 3-4 times a day for 15 minutes at a time Standing hip rotations, hip side raise exercises 3 sets of 10 once a day - add weights if this becomes too easy. Stretches - pick 2 and hold for 20-30 seconds x 3 - do once or twice a day. Tylenol and/or aleve as needed for pain (follow the preop instructions on Friday regarding what you can/can't take prior to surgery though). You were given a cortisone injection today. Consider physical therapy if not improving as expected. Follow up with me in 1 month to 6 weeks.

## 2016-02-03 ENCOUNTER — Encounter: Payer: Self-pay | Admitting: Family Medicine

## 2016-02-03 DIAGNOSIS — M25551 Pain in right hip: Secondary | ICD-10-CM | POA: Insufficient documentation

## 2016-02-03 NOTE — Assessment & Plan Note (Signed)
consistent with combination of trochanteric bursitis, hip external rotator strain/spasms.  We discussed possibility of labral tear - independently reviewed radiographs and no evidence arthritis, other abnormalities.  Would recommend conservative management for these.  Icing, shown home exercises and stretches to do daily.  Tylenol or aleve as needed for pain.  Bursa injection given.  F/u in 1 month to 6 weeks.  Consider PT if not improving as expected.  After informed written consent patient was lying on exam table on left side.  Area overlying right hip greater trochanter prepped with alcohol swab then injected with 6:2 marcaine: depomedrol.  Patient tolerated procedure well without immediate complications.

## 2016-02-03 NOTE — Progress Notes (Signed)
PCP and consultation requested by: Lamar Blinks, MD  Subjective:   HPI: Patient is a 45 y.o. female here for right hip pain.  Patient reports she's had problems with right hip pain past 6 months. Pain worse with walking, sitting, standing a long time. Pain was intermittent initially but more constant these 6 months. Pain is lateral, anterior, and posterior. Difficulty getting comfortable. Cannot lie on right side. Feels grinding/popping. Tried advil, flexeril. Pain 3/10 at rest, up to 7/10 and sharp at worst. No skin changes, numbness. No bowel/bladder dysfunction.  Past Medical History:  Diagnosis Date  . Anxiety   . Arthritis   . Cervical intraepithelial neoplasia grade 3 2002   s/p LEEP Rx repeat pap negative  . Fallopian tube disorder    right fallopian tube removed  . Gallstones 09/2015   On CT  . Hydrosalpinx    [facility]  . Interstitial cystitis   . Neurological abnormality    Neg workup with MRI/MRA in 2007 WNL except decreased caliber MCA proximally, distal cavernous portion of left LCA which may represent true stenosis or technique on exam. Evaluated by Dr Linus Salmons.  . Palpitations   . Partial seizures (HCC)    questionable diag  . Polycystic ovary    multiple ovarian cysts removed  . S/P cervical discectomy    Dr Vertell Limber, anterior discectomy C5-C7  . Seizures ([place])     Current Outpatient Prescriptions on File Prior to Visit  Medication Sig Dispense Refill  . glycopyrrolate (ROBINUL) 2 MG tablet Take 1 tablet (2 mg total) by mouth 2 (two) times daily. (Patient not taking: Reported on 01/26/2016) 60 tablet 11  . ibuprofen (ADVIL,MOTRIN) 200 MG tablet Take 200 mg by mouth every 6 (six) hours as needed for moderate pain.    . methocarbamol (ROBAXIN) 500 MG tablet Take 1 tablet (500 mg total) by mouth every 6 (six) hours as needed for muscle spasms. (Patient not taking: Reported on 01/26/2016) 12 tablet 0  . Multiple Vitamins-Minerals (WOMENS  MULTIVITAMIN PO) Take 1 tablet by mouth daily.     Current Facility-Administered Medications on File Prior to Visit  Medication Dose Route Frequency Provider Last Rate Last Dose  . 0.9 %  sodium chloride infusion  500 mL Intravenous Continuous Ladene Artist, MD        Past Surgical History:  Procedure Laterality Date  . APPENDECTOMY  1986  . BUNIONECTOMY Left    bunion removal  . CERVICAL DISC SURGERY  2005   C5-C7  . fallopian tue removed    . LEEP  1998  . TONSILLECTOMY  2001    Allergies  Allergen Reactions  . Codeine Nausea And Vomiting  . Xanax [Alprazolam]     Bruising    Social History   Social History  . Marital status: Divorced    Spouse name: N/A  . Number of children: 0  . 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 Smoking status: Former Smoker    Packs/day: 0.50    Years: 10.00    Types: Cigarettes    Quit date: 11/02/2015  . Smokeless tobacco: Never Used  . Alcohol use 0.6 - 1.2 oz/week    1 - 2 Standard drinks or equivalent per week     Comment: occasionally 0-2 per day  . Drug use: No  .  Sexual activity: Yes   Other Topics Concern  . Not on file   Social History Narrative  . No narrative on file    Family History  Problem Relation Age of Onset  . Adopted: Yes    BP 120/83   Pulse 77   Ht 5\' 5"  (1.651 m)   Wt 170 lb (77.1 kg)   BMI 28.29 kg/m   Review of Systems: See HPI above.    Objective:  Physical Exam:  Gen: NAD, comfortable in exam room  Back/right hip: No gross deformity, scoliosis. TTP right buttock, piriformis muscles, greater trochanter.  No midline or other bony TTP. FROM back without pain.  FROM hip with pain on internal and external rotation. Strength LEs 5/5 all muscle groups including hip abduction.   2+ MSRs in patellar and achilles tendons, equal bilaterally. Negative  SLRs. Sensation intact to light touch bilaterally. Mild positive logroll right hip, negative left Negative fabers and piriformis stretches (pain lateral hip, anteriorly).    Assessment & Plan:  1. Right hip pain - consistent with combination of trochanteric bursitis, hip external rotator strain/spasms.  We discussed possibility of labral tear - independently reviewed radiographs and no evidence arthritis, other abnormalities.  Would recommend conservative management for these.  Icing, shown home exercises and stretches to do daily.  Tylenol or aleve as needed for pain.  Bursa injection given.  F/u in 1 month to 6 weeks.  Consider PT if not improving as expected.  After informed written consent patient was lying on exam table on left side.  Area overlying right hip greater trochanter prepped with alcohol swab then injected with 6:2 marcaine: depomedrol.  Patient tolerated procedure well without immediate complications.

## 2016-02-03 NOTE — Patient Instructions (Signed)
Your procedure is scheduled on:  Wednesday, Nov. 8, 2017  Enter through the [employer] [facility] at:  7:00 AM  Pick up the phone at the desk and dial [phone].  Call this number if you have problems the morning of surgery: [phone].  Remember: Do NOT eat food or drink after:  Midnight Tuesday  Take these medicines the morning of surgery with a SIP OF WATER:  None  Stop ALL herbal medications at this time   Do NOT wear jewelry (body piercing), metal hair clips/bobby pins, make-up, or nail polish. Do NOT wear lotions, powders, or perfumes.  You may wear deodorant. Do NOT shave for 48 hours prior to surgery. Do NOT bring valuables to the hospital. Contacts, dentures, or bridgework may not be worn into surgery.  Have a responsible adult drive you home and stay with you for 24 hours after your procedure

## 2016-02-04 ENCOUNTER — Encounter (HOSPITAL_COMMUNITY)
Admission: RE | Admit: 2016-02-04 | Discharge: 2016-02-04 | Disposition: A | Discharge status: Home / Self Care | Payer: BLUE CROSS/BLUE SHIELD | Source: Ambulatory Visit | Attending: Obstetrics and Gynecology | Admitting: Obstetrics and Gynecology

## 2016-02-04 ENCOUNTER — Encounter (HOSPITAL_COMMUNITY): Payer: Self-pay

## 2016-02-04 DIAGNOSIS — Z01818 Encounter for other preprocedural examination: Secondary | ICD-10-CM | POA: Insufficient documentation

## 2016-02-04 HISTORY — DX: Headache, unspecified: R51.9

## 2016-02-04 HISTORY — DX: Personal history of systemic steroid therapy: Z92.241

## 2016-02-04 HISTORY — DX: Radiculopathy, cervical region: M54.12

## 2016-02-04 HISTORY — DX: Pneumonia, unspecified organism: J18.9

## 2016-02-04 HISTORY — DX: Headache: R51

## 2016-02-04 LAB — CBC
HCT: 38.6 % (ref 36.0–46.0)
Hemoglobin: 13.5 g/dL (ref 12.0–15.0)
MCH: 29 pg (ref 26.0–34.0)
MCHC: 35 g/dL (ref 30.0–36.0)
MCV: 83 fL (ref 78.0–100.0)
Platelets: 321 10*3/uL (ref 150–400)
RBC: 4.65 MIL/uL (ref 3.87–5.11)
RDW: 13.8 % (ref 11.5–15.5)
WBC: 11.6 10*3/uL — ABNORMAL HIGH (ref 4.0–10.5)

## 2016-02-07 ENCOUNTER — Telehealth: Payer: Self-pay | Admitting: Family Medicine

## 2016-02-07 NOTE — Telephone Encounter (Signed)
Order placed for PT downstairs. 

## 2016-02-07 NOTE — Telephone Encounter (Signed)
Sounds good, thanks.

## 2016-02-07 NOTE — Telephone Encounter (Signed)
The exercises are important - if she is struggling to do them I would recommend considering physical therapy - they can safely ease her into doing them and have other exercises she could do in a modified fashion until she gets to that point.

## 2016-02-07 NOTE — Addendum Note (Signed)
Addended by: Sherrie George F on: 02/07/2016 01:09 PM   Modules accepted: Orders

## 2016-02-07 NOTE — Telephone Encounter (Signed)
Spoke to patient and she would like to do PT downstairs. She can start PT in 2 weeks.

## 2016-02-08 ENCOUNTER — Encounter (HOSPITAL_COMMUNITY): Payer: Self-pay | Admitting: Obstetrics and Gynecology

## 2016-02-08 DIAGNOSIS — N83209 Unspecified ovarian cyst, unspecified side: Secondary | ICD-10-CM

## 2016-02-08 DIAGNOSIS — N7011 Chronic salpingitis: Secondary | ICD-10-CM | POA: Diagnosis present

## 2016-02-08 HISTORY — DX: Chronic salpingitis: N70.11

## 2016-02-08 HISTORY — DX: Unspecified ovarian cyst, unspecified side: N83.209

## 2016-02-08 NOTE — H&P (Signed)
Victoria Hall is an 45 y.o. female G45 with L sided pain, known hydrosapinx and 2x3.5 cm, likely hemorrhagic, ovarian cyst.  Previously had R hydrosalpinx.  D/W pt r/b/a of L/S removal, pt requests LSO.    Pertinent Gynecological History: + abn pap, LEEP - last WNL +GC, PID H/o R salpingectomy  Menstrual History:  Patient's last menstrual period was 02/02/2016 (exact date).    Past Medical History:  Diagnosis Date  . Anxiety   . Arthritis   . Cervical intraepithelial neoplasia grade 3 2002   s/p LEEP Rx repeat pap negative  . Cervical radiculopathy   . Fallopian tube disorder    right fallopian tube removed  . Gallstones 09/2015   On CT  . Headache    Migraines  . Hemorrhagic ovarian cyst 02/08/2016  . Hydrosalpinx    [facility]  . Hydrosalpinx 02/08/2016  . Interstitial cystitis   . Neurological abnormality    Neg workup with MRI/MRA in 2007 WNL except decreased caliber MCA proximally, distal cavernous portion of left LCA which may represent true stenosis or technique on exam. Evaluated by Dr Linus Salmons.  . Palpitations   . Partial seizures (HCC)    questionable diag  . Pneumonia    walking pneumonia  . Polycystic ovary    multiple ovarian cysts removed  . S/P cervical discectomy    Dr Vertell Limber, anterior discectomy C5-C7  . S/P epidural steroid injection    Right hip  . Seizures ([place]) 2005   simple partial seizure disorder    Past Surgical History:  Procedure Laterality Date  . APPENDECTOMY  1986  . BUNIONECTOMY Left    bunion removal  . CERVICAL DISC SURGERY  2005   C5-C7  . COLONOSCOPY    . DENTAL SURGERY    . fallopian tue removed    . LEEP  1998  . TONSILLECTOMY  2001  . UPPER GI ENDOSCOPY      Family History  Problem Relation Age of Onset  . Adopted: Yes    Social History:  reports that she quit smoking about 3 months ago. Her smoking use included Cigarettes. She has a 5.00 pack-year smoking history. She has never used smokeless  tobacco. She reports that she drinks about 0.6 - 1.2 oz of alcohol per week . She reports that she does not use drugs. engaged.    Allergies:  Allergies  Allergen Reactions  . Codeine Nausea And Vomiting  . Cyclobenzaprine Other (See Comments)    Fast heartbeat, restless leg, nightmares  . Xanax [Alprazolam]     Bruising    No prescriptions prior to admission.    Review of Systems  Constitutional: Negative.   HENT: Negative.   Eyes: Negative.   Respiratory: Negative.   Cardiovascular: Negative.   Gastrointestinal: Positive for abdominal pain.  Genitourinary: Negative.   Musculoskeletal: Negative.   Skin: Negative.   Neurological: Negative.   Psychiatric/Behavioral: Negative.     Last menstrual period 02/02/2016. Physical Exam  Constitutional: She is oriented to person, place, and time. She appears well-developed and well-nourished.  HENT:  Head: Normocephalic and atraumatic.  Cardiovascular: Normal rate and regular rhythm.   Respiratory: Breath sounds normal. No respiratory distress. She has no wheezes.  GI: Soft. Bowel sounds are normal. She exhibits no distension. There is no tenderness.  Pelvic pain - L sided  Musculoskeletal: Normal range of motion.  Neurological: She is alert and oriented to person, place, and time.  Skin: Skin is warm and dry.  Psychiatric: She has a normal mood and affect. Her behavior is normal.    No results found for this or any previous visit (from the past 24 hour(s)).  [place] - L sided hydrosalpinx, L hemorraghic ovarian cyst 2x 3.5cm  Assessment/Plan: Admit for L/S LSO - d/w pt r/b/a No prophylaxis  Bovard-Stuckert, Jerome Otter 02/08/2016, 12:07 PM

## 2016-02-09 ENCOUNTER — Encounter (HOSPITAL_COMMUNITY): Payer: Self-pay | Admitting: Anesthesiology

## 2016-02-09 ENCOUNTER — Ambulatory Visit (HOSPITAL_COMMUNITY)
Admission: RE | Admit: 2016-02-09 | Discharge: 2016-02-09 | Disposition: A | Discharge status: Home / Self Care | Payer: BLUE CROSS/BLUE SHIELD | Source: Ambulatory Visit | Attending: Obstetrics and Gynecology | Admitting: Obstetrics and Gynecology

## 2016-02-09 ENCOUNTER — Ambulatory Visit (HOSPITAL_COMMUNITY): Payer: BLUE CROSS/BLUE SHIELD | Admitting: Anesthesiology

## 2016-02-09 ENCOUNTER — Encounter (HOSPITAL_COMMUNITY)
Admission: RE | Disposition: A | Discharge status: Home / Self Care | Payer: Self-pay | Source: Ambulatory Visit | Attending: Obstetrics and Gynecology

## 2016-02-09 DIAGNOSIS — N83201 Unspecified ovarian cyst, right side: Secondary | ICD-10-CM | POA: Diagnosis not present

## 2016-02-09 DIAGNOSIS — N8301 Follicular cyst of right ovary: Secondary | ICD-10-CM | POA: Diagnosis not present

## 2016-02-09 DIAGNOSIS — N83202 Unspecified ovarian cyst, left side: Secondary | ICD-10-CM | POA: Diagnosis not present

## 2016-02-09 DIAGNOSIS — N7011 Chronic salpingitis: Secondary | ICD-10-CM | POA: Diagnosis not present

## 2016-02-09 DIAGNOSIS — Z87891 Personal history of nicotine dependence: Secondary | ICD-10-CM | POA: Diagnosis not present

## 2016-02-09 DIAGNOSIS — N736 Female pelvic peritoneal adhesions (postinfective): Secondary | ICD-10-CM | POA: Diagnosis not present

## 2016-02-09 DIAGNOSIS — R1032 Left lower quadrant pain: Secondary | ICD-10-CM | POA: Diagnosis not present

## 2016-02-09 DIAGNOSIS — N83209 Unspecified ovarian cyst, unspecified side: Secondary | ICD-10-CM | POA: Diagnosis present

## 2016-02-09 HISTORY — PX: OOPHORECTOMY: SHX6387

## 2016-02-09 HISTORY — DX: Unspecified ovarian cyst, unspecified side: N83.209

## 2016-02-09 HISTORY — PX: LAPAROSCOPIC BILATERAL SALPINGECTOMY: SHX5889

## 2016-02-09 HISTORY — PX: LAPAROSCOPIC LYSIS OF ADHESIONS: SHX5905

## 2016-02-09 HISTORY — PX: LAPAROSCOPIC OVARIAN CYSTECTOMY: SHX6248

## 2016-02-09 LAB — PREGNANCY, URINE: Preg Test, Ur: NEGATIVE

## 2016-02-09 SURGERY — SALPINGECTOMY, BILATERAL, LAPAROSCOPIC
Anesthesia: General | Laterality: Right

## 2016-02-09 MED ORDER — KETOROLAC TROMETHAMINE 30 MG/ML IJ SOLN
INTRAMUSCULAR | Status: DC | PRN
  Administered 2016-02-09: 30 mg via INTRAVENOUS

## 2016-02-09 MED ORDER — NEOSTIGMINE METHYLSULFATE 10 MG/10ML IV SOLN
INTRAVENOUS | Status: AC
  Filled 2016-02-09: qty 1

## 2016-02-09 MED ORDER — HYDROCODONE-ACETAMINOPHEN 5-325 MG PO TABS: 1.0000 | ORAL_TABLET | Freq: Four times a day (QID) | ORAL | 0 refills | Status: DC | PRN

## 2016-02-09 MED ORDER — ONDANSETRON HCL 4 MG/2ML IJ SOLN
INTRAMUSCULAR | Status: DC | PRN
  Administered 2016-02-09: 4 mg via INTRAVENOUS

## 2016-02-09 MED ORDER — ONDANSETRON HCL 4 MG/2ML IJ SOLN
4.0000 mg | Freq: Once | INTRAMUSCULAR | Status: AC | PRN
Start: 2016-02-09 — End: 2016-02-09
  Administered 2016-02-09: 4 mg via INTRAVENOUS

## 2016-02-09 MED ORDER — PROPOFOL 10 MG/ML IV BOLUS
INTRAVENOUS | Status: DC | PRN
Start: 2016-02-09 — End: 2016-02-09
  Administered 2016-02-09: 200 mg via INTRAVENOUS

## 2016-02-09 MED ORDER — MEPERIDINE HCL 25 MG/ML IJ SOLN: 6.2500 mg | INTRAMUSCULAR | Status: DC | PRN

## 2016-02-09 MED ORDER — FENTANYL CITRATE (PF) 100 MCG/2ML IJ SOLN
INTRAMUSCULAR | Status: DC | PRN
  Administered 2016-02-09 (×3): 50 ug via INTRAVENOUS
  Administered 2016-02-09: 100 ug via INTRAVENOUS

## 2016-02-09 MED ORDER — PROMETHAZINE HCL 25 MG/ML IJ SOLN
6.2500 mg | Freq: Once | INTRAMUSCULAR | Status: AC
  Administered 2016-02-09: 6.25 mg via INTRAVENOUS

## 2016-02-09 MED ORDER — IBUPROFEN 800 MG PO TABS: 800.0000 mg | ORAL_TABLET | Freq: Three times a day (TID) | ORAL | 1 refills | Status: DC | PRN

## 2016-02-09 MED ORDER — DEXAMETHASONE SODIUM PHOSPHATE 4 MG/ML IJ SOLN
INTRAMUSCULAR | Status: AC
  Filled 2016-02-09: qty 1

## 2016-02-09 MED ORDER — LIDOCAINE HCL (CARDIAC) 20 MG/ML IV SOLN
INTRAVENOUS | Status: DC | PRN
  Administered 2016-02-09: 80 mg via INTRAVENOUS
  Administered 2016-02-09: 20 mg via INTRAVENOUS

## 2016-02-09 MED ORDER — ONDANSETRON HCL 4 MG/2ML IJ SOLN
INTRAMUSCULAR | Status: AC
  Filled 2016-02-09: qty 2

## 2016-02-09 MED ORDER — KETOROLAC TROMETHAMINE 30 MG/ML IJ SOLN
INTRAMUSCULAR | Status: AC
  Filled 2016-02-09: qty 1

## 2016-02-09 MED ORDER — MIDAZOLAM HCL 2 MG/2ML IJ SOLN
INTRAMUSCULAR | Status: DC | PRN
Start: 2016-02-09 — End: 2016-02-09
  Administered 2016-02-09: 2 mg via INTRAVENOUS

## 2016-02-09 MED ORDER — PROMETHAZINE HCL 25 MG/ML IJ SOLN
INTRAMUSCULAR | Status: AC
  Filled 2016-02-09: qty 1

## 2016-02-09 MED ORDER — PROPOFOL 10 MG/ML IV BOLUS
INTRAVENOUS | Status: AC
  Filled 2016-02-09: qty 20

## 2016-02-09 MED ORDER — DEXAMETHASONE SODIUM PHOSPHATE 4 MG/ML IJ SOLN
INTRAMUSCULAR | Status: DC | PRN
  Administered 2016-02-09: 4 mg via INTRAVENOUS

## 2016-02-09 MED ORDER — NEOSTIGMINE METHYLSULFATE 10 MG/10ML IV SOLN
INTRAVENOUS | Status: DC | PRN
  Administered 2016-02-09: 3 mg via INTRAVENOUS

## 2016-02-09 MED ORDER — LIDOCAINE HCL (CARDIAC) 20 MG/ML IV SOLN
INTRAVENOUS | Status: AC
  Filled 2016-02-09: qty 5

## 2016-02-09 MED ORDER — SCOPOLAMINE 1 MG/3DAYS TD PT72
MEDICATED_PATCH | TRANSDERMAL | Status: AC
  Administered 2016-02-09: 1.5 mg via TRANSDERMAL
  Filled 2016-02-09: qty 1

## 2016-02-09 MED ORDER — GLYCOPYRROLATE 0.2 MG/ML IJ SOLN
INTRAMUSCULAR | Status: DC | PRN
  Administered 2016-02-09: .6 mg via INTRAVENOUS

## 2016-02-09 MED ORDER — GLYCOPYRROLATE 0.2 MG/ML IJ SOLN
INTRAMUSCULAR | Status: AC
  Filled 2016-02-09: qty 3

## 2016-02-09 MED ORDER — SCOPOLAMINE 1 MG/3DAYS TD PT72
1.0000 | MEDICATED_PATCH | Freq: Once | TRANSDERMAL | Status: DC
  Administered 2016-02-09: 1.5 mg via TRANSDERMAL

## 2016-02-09 MED ORDER — FENTANYL CITRATE (PF) 250 MCG/5ML IJ SOLN
INTRAMUSCULAR | Status: AC
  Filled 2016-02-09: qty 5

## 2016-02-09 MED ORDER — MIDAZOLAM HCL 2 MG/2ML IJ SOLN
INTRAMUSCULAR | Status: AC
  Filled 2016-02-09: qty 2

## 2016-02-09 MED ORDER — ROCURONIUM BROMIDE 100 MG/10ML IV SOLN
INTRAVENOUS | Status: AC
  Filled 2016-02-09: qty 1

## 2016-02-09 MED ORDER — BUPIVACAINE HCL (PF) 0.25 % IJ SOLN
INTRAMUSCULAR | Status: AC
  Filled 2016-02-09: qty 30

## 2016-02-09 MED ORDER — ROCURONIUM BROMIDE 100 MG/10ML IV SOLN
INTRAVENOUS | Status: DC | PRN
  Administered 2016-02-09: 50 mg via INTRAVENOUS
  Administered 2016-02-09: 10 mg via INTRAVENOUS

## 2016-02-09 MED ORDER — LACTATED RINGERS IV SOLN
INTRAVENOUS | Status: DC
  Administered 2016-02-09 (×4): via INTRAVENOUS

## 2016-02-09 MED ORDER — BUPIVACAINE HCL (PF) 0.25 % IJ SOLN
INTRAMUSCULAR | Status: DC | PRN
  Administered 2016-02-09 (×2): 10 mL
  Administered 2016-02-09: 5 mL

## 2016-02-09 MED ORDER — HYDROMORPHONE HCL 1 MG/ML IJ SOLN
0.2500 mg | INTRAMUSCULAR | Status: DC | PRN
  Administered 2016-02-09: 0.5 mg via INTRAVENOUS

## 2016-02-09 MED ORDER — LACTATED RINGERS IV SOLN: INTRAVENOUS | Status: DC

## 2016-02-09 MED ORDER — HYDROMORPHONE HCL 1 MG/ML IJ SOLN
INTRAMUSCULAR | Status: DC
Start: 2016-02-09 — End: 2016-02-09
  Filled 2016-02-09: qty 1

## 2016-02-09 SURGICAL SUPPLY — 34 items
BAG SPEC RTRVL LRG 6X4 10 (ENDOMECHANICALS) ×3
CABLE HIGH FREQUENCY MONO STRZ (ELECTRODE) IMPLANT
CATH ROBINSON RED A/P 16FR (CATHETERS) ×4 IMPLANT
CLOTH BEACON ORANGE TIMEOUT ST (SAFETY) ×4 IMPLANT
DRSG OPSITE POSTOP 3X4 (GAUZE/BANDAGES/DRESSINGS) ×1 IMPLANT
DURAPREP 26ML APPLICATOR (WOUND CARE) ×4 IMPLANT
FILTER SMOKE EVACUATOR (FILTER) ×1 IMPLANT
GLOVE BIO SURGEON STRL SZ 6.5 (GLOVE) ×4 IMPLANT
GLOVE BIOGEL PI IND STRL 7.0 (GLOVE) ×3 IMPLANT
GLOVE BIOGEL PI INDICATOR 7.0 (GLOVE) ×1
GOWN STRL REUS W/TWL LRG LVL3 (GOWN DISPOSABLE) ×8 IMPLANT
LIQUID BAND (GAUZE/BANDAGES/DRESSINGS) ×4 IMPLANT
NEEDLE INSUFFLATION 120MM (ENDOMECHANICALS) ×3 IMPLANT
NS IRRIG 1000ML POUR BTL (IV SOLUTION) ×4 IMPLANT
PACK LAPAROSCOPY BASIN (CUSTOM PROCEDURE TRAY) ×4 IMPLANT
PACK TRENDGUARD 450 HYBRID PRO (MISCELLANEOUS) IMPLANT
PACK TRENDGUARD 600 HYBRD PROC (MISCELLANEOUS) IMPLANT
POUCH SPECIMEN RETRIEVAL 10MM (ENDOMECHANICALS) ×1 IMPLANT
PROTECTOR NERVE ULNAR (MISCELLANEOUS) ×7 IMPLANT
SET IRRIG TUBING LAPAROSCOPIC (IRRIGATION / IRRIGATOR) IMPLANT
SHEARS HARMONIC ACE PLUS 36CM (ENDOMECHANICALS) ×1 IMPLANT
SLEEVE XCEL OPT CAN 5 100 (ENDOMECHANICALS) ×4 IMPLANT
SUT VICRYL 0 UR6 27IN ABS (SUTURE) ×1 IMPLANT
SUT VICRYL 4-0 PS2 18IN ABS (SUTURE) ×5 IMPLANT
SYRINGE 60CC LL (MISCELLANEOUS) ×1 IMPLANT
TOWEL OR 17X24 6PK STRL BLUE (TOWEL DISPOSABLE) ×8 IMPLANT
TRAY FOLEY CATH SILVER 16FR (SET/KITS/TRAYS/PACK) ×1 IMPLANT
TRENDGUARD 450 HYBRID PRO PACK (MISCELLANEOUS) ×4
TRENDGUARD 600 HYBRID PROC PK (MISCELLANEOUS)
TROCAR BALLN 12MMX100 BLUNT (TROCAR) ×1 IMPLANT
TROCAR XCEL NON-BLD 11X100MML (ENDOMECHANICALS) IMPLANT
TROCAR XCEL NON-BLD 5MMX100MML (ENDOMECHANICALS) ×4 IMPLANT
WARMER LAPAROSCOPE (MISCELLANEOUS) ×4 IMPLANT
WATER STERILE IRR 1000ML POUR (IV SOLUTION) ×4 IMPLANT

## 2016-02-09 NOTE — Anesthesia Postprocedure Evaluation (Signed)
Anesthesia Post Note  Patient: Victoria Hall  Procedure(s) Performed: Procedure(s) (LRB): LAPAROSCOPIC LEFT  SALPINGECTOMY (Left) LEFT OOPHORECTOMY (Left) LAPAROSCOPIC LYSIS OF ADHESIONS LAPAROSCOPIC OVARIAN CYSTECTOMY (Right)  Patient location during evaluation: PACU Anesthesia Type: General Level of consciousness: awake and alert Pain management: pain level controlled Vital Signs Assessment: post-procedure vital signs reviewed and stable Respiratory status: spontaneous breathing, nonlabored ventilation, respiratory function stable and patient connected to nasal cannula oxygen Cardiovascular status: blood pressure returned to baseline and stable Postop Assessment: no signs of nausea or vomiting Anesthetic complications: no     Last Vitals:  Vitals:   02/09/16 1245 02/09/16 1315  BP:  108/62  Pulse:  66  Resp:  16  Temp: 36.9 C 36.5 C    Last Pain:  Vitals:   02/09/16 1315  TempSrc:   PainSc: 3    Pain Goal: Patients Stated Pain Goal: 4 (02/09/16 1315)               Jane Birkel DAVID

## 2016-02-09 NOTE — Discharge Instructions (Signed)
DISCHARGE INSTRUCTIONS: Laparoscopy  The following instructions have been prepared to help you care for yourself upon your return home today.  Wound care:  Do not get the incision wet for the first 24 hours. The incision should be kept clean and dry.  The Band-Aids or dressings may be removed the day after surgery.  Should the incision become sore, red, and swollen after the first week, check with your doctor.  Personal hygiene:  Shower the day after your procedure.  Activity and limitations:  Do NOT drive or operate any equipment today.  Do NOT lift anything more than 10-15 pounds for 2-3 weeks after surgery.  Do NOT rest in bed all day.  Walking is encouraged. Walk each day, starting slowly with 5-minute walks 3 or 4 times a day. Slowly increase the length of your walks.  Walk up and down stairs slowly.  Do NOT do strenuous activities, such as golfing, playing tennis, bowling, running, biking, weight lifting, gardening, mowing, or vacuuming for 2-4 weeks. Ask your doctor when it is okay to start.  Diet: Eat a light meal as desired this evening. You may resume your usual diet tomorrow.  Return to work: This is dependent on the type of work you do. For the most part you can return to a desk job within a week of surgery. If you are more active at work, please discuss this with your doctor.  What to expect after your surgery: You may have a slight burning sensation when you urinate on the first day. You may have a very small amount of blood in the urine. Expect to have a small amount of vaginal discharge/light bleeding for 1-2 weeks. It is not unusual to have abdominal soreness and bruising for up to 2 weeks. You may be tired and need more rest for about 1 week. You may experience shoulder pain for 24-72 hours. Lying flat in bed may relieve it.  You may remove the patch behind your ear on or before Saturday 02/12/16.  Wash your hands with soap and water after contact with the  patch.  Do not take ibuprofen/Motrin/Advil products before 4pm 02/09/16.  Call your doctor for any of the following:  Develop a fever of 100.4 or greater  Inability to urinate 6 hours after discharge from hospital  Severe pain not relieved by pain medications  Persistent of heavy bleeding at incision site  Redness or swelling around incision site after a week  Increasing nausea or vomiting  Patient Signature________________________________________ Nurse Signature_________________________________________

## 2016-02-09 NOTE — Anesthesia Preprocedure Evaluation (Addendum)
Anesthesia Evaluation  Patient identified by MRN, date of birth, ID band Patient awake    Reviewed: Allergy & Precautions, H&P , NPO status , Patient's Chart, lab work & pertinent test results, reviewed documented beta blocker date and time   Airway Mallampati: I  TM Distance: >3 FB Neck ROM: full    Dental no notable dental hx.    Pulmonary former smoker,    Pulmonary exam normal breath sounds clear to auscultation       Cardiovascular Normal cardiovascular exam Rhythm:regular Rate:Normal     Neuro/Psych Anxiety    GI/Hepatic   Endo/Other    Renal/GU      Musculoskeletal   Abdominal   Peds  Hematology   Anesthesia Other Findings   Reproductive/Obstetrics                            Anesthesia Physical Anesthesia Plan  ASA: II  Anesthesia Plan: General   Post-op Pain Management:    Induction: Intravenous  Airway Management Planned: Oral ETT  Additional Equipment:   Intra-op Plan:   Post-operative Plan: Extubation in OR  Informed Consent: I have reviewed the patients History and Physical, chart, labs and discussed the procedure including the risks, benefits and alternatives for the proposed anesthesia with the patient or authorized representative who has indicated his/her understanding and acceptance.   Dental Advisory Given and Dental advisory given  Plan Discussed with: CRNA and Surgeon  Anesthesia Plan Comments: (  Discussed general anesthesia, including possible nausea, instrumentation of airway, sore throat,pulmonary aspiration, etc. I asked if the were any outstanding questions, or  concerns before we proceeded. )        Anesthesia Quick Evaluation

## 2016-02-09 NOTE — Interval H&P Note (Signed)
History and Physical Interval Note:  02/09/2016 7:54 AM  Victoria Hall  has presented today for surgery, with the diagnosis of hydrosalpinx  The various methods of treatment have been discussed with the patient and family. After consideration of risks, benefits and other options for treatment, the patient has consented to  Procedure(s): LAPAROSCOPIC LEFT  SALPINGECTOMY (Left) POSSIBLE LEFT OOPHORECTOMY (Left) as a surgical intervention .  The patient's history has been reviewed, patient examined, no change in status, stable for surgery.  I have reviewed the patient's chart and labs.  Questions were answered to the patient's satisfaction.     Bovard-Stuckert, Virginio Isidore

## 2016-02-09 NOTE — Transfer of Care (Signed)
Immediate Anesthesia Transfer of Care Note  Patient: Victoria Hall  Procedure(s) Performed: Procedure(s) with comments: LAPAROSCOPIC LEFT  SALPINGECTOMY (Left) LEFT OOPHORECTOMY (Left) LAPAROSCOPIC LYSIS OF ADHESIONS - momentum to adenexa LAPAROSCOPIC OVARIAN CYSTECTOMY (Right) - x2 to right ovary  Patient Location: PACU  Anesthesia Type:General  Level of Consciousness: awake, alert , oriented and patient cooperative  Airway & Oxygen Therapy: Patient Spontanous Breathing and Patient connected to nasal cannula oxygen  Post-op Assessment: Report given to RN and Post -op Vital signs reviewed and stable  Post vital signs: Reviewed and stable  Last Vitals:  TEMP 09.7 BP 98/65 HR 82 RR 16 POX 100  Last Pain: 0     Patients Stated Pain Goal: 4 ([id number])  Complications: No apparent anesthesia complications

## 2016-02-09 NOTE — Anesthesia Procedure Notes (Signed)
Procedure Name: Intubation Date/Time: 02/09/2016 8:41 AM Performed by: Brock Ra Pre-anesthesia Checklist: Patient identified, Emergency Drugs available, Suction available, Patient being monitored and Timeout performed Patient Re-evaluated:Patient Re-evaluated prior to inductionOxygen Delivery Method: Circle system utilized Preoxygenation: Pre-oxygenation with 100% oxygen Intubation Type: IV induction Ventilation: Mask ventilation without difficulty Laryngoscope Size: Mac and 4 Grade View: Grade I Tube type: Oral Tube size: 7.0 mm Number of attempts: 1 Airway Equipment and Method: Stylet Placement Confirmation: ETT inserted through vocal cords under direct vision,  positive ETCO2 and breath sounds checked- equal and bilateral Secured at: 21 (lower teeth) cm Tube secured with: Tape Dental Injury: Teeth and Oropharynx as per pre-operative assessment

## 2016-02-09 NOTE — Brief Op Note (Signed)
02/09/2016  10:22 AM  PATIENT:  Victoria Hall  45 y.o. female  PRE-OPERATIVE DIAGNOSIS:  Hydrosalpinx, hemorrhagic ovarian cyst - left  POST-OPERATIVE DIAGNOSIS:  Hydrosalpinx, hemorrhagic L ovarian cyst  PROCEDURE:  Laparoscopic LSO, LOA R cornua/adnexa to omentum, R ovarian cystectomy x 2  SURGEON:  Surgeon(s) and Role:    * Janyth Contes, MD - Primary  ASSISTANTS: Trellis Moment RNFA   ANESTHESIA:   local and general   FINDINGS: omental adhesions to R cornua, L hydrosalpinx, L hemorrhagic cyst.  Previously excised R tube,  R ovary with multiple small cysts, likely follicular.  Nl uterus.    EBL:  Total I/O In: 1000 [I.V.:1000] Out: 155 [Urine:150; Blood:5]  BLOOD ADMINISTERED:none  DRAINS: Urinary Catheter (Foley) d/c before PACU   LOCAL MEDICATIONS USED:  MARCAINE    and Amount: 25 ml  SPECIMEN:  Source of Specimen:  L tube and ovary  DISPOSITION OF SPECIMEN:  PATHOLOGY  COUNTS:  YES  TOURNIQUET:  * No tourniquets in log *  DICTATION: .Other Dictation: Dictation Number [id number]  PLAN OF CARE: Discharge to home after PACU  PATIENT DISPOSITION:  PACU - hemodynamically stable.   Delay start of Pharmacological VTE agent (>24hrs) due to surgical blood loss or risk of bleeding: not applicable

## 2016-02-10 ENCOUNTER — Encounter (HOSPITAL_COMMUNITY): Payer: Self-pay | Admitting: Obstetrics and Gynecology

## 2016-02-10 NOTE — Op Note (Signed)
NAMECESLEY, BRACCIA NO.:  [id number]  MEDICAL RECORD NO.:  [id number]  LOCATION:  [facility]                           FACILITY:  [facility]  PHYSICIAN:  Thornell Sartorius, MD        DATE OF BIRTH:  1970-12-17  DATE OF PROCEDURE:  02/09/2016 DATE OF DISCHARGE:  02/09/2016                              OPERATIVE REPORT   PREOPERATIVE DIAGNOSIS:  Hydrosalpinx, hemorrhagic ovarian cyst, both left-sided.  POSTOPERATIVE DIAGNOSIS:  Hydrosalpinx, multiple small right ovarian cysts,  excised.  PROCEDURES:  Laparoscopic left salpingectomy, lysis of adhesions of the right cornua adnexa to the omentum, right ovarian cystectomy x2.  ASSISTANT:  Trellis Moment, RNFA.  ANESTHESIA:  General.  FINDINGS:  Omental adhesions to the right cornua, left hydrosalpinx, no left hemorrhagic cyst seen, previously excised right tube, right ovary with multiple small likely follicular cysts.  Normal uterus.  EBL:  Approximately 5 mL.  URINE OUTPUT:  150 mL clear urine.  IV FLUIDS:  1000 mL.  COMPLICATIONS:  None.  PATHOLOGY:  Left tube.  PROCEDURE:  After informed consent was reviewed with the patient including risks, benefits, and alternatives of surgical procedure, she was transported to the OR and placed on the table in a supine position, was placed in yellowfin stirrups and general anesthesia was induced and found to be adequate.  She was then prepped and draped in the normal sterile fashion.  Foley catheter was sterilely placed and using an open- sided speculum, a Hulka manipulator was placed on the cervix into the uterus.  Gloves were changed.  Attention was turned to the abdominal portion of the case.  Approximately 2 mm vertical infraumbilical incision was made and using a hemostat her fascia was cleared.  The fascia was grasped and elevated with Kocher clamps, excised and the fascia was again grasped with Kocher clamps, elevated, and the fascia was excised and in the peritoneal cavity, a  stay suture was placed with 0 Vicryl.  This was held.  The Hasson trocar with a balloon was placed. The patient was placed in Trendelenburg and the abdomen was explored revealing the above-mentioned findings.  Accessory ports were placed in both the right and the left under direct visualization.  These ports were 5 mm ports.  Attention was turned to the right adhesions to the cornua.  These were excised using the Harmonic Scalpel revealing the right ovary with multiple small likely follicular cysts.  Attention was turned to the left.  The left ovary was elevated after placing a midline port under direct visualization.  The tube and ovary were elevated and using the Harmonic Scalpel the tube was excised. The ureter was visualized during the procedure. The tube was placed in an EndoCatch bag and removed.  The site was noted to be hemostatic.  Two of the cysts were ruptured on the right ovary using the Harmonic Scalpel.  Clear fluid was drained from them.  They were noted to be hemostatic.  The gas was evacuated from the abdomen.  The trocars were removed under direct visualization.  The ports were closed with 4-0 Vicryl.  The deep suture of 2-0 Vicryl was placed at the umbilicus.  The patient tolerated  the procedure well.  Sponge, lap, and needle count were correct x2.     Thornell Sartorius, MD     JB/MEDQ  D:  02/09/2016  T:  02/10/2016  Job:  [id number]

## 2016-02-10 NOTE — Op Note (Deleted)
  The note originally documented on this encounter has been moved the the encounter in which it belongs.  

## 2016-02-17 DIAGNOSIS — R319 Hematuria, unspecified: Secondary | ICD-10-CM | POA: Diagnosis not present

## 2016-02-22 ENCOUNTER — Ambulatory Visit: Payer: BLUE CROSS/BLUE SHIELD | Attending: Family Medicine | Admitting: Physical Therapy

## 2016-02-22 DIAGNOSIS — R2689 Other abnormalities of gait and mobility: Secondary | ICD-10-CM | POA: Insufficient documentation

## 2016-02-22 DIAGNOSIS — M25551 Pain in right hip: Secondary | ICD-10-CM | POA: Insufficient documentation

## 2016-02-22 DIAGNOSIS — R262 Difficulty in walking, not elsewhere classified: Secondary | ICD-10-CM | POA: Diagnosis not present

## 2016-02-22 NOTE — Therapy (Signed)
[facility] [address]  [place], [place], [address] Phone: [phone]   Fax:  [phone]  Physical Therapy Evaluation  Patient Details  Name: Victoria Hall MRN: [id number] Date of Birth: Jan 15, 1971 Referring Provider: Dr. Barbaraann Barthel  Encounter Date: 02/22/2016      PT End of Session - 02/22/16 0852    Visit Number 1   Number of Visits 12   Date for PT Re-Evaluation 04/04/16   PT Start Time 0846   PT Stop Time 0948   PT Time Calculation (min) 62 min   Activity Tolerance Patient tolerated treatment well   Behavior During Therapy [facility] for tasks assessed/performed      Past Medical History:  Diagnosis Date  . Anxiety   . Arthritis   . Cervical intraepithelial neoplasia grade 3 2002   s/p LEEP Rx repeat pap negative  . Cervical radiculopathy   . Fallopian tube disorder    right fallopian tube removed  . Gallstones 09/2015   On CT  . Headache    Migraines  . Hemorrhagic ovarian cyst 02/08/2016  . Hydrosalpinx    [facility]  . Hydrosalpinx 02/08/2016  . Interstitial cystitis   . Neurological abnormality    Neg workup with MRI/MRA in 2007 WNL except decreased caliber MCA proximally, distal cavernous portion of left LCA which may represent true stenosis or technique on exam. Evaluated by Dr Linus Salmons.  . Palpitations   . Partial seizures (HCC)    questionable diag  . Pneumonia    walking pneumonia  . Polycystic ovary    multiple ovarian cysts removed  . S/P cervical discectomy    Dr Vertell Limber, anterior discectomy C5-C7  . S/P epidural steroid injection    Right hip  . Seizures ([place]) 2005   simple partial seizure disorder    Past Surgical History:  Procedure Laterality Date  . APPENDECTOMY  1986  . BUNIONECTOMY Left    bunion removal  . CERVICAL DISC SURGERY  2005   C5-C7  . COLONOSCOPY    . DENTAL SURGERY    . fallopian tue removed    . LAPAROSCOPIC BILATERAL SALPINGECTOMY Left  02/09/2016   Procedure: LAPAROSCOPIC LEFT  SALPINGECTOMY;  Surgeon: Janyth Contes, MD;  Location: [facility];  Service: Gynecology;  Laterality: Left;  . LAPAROSCOPIC LYSIS OF ADHESIONS  02/09/2016   Procedure: LAPAROSCOPIC LYSIS OF ADHESIONS;  Surgeon: Janyth Contes, MD;  Location: [facility];  Service: Gynecology;;  momentum to adenexa  . LAPAROSCOPIC OVARIAN CYSTECTOMY Right 02/09/2016   Procedure: LAPAROSCOPIC OVARIAN CYSTECTOMY;  Surgeon: Janyth Contes, MD;  Location: [facility];  Service: Gynecology;  Laterality: Right;  x2 to right ovary  . LEEP  1998  . OOPHORECTOMY Left 02/09/2016   Procedure: LEFT OOPHORECTOMY;  Surgeon: Janyth Contes, MD;  Location: [facility];  Service: Gynecology;  Laterality: Left;  . TONSILLECTOMY  2001  . UPPER GI ENDOSCOPY      There were no vitals filed for this visit.       Subjective Assessment - 02/22/16 0848    Subjective Patient reports "something is going on wiht my hip." Has had xrays - no diagnositc findings, has had injection from Dr. Barbaraann Barthel - with no relief. Recently has L fallopian tube and ovary removed (approx 2 weeks ago). Feels as if her hip pain is just getting worse. Patient reporting [name] is questioning labral tear. First noticed hip pain approx 1 year ago. Hip pain interferes with daily activities (  getting out of car, walking, sexual intercourse)   Limitations Sitting;Standing;Walking   How long can you sit comfortably? 10 minutes   How long can you stand comfortably? minimal - weight shift to L leg    How long can you walk comfortably? 5 minutes    Diagnostic tests Xray -  no diagnostic findings   Currently in Pain? Yes   Pain Score 2   up to 7-8/10 with getting in/out of car, moving.    Pain Location Hip   Pain Orientation Right   Pain Descriptors / Indicators Stabbing  catching   Pain Type Chronic pain   Pain Onset More than a month ago   Pain Frequency Constant   Aggravating Factors  standing, walking, shifting  weight with R LE, adducting R LE    Pain Relieving Factors rest            [facility] PT Assessment - 02/22/16 0900      Assessment   Medical Diagnosis R hip pain   Referring Provider Dr. Barbaraann Barthel   Onset Date/Surgical Date --  approx 1 year ago   Next MD Visit prn   Prior Therapy yes     Precautions   Precautions None     Balance Screen   Has the patient fallen in the past 6 months No   Has the patient had a decrease in activity level because of a fear of falling?  Yes   Is the patient reluctant to leave their home because of a fear of falling?  No     Home Environment   Living Environment Private residence   Living Arrangements Spouse/significant other   Type of [place] to enter   Entrance Stairs-Number of Steps 3   Entrance Stairs-Rails Right   [place] One level     Prior Function   Level of [place] Unemployed  has tried to apply for disability   Leisure paint, write, dog walking     Cognition   Overall Cognitive Status Within Functional Limits for tasks assessed     Observation/Other Assessments   Focus on Therapeutic Outcomes (FOTO)  39 (61% limited, predicted 42% limited)     Posture/Postural Control   Posture/Postural Control Postural limitations   Postural Limitations Rounded Shoulders;Forward head     ROM / Strength   AROM / PROM / Strength AROM;Strength;PROM     AROM   Overall AROM  Within functional limits for tasks performed   Overall AROM Comments --     PROM   Overall PROM Comments R hip IR and adduction limited due to pain while in flexed position; R hip ER limited with FABER test     Strength   Strength Assessment Site Hip;Knee   Right/Left Hip Right;Left   Right Hip Flexion 4-/5  limited by pain   Right Hip Extension 4/5   Right Hip ABduction 4/5  limited by pain   Right Hip ADduction 4-/5  limited by pain   Left Hip Flexion 4+/5   Left Hip Extension 4/5   Left Hip ABduction 4/5    Left Hip ADduction 4/5   Right/Left Knee Right;Left   Right Knee Flexion 4+/5  minimal pain   Right Knee Extension 5/5   Left Knee Flexion 5/5   Left Knee Extension 5/5     Flexibility   Soft Tissue Assessment /Muscle Length yes   Hamstrings WFL - no pain at end ranges  Palpation   Palpation comment Moderate tenderness to R hip greater trochanter     Special Tests    Special Tests Hip Special Tests   Hip Special Tests  Saralyn Pilar (FABER) Test;Hip Scouring;Anterior Hip Impingement Test     Saralyn Pilar [facility]) Test   Findings Positive   Side Right     Hip Scouring   Findings Positive   Side Right     Anterior Hip Impingement Test    Findings Positive   Side  Right     Ambulation/Gait   Gait Comments antalgic                   [facility] Adult PT Treatment/Exercise - 02/22/16 0900      Modalities   Modalities Electrical Stimulation;Moist Heat     Moist Heat Therapy   Number Minutes Moist Heat 15 Minutes   Moist Heat Location Lumbar Spine     Electrical Stimulation   Electrical Stimulation Location R hip   Electrical Stimulation Action IFC   Electrical Stimulation Parameters to tolerance x 15 minutes   Electrical Stimulation Goals Pain                PT Education - 02/22/16 0941    Education provided Yes   Education Details exam findings, POC   Person(s) Educated Patient   Methods Explanation   Comprehension Verbalized understanding             PT Long Term Goals - 02/22/16 1138      PT LONG TERM GOAL #1   Title patient to be independent with HEP (04/04/16)   Status New     PT LONG TERM GOAL #2   Title Patient to report ability or demonstrate ability to ambulate for >/=30 minutes with proper gait mechanics and pain no greater then 2/10 (04/04/16)   Status New     PT LONG TERM GOAL #3   Title Patient to improve R hip strength to >/= 4+/5 with no increase in pain for improved functional mobility (04/04/16)   Status New     PT LONG TERM  GOAL #4   Title Patient to improve PROM/AROM of R hip (adduction, ER, IR) to WNL with pain no greater than 1/10 (04/04/16)   Status New               Plan - 02/22/16 0852    Clinical Impression Statement Zamiyah is a 45 y/o female presenting to [facility] today with primary complaints of R anterior hip and groin pain limiting her walking, balance, and overall mobility. Patient today demonstrating an antalgic gait pattern, slight hip weakness limited due to pain, as well as increase in R hip pain with adduction, IR, and ER. Patient with good passive hamstring flexibility and passive external rotation with hip in flexed position, however unable to toelrate internal rotation with hip flexed. Postive FABER test with inability to fully externally rotate and extend R hip, positive scour test, as well as C-sign of pain. Patient with moderate tenderness of L greater trochanter with palpation, but not reproducing pain in anterior hip and groin. Patient signs and symptoms seemingly consistent with intraarticular hip pathology, however patient with no diagnostic findings on x-ray. Patient to benefit from skilled PT to address the above listed deficits to improve functional mobility and to reduce pain.    Rehab Potential Good   PT Frequency 2x / week   PT Duration 6 weeks   PT Treatment/Interventions ADLs/Self Care Home Management;Cryotherapy;Electrical Stimulation;Iontophoresis 4mg /ml  Dexamethasone;Moist Heat;Ultrasound;Therapeutic exercise;Therapeutic activities;Gait training;Patient/family education;Manual techniques;Vasopneumatic Device;Taping;Dry needling;Balance training;Functional mobility training;Passive range of motion   PT Next Visit Plan gentle hip strengthening/stretching, assess anterior hip tightness   Consulted and Agree with Plan of Care Patient      Patient will benefit from skilled therapeutic intervention in order to improve the following deficits and impairments:  Abnormal gait, Decreased  activity tolerance, Decreased strength, Difficulty walking, Pain, Decreased endurance, Decreased balance, Decreased mobility, Decreased range of motion  Visit Diagnosis: Pain in right hip - Plan: PT plan of care cert/re-cert  Difficulty in walking, not elsewhere classified - Plan: PT plan of care cert/re-cert  Other abnormalities of gait and mobility - Plan: PT plan of care cert/re-cert     Problem List Patient Active Problem List   Diagnosis Date Noted  . Hydrosalpinx 02/08/2016  . Hemorrhagic ovarian cyst 02/08/2016  . Right hip pain 02/03/2016  . MUSCLE SPASM, BACK 06/08/2009  . HYDROSALPINX 02/07/2008  . HEADACHE 10/28/2007  . TACHYCARDIA 10/14/2007  . ANXIETY DISORDER 05/28/2007  . POLYARTHRITIS 05/09/2007  . UNSPECIFIED DISORDERS OF NERVOUS SYSTEM 04/22/2007  . ABDOMINAL PAIN RIGHT LOWER QUADRANT 04/08/2007  . PALPITATIONS, RECURRENT 11/07/2006  . PROBLEMS W/SMELL/TASTE 06/29/2006  . TOBACCO ABUSE 01/08/2006  . VISUAL IMPAIRMENT 01/08/2006  . DISTURBANCE, VISUAL NOS 01/08/2006  . FIBROCYSTIC BREAST DISEASE 01/08/2006  . [place] DISEASE, CERVICAL 01/08/2006  . VERTIGO 01/08/2006      Victoria Hall, PT, DPT 02/22/16 11:52 AM     [facility] [address]  [place], [place], [address] Phone: [phone]   Fax:  [phone]  Name: Victoria Hall MRN: [id number] Date of Birth: [birth year]

## 2016-02-28 ENCOUNTER — Ambulatory Visit: Payer: BLUE CROSS/BLUE SHIELD | Admitting: Physical Therapy

## 2016-02-28 DIAGNOSIS — M25551 Pain in right hip: Secondary | ICD-10-CM | POA: Diagnosis not present

## 2016-02-28 DIAGNOSIS — R262 Difficulty in walking, not elsewhere classified: Secondary | ICD-10-CM | POA: Diagnosis not present

## 2016-02-28 DIAGNOSIS — R2689 Other abnormalities of gait and mobility: Secondary | ICD-10-CM | POA: Diagnosis not present

## 2016-02-28 NOTE — Therapy (Signed)
[facility] [address]  [place], [place], [address] Phone: [phone]   Fax:  [phone]  Physical Therapy Treatment  Patient Details  Name: Victoria Hall MRN: [id number] Date of Birth: 15-Jul-1970 Referring Provider: Dr. Barbaraann Barthel  Encounter Date: 02/28/2016      PT End of Session - 02/28/16 1529    Visit Number 2   Number of Visits 12   Date for PT Re-Evaluation 04/04/16   PT Start Time [id number]   PT Stop Time 1534   PT Time Calculation (min) 47 min   Activity Tolerance Patient tolerated treatment well;Patient limited by pain   Behavior During Therapy [facility] for tasks assessed/performed      Past Medical History:  Diagnosis Date  . Anxiety   . Arthritis   . Cervical intraepithelial neoplasia grade 3 2002   s/p LEEP Rx repeat pap negative  . Cervical radiculopathy   . Fallopian tube disorder    right fallopian tube removed  . Gallstones 09/2015   On CT  . Headache    Migraines  . Hemorrhagic ovarian cyst 02/08/2016  . Hydrosalpinx    [facility]  . Hydrosalpinx 02/08/2016  . Interstitial cystitis   . Neurological abnormality    Neg workup with MRI/MRA in 2007 WNL except decreased caliber MCA proximally, distal cavernous portion of left LCA which may represent true stenosis or technique on exam. Evaluated by Dr Linus Salmons.  . Palpitations   . Partial seizures (HCC)    questionable diag  . Pneumonia    walking pneumonia  . Polycystic ovary    multiple ovarian cysts removed  . S/P cervical discectomy    Dr Vertell Limber, anterior discectomy C5-C7  . S/P epidural steroid injection    Right hip  . Seizures ([place]) 2005   simple partial seizure disorder    Past Surgical History:  Procedure Laterality Date  . APPENDECTOMY  1986  . BUNIONECTOMY Left    bunion removal  . CERVICAL DISC SURGERY  2005   C5-C7  . COLONOSCOPY    . DENTAL SURGERY    . fallopian tue removed    . LAPAROSCOPIC  BILATERAL SALPINGECTOMY Left 02/09/2016   Procedure: LAPAROSCOPIC LEFT  SALPINGECTOMY;  Surgeon: Janyth Contes, MD;  Location: [facility];  Service: Gynecology;  Laterality: Left;  . LAPAROSCOPIC LYSIS OF ADHESIONS  02/09/2016   Procedure: LAPAROSCOPIC LYSIS OF ADHESIONS;  Surgeon: Janyth Contes, MD;  Location: [facility];  Service: Gynecology;;  momentum to adenexa  . LAPAROSCOPIC OVARIAN CYSTECTOMY Right 02/09/2016   Procedure: LAPAROSCOPIC OVARIAN CYSTECTOMY;  Surgeon: Janyth Contes, MD;  Location: [facility];  Service: Gynecology;  Laterality: Right;  x2 to right ovary  . LEEP  1998  . OOPHORECTOMY Left 02/09/2016   Procedure: LEFT OOPHORECTOMY;  Surgeon: Janyth Contes, MD;  Location: [facility];  Service: Gynecology;  Laterality: Left;  . TONSILLECTOMY  2001  . UPPER GI ENDOSCOPY      There were no vitals filed for this visit.      Subjective Assessment - 02/28/16 1450    Subjective Patient reports she is having more spasms/cramps in R leg and foot. Has had a lot of rest - continues to have significant pain.    Diagnostic tests Xray -  no diagnostic findings   Currently in Pain? Yes   Pain Score 4    Pain Location Hip   Pain Orientation Right   Pain Descriptors / Indicators Stabbing   Pain  Type Chronic pain   Pain Onset More than a month ago   Pain Frequency Rarely   Aggravating Factors  standing, wlaking, shifting weight with R LE, adducting R LE.    Pain Relieving Factors rest                         [facility] Adult PT Treatment/Exercise - 02/28/16 1503      Exercises   Exercises Knee/Hip     Knee/Hip Exercises: Stretches   Hip Flexor Stretch Both;3 reps;30 seconds     Knee/Hip Exercises: Aerobic   Nustep Level 3 x 5 minutes  limited by pain     Knee/Hip Exercises: Supine   Bridges 10 reps   Straight Leg Raises 10 reps   Other Supine Knee/Hip Exercises unilateral clam shell - unable on R LE due to pain      Knee/Hip Exercises: Sidelying    Clams R LE x 10 - some pain     Modalities   Modalities Electrical Stimulation;Cryotherapy     Cryotherapy   Number Minutes Cryotherapy 15 Minutes   Cryotherapy Location Hip   Type of Cryotherapy Ice pack     Electrical Stimulation   Electrical Stimulation Location R hip   Electrical Stimulation Action IFC   Electrical Stimulation Parameters to tolerance   Electrical Stimulation Goals Pain     Manual Therapy   Manual Therapy Myofascial release   Myofascial Release psoas release 3 x 30 seconds; 5 x with active hip extension                     PT Long Term Goals - 02/22/16 1138      PT LONG TERM GOAL #1   Title patient to be independent with HEP (04/04/16)   Status New     PT LONG TERM GOAL #2   Title Patient to report ability or demonstrate ability to ambulate for >/=30 minutes with proper gait mechanics and pain no greater then 2/10 (04/04/16)   Status New     PT LONG TERM GOAL #3   Title Patient to improve R hip strength to >/= 4+/5 with no increase in pain for improved functional mobility (04/04/16)   Status New     PT LONG TERM GOAL #4   Title Patient to improve PROM/AROM of R hip (adduction, ER, IR) to WNL with pain no greater than 1/10 (04/04/16)   Status New               Plan - 02/28/16 1529    Clinical Impression Statement Patient today very limited by pain. PT assessing SI joint with compression, distraction, as well as sacral thrust with no pain provocation. Psoas assesed with manual release both in static postion as well as with active hip extension with no true relief reported by patient, however with tightness noted. Patient unable to tolerate >5 minutes on NuStep, supine hip abduction/abduction activity or single limb stance on R LE. Modalities attempted today with estim and ice pack with patinet finding little relief. Patient to continue to benefit from PT to maximize function.    PT Treatment/Interventions ADLs/Self Care Home  Management;Cryotherapy;Electrical Stimulation;Iontophoresis 4mg /ml Dexamethasone;Moist Heat;Ultrasound;Therapeutic exercise;Therapeutic activities;Gait training;Patient/family education;Manual techniques;Vasopneumatic Device;Taping;Dry needling;Balance training;Functional mobility training;Passive range of motion   PT Next Visit Plan gentle hip strengthening/stretching, assess anterior hip tightness   Consulted and Agree with Plan of Care Patient      Patient will benefit from skilled therapeutic intervention  in order to improve the following deficits and impairments:  Abnormal gait, Decreased activity tolerance, Decreased strength, Difficulty walking, Pain, Decreased endurance, Decreased balance, Decreased mobility, Decreased range of motion  Visit Diagnosis: Pain in right hip  Difficulty in walking, not elsewhere classified  Other abnormalities of gait and mobility     Problem List Patient Active Problem List   Diagnosis Date Noted  . Hydrosalpinx 02/08/2016  . Hemorrhagic ovarian cyst 02/08/2016  . Right hip pain 02/03/2016  . MUSCLE SPASM, BACK 06/08/2009  . HYDROSALPINX 02/07/2008  . HEADACHE 10/28/2007  . TACHYCARDIA 10/14/2007  . ANXIETY DISORDER 05/28/2007  . POLYARTHRITIS 05/09/2007  . UNSPECIFIED DISORDERS OF NERVOUS SYSTEM 04/22/2007  . ABDOMINAL PAIN RIGHT LOWER QUADRANT 04/08/2007  . PALPITATIONS, RECURRENT 11/07/2006  . PROBLEMS W/SMELL/TASTE 06/29/2006  . TOBACCO ABUSE 01/08/2006  . VISUAL IMPAIRMENT 01/08/2006  . DISTURBANCE, VISUAL NOS 01/08/2006  . FIBROCYSTIC BREAST DISEASE 01/08/2006  . [place] DISEASE, CERVICAL 01/08/2006  . VERTIGO 01/08/2006      Lanney Gins, PT, DPT 02/28/16 4:51 PM      [facility] [address]  [place], [place], [address] Phone: [phone]   Fax:  [phone]  Name: Victoria Hall MRN: [id number] Date of Birth: 12-18-1970

## 2016-03-01 ENCOUNTER — Ambulatory Visit: Payer: BLUE CROSS/BLUE SHIELD | Admitting: Physical Therapy

## 2016-03-01 DIAGNOSIS — R2689 Other abnormalities of gait and mobility: Secondary | ICD-10-CM

## 2016-03-01 DIAGNOSIS — R262 Difficulty in walking, not elsewhere classified: Secondary | ICD-10-CM

## 2016-03-01 DIAGNOSIS — M25551 Pain in right hip: Secondary | ICD-10-CM | POA: Diagnosis not present

## 2016-03-01 NOTE — Therapy (Addendum)
[facility] [address]  [place], [place], [address] Phone: [phone]   Fax:  [phone]  Physical Therapy Treatment  Patient Details  Name: Victoria Hall MRN: [id number] Date of Birth: [birth year] Referring Provider: Dr. Barbaraann Barthel  Encounter Date: 03/01/2016      PT End of Session - 03/01/16 1344    Visit Number 3   Number of Visits 12   Date for PT Re-Evaluation 04/04/16   PT Start Time 1017   PT Stop Time 1101   PT Time Calculation (min) 44 min   Activity Tolerance Patient tolerated treatment well;Patient limited by pain   Behavior During Therapy [facility] for tasks assessed/performed      Past Medical History:  Diagnosis Date  . Anxiety   . Arthritis   . Cervical intraepithelial neoplasia grade 3 2002   s/p LEEP Rx repeat pap negative  . Cervical radiculopathy   . Fallopian tube disorder    right fallopian tube removed  . Gallstones 09/2015   On CT  . Headache    Migraines  . Hemorrhagic ovarian cyst 02/08/2016  . Hydrosalpinx    [facility]  . Hydrosalpinx 02/08/2016  . Interstitial cystitis   . Neurological abnormality    Neg workup with MRI/MRA in 2007 WNL except decreased caliber MCA proximally, distal cavernous portion of left LCA which may represent true stenosis or technique on exam. Evaluated by Dr Linus Salmons.  . Palpitations   . Partial seizures (HCC)    questionable diag  . Pneumonia    walking pneumonia  . Polycystic ovary    multiple ovarian cysts removed  . S/P cervical discectomy    Dr Vertell Limber, anterior discectomy C5-C7  . S/P epidural steroid injection    Right hip  . Seizures ([place]) 2005   simple partial seizure disorder    Past Surgical History:  Procedure Laterality Date  . APPENDECTOMY  1986  . BUNIONECTOMY Left    bunion removal  . CERVICAL DISC SURGERY  2005   C5-C7  . COLONOSCOPY    . DENTAL SURGERY    . fallopian tue removed    . LAPAROSCOPIC  BILATERAL SALPINGECTOMY Left 02/09/2016   Procedure: LAPAROSCOPIC LEFT  SALPINGECTOMY;  Surgeon: Janyth Contes, MD;  Location: [facility];  Service: Gynecology;  Laterality: Left;  . LAPAROSCOPIC LYSIS OF ADHESIONS  02/09/2016   Procedure: LAPAROSCOPIC LYSIS OF ADHESIONS;  Surgeon: Janyth Contes, MD;  Location: [facility];  Service: Gynecology;;  momentum to adenexa  . LAPAROSCOPIC OVARIAN CYSTECTOMY Right 02/09/2016   Procedure: LAPAROSCOPIC OVARIAN CYSTECTOMY;  Surgeon: Janyth Contes, MD;  Location: [facility];  Service: Gynecology;  Laterality: Right;  x2 to right ovary  . LEEP  1998  . OOPHORECTOMY Left 02/09/2016   Procedure: LEFT OOPHORECTOMY;  Surgeon: Janyth Contes, MD;  Location: [facility];  Service: Gynecology;  Laterality: Left;  . TONSILLECTOMY  2001  . UPPER GI ENDOSCOPY      There were no vitals filed for this visit.      Subjective Assessment - 03/01/16 1337    Subjective Had some trouble driving yesterday (over 4 hours); did some research on stretches and exericses - unsure of what she is able to do.   Diagnostic tests Xray -  no diagnostic findings   Currently in Pain? Yes   Pain Score 4    Pain Location Hip   Pain Orientation Right   Pain Descriptors / Indicators Stabbing  catching  Pain Type Chronic pain   Pain Onset More than a month ago   Pain Frequency Constant   Aggravating Factors  standing, walking, shifting weight onto R LE, adducting R LE   Pain Relieving Factors rest                         [facility] Adult PT Treatment/Exercise - 03/01/16 1339      Knee/Hip Exercises: Stretches   Hip Flexor Stretch Both;3 reps;30 seconds   Piriformis Stretch Limitations attempted sitting piriformis stretch with pain limiting   Other Knee/Hip Stretches kneeling adductor stretch; Both; 3 reps x 30 seconds   Other Knee/Hip Stretches foam roller to R glute region x 3 minutes      Knee/Hip Exercises: Aerobic   Stationary Bike level 1 x 8 minutes    Elliptical level 1 x 5 minutes     Knee/Hip Exercises: Machines for Strengthening   Cybex Leg Press 20# x 10; 35# x 10     Knee/Hip Exercises: Standing   Functional Squat 15 reps  TRX   Wall Squat 10 reps   Wall Squat Limitations with green ball between knees to reduced abduction on R knee   Other Standing Knee Exercises side stepping 20 feet each direction with red tband at ankles                     PT Long Term Goals - 03/01/16 1344      PT LONG TERM GOAL #1   Title patient to be independent with HEP (04/04/16)   Status On-going     PT LONG TERM GOAL #2   Title Patient to report ability or demonstrate ability to ambulate for >/=30 minutes with proper gait mechanics and pain no greater then 2/10 (04/04/16)   Status On-going     PT LONG TERM GOAL #3   Title Patient to improve R hip strength to >/= 4+/5 with no increase in pain for improved functional mobility (04/04/16)   Status On-going     PT LONG TERM GOAL #4   Title Patient to improve PROM/AROM of R hip (adduction, ER, IR) to WNL with pain no greater than 1/10 (04/04/16)   Status On-going               Plan - 03/01/16 1345    Clinical Impression Statement Patient today able to tolerate recumbant bike, however, requires weight shift to L as well as reduced hip flexion to prevent pain. Patient also tolerating elliptical with low resistance with no increase in pain. Patient appears to prefer all motions in the sagital and frontal plane such as squat, leg press, side stepping, however can not tolerate rotary movements of the R hip. Patient concerned that her hip is now bothering her to drive for prolonged periods. Patient to continue to benefit form PT to progress function and mobility.    PT Treatment/Interventions ADLs/Self Care Home Management;Cryotherapy;Electrical Stimulation;Iontophoresis '4mg'$ /ml Dexamethasone;Moist Heat;Ultrasound;Therapeutic exercise;Therapeutic activities;Gait training;Patient/family  education;Manual techniques;Vasopneumatic Device;Taping;Dry needling;Balance training;Functional mobility training;Passive range of motion   PT Next Visit Plan gentle hip strengthening/stretching   Consulted and Agree with Plan of Care Patient      Patient will benefit from skilled therapeutic intervention in order to improve the following deficits and impairments:  Abnormal gait, Decreased activity tolerance, Decreased strength, Difficulty walking, Pain, Decreased endurance, Decreased balance, Decreased mobility, Decreased range of motion  Visit Diagnosis: Pain in right hip  Difficulty in walking, not elsewhere  classified  Other abnormalities of gait and mobility     Problem List Patient Active Problem List   Diagnosis Date Noted  . Hydrosalpinx 02/08/2016  . Hemorrhagic ovarian cyst 02/08/2016  . Right hip pain 02/03/2016  . MUSCLE SPASM, BACK 06/08/2009  . HYDROSALPINX 02/07/2008  . HEADACHE 10/28/2007  . TACHYCARDIA 10/14/2007  . ANXIETY DISORDER 05/28/2007  . POLYARTHRITIS 05/09/2007  . UNSPECIFIED DISORDERS OF NERVOUS SYSTEM 04/22/2007  . ABDOMINAL PAIN RIGHT LOWER QUADRANT 04/08/2007  . PALPITATIONS, RECURRENT 11/07/2006  . PROBLEMS W/SMELL/TASTE 06/29/2006  . TOBACCO ABUSE 01/08/2006  . VISUAL IMPAIRMENT 01/08/2006  . DISTURBANCE, VISUAL NOS 01/08/2006  . FIBROCYSTIC BREAST DISEASE 01/08/2006  . [place] DISEASE, CERVICAL 01/08/2006  . VERTIGO 01/08/2006       Lanney Gins, PT, DPT 03/01/16 1:51 PM   PHYSICAL THERAPY DISCHARGE SUMMARY  Visits from Start of Care: 3  Current functional level related to goals / functional outcomes: See above; poor progress with PT due to increasing hip pain.    Remaining deficits: See above; limited ROM of hip, reduced strength, decreased activity tolerance - all limited secondary to pain   Education / Equipment: HEP  Plan: Patient agrees to discharge.  Patient goals were not met. Patient is being discharged due to  the physician's request.  ?????    PT terminated due to possible labral tear of R hip. MD requesting to stop PT until further imaging and work-up of patient has been done. Limited progress with PT due to high pain levels limiting movement and functional progress.   Lanney Gins, PT, DPT 03/31/16 8:16 AM   [facility] [address]  [place], [place], [address] Phone: [phone]   Fax:  [phone]  Name: Victoria Hall MRN: [id number] Date of Birth: 27-Aug-1970

## 2016-03-08 ENCOUNTER — Ambulatory Visit (INDEPENDENT_AMBULATORY_CARE_PROVIDER_SITE_OTHER): Payer: BLUE CROSS/BLUE SHIELD | Admitting: Family Medicine

## 2016-03-08 ENCOUNTER — Ambulatory Visit: Payer: BLUE CROSS/BLUE SHIELD | Admitting: Physical Therapy

## 2016-03-08 ENCOUNTER — Encounter: Payer: Self-pay | Admitting: Family Medicine

## 2016-03-08 DIAGNOSIS — M25551 Pain in right hip: Secondary | ICD-10-CM

## 2016-03-08 NOTE — Patient Instructions (Signed)
We will go ahead with an MRI arthrogram to confirm a labral tear. I will call you the business day following this to go over results and next steps. I would take frequent breaks from prolonged standing or sitting. Use a cane to help with walking. Do home exercises that do not make the pain worse. Intermittent cortisone injections are an option but are unlikely to fix this problem long term.

## 2016-03-09 NOTE — Assessment & Plan Note (Signed)
Not improving with PT.  Pain more localized now to groin/anterior hip.  Concerning for labral tear.  Advised we go ahead with MR arthrogram at this point.  Stop PT for now.  Icing, tylenol or aleve as needed.

## 2016-03-09 NOTE — Progress Notes (Addendum)
PCP and consultation requested by: Lamar Blinks, MD  Subjective:   HPI: Patient is a 45 y.o. female here for right hip pain.  11/1: Patient reports she's had problems with right hip pain past 6 months. Pain worse with walking, sitting, standing a long time. Pain was intermittent initially but more constant these 6 months. Pain is lateral, anterior, and posterior. Difficulty getting comfortable. Cannot lie on right side. Feels grinding/popping. Tried advil, flexeril. Pain 3/10 at rest, up to 7/10 and sharp at worst. No skin changes, numbness. No bowel/bladder dysfunction.  12/6: Patient reports she has not improved since last visit. Pain level 1/10 at rest, up to 8/10 with movement. Feels like hip catches, gives out. Tried TENS, heat, ice. Pain anterior groin. Not improved with bursa injection. No skin changes, numbness. No bowel/bladder dysfunction.  Past Medical History:  Diagnosis Date  . Anxiety   . Arthritis   . Cervical intraepithelial neoplasia grade 3 2002   s/p LEEP Rx repeat pap negative  . Cervical radiculopathy   . Fallopian tube disorder    right fallopian tube removed  . Gallstones 09/2015   On CT  . Headache    Migraines  . Hemorrhagic ovarian cyst 02/08/2016  . Hydrosalpinx    [facility]  . Hydrosalpinx 02/08/2016  . Interstitial cystitis   . Neurological abnormality    Neg workup with MRI/MRA in 2007 WNL except decreased caliber MCA proximally, distal cavernous portion of left LCA which may represent true stenosis or technique on exam. Evaluated by Dr Linus Salmons.  . Palpitations   . Partial seizures (HCC)    questionable diag  . Pneumonia    walking pneumonia  . Polycystic ovary    multiple ovarian cysts removed  . S/P cervical discectomy    Dr Vertell Limber, anterior discectomy C5-C7  . S/P epidural steroid injection    Right hip  . Seizures ([place]) 2005   simple partial seizure disorder    Current Outpatient Prescriptions on  File Prior to Visit  Medication Sig Dispense Refill  . HYDROcodone-acetaminophen (NORCO/VICODIN) 5-325 MG tablet Take 1-2 tablets by mouth every 6 (six) hours as needed for moderate pain. (Patient not taking: Reported on 02/22/2016) 20 tablet 0  . ibuprofen (ADVIL,MOTRIN) 800 MG tablet Take 1 tablet (800 mg total) by mouth every 8 (eight) hours as needed for moderate pain. 45 tablet 1  . Multiple Vitamins-Minerals (WOMENS MULTIVITAMIN PO) Take 1 tablet by mouth daily.     No current facility-administered medications on file prior to visit.     Past Surgical History:  Procedure Laterality Date  . APPENDECTOMY  1986  . BUNIONECTOMY Left    bunion removal  . CERVICAL DISC SURGERY  2005   C5-C7  . COLONOSCOPY    . DENTAL SURGERY    . fallopian tue removed    . LAPAROSCOPIC BILATERAL SALPINGECTOMY Left 02/09/2016   Procedure: LAPAROSCOPIC LEFT  SALPINGECTOMY;  Surgeon: Janyth Contes, MD;  Location: [facility];  Service: Gynecology;  Laterality: Left;  . LAPAROSCOPIC LYSIS OF ADHESIONS  02/09/2016   Procedure: LAPAROSCOPIC LYSIS OF ADHESIONS;  Surgeon: Janyth Contes, MD;  Location: [facility];  Service: Gynecology;;  momentum to adenexa  . LAPAROSCOPIC OVARIAN CYSTECTOMY Right 02/09/2016   Procedure: LAPAROSCOPIC OVARIAN CYSTECTOMY;  Surgeon: Janyth Contes, MD;  Location: [facility];  Service: Gynecology;  Laterality: Right;  x2 to right ovary  . LEEP  1998  . OOPHORECTOMY Left 02/09/2016   Procedure: LEFT OOPHORECTOMY;  Surgeon: Janyth Contes, MD;  Location: [facility];  Service: Gynecology;  Laterality: Left;  . TONSILLECTOMY  2001  . UPPER GI ENDOSCOPY      Allergies  Allergen Reactions  . Codeine Nausea And Vomiting  . Cyclobenzaprine Other (See Comments)    Fast heartbeat, restless leg, nightmares  . Xanax [Alprazolam]     Bruising    Social History   Social History  . Marital status: Divorced    Spouse name: N/A  . Number of children: 0  . 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 Smoking status: Former Smoker    Packs/day: 0.50    Years: 10.00    Types: Cigarettes    Quit date: 11/02/2015  . Smokeless tobacco: Never Used     Comment: currently e cigarettes only  . Alcohol use 0.6 - 1.2 oz/week    1 - 2 Standard drinks or equivalent per week     Comment: occasionally 0-2 per day  . Drug use: No  . Sexual activity: Yes   Other Topics Concern  . Not on file   Social History Narrative  . No narrative on file    Family History  Problem Relation Age of Onset  . Adopted: Yes    BP 114/79   Pulse 96   Ht 5\' 5"  (1.651 m)   Wt 175 lb (79.4 kg)   BMI 29.12 kg/m   Review of Systems: See HPI above.    Objective:  Physical Exam:  Gen: NAD, comfortable in exam room  Back/right hip: No gross deformity, scoliosis. Mild TTP right piriformis.  No other tenderness.  No midline or other bony TTP. FROM back without pain.  FROM hip with pain on internal and external rotation. Strength LEs 5/5 all muscle groups Negative SLRs. Sensation intact to light touch bilaterally. Positive logroll right hip, negative left Negative fabers and piriformis stretches.    Assessment & Plan:  1. Right hip pain - Not improving with PT.  Pain more localized now to groin/anterior hip.  Concerning for labral tear.  Advised we go ahead with MR arthrogram at this point.  Stop PT for now.  Icing, tylenol or aleve as needed.  Addendum:  MRI reviewed and discussed with patient.  She does have a small labral tear as expected.  She would like to go ahead with ortho referral.

## 2016-03-10 ENCOUNTER — Ambulatory Visit: Payer: BLUE CROSS/BLUE SHIELD | Admitting: Physical Therapy

## 2016-03-10 NOTE — Addendum Note (Signed)
Addended by: Sherrie George F on: 03/10/2016 08:16 AM   Modules accepted: Orders

## 2016-03-13 ENCOUNTER — Ambulatory Visit: Payer: BLUE CROSS/BLUE SHIELD | Admitting: Physical Therapy

## 2016-03-14 ENCOUNTER — Emergency Department (HOSPITAL_COMMUNITY): Payer: BLUE CROSS/BLUE SHIELD

## 2016-03-14 ENCOUNTER — Emergency Department (HOSPITAL_COMMUNITY)
Admission: EM | Admit: 2016-03-14 | Discharge: 2016-03-14 | Disposition: A | Discharge status: Home / Self Care | Payer: BLUE CROSS/BLUE SHIELD | Attending: Emergency Medicine | Admitting: Emergency Medicine

## 2016-03-14 ENCOUNTER — Ambulatory Visit: Payer: BLUE CROSS/BLUE SHIELD | Admitting: Neurology

## 2016-03-14 ENCOUNTER — Encounter (HOSPITAL_COMMUNITY): Payer: Self-pay | Admitting: *Deleted

## 2016-03-14 DIAGNOSIS — Z87891 Personal history of nicotine dependence: Secondary | ICD-10-CM | POA: Diagnosis not present

## 2016-03-14 DIAGNOSIS — R002 Palpitations: Secondary | ICD-10-CM | POA: Diagnosis not present

## 2016-03-14 DIAGNOSIS — R0602 Shortness of breath: Secondary | ICD-10-CM | POA: Diagnosis not present

## 2016-03-14 LAB — CBC
HCT: 42.7 % (ref 36.0–46.0)
Hemoglobin: 14.6 g/dL (ref 12.0–15.0)
MCH: 28.6 pg (ref 26.0–34.0)
MCHC: 34.2 g/dL (ref 30.0–36.0)
MCV: 83.6 fL (ref 78.0–100.0)
Platelets: 306 10*3/uL (ref 150–400)
RBC: 5.11 MIL/uL (ref 3.87–5.11)
RDW: 13.3 % (ref 11.5–15.5)
WBC: 9.1 10*3/uL (ref 4.0–10.5)

## 2016-03-14 LAB — BASIC METABOLIC PANEL
Anion gap: 7 (ref 5–15)
BUN: 7 mg/dL (ref 6–20)
CO2: 25 mmol/L (ref 22–32)
Calcium: 9.3 mg/dL (ref 8.9–10.3)
Chloride: 105 mmol/L (ref 101–111)
Creatinine, Ser: 0.86 mg/dL (ref 0.44–1.00)
GFR calc Af Amer: >60 mL/min (ref >60)
GFR calc non Af Amer: >60 mL/min (ref >60)
Glucose, Bld: 100 mg/dL — ABNORMAL HIGH (ref 65–99)
Potassium: 3.8 mmol/L (ref 3.5–5.1)
Sodium: 137 mmol/L (ref 135–145)

## 2016-03-14 LAB — I-STAT TROPONIN, ED: Troponin i, poc: 0 ng/mL (ref 0.00–0.08)

## 2016-03-14 NOTE — Discharge Instructions (Signed)
It was so nice to meet you!  You came to the hospital because you were having palpitations and shortness of breath that got better on its own. All of your labs and imaging studies were negative.   We would recommend that you follow-up with your primary care doctor to discuss if you would benefit from a 30 day event monitor to determine the cause of your palpitations.

## 2016-03-14 NOTE — ED Triage Notes (Addendum)
Pt reports onset this am of palpitations and sob. Denies any cardiac hx. ekg done, HR 86 at triage. Denies any cp, reports having upset stomach and mild chest pain yesterday.

## 2016-03-14 NOTE — ED Provider Notes (Signed)
[facility] DEPT Provider Note   CSN: [id number] Arrival date & time: 03/14/16  0905     History   Chief Complaint Chief Complaint  Patient presents with  . Palpitations    HPI Victoria Hall is a 45 y.o. female with a PMH of partial seizures and anxiety presenting to the ED with palpations. This morning, she was letting her dogs outside when she suddenly felt short of breath and noticed that her heart was racing. She started to feel lightheaded so she went inside and laid down. Her heart continued to race off and on for 5 minutes and then stopped. She states this happened 1.5 months ago when she was taking Flexeril. The palpitations stopped when she stopped the Flexeril and she hasn't had any issues until today. She has not taken any new prescription medications or OTC medications. She denies any chest pain. She states she has not been anxious. She denies any changes in vision, arm/leg weakness. No drug use. Occasional alcohol use.  HPI  Past Medical History:  Diagnosis Date  . Anxiety   . Arthritis   . Cervical intraepithelial neoplasia grade 3 2002   s/p LEEP Rx repeat pap negative  . Cervical radiculopathy   . Fallopian tube disorder    right fallopian tube removed  . Gallstones 09/2015   On CT  . Headache    Migraines  . Hemorrhagic ovarian cyst 02/08/2016  . Hydrosalpinx    [facility]  . Hydrosalpinx 02/08/2016  . Interstitial cystitis   . Neurological abnormality    Neg workup with MRI/MRA in 2007 WNL except decreased caliber MCA proximally, distal cavernous portion of left LCA which may represent true stenosis or technique on exam. Evaluated by Dr Linus Salmons.  . Palpitations   . Partial seizures (HCC)    questionable diag  . Pneumonia    walking pneumonia  . Polycystic ovary    multiple ovarian cysts removed  . S/P cervical discectomy    Dr Vertell Limber, anterior discectomy C5-C7  . S/P epidural steroid injection    Right hip  . Seizures ([place])  2005   simple partial seizure disorder    Patient Active Problem List   Diagnosis Date Noted  . Hydrosalpinx 02/08/2016  . Hemorrhagic ovarian cyst 02/08/2016  . Right hip pain 02/03/2016  . MUSCLE SPASM, BACK 06/08/2009  . HYDROSALPINX 02/07/2008  . HEADACHE 10/28/2007  . TACHYCARDIA 10/14/2007  . ANXIETY DISORDER 05/28/2007  . POLYARTHRITIS 05/09/2007  . UNSPECIFIED DISORDERS OF NERVOUS SYSTEM 04/22/2007  . ABDOMINAL PAIN RIGHT LOWER QUADRANT 04/08/2007  . PALPITATIONS, RECURRENT 11/07/2006  . PROBLEMS W/SMELL/TASTE 06/29/2006  . TOBACCO ABUSE 01/08/2006  . VISUAL IMPAIRMENT 01/08/2006  . DISTURBANCE, VISUAL NOS 01/08/2006  . FIBROCYSTIC BREAST DISEASE 01/08/2006  . [place] DISEASE, CERVICAL 01/08/2006  . VERTIGO 01/08/2006    Past Surgical History:  Procedure Laterality Date  . APPENDECTOMY  1986  . BUNIONECTOMY Left    bunion removal  . CERVICAL DISC SURGERY  2005   C5-C7  . COLONOSCOPY    . DENTAL SURGERY    . fallopian tue removed    . LAPAROSCOPIC BILATERAL SALPINGECTOMY Left 02/09/2016   Procedure: LAPAROSCOPIC LEFT  SALPINGECTOMY;  Surgeon: Janyth Contes, MD;  Location: [facility];  Service: Gynecology;  Laterality: Left;  . LAPAROSCOPIC LYSIS OF ADHESIONS  02/09/2016   Procedure: LAPAROSCOPIC LYSIS OF ADHESIONS;  Surgeon: Janyth Contes, MD;  Location: [facility];  Service: Gynecology;;  momentum to adenexa  . LAPAROSCOPIC OVARIAN CYSTECTOMY Right  02/09/2016   Procedure: LAPAROSCOPIC OVARIAN CYSTECTOMY;  Surgeon: Janyth Contes, MD;  Location: [facility];  Service: Gynecology;  Laterality: Right;  x2 to right ovary  . LEEP  1998  . OOPHORECTOMY Left 02/09/2016   Procedure: LEFT OOPHORECTOMY;  Surgeon: Janyth Contes, MD;  Location: [facility];  Service: Gynecology;  Laterality: Left;  . TONSILLECTOMY  2001  . UPPER GI ENDOSCOPY      OB History    No data available       Home Medications    Prior to Admission medications   Medication Sig Start  Date End Date Taking? Authorizing Provider  HYDROcodone-acetaminophen (NORCO/VICODIN) 5-325 MG tablet Take 1-2 tablets by mouth every 6 (six) hours as needed for moderate pain. Patient not taking: Reported on 02/22/2016 02/09/16   Janyth Contes, MD  ibuprofen (ADVIL,MOTRIN) 800 MG tablet Take 1 tablet (800 mg total) by mouth every 8 (eight) hours as needed for moderate pain. 02/09/16   Janyth Contes, MD  Multiple Vitamins-Minerals (WOMENS MULTIVITAMIN PO) Take 1 tablet by mouth daily.    Historical Provider, MD    Family History Family History  Problem Relation Age of Onset  . Adopted: Yes    Social History Social History  Substance Use Topics  . Smoking status: Former Smoker    Packs/day: 0.50    Years: 10.00    Types: Cigarettes    Quit date: 11/02/2015  . Smokeless tobacco: Never Used     Comment: currently e cigarettes only  . Alcohol use 0.6 - 1.2 oz/week    1 - 2 Standard drinks or equivalent per week     Comment: occasionally 0-2 per day     Allergies   Codeine; Cyclobenzaprine; and Xanax [alprazolam]   Review of Systems Review of Systems 10 Systems reviewed and are negative for acute change except as noted in the HPI.  Physical Exam Updated Vital Signs BP 104/73 (BP Location: Right Arm)   Pulse 80   Temp 97.3 F (36.3 C) (Oral)   Resp 18   Ht 5\' 5"  (1.651 m)   Wt 79.4 kg   SpO2 98%   BMI 29.12 kg/m   Physical Exam  Constitutional: She appears well-developed and well-nourished. No distress.  HENT:  Head: Normocephalic and atraumatic.  Eyes: Conjunctivae and EOM are normal. Pupils are equal, round, and reactive to light.  Neck: Normal range of motion. Neck supple.  Cardiovascular: Normal rate and regular rhythm.   No murmur heard. Pulmonary/Chest: Effort normal and breath sounds normal. No respiratory distress.  Abdominal: Soft. Bowel sounds are normal. She exhibits no distension. There is no tenderness. There is no rebound and no guarding.    Musculoskeletal: She exhibits no edema.  Neurological: She is alert.  Skin: Skin is warm and dry.  Psychiatric: She has a normal mood and affect. Her behavior is normal. Thought content normal.  Nursing note and vitals reviewed.    ED Treatments / Results  Labs (all labs ordered are listed, but only abnormal results are displayed) Labs Reviewed  BASIC METABOLIC PANEL - Abnormal; Notable for the following:       Result Value   Glucose, Bld 100 (*)    All other components within normal limits  CBC  I-STAT TROPOININ, ED    EKG  EKG Interpretation None       Radiology Dg Chest 2 View  Result Date: 03/14/2016 CLINICAL DATA:  Shortness of breath with cardiac palpitations EXAM: CHEST  2 VIEW COMPARISON:  February 10, 2007 FINDINGS: Lungs are clear. Heart size and pulmonary vascularity are normal. No adenopathy. There is postoperative change in the lower cervical region. IMPRESSION: No edema or consolidation. Electronically Signed   By: Lowella Grip III M.D.   On: 03/14/2016 09:52    Procedures Procedures (including critical care time)  Medications Ordered in ED Medications - No data to display   Initial Impression / Assessment and Plan / ED Course  I have reviewed the triage vital signs and the nursing notes.  Pertinent labs & imaging results that were available during my care of the patient were reviewed by me and considered in my medical decision making (see chart for details).  Clinical Course    Victoria Hall is a 45 year old female presenting to the ED with 5 minutes of on and off palpitations lasting 5 minutes this morning. This happened once before when she was on Flexeril. The palpitations were associated with shortness of breath and lightheadedness. She denies taking any new meds. Unclear etiology. Pt states she has had her thyroid checked recently by her PCP and it was normal. CBC and BMP completely within normal limits. Trop 0.00. EKG with normal sinus rhythm. CXR  negative. Think patient may benefit from outpatient Holter monitor. I discussed with her that she should talk to her PCP about this. She is safe for discharge home with PCP follow-up.  Final Clinical Impressions(s) / ED Diagnoses   Final diagnoses:  None    New Prescriptions New Prescriptions   No medications on file     Sela Hua, MD 03/14/16 [place], MD 03/17/16 [phone]

## 2016-03-15 ENCOUNTER — Ambulatory Visit: Payer: BLUE CROSS/BLUE SHIELD | Admitting: Physical Therapy

## 2016-03-20 ENCOUNTER — Ambulatory Visit: Payer: BLUE CROSS/BLUE SHIELD | Admitting: Physical Therapy

## 2016-03-21 DIAGNOSIS — R102 Pelvic and perineal pain: Secondary | ICD-10-CM | POA: Diagnosis not present

## 2016-03-21 DIAGNOSIS — R300 Dysuria: Secondary | ICD-10-CM | POA: Diagnosis not present

## 2016-03-21 DIAGNOSIS — N3010 Interstitial cystitis (chronic) without hematuria: Secondary | ICD-10-CM | POA: Diagnosis not present

## 2016-03-22 ENCOUNTER — Other Ambulatory Visit: Payer: Self-pay | Admitting: Family Medicine

## 2016-03-22 ENCOUNTER — Ambulatory Visit: Payer: BLUE CROSS/BLUE SHIELD | Admitting: Physical Therapy

## 2016-03-22 DIAGNOSIS — Z1231 Encounter for screening mammogram for malignant neoplasm of breast: Secondary | ICD-10-CM

## 2016-03-23 ENCOUNTER — Ambulatory Visit
Admission: RE | Admit: 2016-03-23 | Discharge: 2016-03-23 | Disposition: A | Discharge status: Home / Self Care | Payer: BLUE CROSS/BLUE SHIELD | Source: Ambulatory Visit | Attending: Family Medicine | Admitting: Family Medicine

## 2016-03-23 DIAGNOSIS — S76011A Strain of muscle, fascia and tendon of right hip, initial encounter: Secondary | ICD-10-CM | POA: Diagnosis not present

## 2016-03-23 DIAGNOSIS — M25551 Pain in right hip: Secondary | ICD-10-CM

## 2016-03-23 MED ORDER — IOPAMIDOL (ISOVUE-M 200) INJECTION 41%
15.0000 mL | Freq: Once | INTRAMUSCULAR | Status: AC
  Administered 2016-03-23: 15 mL via INTRA_ARTICULAR

## 2016-03-23 MED ORDER — IOPAMIDOL (ISOVUE-M 200) INJECTION 41%: 1.0000 mL | Freq: Once | INTRAMUSCULAR | Status: DC

## 2016-03-23 MED ORDER — METHYLPREDNISOLONE ACETATE 40 MG/ML INJ SUSP (RADIOLOG: 120.0000 mg | Freq: Once | INTRAMUSCULAR | Status: DC

## 2016-03-24 DIAGNOSIS — R102 Pelvic and perineal pain: Secondary | ICD-10-CM | POA: Diagnosis not present

## 2016-03-28 ENCOUNTER — Encounter: Payer: Self-pay | Admitting: Neurology

## 2016-03-28 ENCOUNTER — Ambulatory Visit (INDEPENDENT_AMBULATORY_CARE_PROVIDER_SITE_OTHER): Payer: BLUE CROSS/BLUE SHIELD | Admitting: Neurology

## 2016-03-28 VITALS — BP 102/70 | HR 99 | Ht 65.0 in | Wt 177.0 lb

## 2016-03-28 DIAGNOSIS — R29818 Other symptoms and signs involving the nervous system: Secondary | ICD-10-CM | POA: Diagnosis not present

## 2016-03-28 DIAGNOSIS — M5010 Cervical disc disorder with radiculopathy, unspecified cervical region: Secondary | ICD-10-CM | POA: Diagnosis not present

## 2016-03-28 NOTE — Patient Instructions (Signed)
One option for the neck pain and cramps would be gabapentin I think the neck and arm pain are chronic unfortunately and would involve pain management.  Try the methocarbamol to see if that helps.  If ineffective, you can retry the Flexeril again.   Follow up as needed.
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
## 2016-03-30 ENCOUNTER — Encounter: Payer: Self-pay | Admitting: Family Medicine

## 2016-03-30 DIAGNOSIS — R3129 Other microscopic hematuria: Secondary | ICD-10-CM

## 2016-04-07 ENCOUNTER — Telehealth: Payer: Self-pay | Admitting: *Deleted

## 2016-04-07 NOTE — Telephone Encounter (Signed)
Spoke to patient and told her that the application for the placard was ready to be picked up.

## 2016-04-07 NOTE — Telephone Encounter (Signed)
That would be fine - 3 month placard

## 2016-04-07 NOTE — Telephone Encounter (Signed)
Spoke to patient to give her appointment information. Patient stated she is using a cane when up and about and wanted to know about getting a temporary handicap placard.

## 2016-04-11 DIAGNOSIS — R3989 Other symptoms and signs involving the genitourinary system: Secondary | ICD-10-CM | POA: Diagnosis not present

## 2016-04-11 DIAGNOSIS — Z6828 Body mass index (BMI) 28.0-28.9, adult: Secondary | ICD-10-CM | POA: Diagnosis not present

## 2016-04-11 DIAGNOSIS — R3129 Other microscopic hematuria: Secondary | ICD-10-CM | POA: Diagnosis not present

## 2016-04-11 DIAGNOSIS — R319 Hematuria, unspecified: Secondary | ICD-10-CM | POA: Diagnosis not present

## 2016-04-12 ENCOUNTER — Telehealth: Payer: Self-pay | Admitting: Family Medicine

## 2016-04-12 NOTE — Telephone Encounter (Signed)
Received records from [facility]- appears to be images from a pelvic US.  I am not able to interpret these or see them clearly- will shred

## 2016-04-21 ENCOUNTER — Ambulatory Visit
Admission: RE | Admit: 2016-04-21 | Discharge: 2016-04-21 | Disposition: A | Discharge status: Home / Self Care | Payer: BLUE CROSS/BLUE SHIELD | Source: Ambulatory Visit | Attending: Family Medicine | Admitting: Family Medicine

## 2016-04-21 DIAGNOSIS — Z1231 Encounter for screening mammogram for malignant neoplasm of breast: Secondary | ICD-10-CM | POA: Diagnosis not present

## 2016-04-24 DIAGNOSIS — R319 Hematuria, unspecified: Secondary | ICD-10-CM | POA: Diagnosis not present

## 2016-04-24 DIAGNOSIS — R3989 Other symptoms and signs involving the genitourinary system: Secondary | ICD-10-CM | POA: Diagnosis not present

## 2016-04-25 ENCOUNTER — Encounter: Payer: Self-pay | Admitting: Family Medicine

## 2016-04-25 DIAGNOSIS — G8929 Other chronic pain: Secondary | ICD-10-CM

## 2016-04-25 DIAGNOSIS — R102 Pelvic and perineal pain: Principal | ICD-10-CM

## 2016-04-25 NOTE — Addendum Note (Signed)
Addended by: Lamar Blinks C on: 04/25/2016 06:16 PM   Modules accepted: Orders

## 2016-05-16 DIAGNOSIS — M25551 Pain in right hip: Secondary | ICD-10-CM | POA: Diagnosis not present

## 2016-05-16 DIAGNOSIS — M1611 Unilateral primary osteoarthritis, right hip: Secondary | ICD-10-CM | POA: Diagnosis not present

## 2016-05-29 ENCOUNTER — Encounter: Payer: Self-pay | Admitting: Family Medicine

## 2016-05-29 ENCOUNTER — Ambulatory Visit (INDEPENDENT_AMBULATORY_CARE_PROVIDER_SITE_OTHER): Payer: BLUE CROSS/BLUE SHIELD | Admitting: Family Medicine

## 2016-05-29 VITALS — BP 120/83 | HR 89 | Temp 98.4°F | Ht 65.0 in | Wt 181.6 lb

## 2016-05-29 DIAGNOSIS — R5383 Other fatigue: Secondary | ICD-10-CM

## 2016-05-29 DIAGNOSIS — R002 Palpitations: Secondary | ICD-10-CM | POA: Diagnosis not present

## 2016-05-29 DIAGNOSIS — M25512 Pain in left shoulder: Secondary | ICD-10-CM

## 2016-05-29 DIAGNOSIS — G8929 Other chronic pain: Secondary | ICD-10-CM | POA: Diagnosis not present

## 2016-05-29 DIAGNOSIS — R5381 Other malaise: Secondary | ICD-10-CM | POA: Diagnosis not present
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
## 2016-05-29 NOTE — Patient Instructions (Signed)
It was good to see you today.  We will get you set up for a 48 hour heart monitor- let me know if you do not hear about this appt soon I would suggest that you see Dr. Barbaraann Barthel about your shoulder.  You may have a problem with your rotator cuff tendon

## 2016-05-30 DIAGNOSIS — M25551 Pain in right hip: Secondary | ICD-10-CM | POA: Diagnosis not present

## 2016-06-02 ENCOUNTER — Encounter: Payer: Self-pay | Admitting: Family Medicine

## 2016-06-02 ENCOUNTER — Other Ambulatory Visit: Payer: Self-pay | Admitting: *Deleted

## 2016-06-02 ENCOUNTER — Ambulatory Visit (INDEPENDENT_AMBULATORY_CARE_PROVIDER_SITE_OTHER): Payer: BLUE CROSS/BLUE SHIELD | Admitting: Family Medicine

## 2016-06-02 DIAGNOSIS — M25512 Pain in left shoulder: Secondary | ICD-10-CM

## 2016-06-02 DIAGNOSIS — M25561 Pain in right knee: Secondary | ICD-10-CM | POA: Diagnosis not present

## 2016-06-02 NOTE — Patient Instructions (Signed)
Your shoulder pain is due to instability and impingement. Try to avoid painful activities (overhead activities, lifting with extended arm) as much as possible. Can take aleve or ibuprofen as needed but this has been hard on your stomach. Can take tylenol in addition to this. Injections are an option (combination into the joint and where the impingement is) but typically the exercises are the most important part of your treatment. Consider physical therapy with transition to home exercise program. Do home exercise program with theraband and scapular stabilization exercises daily - 3 sets of 10 once a day. If not improving at follow-up we will consider imaging, injection, physical therapy.  Your knee pain is due to arthritis. These are the different medicines you can take for this: Tylenol 500mg  1-2 tabs three times a day for pain. Aleve 1-2 tabs twice a day with food Glucosamine sulfate 750mg  twice a day is a supplement that may help. Capsaicin, aspercreme, or biofreeze topically up to four times a day may also help with pain. Cortisone injections are an option. If cortisone injections do not help, there are different types of shots that may help but they take longer to take effect. It's important that you continue to stay active. Straight leg raises, knee extensions 3 sets of 10 once a day (add ankle weight if these become too easy). Consider physical therapy to strengthen muscles around the joint that hurts to take pressure off of the joint itself. Shoe inserts with good arch support may be helpful. Walker or cane if needed. Heat 15 minutes at a time 3-4 times a day as needed to help with pain. Water aerobics and cycling with low resistance are the best two types of exercise for arthritis. Follow up with me in 5-6 weeks.

## 2016-06-05 DIAGNOSIS — M25561 Pain in right knee: Secondary | ICD-10-CM | POA: Insufficient documentation

## 2016-06-05 DIAGNOSIS — M25512 Pain in left shoulder: Secondary | ICD-10-CM | POA: Insufficient documentation

## 2016-06-05 DIAGNOSIS — Z6829 Body mass index (BMI) 29.0-29.9, adult: Secondary | ICD-10-CM | POA: Diagnosis not present

## 2016-06-05 DIAGNOSIS — G8929 Other chronic pain: Secondary | ICD-10-CM | POA: Diagnosis not present

## 2016-06-05 DIAGNOSIS — R102 Pelvic and perineal pain: Secondary | ICD-10-CM | POA: Diagnosis not present

## 2016-06-05 DIAGNOSIS — R3989 Other symptoms and signs involving the genitourinary system: Secondary | ICD-10-CM | POA: Diagnosis not present

## 2016-06-05 NOTE — Progress Notes (Signed)
PCP: COPLAND,JESSICA, MD  Subjective:   HPI: Patient is a 46 y.o. female here for right knee, left shoulder pain.  Patient reports for several weeks she has had anterior right knee pain. Associated feeling of the knee going to give out. Occasionally taking ibuprofen. Pain level 4/10, sharp. Worse with ambulation. Also having left shoulder pain. No known injury. Feels like this shoulder is loose as well. Worse with reaching up and out to the side. No skin changes, numbness.  Past Medical History:  Diagnosis Date  . Anxiety   . Arthritis   . Cervical intraepithelial neoplasia grade 3 2002   s/p LEEP Rx repeat pap negative  . Cervical radiculopathy   . Fallopian tube disorder    right fallopian tube removed  . Gallstones 09/2015   On CT  . Headache    Migraines  . Hemorrhagic ovarian cyst 02/08/2016  . Hydrosalpinx    [facility]  . Hydrosalpinx 02/08/2016  . Interstitial cystitis   . Neurological abnormality    Neg workup with MRI/MRA in 2007 WNL except decreased caliber MCA proximally, distal cavernous portion of left LCA which may represent true stenosis or technique on exam. Evaluated by Dr Linus Salmons.  . Palpitations   . Partial seizures (HCC)    questionable diag  . Pneumonia    walking pneumonia  . Polycystic ovary    multiple ovarian cysts removed  . S/P cervical discectomy    Dr Vertell Limber, anterior discectomy C5-C7  . S/P epidural steroid injection    Right hip  . Seizures ([place]) 2005   simple partial seizure disorder    Current Outpatient Prescriptions on File Prior to Visit  Medication Sig Dispense Refill  . Multiple Vitamins-Minerals (WOMENS MULTIVITAMIN PO) Take 1 tablet by mouth daily.     No current facility-administered medications on file prior to visit.     Past Surgical History:  Procedure Laterality Date  . APPENDECTOMY  1986  . BUNIONECTOMY Left    bunion removal  . CERVICAL DISC SURGERY  2005   C5-C7  . COLONOSCOPY    .  DENTAL SURGERY    . fallopian tue removed    . LAPAROSCOPIC BILATERAL SALPINGECTOMY Left 02/09/2016   Procedure: LAPAROSCOPIC LEFT  SALPINGECTOMY;  Surgeon: Janyth Contes, MD;  Location: [facility];  Service: Gynecology;  Laterality: Left;  . LAPAROSCOPIC LYSIS OF ADHESIONS  02/09/2016   Procedure: LAPAROSCOPIC LYSIS OF ADHESIONS;  Surgeon: Janyth Contes, MD;  Location: [facility];  Service: Gynecology;;  momentum to adenexa  . LAPAROSCOPIC OVARIAN CYSTECTOMY Right 02/09/2016   Procedure: LAPAROSCOPIC OVARIAN CYSTECTOMY;  Surgeon: Janyth Contes, MD;  Location: [facility];  Service: Gynecology;  Laterality: Right;  x2 to right ovary  . LEEP  1998  . OOPHORECTOMY Left 02/09/2016   Procedure: LEFT OOPHORECTOMY;  Surgeon: Janyth Contes, MD;  Location: [facility];  Service: Gynecology;  Laterality: Left;  . TONSILLECTOMY  2001  . UPPER GI ENDOSCOPY      Allergies  Allergen Reactions  . Codeine Nausea And Vomiting  . Cyclobenzaprine Other (See Comments)    Fast heartbeat, restless leg, nightmares  . Xanax [Alprazolam]     Bruising    Social History   Social History  . Marital status: Divorced    Spouse name: N/A  . Number of children: 0  . 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 Smoking status: Former Smoker    Packs/day: 0.50    Years: 10.00    Types: Cigarettes    Quit date: 11/02/2015  . Smokeless tobacco: Never Used     Comment: currently e cigarettes only  . Alcohol use 0.6 - 1.2 oz/week    1 - 2 Standard drinks or equivalent per week     Comment: occasionally 0-2 per day  . Drug use: No  . Sexual activity: Yes   Other Topics Concern  . Not on file   Social History Narrative  . No narrative on file    Family History  Problem Relation Age of Onset  . Adopted: Yes    BP 125/78   Pulse 73   Ht 5\' 5"  (1.651 m)   Wt 180 lb  (81.6 kg)   LMP 05/17/2016   BMI 29.95 kg/m   Review of Systems: See HPI above.     Objective:  Physical Exam:  Gen: NAD, comfortable in exam room  Right knee: No gross deformity, ecchymoses, swelling. TTP lateral joint line. FROM. Negative ant/post drawers. Negative valgus/varus testing. Negative lachmanns. Negative mcmurrays, apleys, patellar apprehension. NV intact distally.  Left knee: FROM without pain.  Left shoulder: No swelling, ecchymoses.  No gross deformity. No TTP. FROM with painful arc. Positive Hawkins, Neers. Negative Yergasons. Strength 5/5 with empty can and resisted internal/external rotation. Negative apprehension. Positive sulcus. Positive o'briens.  NV intact distally.  Right shoulder: FROM without pain.   Assessment & Plan:  1. Right knee pain - consistent with arthritis.  Discussed tylenol, aleve, topical medications.  Home exercises reviewed.  Heat as needed.  Consider injection if not improving.  F/u in 5-6 weeks.  2. Left shoulder pain - 2/2 impingement and instability.  Shown home exercises to do daily.  Aleve/ibuprofen, tylenol if needed.  Consider injection, physical therapy if not improving.

## 2016-06-05 NOTE — Assessment & Plan Note (Signed)
2/2 impingement and instability.  Shown home exercises to do daily.  Aleve/ibuprofen, tylenol if needed.  Consider injection, physical therapy if not improving.

## 2016-06-05 NOTE — Assessment & Plan Note (Signed)
consistent with arthritis.  Discussed tylenol, aleve, topical medications.  Home exercises reviewed.  Heat as needed.  Consider injection if not improving.  F/u in 5-6 weeks.

## 2016-06-06 ENCOUNTER — Ambulatory Visit (INDEPENDENT_AMBULATORY_CARE_PROVIDER_SITE_OTHER): Payer: BLUE CROSS/BLUE SHIELD

## 2016-06-06 ENCOUNTER — Ambulatory Visit: Payer: BLUE CROSS/BLUE SHIELD | Admitting: Neurology

## 2016-06-06 DIAGNOSIS — R002 Palpitations: Secondary | ICD-10-CM | POA: Diagnosis not present

## 2016-06-06 DIAGNOSIS — R011 Cardiac murmur, unspecified: Secondary | ICD-10-CM | POA: Diagnosis not present

## 2016-06-08 DIAGNOSIS — M2550 Pain in unspecified joint: Secondary | ICD-10-CM | POA: Diagnosis not present

## 2016-06-08 DIAGNOSIS — R768 Other specified abnormal immunological findings in serum: Secondary | ICD-10-CM | POA: Diagnosis not present

## 2016-06-08 DIAGNOSIS — M159 Polyosteoarthritis, unspecified: Secondary | ICD-10-CM | POA: Diagnosis not present

## 2016-06-09 ENCOUNTER — Ambulatory Visit: Payer: BLUE CROSS/BLUE SHIELD | Admitting: Family Medicine

## 2016-06-14 ENCOUNTER — Ambulatory Visit (HOSPITAL_COMMUNITY)
Admission: RE | Admit: 2016-06-14 | Discharge: 2016-06-14 | Disposition: A | Discharge status: Home / Self Care | Payer: BLUE CROSS/BLUE SHIELD | Source: Ambulatory Visit | Attending: Nurse Practitioner | Admitting: Nurse Practitioner

## 2016-06-14 ENCOUNTER — Encounter (HOSPITAL_COMMUNITY): Payer: Self-pay | Admitting: Nurse Practitioner

## 2016-06-14 ENCOUNTER — Encounter (HOSPITAL_COMMUNITY): Payer: Self-pay

## 2016-06-14 DIAGNOSIS — I4510 Unspecified right bundle-branch block: Secondary | ICD-10-CM | POA: Insufficient documentation

## 2016-06-14 DIAGNOSIS — R002 Palpitations: Secondary | ICD-10-CM | POA: Diagnosis not present

## 2016-06-14 DIAGNOSIS — I517 Cardiomegaly: Secondary | ICD-10-CM | POA: Insufficient documentation

## 2016-06-14 DIAGNOSIS — I498 Other specified cardiac arrhythmias: Secondary | ICD-10-CM | POA: Insufficient documentation

## 2016-06-15 ENCOUNTER — Ambulatory Visit (HOSPITAL_COMMUNITY): Payer: BLUE CROSS/BLUE SHIELD | Admitting: Nurse Practitioner

## 2016-06-16 DIAGNOSIS — G8929 Other chronic pain: Secondary | ICD-10-CM | POA: Diagnosis not present

## 2016-06-16 DIAGNOSIS — R102 Pelvic and perineal pain: Secondary | ICD-10-CM | POA: Diagnosis not present

## 2016-06-19 ENCOUNTER — Telehealth (HOSPITAL_COMMUNITY): Payer: Self-pay | Admitting: *Deleted

## 2016-06-19 NOTE — Telephone Encounter (Signed)
Patient notified of monitor results. Per Roderic Palau NP no medications are recommended at this time as monitor was reassuring for PAC/PVCs no afib detected. Pt prefers to "watch and wait" for now since rhythm is benign. She will inform her PCP if frequency increases and at that time would recommend general cardiology establishment. Pt was very grateful of call and recommendations.

## 2016-06-26 ENCOUNTER — Telehealth: Payer: Self-pay | Admitting: *Deleted

## 2016-06-26 NOTE — Telephone Encounter (Signed)
Patient called and wanting to know if we can re-issue a temporary placard form (3 months). Still having problems walking.

## 2016-06-26 NOTE — Telephone Encounter (Signed)
[place] for one more.  If she's still having problems necessitating one then I would encourage her to discuss permanent one with her PCP.

## 2016-07-05 ENCOUNTER — Other Ambulatory Visit: Payer: Self-pay | Admitting: Physician Assistant

## 2016-07-05 DIAGNOSIS — G8929 Other chronic pain: Secondary | ICD-10-CM | POA: Diagnosis not present

## 2016-07-05 DIAGNOSIS — T63301A Toxic effect of unspecified spider venom, accidental (unintentional), initial encounter: Secondary | ICD-10-CM

## 2016-07-05 DIAGNOSIS — R102 Pelvic and perineal pain: Secondary | ICD-10-CM | POA: Diagnosis not present

## 2016-07-05 DIAGNOSIS — M62838 Other muscle spasm: Secondary | ICD-10-CM | POA: Diagnosis not present

## 2016-07-05 DIAGNOSIS — N72 Inflammatory disease of cervix uteri: Secondary | ICD-10-CM | POA: Diagnosis not present

## 2016-07-14 ENCOUNTER — Ambulatory Visit (INDEPENDENT_AMBULATORY_CARE_PROVIDER_SITE_OTHER): Payer: BLUE CROSS/BLUE SHIELD | Admitting: Family Medicine

## 2016-07-14 ENCOUNTER — Encounter: Payer: Self-pay | Admitting: Family Medicine

## 2016-07-14 DIAGNOSIS — M25512 Pain in left shoulder: Secondary | ICD-10-CM

## 2016-07-14 DIAGNOSIS — M25561 Pain in right knee: Secondary | ICD-10-CM | POA: Diagnosis not present

## 2016-07-14 DIAGNOSIS — G8929 Other chronic pain: Secondary | ICD-10-CM | POA: Diagnosis not present

## 2016-07-14 NOTE — Patient Instructions (Signed)
Let me know if you want to do physical therapy for your shoulders/rhomboids. Or if you want to do a shot in your right knee. Make a 30 minute appointment for [place] to go over your back pain. Try the flexeril but only take half a tablet 3 times a day at first to make sure you don't have a reaction. Prednisone dose pack is an option as well.

## 2016-07-18 ENCOUNTER — Encounter: Payer: Self-pay | Admitting: Family Medicine

## 2016-07-18 ENCOUNTER — Ambulatory Visit (INDEPENDENT_AMBULATORY_CARE_PROVIDER_SITE_OTHER): Payer: BLUE CROSS/BLUE SHIELD | Admitting: Family Medicine

## 2016-07-18 DIAGNOSIS — G8929 Other chronic pain: Secondary | ICD-10-CM

## 2016-07-18 DIAGNOSIS — M545 Low back pain: Secondary | ICD-10-CM | POA: Diagnosis not present

## 2016-07-18 NOTE — Patient Instructions (Signed)
I don't have access to the lab where you had the HLA B27 test done. I would revisit a rheumatologic cause of your symptoms if this was positive and the ANA. Your lumbar spine MRI looked good - there was no evidence of nerve impingement or severe enough arthritis to cause your pain. Try the tizanidine you have at home for symptoms. Let me know if you want to try physical therapy again, nortriptyline. Keep me updated after your visit with Dr. Aretha Parrot and potentially rheumatology again. I think there's more going on here than a pure orthopedic cause of your pain issues.

## 2016-07-20 NOTE — Assessment & Plan Note (Signed)
2/2 impingement and instability.  Continue home exercises - she will add flexeril.  Consider physical therapy as well.  f/u in 6 weeks.

## 2016-07-20 NOTE — Assessment & Plan Note (Signed)
consistent with arthritis.  No real changes compared to last visit.  Discussed tylenol, aleve, topical medications.  Home exercises reviewed.  Heat as needed.  She will consider injection.  F/u in 6 weeks otherwise.

## 2016-07-20 NOTE — Progress Notes (Signed)
PCP: COPLAND,JESSICA, MD  Subjective:   HPI: Patient is a 46 y.o. female here for right knee, left shoulder pain.  3/2: Patient reports for several weeks she has had anterior right knee pain. Associated feeling of the knee going to give out. Occasionally taking ibuprofen. Pain level 4/10, sharp. Worse with ambulation. Also having left shoulder pain. No known injury. Feels like this shoulder is loose as well. Worse with reaching up and out to the side. No skin changes, numbness.  4/13: Patient reports her shoulder still bother her quite a bit especially posteriorly. Pain is 1/10 at rest on left, up to 8/10 and sharp; Pain 0/10 in right, up to 4/10 at worst. Hurts to take a deep breath. Right knee has been resting a lot - pain 1/10 but up to 7/10 and sharp at times. Still feels unstable. Had some improvement so got a job that involved standing and walking - pain worsened with this though. Feels worse doing home exercises too. No skin changes. Reports some numbness into left arm and fingers.  Past Medical History:  Diagnosis Date  . Anxiety   . Arthritis   . Cervical intraepithelial neoplasia grade 3 2002   s/p LEEP Rx repeat pap negative  . Cervical radiculopathy   . Fallopian tube disorder    right fallopian tube removed  . Gallstones 09/2015   On CT  . Headache    Migraines  . Hemorrhagic ovarian cyst 02/08/2016  . Hydrosalpinx    [facility]  . Hydrosalpinx 02/08/2016  . Interstitial cystitis   . Neurological abnormality    Neg workup with MRI/MRA in 2007 WNL except decreased caliber MCA proximally, distal cavernous portion of left LCA which may represent true stenosis or technique on exam. Evaluated by Dr Linus Salmons.  . Palpitations   . Partial seizures (HCC)    questionable diag  . Pneumonia    walking pneumonia  . Polycystic ovary    multiple ovarian cysts removed  . S/P cervical discectomy    Dr Vertell Limber, anterior discectomy C5-C7  . S/P  epidural steroid injection    Right hip  . Seizures ([place]) 2005   simple partial seizure disorder    Current Outpatient Prescriptions on File Prior to Visit  Medication Sig Dispense Refill  . gabapentin (NEURONTIN) 100 MG capsule Take 100 mg by mouth as needed.    . Multiple Vitamins-Minerals (WOMENS MULTIVITAMIN PO) Take 1 tablet by mouth daily.     No current facility-administered medications on file prior to visit.     Past Surgical History:  Procedure Laterality Date  . APPENDECTOMY  1986  . BUNIONECTOMY Left    bunion removal  . CERVICAL DISC SURGERY  2005   C5-C7  . COLONOSCOPY    . DENTAL SURGERY    . fallopian tue removed    . LAPAROSCOPIC BILATERAL SALPINGECTOMY Left 02/09/2016   Procedure: LAPAROSCOPIC LEFT  SALPINGECTOMY;  Surgeon: Janyth Contes, MD;  Location: [facility];  Service: Gynecology;  Laterality: Left;  . LAPAROSCOPIC LYSIS OF ADHESIONS  02/09/2016   Procedure: LAPAROSCOPIC LYSIS OF ADHESIONS;  Surgeon: Janyth Contes, MD;  Location: [facility];  Service: Gynecology;;  momentum to adenexa  . LAPAROSCOPIC OVARIAN CYSTECTOMY Right 02/09/2016   Procedure: LAPAROSCOPIC OVARIAN CYSTECTOMY;  Surgeon: Janyth Contes, MD;  Location: [facility];  Service: Gynecology;  Laterality: Right;  x2 to right ovary  . LEEP  1998  . OOPHORECTOMY Left 02/09/2016   Procedure: LEFT OOPHORECTOMY;  Surgeon: Janyth Contes, MD;  Location: [facility];  Service: Gynecology;  Laterality: Left;  . TONSILLECTOMY  2001  . UPPER GI ENDOSCOPY      Allergies  Allergen Reactions  . Codeine Nausea And Vomiting  . Cyclobenzaprine Other (See Comments)    Fast heartbeat, restless leg, nightmares  . Xanax [Alprazolam]     Bruising    Social History   Social History  . Marital status: Divorced    Spouse name: N/A  . Number of children: 0  . 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 Smoking status: Former Smoker    Packs/day: 0.50    Years: 10.00    Types: Cigarettes    Quit date: 11/02/2015  . Smokeless tobacco: Never Used     Comment: currently e cigarettes only  . Alcohol use 0.6 - 1.2 oz/week    1 - 2 Standard drinks or equivalent per week     Comment: occasionally 0-2 per day  . Drug use: No  . Sexual activity: Yes   Other Topics Concern  . Not on file   Social History Narrative  . No narrative on file    Family History  Problem Relation Age of Onset  . Adopted: Yes    BP 105/64   Pulse 96   Ht 5\' 5"  (1.651 m)   Wt 180 lb (81.6 kg)   BMI 29.95 kg/m   Review of Systems: See HPI above.     Objective:  Physical Exam:  Gen: NAD, comfortable in exam room  Right knee: No gross deformity, ecchymoses, swelling. TTP lateral joint line. FROM. Negative ant/post drawers. Negative valgus/varus testing. Negative lachmanns. Negative mcmurrays, apleys, patellar apprehension. NV intact distally.  Left knee: FROM without pain.  Left shoulder: No swelling, ecchymoses.  No gross deformity. TTP medial to bilateral scapulae. FROM with painful arc. Positive Hawkins, Neers. Negative Yergasons. Strength 5/5 with empty can and resisted internal/external rotation. Negative apprehension. NV intact distally.  Assessment & Plan:  1. Right knee pain - consistent with arthritis.  No real changes compared to last visit.  Discussed tylenol, aleve, topical medications.  Home exercises reviewed.  Heat as needed.  She will consider injection.  F/u in 6 weeks otherwise.  2. Left shoulder pain - 2/2 impingement and instability.  Continue home exercises - she will add flexeril.  Consider physical therapy as well.  f/u in 6 weeks.

## 2016-07-21 ENCOUNTER — Encounter: Payer: Self-pay | Admitting: Family Medicine

## 2016-07-21 DIAGNOSIS — M545 Low back pain, unspecified: Secondary | ICD-10-CM | POA: Insufficient documentation

## 2016-07-21 NOTE — Progress Notes (Signed)
PCP: COPLAND,JESSICA, MD  Subjective:   HPI: Patient is a 46 y.o. female here for low back pain.  Patient reports she's had over 5 months of mid to low back pain. Pain is sharp, 5/10 level. Will come and go though the achy part of pain is constant. No bowel/bladder dysfunction. Can go down both legs. She previously tried amitriptyline, gabapentin and had bad reactions to both - sleepiness, cognitive delay, headaches. She had a lumbar spine MRI October 2017 showing mild bulges at L3-4 and L4-5 without narrowing.  Some facet arthropathy at L5-S1 also without narrowing.  Past Medical History:  Diagnosis Date  . Anxiety   . Arthritis   . Cervical intraepithelial neoplasia grade 3 2002   s/p LEEP Rx repeat pap negative  . Cervical radiculopathy   . Fallopian tube disorder    right fallopian tube removed  . Gallstones 09/2015   On CT  . Headache    Migraines  . Hemorrhagic ovarian cyst 02/08/2016  . Hydrosalpinx    [facility]  . Hydrosalpinx 02/08/2016  . Interstitial cystitis   . Neurological abnormality    Neg workup with MRI/MRA in 2007 WNL except decreased caliber MCA proximally, distal cavernous portion of left LCA which may represent true stenosis or technique on exam. Evaluated by Dr Linus Salmons.  . Palpitations   . Partial seizures (HCC)    questionable diag  . Pneumonia    walking pneumonia  . Polycystic ovary    multiple ovarian cysts removed  . S/P cervical discectomy    Dr Vertell Limber, anterior discectomy C5-C7  . S/P epidural steroid injection    Right hip  . Seizures ([place]) 2005   simple partial seizure disorder    Current Outpatient Prescriptions on File Prior to Visit  Medication Sig Dispense Refill  . diazepam (VALIUM) 2 MG tablet Take 2 mg by mouth.    . gabapentin (NEURONTIN) 100 MG capsule Take 100 mg by mouth as needed.    . Multiple Vitamins-Minerals (WOMENS MULTIVITAMIN PO) Take 1 tablet by mouth daily.     No current facility-administered  medications on file prior to visit.     Past Surgical History:  Procedure Laterality Date  . APPENDECTOMY  1986  . BUNIONECTOMY Left    bunion removal  . CERVICAL DISC SURGERY  2005   C5-C7  . COLONOSCOPY    . DENTAL SURGERY    . fallopian tue removed    . LAPAROSCOPIC BILATERAL SALPINGECTOMY Left 02/09/2016   Procedure: LAPAROSCOPIC LEFT  SALPINGECTOMY;  Surgeon: Janyth Contes, MD;  Location: [facility];  Service: Gynecology;  Laterality: Left;  . LAPAROSCOPIC LYSIS OF ADHESIONS  02/09/2016   Procedure: LAPAROSCOPIC LYSIS OF ADHESIONS;  Surgeon: Janyth Contes, MD;  Location: [facility];  Service: Gynecology;;  momentum to adenexa  . LAPAROSCOPIC OVARIAN CYSTECTOMY Right 02/09/2016   Procedure: LAPAROSCOPIC OVARIAN CYSTECTOMY;  Surgeon: Janyth Contes, MD;  Location: [facility];  Service: Gynecology;  Laterality: Right;  x2 to right ovary  . LEEP  1998  . OOPHORECTOMY Left 02/09/2016   Procedure: LEFT OOPHORECTOMY;  Surgeon: Janyth Contes, MD;  Location: [facility];  Service: Gynecology;  Laterality: Left;  . TONSILLECTOMY  2001  . UPPER GI ENDOSCOPY      Allergies  Allergen Reactions  . Codeine Nausea And Vomiting  . Cyclobenzaprine Other (See Comments)    Fast heartbeat, restless leg, nightmares  . Xanax [Alprazolam]     Bruising    Social History   Social History  .  Marital status: Divorced    Spouse name: N/A  . Number of children: 0  . 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 Smoking status: Former Smoker    Packs/day: 0.50    Years: 10.00    Types: Cigarettes    Quit date: 11/02/2015  . Smokeless tobacco: Never Used     Comment: currently e cigarettes only  . Alcohol use 0.6 - 1.2 oz/week    1 - 2 Standard drinks or equivalent per week     Comment: occasionally 0-2 per day  . Drug use: No  . Sexual activity: Yes    Other Topics Concern  . Not on file   Social History Narrative  . No narrative on file    Family History  Problem Relation Age of Onset  . Adopted: Yes    BP 114/85   Pulse (!) 106   Ht _0  (1.651 m)   Wt 180 lb (81.6 kg)   BMI 29.95 kg/m   Review of Systems: See HPI above.     Objective:  Physical Exam:  Gen: NAD, comfortable in exam room  Back: No gross deformity, scoliosis. TTP bilateral SI joints and in midline lumbar spine area.  No focal bony tenderness though. FROM. Strength LEs 5/5 all muscle groups.   2+ MSRs in patellar and achilles tendons, equal bilaterally. Negative SLRs. Sensation intact to light touch bilaterally. Negative logroll bilateral hips Negative fabers and piriformis stretches.  Assessment & Plan:  1. Low back pain - Her MRI last year was reassuring.  On exam she does have some SI joint tenderness but also has midline tenderness.  She reports having had a positive HLA B27 as well as ANA (I don't have access to HLA result) and multiple joint, muscle aches.  I would revisit a rheumatologic cause for her pain.  We discussed physical therapy, nortriptyline (did not tolerate amitriptyline though).  She has tizanidine at home that she will try.  Home exercises reviewed.  f/u prn for this otherwise.

## 2016-07-21 NOTE — Assessment & Plan Note (Signed)
Her MRI last year was reassuring.  On exam she does have some SI joint tenderness but also has midline tenderness.  She reports having had a positive HLA B27 as well as ANA (I don't have access to HLA result) and multiple joint, muscle aches.  I would revisit a rheumatologic cause for her pain.  We discussed physical therapy, nortriptyline (did not tolerate amitriptyline though).  She has tizanidine at home that she will try.  Home exercises reviewed.  f/u prn for this otherwise.

## 2016-07-24 NOTE — Addendum Note (Signed)
Addended by: Sherrie George F on: 07/24/2016 09:36 AM   Modules accepted: Orders

## 2016-07-25 DIAGNOSIS — M1611 Unilateral primary osteoarthritis, right hip: Secondary | ICD-10-CM | POA: Diagnosis not present

## 2016-07-30 ENCOUNTER — Encounter: Payer: Self-pay | Admitting: Family Medicine

## 2016-07-30 DIAGNOSIS — D8940 Mast cell activation, unspecified: Secondary | ICD-10-CM

## 2016-07-31 DIAGNOSIS — M2550 Pain in unspecified joint: Secondary | ICD-10-CM | POA: Diagnosis not present

## 2016-08-01 ENCOUNTER — Telehealth: Payer: Self-pay | Admitting: Gastroenterology

## 2016-08-01 NOTE — Telephone Encounter (Signed)
Patient's questions answered about pathology form 12/29/15.  She will call back for any additional questions or concerns.

## 2016-08-02 NOTE — Addendum Note (Signed)
Addended by: Lamar Blinks C on: 08/02/2016 09:03 PM   Modules accepted: Orders

## 2016-08-08 ENCOUNTER — Telehealth: Payer: Self-pay | Admitting: Physical Therapy

## 2016-08-08 NOTE — Telephone Encounter (Signed)
Left messages on 4/30 & 5/7 to schedule PT eval, no return call

## 2016-08-30 ENCOUNTER — Encounter: Payer: Self-pay | Admitting: Physical Therapy

## 2016-08-30 ENCOUNTER — Ambulatory Visit: Payer: BLUE CROSS/BLUE SHIELD | Attending: Family Medicine | Admitting: Physical Therapy

## 2016-08-30 DIAGNOSIS — M25511 Pain in right shoulder: Secondary | ICD-10-CM | POA: Diagnosis not present

## 2016-08-30 DIAGNOSIS — M25512 Pain in left shoulder: Secondary | ICD-10-CM | POA: Insufficient documentation

## 2016-08-30 DIAGNOSIS — R202 Paresthesia of skin: Secondary | ICD-10-CM

## 2016-08-30 DIAGNOSIS — M542 Cervicalgia: Secondary | ICD-10-CM | POA: Diagnosis not present

## 2016-08-30 DIAGNOSIS — G8929 Other chronic pain: Secondary | ICD-10-CM | POA: Diagnosis not present

## 2016-08-30 DIAGNOSIS — M6281 Muscle weakness (generalized): Secondary | ICD-10-CM | POA: Diagnosis not present

## 2016-08-30 NOTE — Patient Instructions (Signed)
Over Head Pull: Narrow Grip       On back, knees bent, feet flat, band across thighs, elbows straight but relaxed. Pull hands apart (start). Keeping elbows straight, bring arms up and over head, hands toward floor. Keep pull steady on band. Hold momentarily. Return slowly, keeping pull steady, back to start. Repeat __10-20_ times. Band color _____GREEN_   Side Pull: Double Arm   On back, knees bent, feet flat. Arms perpendicular to body, shoulder level, elbows straight but relaxed. Pull arms out to sides, elbows straight. Resistance band comes across collarbones, hands toward floor. Hold momentarily. Slowly return to starting position. Repeat _10-20__ times. Band color ___GREEN__    Shoulder Rotation: Double Arm   On back, knees bent, feet flat, elbows tucked at sides, bent 90, hands palms up. Pull hands apart and down toward floor, keeping elbows near sides. Hold momentarily. Slowly return to starting position. Repeat __10_ times. Band color _____GREEN_

## 2016-08-31 ENCOUNTER — Telehealth: Payer: Self-pay | Admitting: *Deleted

## 2016-08-31 NOTE — Telephone Encounter (Signed)
Received request for Medical records from [facility], forwarded to [place] for email/scan/SLS    
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
## 2016-09-05 ENCOUNTER — Ambulatory Visit: Payer: BLUE CROSS/BLUE SHIELD | Attending: Family Medicine | Admitting: Physical Therapy

## 2016-09-05 ENCOUNTER — Encounter: Payer: Self-pay | Admitting: Physical Therapy

## 2016-09-05 DIAGNOSIS — M25512 Pain in left shoulder: Secondary | ICD-10-CM | POA: Diagnosis not present

## 2016-09-05 DIAGNOSIS — M542 Cervicalgia: Secondary | ICD-10-CM | POA: Insufficient documentation

## 2016-09-05 DIAGNOSIS — M6281 Muscle weakness (generalized): Secondary | ICD-10-CM | POA: Insufficient documentation

## 2016-09-05 DIAGNOSIS — G8929 Other chronic pain: Secondary | ICD-10-CM | POA: Diagnosis not present

## 2016-09-05 DIAGNOSIS — M25511 Pain in right shoulder: Secondary | ICD-10-CM | POA: Insufficient documentation

## 2016-09-05 NOTE — Therapy (Signed)
[place], [place], [address] Phone: [phone]   Fax:  [phone]  Physical Therapy Treatment  Patient Details  Name: Victoria Hall MRN: [id number] Date of Birth: February 20, 1971 Referring Provider: Dr. Silvestre Mesi   Encounter Date: 09/05/2016      PT End of Session - 09/05/16 1331    Visit Number 2   Number of Visits 12   Date for PT Re-Evaluation 10/11/16   PT Start Time 1332   PT Stop Time 1417   PT Time Calculation (min) 45 min   Activity Tolerance Patient tolerated treatment well   Behavior During Therapy [facility] for tasks assessed/performed      Past Medical History:  Diagnosis Date  . Anxiety   . Arthritis   . Cervical intraepithelial neoplasia grade 3 2002   s/p LEEP Rx repeat pap negative  . Cervical radiculopathy   . Fallopian tube disorder    right fallopian tube removed  . Gallstones 09/2015   On CT  . Headache    Migraines  . Hemorrhagic ovarian cyst 02/08/2016  . Hydrosalpinx    [facility]  . Hydrosalpinx 02/08/2016  . Interstitial cystitis   . Neurological abnormality    Neg workup with MRI/MRA in 2007 WNL except decreased caliber MCA proximally, distal cavernous portion of left LCA which may represent true stenosis or technique on exam. Evaluated by Dr Linus Salmons.  . Palpitations   . Partial seizures (HCC)    questionable diag  . Pneumonia    walking pneumonia  . Polycystic ovary    multiple ovarian cysts removed  . S/P cervical discectomy    Dr Vertell Limber, anterior discectomy C5-C7  . S/P epidural steroid injection    Right hip  . Seizures ([place]) 2005   simple partial seizure disorder    Past Surgical History:  Procedure Laterality Date  . APPENDECTOMY  1986  . BUNIONECTOMY Left    bunion removal  . CERVICAL DISC SURGERY  2005   C5-C7  . COLONOSCOPY    . DENTAL SURGERY    . fallopian tue removed    . LAPAROSCOPIC BILATERAL SALPINGECTOMY Left  02/09/2016   Procedure: LAPAROSCOPIC LEFT  SALPINGECTOMY;  Surgeon: Janyth Contes, MD;  Location: [facility];  Service: Gynecology;  Laterality: Left;  . LAPAROSCOPIC LYSIS OF ADHESIONS  02/09/2016   Procedure: LAPAROSCOPIC LYSIS OF ADHESIONS;  Surgeon: Janyth Contes, MD;  Location: [facility];  Service: Gynecology;;  momentum to adenexa  . LAPAROSCOPIC OVARIAN CYSTECTOMY Right 02/09/2016   Procedure: LAPAROSCOPIC OVARIAN CYSTECTOMY;  Surgeon: Janyth Contes, MD;  Location: [facility];  Service: Gynecology;  Laterality: Right;  x2 to right ovary  . LEEP  1998  . OOPHORECTOMY Left 02/09/2016   Procedure: LEFT OOPHORECTOMY;  Surgeon: Janyth Contes, MD;  Location: [facility];  Service: Gynecology;  Laterality: Left;  . TONSILLECTOMY  2001  . UPPER GI ENDOSCOPY      There were no vitals filed for this visit.      Subjective Assessment - 09/05/16 1335    Subjective Recently visited neurologist and did not find any indications for seizures. Has a HA today stemming from neck. Difficulty moving neck after doing scapular exercises   Currently in Pain? Yes   Pain Score 5    Pain Location Neck   Pain Orientation Left                         [facility]  PT Treatment/Exercise - 09/05/16 0001      Exercises   Exercises Neck     Neck Exercises: Supine   Neck Retraction Limitations chin tucks     Shoulder Exercises: Supine   Other Supine Exercises iso ER press at 45 and 90 abd; ER press with movmeent at 45 abd   Other Supine Exercises ER pull on blue tband with iso ext press into table     Manual Therapy   Manual Therapy Myofascial release;Manual Traction   Myofascial Release suboccipital release, L upper trap, L scalene   Manual Traction cervical, 5x30s                PT Education - 09/05/16 1531    Education provided Yes   Education Details trigger point referral patterns, TPDN availability, exercise form/rationale, reducing stress of pain to focus on  muscular isolation   Person(s) Educated Patient   Methods Explanation;Tactile cues;Verbal cues   Comprehension Verbalized understanding;Verbal cues required;Tactile cues required          PT Short Term Goals - 08/31/16 0856      PT SHORT TERM GOAL #1   Title The patient will be indep with HEP for shoulder ROM, posture, and strength    Time 3   Period Weeks   Status New     PT SHORT TERM GOAL #2   Title Patient will be able to walk short distances without increasing shoulder pain due to strategies to stabilize L UE   Time 3   Period Weeks   Status New           PT Long Term Goals - 08/31/16 0857      PT LONG TERM GOAL #1   Title patient to be independent with HEP    Time 6   Status New     PT LONG TERM GOAL #2   Title Patient will be able to reach to 120 deg and maintain the position for 15-20 sec with pain in LUE no more than 2/10 for home tasks, painting.     Time 6   Period Weeks   Status New     PT LONG TERM GOAL #3   Title Pt will score FOTO less than 40% limited to show improvement in overall function.    Time 6   Period Weeks   Status New     PT LONG TERM GOAL #4   Title Pt wil be able to use LT arm for ADLs with min increase in pain, including reaching to lower back and behind head.    Time 6   Period Weeks   Status New               Plan - 09/05/16 1533    Clinical Impression Statement Pt with HA today that was decreased with ischemic release of scalene trigger point. Reported more comfort with cervical retractions with technique cuing. Notable anterior shift of L GHJ resulting in excessive pull from cervical musculature. Pt is planning to see immunologist regarding systemic inflammatory reactions.    PT Treatment/Interventions ADLs/Self Care Home Management;Cryotherapy;Electrical Stimulation;Moist Heat;Ultrasound;Passive range of motion;Neuromuscular re-education;Therapeutic exercise;Therapeutic activities;Manual techniques;Dry  needling;Taping;Patient/family education;Functional mobility training;Gait training;Iontophoresis 4mg /ml Dexamethasone   PT Next Visit Plan ionto? periscapular stabilization, increase HEP as tol   PT Home Exercise Plan supine scap stab; supine ext rot pull on blue tband + iso ext press into table   Consulted and Agree with Plan of Care Patient  Patient will benefit from skilled therapeutic intervention in order to improve the following deficits and impairments:  Abnormal gait, Impaired sensation, Decreased activity tolerance, Decreased strength, Pain, Impaired UE functional use, Difficulty walking, Decreased mobility, Decreased range of motion, Postural dysfunction, Impaired flexibility, Hypermobility, Decreased balance  Visit Diagnosis: Cervicalgia  Muscle weakness (generalized)  Chronic left shoulder pain  Chronic right shoulder pain     Problem List Patient Active Problem List   Diagnosis Date Noted  . Low back pain 07/21/2016  . Right knee pain 06/05/2016  . Left shoulder pain 06/05/2016  . Hydrosalpinx 02/08/2016  . Hemorrhagic ovarian cyst 02/08/2016  . Right hip pain 02/03/2016  . MUSCLE SPASM, BACK 06/08/2009  . HYDROSALPINX 02/07/2008  . HEADACHE 10/28/2007  . TACHYCARDIA 10/14/2007  . ANXIETY DISORDER 05/28/2007  . POLYARTHRITIS 05/09/2007  . UNSPECIFIED DISORDERS OF NERVOUS SYSTEM 04/22/2007  . ABDOMINAL PAIN RIGHT LOWER QUADRANT 04/08/2007  . PALPITATIONS, RECURRENT 11/07/2006  . PROBLEMS W/SMELL/TASTE 06/29/2006  . TOBACCO ABUSE 01/08/2006  . VISUAL IMPAIRMENT 01/08/2006  . DISTURBANCE, VISUAL NOS 01/08/2006  . FIBROCYSTIC BREAST DISEASE 01/08/2006  . [place] DISEASE, CERVICAL 01/08/2006  . VERTIGO 01/08/2006   Jantzen Pilger C. Damione Robideau PT, DPT 09/05/16 3:36 PM   [facility] [address] [place], [place], [address] Phone: [phone]   Fax:  [phone]  Name: Victoria Hall MRN: [id number] Date of  Birth: [birth year]

## 2016-09-06 ENCOUNTER — Ambulatory Visit: Payer: BLUE CROSS/BLUE SHIELD | Admitting: Physical Therapy

## 2016-09-11 ENCOUNTER — Ambulatory Visit: Payer: BLUE CROSS/BLUE SHIELD | Admitting: Physical Therapy

## 2016-09-12 ENCOUNTER — Ambulatory Visit (INDEPENDENT_AMBULATORY_CARE_PROVIDER_SITE_OTHER): Payer: BLUE CROSS/BLUE SHIELD | Admitting: Allergy and Immunology

## 2016-09-12 ENCOUNTER — Encounter: Payer: Self-pay | Admitting: Allergy and Immunology

## 2016-09-12 VITALS — BP 122/78 | HR 72 | Temp 98.2°F | Resp 18 | Ht 65.08 in | Wt 178.4 lb

## 2016-09-12 DIAGNOSIS — J3089 Other allergic rhinitis: Secondary | ICD-10-CM

## 2016-09-12 DIAGNOSIS — R232 Flushing: Secondary | ICD-10-CM | POA: Diagnosis not present

## 2016-09-12 DIAGNOSIS — L500 Allergic urticaria: Secondary | ICD-10-CM

## 2016-09-12 DIAGNOSIS — R768 Other specified abnormal immunological findings in serum: Secondary | ICD-10-CM

## 2016-09-12 DIAGNOSIS — D721 Eosinophilia, unspecified: Secondary | ICD-10-CM

## 2016-09-12 DIAGNOSIS — Z91038 Other insect allergy status: Secondary | ICD-10-CM

## 2016-09-12 DIAGNOSIS — Z91030 Bee allergy status: Secondary | ICD-10-CM

## 2016-09-12 DIAGNOSIS — R197 Diarrhea, unspecified: Secondary | ICD-10-CM

## 2016-09-12 DIAGNOSIS — D72824 Basophilia: Secondary | ICD-10-CM

## 2016-09-12 DIAGNOSIS — R61 Generalized hyperhidrosis: Secondary | ICD-10-CM | POA: Diagnosis not present

## 2016-09-12 DIAGNOSIS — M2550 Pain in unspecified joint: Secondary | ICD-10-CM

## 2016-09-12 MED ORDER — FAMOTIDINE 20 MG PO TABS
20.0000 mg | ORAL_TABLET | Freq: Every day | ORAL | 5 refills | Status: DC
Start: 2016-09-12 — End: 2017-03-01

## 2016-09-12 MED ORDER — MONTELUKAST SODIUM 10 MG PO TABS: 10.0000 mg | ORAL_TABLET | Freq: Every day | ORAL | 5 refills | Status: DC

## 2016-09-12 NOTE — Progress Notes (Signed)
Dear Dr. Lorelei Pont,  Thank you for referring Victoria Hall to the [facility] on 09/12/2016.   Below is a summation of this patient's evaluation and recommendations.  Thank you for your referral. I will keep you informed about this patient's response to treatment.   If you have any questions please do not hesitate to contact me.   Sincerely,  Jiles Prows, MD Allergy / Immunology [facility]   ______________________________________________________________________    NEW PATIENT NOTE  Referring Provider: Darreld Mclean, MD Primary Provider: Darreld Mclean, MD Date of office visit: 09/12/2016    Subjective:   Chief Complaint:  Victoria Hall (DOB: [birth year]) is a 46 y.o. female who presents to the clinic on 09/12/2016 with a chief complaint of Rash and Other (bruising, low body temp., night sweat, joint problems) .     HPI: Victoria Hall presents to this clinic in evaluation of a multitude of different issues.   First, she does have classic allergic rhinoconjunctivitis with nasal congestion and occasional sneezing and occasional itchy eyes for which she intermittently uses an antihistamine when her disease states flares up with triggers of cat exposure and exposure to various strong perfumes and smells. Her symptoms occur on a perennial basis and there does not appear to be a seasonal variation. She also occasionally gets sore throat and throat clearing and "drainage" in her throat about 10 times per year for a few days with each episode.  Second, she develops rashes and flushing. She gets a red raised itchy lesions across her body at multiple locations without any associated systemic or constitutional symptoms on an intermittent basis. She can go months without problems and then when she does develop problems it can sometimes last days or weeks. Her lesions never heal with scar or  hyperpigmentation. She does not know any specific trigger that gives rise to this issue. In addition, she has "flushing" involving her upper chest and neck that appears to occur about every 2 weeks and lasts a few days and is not associated with any systemic or constitutional symptoms. Most of her episodes or without a specific trigger but if she does drink wine or beer she sometimes will develop this issue.  Third, she has issues with diarrhea and constipation and she has been diagnosed with IBS after having a colonoscopy and upper endoscopy. Garlic and onions and egg whites and strawberry consumption sometimes leads to this issue.   Fourth, she has seen a rheumatologist for various joint complaints and apparently has a positive ANA but no specific therapy for an inflammatory disease has been prescribed.  Fifth, she sometimes has night sweats where she needs to get up and change her shirt because she sweaty around her neck. This occurs about 3 times a week and it does disrupt her sleep. When she wakes up in the morning she feels very unrefreshed but she does not take a nap through the day. She does not have any snoring or gasping at night time.  Sixth, she does have a history of intermittent heartburn and regurgitation which is relatively rare just a few times per year.  Seventh, she does have a history of consumption of caffeine usually in the morning and around 3 AM. She eats chocolate about twice a week.  Eighth, she smokes tobacco at a rate of about a quarter pack per day for stress reduction.   Past Medical History:  Diagnosis Date  . Anxiety   . Arthritis   . Cervical intraepithelial neoplasia grade 3 2002   s/p LEEP Rx repeat pap negative  . Cervical radiculopathy   . Fallopian tube disorder    right fallopian tube removed  . Gallstones 09/2015   On CT  . Headache    Migraines  . Hemorrhagic ovarian cyst 02/08/2016  . Hydrosalpinx    [facility]  . Hydrosalpinx  02/08/2016  . Interstitial cystitis   . Neurological abnormality    Neg workup with MRI/MRA in 2007 WNL except decreased caliber MCA proximally, distal cavernous portion of left LCA which may represent true stenosis or technique on exam. Evaluated by Dr Linus Salmons.  . Palpitations   . Partial seizures (HCC)    questionable diag  . Pneumonia    walking pneumonia  . Polycystic ovary    multiple ovarian cysts removed  . S/P cervical discectomy    Dr Vertell Limber, anterior discectomy C5-C7  . S/P epidural steroid injection    Right hip  . Seizures ([place]) 2005   simple partial seizure disorder    Past Surgical History:  Procedure Laterality Date  . APPENDECTOMY  1986  . BUNIONECTOMY Left    bunion removal  . CERVICAL DISC SURGERY  2005   C5-C7  . COLONOSCOPY    . DENTAL SURGERY    . fallopian tue removed    . LAPAROSCOPIC BILATERAL SALPINGECTOMY Left 02/09/2016   Procedure: LAPAROSCOPIC LEFT  SALPINGECTOMY;  Surgeon: Janyth Contes, MD;  Location: [facility];  Service: Gynecology;  Laterality: Left;  . LAPAROSCOPIC LYSIS OF ADHESIONS  02/09/2016   Procedure: LAPAROSCOPIC LYSIS OF ADHESIONS;  Surgeon: Janyth Contes, MD;  Location: [facility];  Service: Gynecology;;  momentum to adenexa  . LAPAROSCOPIC OVARIAN CYSTECTOMY Right 02/09/2016   Procedure: LAPAROSCOPIC OVARIAN CYSTECTOMY;  Surgeon: Janyth Contes, MD;  Location: [facility];  Service: Gynecology;  Laterality: Right;  x2 to right ovary  . LEEP  1998  . OOPHORECTOMY Left 02/09/2016   Procedure: LEFT OOPHORECTOMY;  Surgeon: Janyth Contes, MD;  Location: [facility];  Service: Gynecology;  Laterality: Left;  . TONSILLECTOMY  2001  . UPPER GI ENDOSCOPY      Allergies as of 09/12/2016      Reactions   Clindamycin/lincomycin Itching, Other (See Comments)   Dizziness, trouble swallowing   Codeine Itching, Nausea And Vomiting   Cyclobenzaprine Other (See Comments)   Fast heartbeat, restless leg, nightmares   Gabapentin Other (See  Comments)   Headaches, visual disturbances.    Nyquil Multi-symptom [pseudoeph-doxylamine-dm-apap] Other (See Comments)   Heart racing, dizziness, weakness   Uribel [meth-hyo-m Bl-na Phos-ph Sal] Other (See Comments)   Heart racing, dizziness, blurry vision.   Xanax [alprazolam]    Bruising      Medication List      diazepam 2 MG tablet Commonly known as:  VALIUM Take 2 mg by mouth every 12 (twelve) hours as needed.       Review of systems negative except as noted in HPI / PMHx or noted below:  Review of Systems  Constitutional: Negative.   HENT: Negative.   Eyes: Negative.   Respiratory: Negative.   Cardiovascular: Negative.   Gastrointestinal: Negative.   Genitourinary: Negative.   Musculoskeletal: Negative.   Skin: Negative.   Neurological: Negative.   Endo/Heme/Allergies: Negative.   Psychiatric/Behavioral: Negative.     Family History  Problem Relation Age of Onset  . Adopted: Yes  . COPD Mother   .  Breast cancer Mother   . Other Mother        Teeth problems- all removed by age 75  . Rashes / Skin problems Sister   . Other Sister        Teeth problems- all removed by age 63  . Rashes / Skin problems Brother   . Asthma Brother   . Other Brother        Teeth problems- all removed by age 57    Social History   Social History  . Marital status: Divorced    Spouse name: N/A  . Number of children: 0  . 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 Smoking status: Current Every Day Smoker    Packs/day: 0.00    Years: 0.00    Types: Cigarettes    Last attempt to quit: 11/02/2015  . Smokeless tobacco: Never Used     Comment: currently smoking, began at beginning of June and plans to quit on June 14th. Has smoked off and on in the past.  . Alcohol use No     Comment: occasionally 0-2 per day  . Drug use: No  . Sexual  activity: Yes   Other Topics Concern  . Not on file   Social History Narrative  . No narrative on file    Environmental and Social history  Lives in a house with a dry environment, 2 dogs located inside the household, no carpeting in the bedroom, no plastic on the bed or pillow, and no smokers located inside the household.  Objective:   Vitals:   09/12/16 0852  BP: 122/78  Pulse: 72  Resp: 18  Temp: 98.2 F (36.8 C)   Height: 5' 5.08" (165.3 cm) Weight: 178 lb 6.4 oz (80.9 kg)  Physical Exam  Constitutional: She is well-developed, well-nourished, and in no distress.  HENT:  Head: Normocephalic. Head is without right periorbital erythema and without left periorbital erythema.  Right Ear: Tympanic membrane, external ear and ear canal normal.  Left Ear: Tympanic membrane, external ear and ear canal normal.  Nose: Nose normal. No mucosal edema or rhinorrhea.  Mouth/Throat: Uvula is midline, oropharynx is clear and moist and mucous membranes are normal. No oropharyngeal exudate.  Eyes: Conjunctivae and lids are normal. Pupils are equal, round, and reactive to light.  Neck: Trachea normal. No tracheal tenderness present. No tracheal deviation present. No thyromegaly present.  Cardiovascular: Normal rate, regular rhythm, S1 normal, S2 normal and normal heart sounds.   No murmur heard. Pulmonary/Chest: Effort normal and breath sounds normal. No stridor. No tachypnea. No respiratory distress. She has no wheezes. She has no rales. She exhibits no tenderness.  Abdominal: Soft. She exhibits no distension and no mass. There is no hepatosplenomegaly. There is no tenderness. There is no rebound and no guarding.  Musculoskeletal: She exhibits no edema or tenderness.  Lymphadenopathy:       Head (right side): No tonsillar adenopathy present.       Head (left side): No tonsillar adenopathy present.    She has no cervical adenopathy.    She has no axillary adenopathy.  Neurological: She is  alert. Gait normal.  Skin: No rash noted. She is not diaphoretic. No erythema. No pallor. Nails show no clubbing.  Psychiatric: Mood and affect normal.    Diagnostics: Allergy skin tests were performed.  She did not demonstrate any hypersensitivity against a screening panel of aeroallergens or foods.  Results of blood tests obtained 07/31/2016 identified normal hepatic and renal function, white blood cell count 12.2 with an absolute eosinophil count of 300 and a basophil count of 400 and a lymphocyte count of 2800, hemoglobin 14.3, platelet 325., Normal C3, C4.  Results of blood tests obtained 05/16/2016 identified a positive ANA @ 1:320 diffuse pattern, negative RA screen, negative HLA-B27, normal CCP, normal C-reactive protein.  Results of a sinus CT scan obtained to June 2016 identified the following:  No displaced facial fracture.  No displaced nasal bone fracture. Normal noncontrast appearance of the bilateral orbits and globes.  Normal appearance of the bilateral zygomatic arches and pterygoid plates. Normal appearance of the mandible. The bilateral mandibular condyles are normally located.  There is minimal polypoid mucosal thickening of the left maxillary sinus. There is very minimal mucosal thickening within the right maxillary sinus. The remaining paranasal sinuses and mastoid air cells are normally aerated. No air-fluid levels. No significant nasal septal deviation.  Regional soft tissues appear normal.  No radiopaque foreign body  Results of a chest x-ray obtained 03/14/2016 identified the following:  Lungs are clear. Heart size and pulmonary vascularity are normal. No adenopathy. There is postoperative change in the lower cervical region.  Assessment and Plan:    1. Unexplained night sweats   2. Other allergic rhinitis   3. Allergic urticaria   4. Flushing   5. Hymenoptera allergy   6. Diarrhea, unspecified type   7. Arthralgia, unspecified joint   8. Basophilia    9. Eosinophilia   10. Positive ANA (antinuclear antibody)     1. Allergen avoidance measures  2. Need to use nicotine substitutes to replace tobacco smoke exposure  3. Need to decrease caffeine at least 50% and aim for none slowly  4. Treat and prevent inflammation:   A. loratadine 10 mg tablet 1 time per day  B. famotidine 20 mg tablet 1 time per day  C. montelukast 10 mg tablet 1 time per day  5. Blood - ANA w/reflex to ENA, tryptase, alpha gal, Hymenoptera, celiac, SPEP with immunoelectrophoresis   6. 24-hour urine collection for 5-HIAA and histamines   7. Return to clinic in 4 weeks or earlier if problem  I am not sure that atopic disease explains Hlee's complaints and I'm going to have her obtain additional blood tests looking for possible mast cell activating syndrome as well as a hunt for the cause of her basophilia and eosinophilia and night sweats and arthralgias and urticaria and flushing and also investigation of her apparent Hymenoptera allergy. I will contact her with the results of her blood and urine tests once they are available for review. She obviously needs to stop smoking and I had a talk with her today about the need to find a different hobby and use nicotine substitutes to replace tobacco smoke exposure and she should also decrease her caffeine consumption as there does appear to be a component of reflux disease contributing to some of her symptoms. I have given her a collection of an H1 and H2 receptor blocker and a leukotriene modifier to see if we can alter some of her symptomatology. I will see her back in this clinic in 4 weeks or earlier if there is a problem.  Jiles Prows, MD [facility]

## 2016-09-12 NOTE — Patient Instructions (Addendum)
  1. Allergen avoidance measures  2. Need to use nicotine substitutes to replace tobacco smoke exposure  3. Need to decrease caffeine at least 50% and aim for none slowly  4. Treat and prevent inflammation:   A. loratadine 10 mg tablet 1 time per day  B. famotidine 20 mg tablet 1 time per day  C. montelukast 10 mg tablet 1 time per day  5. Blood - ANA w/reflex to ENA, tryptase, alpha gal, Hymenoptera, celiac, SPEP with immunoelectrophoresis   6. 24-hour urine collection for 5-HIAA and histamines   7. Return to clinic in 4 weeks or earlier if problem

## 2016-09-13 ENCOUNTER — Ambulatory Visit: Payer: BLUE CROSS/BLUE SHIELD | Admitting: Physical Therapy

## 2016-09-14 DIAGNOSIS — R197 Diarrhea, unspecified: Secondary | ICD-10-CM | POA: Diagnosis not present

## 2016-09-14 DIAGNOSIS — L500 Allergic urticaria: Secondary | ICD-10-CM | POA: Diagnosis not present

## 2016-09-14 DIAGNOSIS — R232 Flushing: Secondary | ICD-10-CM | POA: Diagnosis not present

## 2016-09-14 DIAGNOSIS — R61 Generalized hyperhidrosis: Secondary | ICD-10-CM | POA: Diagnosis not present

## 2016-09-18 ENCOUNTER — Ambulatory Visit: Payer: BLUE CROSS/BLUE SHIELD | Admitting: Physical Therapy

## 2016-09-20 ENCOUNTER — Ambulatory Visit: Payer: BLUE CROSS/BLUE SHIELD | Admitting: Physical Therapy

## 2016-09-21 LAB — HYMENOPTERA PROFILE 2
Bumblebee: <0.1 kU/L
Honeybee IgE: <0.1 kU/L
Hornet, White Face, IgE: <0.1 kU/L
Hornet, Yellow, IgE: <0.1 kU/L
Paper Wasp IgE: <0.1 kU/L
Yellow Jacket, IgE: <0.1 kU/L

## 2016-09-21 LAB — CELIAC PANEL 10
Antigliadin Abs, IgA: 4 units (ref 0–19)
Endomysial IgA: NEGATIVE
Gliadin IgG: 1 units (ref 0–19)
IgA/Immunoglobulin A, Serum: 200 mg/dL (ref 87–352)
Tissue Transglut Ab: 7 U/mL — ABNORMAL HIGH (ref 0–5)
Transglutaminase IgA: <2 U/mL (ref 0–3)

## 2016-09-21 LAB — PROTEIN ELECTROPHORESIS, SERUM
A/G Ratio: 1.2 (ref 0.7–1.7)
Albumin ELP: 3.8 g/dL (ref 2.9–4.4)
Alpha 1: 0.3 g/dL (ref 0.0–0.4)
Alpha 2: 0.7 g/dL (ref 0.4–1.0)
Beta: 1.1 g/dL (ref 0.7–1.3)
Gamma Globulin: 1.2 g/dL (ref 0.4–1.8)
Globulin, Total: 3.3 g/dL (ref 2.2–3.9)
Total Protein: 7.1 g/dL (ref 6.0–8.5)

## 2016-09-21 LAB — ALPHA-GAL PANEL
Alpha Gal IgE*: <0.1 kU/L (ref <0.35)
Beef (Bos spp) IgE: <0.1 kU/L (ref <0.35)
Class Interpretation: 0
Class Interpretation: 0
Class Interpretation: 0
Lamb/Mutton (Ovis spp) IgE: <0.1 kU/L (ref <0.35)
Pork (Sus spp) IgE: <0.1 kU/L (ref <0.35)

## 2016-09-21 LAB — HISTAMINE DETERMINATION, URINE
Histamine,ug/24hr,U: 18 ug/24 hr (ref 0–65)
Histamine,ug/L,U: 10 ug/L

## 2016-09-21 LAB — ANA W/REFLEX IF POSITIVE: Anti Nuclear Antibody(ANA): NEGATIVE

## 2016-09-21 LAB — 5 HIAA, QUANTITATIVE, URINE, 24 HOUR
5-HIAA, Ur: 1.9 mg/L
5-HIAA,Quant.,24 Hr Urine: 3.4 mg/24 hr (ref 0.0–14.9)

## 2016-09-21 LAB — TRYPTASE: Tryptase: 4 ug/L (ref 2.2–13.2)

## 2016-09-25 ENCOUNTER — Ambulatory Visit: Payer: BLUE CROSS/BLUE SHIELD | Admitting: Physical Therapy

## 2016-09-25 ENCOUNTER — Encounter: Payer: Self-pay | Admitting: Physical Therapy

## 2016-09-25 ENCOUNTER — Encounter: Payer: Self-pay | Admitting: Family Medicine

## 2016-09-25 DIAGNOSIS — G8929 Other chronic pain: Secondary | ICD-10-CM | POA: Diagnosis not present

## 2016-09-25 DIAGNOSIS — M25512 Pain in left shoulder: Secondary | ICD-10-CM | POA: Diagnosis not present

## 2016-09-25 DIAGNOSIS — M25511 Pain in right shoulder: Secondary | ICD-10-CM | POA: Diagnosis not present

## 2016-09-25 DIAGNOSIS — M542 Cervicalgia: Secondary | ICD-10-CM | POA: Diagnosis not present

## 2016-09-25 DIAGNOSIS — M6281 Muscle weakness (generalized): Secondary | ICD-10-CM

## 2016-09-25 NOTE — Patient Instructions (Signed)
Strengthening: Resisted Internal Rotation   Hold tubing in left hand, elbow at side and forearm out. Rotate forearm in across body. Repeat _10___ times per set. Do _1-2___ sets per session. Do _1-2___ sessions per day.  http://orth.exer.us/830   Copyright  VHI. All rights reserved.  Strengthening: Resisted External Rotation   Hold tubing in right hand, elbow at side and forearm across body. Rotate forearm out. Repeat __10__ times per set. Do __1-2__ sets per session. Do _1-2___ sessions per day.  http://orth.exer.us/828   Copyright  VHI. All rights reserved.  Strengthening: Resisted Flexion   Hold tubing with left arm at side. Pull forward and up. Move shoulder through pain-free range of motion. Repeat __10__ times per set. Do _1-2___ sets per session. Do __1-2__ sessions per day.  http://orth.exer.us/824   Copyright  VHI. All rights reserved.  Strengthening: Resisted Extension   Hold tubing in right hand, arm forward. Pull arm back, elbow straight. Repeat _10___ times per set. Do __1-2__ sets per session. Do __1-2__ sessions per day.  http://orth.exer.us/832   Copyright  VHI. All rights reserved.    Dr. Oneida Alar at [facility] for EDS?  Go gradually and use a yellow band

## 2016-09-25 NOTE — Therapy (Signed)
[place], [place], [address] Phone: [phone]   Fax:  [phone]  Physical Therapy Treatment  Patient Details  Name: Victoria Hall MRN: [id number] Date of Birth: 1971/01/30 Referring Provider: Dr. Silvestre Mesi   Encounter Date: 09/25/2016      PT End of Session - 09/25/16 1737    Visit Number 3   Number of Visits 12   Date for PT Re-Evaluation 10/11/16   PT Start Time 2836   PT Stop Time 1415   PT Time Calculation (min) 40 min   Activity Tolerance Patient tolerated treatment well   Behavior During Therapy [facility] for tasks assessed/performed      Past Medical History:  Diagnosis Date  . Anxiety   . Arthritis   . Cervical intraepithelial neoplasia grade 3 2002   s/p LEEP Rx repeat pap negative  . Cervical radiculopathy   . Fallopian tube disorder    right fallopian tube removed  . Gallstones 09/2015   On CT  . Headache    Migraines  . Hemorrhagic ovarian cyst 02/08/2016  . Hydrosalpinx    [facility]  . Hydrosalpinx 02/08/2016  . Interstitial cystitis   . Neurological abnormality    Neg workup with MRI/MRA in 2007 WNL except decreased caliber MCA proximally, distal cavernous portion of left LCA which may represent true stenosis or technique on exam. Evaluated by Dr Linus Salmons.  . Palpitations   . Partial seizures (HCC)    questionable diag  . Pneumonia    walking pneumonia  . Polycystic ovary    multiple ovarian cysts removed  . S/P cervical discectomy    Dr Vertell Limber, anterior discectomy C5-C7  . S/P epidural steroid injection    Right hip  . Seizures ([place]) 2005   simple partial seizure disorder    Past Surgical History:  Procedure Laterality Date  . APPENDECTOMY  1986  . BUNIONECTOMY Left    bunion removal  . CERVICAL DISC SURGERY  2005   C5-C7  . COLONOSCOPY    . DENTAL SURGERY    . fallopian tue removed    . LAPAROSCOPIC BILATERAL SALPINGECTOMY Left  02/09/2016   Procedure: LAPAROSCOPIC LEFT  SALPINGECTOMY;  Surgeon: Janyth Contes, MD;  Location: [facility];  Service: Gynecology;  Laterality: Left;  . LAPAROSCOPIC LYSIS OF ADHESIONS  02/09/2016   Procedure: LAPAROSCOPIC LYSIS OF ADHESIONS;  Surgeon: Janyth Contes, MD;  Location: [facility];  Service: Gynecology;;  momentum to adenexa  . LAPAROSCOPIC OVARIAN CYSTECTOMY Right 02/09/2016   Procedure: LAPAROSCOPIC OVARIAN CYSTECTOMY;  Surgeon: Janyth Contes, MD;  Location: [facility];  Service: Gynecology;  Laterality: Right;  x2 to right ovary  . LEEP  1998  . OOPHORECTOMY Left 02/09/2016   Procedure: LEFT OOPHORECTOMY;  Surgeon: Janyth Contes, MD;  Location: [facility];  Service: Gynecology;  Laterality: Left;  . TONSILLECTOMY  2001  . UPPER GI ENDOSCOPY      There were no vitals filed for this visit.      Subjective Assessment - 09/25/16 1339    Subjective After doing the exercises I have severe pain and can barely lift my arm. Has not seen Dr. Barbaraann Barthel.  Has not done any exercises. Seeing an immunologist who thinks she may have Mast Cell activation Syndrome   Currently in Pain? Yes   Pain Location Shoulder   Pain Orientation Left   Pain Descriptors / Indicators Aching   Pain Type Chronic pain   Pain Radiating Towards L lower  arm    Pain Onset More than a month ago   Pain Frequency Intermittent   Aggravating Factors  exercises    Pain Relieving Factors ice, heat, rest    Pain Score 9   Pain Location Hip  severe with moving.    Pain Orientation Right   Pain Descriptors / Indicators Constant   Pain Type Chronic pain   Pain Onset More than a month ago   Pain Frequency Constant   Aggravating Factors  WB    Pain Relieving Factors rest            [facility] PT Assessment - 09/25/16 0001      AROM   Left Shoulder Flexion 165 Degrees   Left Shoulder ABduction 160 Degrees   Left Shoulder Internal Rotation --  mid T    Left Shoulder External Rotation --  low T       Strength   Left Shoulder Flexion 4+/5   Left Shoulder ABduction --  NT   Left Shoulder Internal Rotation 5/5   Left Shoulder External Rotation 5/5                     [facility] Adult PT Treatment/Exercise - 09/25/16 0001      Shoulder Exercises: Supine   Other Supine Exercises magic circle ex: overhead press x 10 , ER isometric flexion and overhead pressing out isometric      Shoulder Exercises: Sidelying   Flexion Strengthening;Left;10 reps   Flexion Weight (lbs) 1   ABduction Strengthening;Left;10 reps   ABduction Weight (lbs) 1   Other Sidelying Exercises horiz abd x 10    Other Sidelying Exercises 1     Shoulder Exercises: Standing   External Rotation Strengthening;Left;10 reps   Theraband Level (Shoulder External Rotation) Level 1 (Yellow)   Internal Rotation Strengthening;Left;10 reps   Theraband Level (Shoulder Internal Rotation) Level 2 (Red)   Extension Strengthening;Both;10 reps   Theraband Level (Shoulder Extension) Level 2 (Red)   Row Strengthening;Both;20 reps   Theraband Level (Shoulder Row) Level 3 (Green)   Other Standing Exercises addcution  red x 10 , manual cues to avoid L scap protract      Manual Therapy   Manual Therapy Joint mobilization;Passive ROM   Manual therapy comments ER/IR rhythmic stab    Joint Mobilization scapular mobs    Passive ROM all planes                PT Education - 09/25/16 1417    Education provided Yes   Education Details HEP modifications and new , Dr. Oneida Alar, EDS?    Person(s) Educated Patient   Methods Explanation;Demonstration   Comprehension Verbalized understanding;Returned demonstration          PT Short Term Goals - 09/25/16 1642      PT SHORT TERM GOAL #1   Title The patient will be indep with HEP for shoulder ROM, posture, and strength    Status On-going     PT SHORT TERM GOAL #2   Title Patient will be able to walk short distances without increasing shoulder pain due to strategies to  stabilize L UE   Status On-going           PT Long Term Goals - 09/25/16 1643      PT LONG TERM GOAL #1   Title patient to be independent with HEP    Status On-going     PT LONG TERM GOAL #2   Title Patient will  be able to reach to 120 deg and maintain the position for 15-20 sec with pain in LUE no more than 2/10 for home tasks, painting.     Status On-going     PT LONG TERM GOAL #3   Title Pt will score FOTO less than 40% limited to show improvement in overall function.    Status On-going     PT LONG TERM GOAL #4   Title Pt wil be able to use LT arm for ADLs with min increase in pain, including reaching to lower back and behind head.    Baseline able to dmeo today with min pain       Status Partially Met               Plan - 09/25/16 1738    Clinical Impression Statement Pt with pain post exercise somewhat out of proportion to the exercises.  This may be due to the seemingly widespread inflammation in her body. She has normal AROM and strength in LUE, pos signs for impingement.  Also discussed co-morbidities which may include POTS, EDS.  Modified HEP to include less ROM and lighter resistance.  No progress at this point.  She has cancelled 4 appts due to other illness.    PT Next Visit Plan ionto? periscapular stabilization, increase HEP as tol   PT Home Exercise Plan [place], yellow, scaplar stab focus    Consulted and Agree with Plan of Care Patient      Patient will benefit from skilled therapeutic intervention in order to improve the following deficits and impairments:  Abnormal gait, Impaired sensation, Decreased activity tolerance, Decreased strength, Pain, Impaired UE functional use, Difficulty walking, Decreased mobility, Decreased range of motion, Postural dysfunction, Impaired flexibility, Hypermobility, Decreased balance  Visit Diagnosis: Chronic left shoulder pain  Chronic right shoulder pain  Muscle weakness (generalized)     Problem  List Patient Active Problem List   Diagnosis Date Noted  . Low back pain 07/21/2016  . Right knee pain 06/05/2016  . Left shoulder pain 06/05/2016  . Hydrosalpinx 02/08/2016  . Hemorrhagic ovarian cyst 02/08/2016  . Right hip pain 02/03/2016  . MUSCLE SPASM, BACK 06/08/2009  . HYDROSALPINX 02/07/2008  . HEADACHE 10/28/2007  . TACHYCARDIA 10/14/2007  . ANXIETY DISORDER 05/28/2007  . POLYARTHRITIS 05/09/2007  . UNSPECIFIED DISORDERS OF NERVOUS SYSTEM 04/22/2007  . ABDOMINAL PAIN RIGHT LOWER QUADRANT 04/08/2007  . PALPITATIONS, RECURRENT 11/07/2006  . PROBLEMS W/SMELL/TASTE 06/29/2006  . TOBACCO ABUSE 01/08/2006  . VISUAL IMPAIRMENT 01/08/2006  . DISTURBANCE, VISUAL NOS 01/08/2006  . FIBROCYSTIC BREAST DISEASE 01/08/2006  . [place] DISEASE, CERVICAL 01/08/2006  . VERTIGO 01/08/2006    PAA,JENNIFER 09/25/2016, 5:49 PM  [facility] [address] [place], [place], [address] Phone: [phone]   Fax:  [phone]  Name: Victoria Hall MRN: [id number] Date of Birth: Nov 27, 1970  Raeford Razor, PT 09/25/16 5:50 PM Phone: [phone] Fax: [phone]

## 2016-09-27 ENCOUNTER — Ambulatory Visit: Payer: BLUE CROSS/BLUE SHIELD | Admitting: Physical Therapy

## 2016-09-27 DIAGNOSIS — G8929 Other chronic pain: Secondary | ICD-10-CM

## 2016-09-27 DIAGNOSIS — M6281 Muscle weakness (generalized): Secondary | ICD-10-CM | POA: Diagnosis not present

## 2016-09-27 DIAGNOSIS — M25512 Pain in left shoulder: Secondary | ICD-10-CM | POA: Diagnosis not present

## 2016-09-27 DIAGNOSIS — M25511 Pain in right shoulder: Secondary | ICD-10-CM | POA: Diagnosis not present

## 2016-09-27 DIAGNOSIS — M542 Cervicalgia: Secondary | ICD-10-CM | POA: Diagnosis not present

## 2016-09-27 NOTE — Therapy (Addendum)
[place], [place], [address] Phone: [phone]   Fax:  [phone]  Physical Therapy Treatment and Discharge   Patient Details  Name: Victoria Hall MRN: [id number] Date of Birth: Dec 03, 1970 Referring Provider: Dr. Silvestre Mesi   Encounter Date: 09/27/2016      PT End of Session - 09/27/16 1422    Visit Number 4   Number of Visits 12   Date for PT Re-Evaluation 10/11/16   PT Start Time 1346   PT Stop Time 1445   PT Time Calculation (min) 59 min   Activity Tolerance Patient tolerated treatment well   Behavior During Therapy [facility] for tasks assessed/performed      Past Medical History:  Diagnosis Date  . Anxiety   . Arthritis   . Cervical intraepithelial neoplasia grade 3 2002   s/p LEEP Rx repeat pap negative  . Cervical radiculopathy   . Fallopian tube disorder    right fallopian tube removed  . Gallstones 09/2015   On CT  . Headache    Migraines  . Hemorrhagic ovarian cyst 02/08/2016  . Hydrosalpinx    [facility]  . Hydrosalpinx 02/08/2016  . Interstitial cystitis   . Neurological abnormality    Neg workup with MRI/MRA in 2007 WNL except decreased caliber MCA proximally, distal cavernous portion of left LCA which may represent true stenosis or technique on exam. Evaluated by Dr Linus Salmons.  . Palpitations   . Partial seizures (HCC)    questionable diag  . Pneumonia    walking pneumonia  . Polycystic ovary    multiple ovarian cysts removed  . S/P cervical discectomy    Dr Vertell Limber, anterior discectomy C5-C7  . S/P epidural steroid injection    Right hip  . Seizures ([place]) 2005   simple partial seizure disorder    Past Surgical History:  Procedure Laterality Date  . APPENDECTOMY  1986  . BUNIONECTOMY Left    bunion removal  . CERVICAL DISC SURGERY  2005   C5-C7  . COLONOSCOPY    . DENTAL SURGERY    . fallopian tue removed    . LAPAROSCOPIC BILATERAL  SALPINGECTOMY Left 02/09/2016   Procedure: LAPAROSCOPIC LEFT  SALPINGECTOMY;  Surgeon: Janyth Contes, MD;  Location: [facility];  Service: Gynecology;  Laterality: Left;  . LAPAROSCOPIC LYSIS OF ADHESIONS  02/09/2016   Procedure: LAPAROSCOPIC LYSIS OF ADHESIONS;  Surgeon: Janyth Contes, MD;  Location: [facility];  Service: Gynecology;;  momentum to adenexa  . LAPAROSCOPIC OVARIAN CYSTECTOMY Right 02/09/2016   Procedure: LAPAROSCOPIC OVARIAN CYSTECTOMY;  Surgeon: Janyth Contes, MD;  Location: [place] [facility];  Service: Gynecology;  Laterality: Right;  x2 to right ovary  . LEEP  1998  . OOPHORECTOMY Left 02/09/2016   Procedure: LEFT OOPHORECTOMY;  Surgeon: Janyth Contes, MD;  Location: [place] [facility];  Service: Gynecology;  Laterality: Left;  . TONSILLECTOMY  2001  . UPPER GI ENDOSCOPY      There were no vitals filed for this visit.      Subjective Assessment - 09/27/16 1351    Subjective The modifications of the exercises have helped.  Dr. Oneida Alar not avail.  Dr.  Adriana Mccallum recommended.    Currently in Pain? No/denies   Pain Score 0-No pain   Pain Location Shoulder   Pain Score 7  with movement    Pain Location Neck   Pain Orientation Left;Lateral   Pain Descriptors / Indicators Tightness   Pain Type Chronic pain  Pain Onset More than a month ago   Pain Frequency Intermittent   Aggravating Factors  turning head    Pain Relieving Factors rest                          [facility] Adult PT Treatment/Exercise - 09/27/16 0001      Neck Exercises: Machines for Strengthening   UBE (Upper Arm Bike) 5 min level 1 (did 2.5 min each direction)      Neck Exercises: Supine   Neck Retraction 10 reps;5 secs   Capital Flexion 10 reps;5 secs   Cervical Rotation Both;10 reps   Cervical Rotation Limitations small ROM into ball      Shoulder Exercises: Supine   External Rotation Strengthening;Both;10 reps   Theraband Level (Shoulder External Rotation) Level 1 (Yellow)   Other  Supine Exercises scapular stab yellow band x 10: overhead lift, diagonal and horiz abd      Electrical Stimulation   Electrical Stimulation Location upper back, L shoulder    Electrical Stimulation Action IFC    Electrical Stimulation Parameters 6    Electrical Stimulation Goals Pain     Manual Therapy   Manual Therapy Soft tissue mobilization   Soft tissue mobilization posterior cervicals and upper trap      Neck Exercises: Stretches   Upper Trapezius Stretch 2 reps;30 seconds   Levator Stretch 2 reps;30 seconds   Other Neck Stretches extension with hands behind neck x 3 , 10 sec                 PT Education - 09/27/16 1422    Education provided Yes   Education Details stabilization ex    Person(s) Educated Patient   Methods Explanation   Comprehension Verbalized understanding          PT Short Term Goals - 09/25/16 1642      PT SHORT TERM GOAL #1   Title The patient will be indep with HEP for shoulder ROM, posture, and strength    Status On-going     PT SHORT TERM GOAL #2   Title Patient will be able to walk short distances without increasing shoulder pain due to strategies to stabilize L UE   Status On-going           PT Long Term Goals - 09/25/16 1643      PT LONG TERM GOAL #1   Title patient to be independent with HEP    Status On-going     PT LONG TERM GOAL #2   Title Patient will be able to reach to 120 deg and maintain the position for 15-20 sec with pain in LUE no more than 2/10 for home tasks, painting.     Status On-going     PT LONG TERM GOAL #3   Title Pt will score FOTO less than 40% limited to show improvement in overall function.    Status On-going     PT LONG TERM GOAL #4   Title Pt wil be able to use LT arm for ADLs with min increase in pain, including reaching to lower back and behind head.    Baseline able to dmeo today with min pain       Status Partially Met               Plan - 09/27/16 1431    Clinical  Impression Statement Pt with improved mobility today due to less hip pain.  Worked  in supine on cervical and scapular stabilization.  Developed a stabbing pain in middle back with UBE and Rt. sided neck pain in supine. USed IFC and MHP to reduce tightness in neck and shoulders.    PT Next Visit Plan ionto! periscapular stabilization, increase HEP as tol, manual therapy to neck and scap    PT Home Exercise Plan [place], yellow, scaplar stab focus    Consulted and Agree with Plan of Care Patient      Patient will benefit from skilled therapeutic intervention in order to improve the following deficits and impairments:  Abnormal gait, Impaired sensation, Decreased activity tolerance, Decreased strength, Pain, Impaired UE functional use, Difficulty walking, Decreased mobility, Decreased range of motion, Postural dysfunction, Impaired flexibility, Hypermobility, Decreased balance  Visit Diagnosis: Chronic left shoulder pain  Chronic right shoulder pain  Muscle weakness (generalized)  Cervicalgia     Problem List Patient Active Problem List   Diagnosis Date Noted  . Low back pain 07/21/2016  . Right knee pain 06/05/2016  . Left shoulder pain 06/05/2016  . Hydrosalpinx 02/08/2016  . Hemorrhagic ovarian cyst 02/08/2016  . Right hip pain 02/03/2016  . MUSCLE SPASM, BACK 06/08/2009  . HYDROSALPINX 02/07/2008  . HEADACHE 10/28/2007  . TACHYCARDIA 10/14/2007  . ANXIETY DISORDER 05/28/2007  . POLYARTHRITIS 05/09/2007  . UNSPECIFIED DISORDERS OF NERVOUS SYSTEM 04/22/2007  . ABDOMINAL PAIN RIGHT LOWER QUADRANT 04/08/2007  . PALPITATIONS, RECURRENT 11/07/2006  . PROBLEMS W/SMELL/TASTE 06/29/2006  . TOBACCO ABUSE 01/08/2006  . VISUAL IMPAIRMENT 01/08/2006  . DISTURBANCE, VISUAL NOS 01/08/2006  . FIBROCYSTIC BREAST DISEASE 01/08/2006  . [place] DISEASE, CERVICAL 01/08/2006  . VERTIGO 01/08/2006    Victoria Hall 09/27/2016, 2:36 PM  [facility] [address] [place], [place], [address] Phone: [phone]   Fax:  [phone]  Name: Victoria Hall MRN: [id number] Date of Birth: 07-27-1970  Raeford Razor, PT 09/27/16 2:37 PM Phone: [phone] Fax: [phone]  PHYSICAL THERAPY DISCHARGE SUMMARY  Visits from Start of Care: 4  Current functional level related to goals / functional outcomes: See above, was limited a great deal by muscle spasms and hip pain.    Remaining deficits: See above, has not returned in several weeks.    Education / Equipment: PT, HEP, posture, modalities, gait with cane  Plan: Patient agrees to discharge.  Patient goals were not met. Patient is being discharged due to not returning since the last visit.  ?????    Raeford Razor, PT 11/09/16 3:31 PM Phone: [phone] Fax: [phone]

## 2016-10-02 ENCOUNTER — Encounter: Payer: Self-pay | Admitting: Family Medicine

## 2016-10-02 ENCOUNTER — Ambulatory Visit (INDEPENDENT_AMBULATORY_CARE_PROVIDER_SITE_OTHER): Payer: BLUE CROSS/BLUE SHIELD | Admitting: Family Medicine

## 2016-10-02 DIAGNOSIS — G8929 Other chronic pain: Secondary | ICD-10-CM

## 2016-10-02 DIAGNOSIS — M25512 Pain in left shoulder: Secondary | ICD-10-CM

## 2016-10-05 NOTE — Assessment & Plan Note (Signed)
evaluation today for Victoria Hall was reassuring - by history and exam no evidence of classic or hypermobile type.  From an orthopedic standpoint I cannot fully explain her pain.  She has seen rheumatology with positive ANA (though repeat negative) and HLA-B27 in past but no determined cause for her symptoms.  Seeing allergist has helped some of her symptoms with new medications.  Discussed would continue follow-up with rheum.  I would not recommend continued PT if she feels this is worsening despite basic strengthening which should help in general.  Somatization disorder another possibility we touched on briefly.  I recommended she follow up with [place] as needed.

## 2016-10-05 NOTE — Progress Notes (Signed)
PCP: Copland, Victoria Filler, MD  Subjective:   HPI: Patient is a 46 y.o. female here for evaluation of possible Ehlers-Danlos.  Patient has been going to physical therapy for left shoulder, upper back. She reports pain seemed worse with this and had a headache for 2 days after most recent treatment. They were concerned she might have connective tissue disorder - she tried to get in with Dr. Oneida Alar but not accepting new patients. She is here for evaluation for this. She reports she has recently seen an allergist and combination of claritin, pepcid, singulair has helped some of her symptoms - less diarrhea and rashes as a result. States bloodwork and allergy testing there was reassuring - a repeat ANA was normal also. With respect to possible EDS - she has no family history of this but others in her family have been hypermobile. No personal history of pelvic prolapse, mitral valve prolapse or aortic root dilatation. Feels she bruises easily. Pain level currently 2/10 - she has had multiple joint complaints - we have seen her for hip, shoulder, back problems.  Past Medical History:  Diagnosis Date  . Anxiety   . Arthritis   . Cervical intraepithelial neoplasia grade 3 2002   s/p LEEP Rx repeat pap negative  . Cervical radiculopathy   . Fallopian tube disorder    right fallopian tube removed  . Gallstones 09/2015   On CT  . Headache    Migraines  . Hemorrhagic ovarian cyst 02/08/2016  . Hydrosalpinx    [facility]  . Hydrosalpinx 02/08/2016  . Interstitial cystitis   . Neurological abnormality    Neg workup with MRI/MRA in 2007 WNL except decreased caliber MCA proximally, distal cavernous portion of left LCA which may represent true stenosis or technique on exam. Evaluated by Dr Linus Salmons.  . Palpitations   . Partial seizures (HCC)    questionable diag  . Pneumonia    walking pneumonia  . Polycystic ovary    multiple ovarian cysts removed  . S/P cervical discectomy     Dr Vertell Limber, anterior discectomy C5-C7  . S/P epidural steroid injection    Right hip  . Seizures ([place]) 2005   simple partial seizure disorder    Current Outpatient Prescriptions on File Prior to Visit  Medication Sig Dispense Refill  . diazepam (VALIUM) 2 MG tablet Take 2 mg by mouth every 12 (twelve) hours as needed.     . famotidine (PEPCID) 20 MG tablet Take 1 tablet (20 mg total) by mouth daily. 30 tablet 5  . loratadine (CLARITIN) 10 MG tablet Take 10 mg by mouth daily.    . montelukast (SINGULAIR) 10 MG tablet Take 1 tablet (10 mg total) by mouth at bedtime. 30 tablet 5   No current facility-administered medications on file prior to visit.     Past Surgical History:  Procedure Laterality Date  . APPENDECTOMY  1986  . BUNIONECTOMY Left    bunion removal  . CERVICAL DISC SURGERY  2005   C5-C7  . COLONOSCOPY    . DENTAL SURGERY    . fallopian tue removed    . LAPAROSCOPIC BILATERAL SALPINGECTOMY Left 02/09/2016   Procedure: LAPAROSCOPIC LEFT  SALPINGECTOMY;  Surgeon: Janyth Contes, MD;  Location: [facility];  Service: Gynecology;  Laterality: Left;  . LAPAROSCOPIC LYSIS OF ADHESIONS  02/09/2016   Procedure: LAPAROSCOPIC LYSIS OF ADHESIONS;  Surgeon: Janyth Contes, MD;  Location: [facility];  Service: Gynecology;;  momentum to adenexa  . LAPAROSCOPIC OVARIAN CYSTECTOMY  Right 02/09/2016   Procedure: LAPAROSCOPIC OVARIAN CYSTECTOMY;  Surgeon: Janyth Contes, MD;  Location: [facility];  Service: Gynecology;  Laterality: Right;  x2 to right ovary  . LEEP  1998  . OOPHORECTOMY Left 02/09/2016   Procedure: LEFT OOPHORECTOMY;  Surgeon: Janyth Contes, MD;  Location: [facility];  Service: Gynecology;  Laterality: Left;  . TONSILLECTOMY  2001  . UPPER GI ENDOSCOPY      Allergies  Allergen Reactions  . Clindamycin/Lincomycin Itching and Other (See Comments)    Dizziness, trouble swallowing  . Codeine Itching and Nausea And Vomiting  . Cyclobenzaprine Other (See Comments)     Fast heartbeat, restless leg, nightmares  . Gabapentin Other (See Comments)    Headaches, visual disturbances.   . Nyquil Multi-Symptom [Pseudoeph-Doxylamine-Dm-Apap] Other (See Comments)    Heart racing, dizziness, weakness  . Uribel [Meth-Hyo-M Bl-Na Phos-Ph Sal] Other (See Comments)    Heart racing, dizziness, blurry vision.  . Xanax [Alprazolam]     Bruising    Social History   Social History  . Marital status: Divorced    Spouse name: N/A  . Number of children: 0  . 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 Smoking status: Current Every Day Smoker    Packs/day: 0.00    Years: 0.00    Types: Cigarettes    Last attempt to quit: 11/02/2015  . Smokeless tobacco: Never Used     Comment: currently smoking, began at beginning of June and plans to quit on June 14th. Has smoked off and on in the past.  . Alcohol use No     Comment: occasionally 0-2 per day  . Drug use: No  . Sexual activity: Yes   Other Topics Concern  . Not on file   Social History Narrative  . No narrative on file    Family History  Problem Relation Age of Onset  . Adopted: Yes  . COPD Mother   . Breast cancer Mother   . Other Mother        Teeth problems- all removed by age 69  . Rashes / Skin problems Sister   . Other Sister        Teeth problems- all removed by age 92  . Rashes / Skin problems Brother   . Asthma Brother   . Other Brother        Teeth problems- all removed by age 71    BP 120/81   Pulse 83   Ht '5\' 5"'$  (1.651 m)   Wt 180 lb (81.6 kg)   BMI 29.95 kg/m   Review of Systems: See HPI above.     Objective:  Physical Exam:  Gen: NAD, comfortable in exam room  HEENT:  Normal palate and no dental crowding. Skin: Normal tenting without hyperextensibility.  No striae.  No papules of heels.  No abnormal scarring of prior incisions or injuries  noted. Abd:  No palpable hernias (incisional, direct inguinal) with rest or valsalva. MSK:  Negative thumb and hand signs.  Arm span: height 1:1 (both 65") Beighton score 2/9 (5th digit extension bilaterally).   Assessment & Plan:  1. Shoulder, hip pain - evaluation today for Drue Dun was reassuring - by history and exam no evidence of classic or hypermobile type.  From an orthopedic standpoint I cannot fully explain her pain.  She has seen rheumatology with positive ANA (though repeat negative) and HLA-B27 in past but no determined cause for her symptoms.  Seeing allergist has helped some of her symptoms with new medications.  Discussed would continue follow-up with rheum.  I would not recommend continued PT if she feels this is worsening despite basic strengthening which should help in general.  Somatization disorder another possibility we touched on briefly.  I recommended she follow up with [place] as needed.

## 2016-10-10 ENCOUNTER — Other Ambulatory Visit: Payer: Self-pay | Admitting: Allergy and Immunology

## 2016-10-10 ENCOUNTER — Ambulatory Visit (INDEPENDENT_AMBULATORY_CARE_PROVIDER_SITE_OTHER): Payer: BLUE CROSS/BLUE SHIELD | Admitting: Allergy and Immunology

## 2016-10-10 ENCOUNTER — Encounter: Payer: Self-pay | Admitting: Allergy and Immunology

## 2016-10-10 VITALS — BP 118/68 | HR 76 | Resp 12

## 2016-10-10 DIAGNOSIS — Z91030 Bee allergy status: Secondary | ICD-10-CM

## 2016-10-10 DIAGNOSIS — L500 Allergic urticaria: Secondary | ICD-10-CM | POA: Diagnosis not present

## 2016-10-10 DIAGNOSIS — R232 Flushing: Secondary | ICD-10-CM

## 2016-10-10 DIAGNOSIS — D72824 Basophilia: Secondary | ICD-10-CM

## 2016-10-10 DIAGNOSIS — D721 Eosinophilia, unspecified: Secondary | ICD-10-CM

## 2016-10-10 DIAGNOSIS — R197 Diarrhea, unspecified: Secondary | ICD-10-CM | POA: Diagnosis not present

## 2016-10-10 DIAGNOSIS — J3089 Other allergic rhinitis: Secondary | ICD-10-CM | POA: Diagnosis not present

## 2016-10-10 DIAGNOSIS — R61 Generalized hyperhidrosis: Secondary | ICD-10-CM | POA: Diagnosis not present

## 2016-10-10 DIAGNOSIS — Z91038 Other insect allergy status: Secondary | ICD-10-CM

## 2016-10-10 NOTE — Progress Notes (Signed)
Follow-up Note  Referring Provider: Darreld Mclean, MD Primary Provider: Darreld Mclean, MD Date of Office Visit: 10/10/2016  Subjective:   Victoria Hall (DOB: Sep 17, 1970) is a 46 y.o. female who returns to the [facility] on 10/10/2016 in re-evaluation of the following:  HPI: Victoria Hall returns to this clinic in evaluation of a multitude of different problems addressed during her initial evaluation of 09/12/2016.  She is much better regarding her eyes and nose. She has not been having any recent sore throats or throat clearing or drainage in her throat. She has not been having any rashes or flushing. She has noticed a dramatic decrease in her diarrhea and in fact has only had 2 episodes of diarrhea both associated with the consumption of barbecue pork and raw carrots. She has had a dramatic improvement in her night sweats and in fact has not been having any night sweats the past few weeks. She has not been having any heartburn or regurgitation.  Victoria Hall has stopped smoking. She is using a E cigarettes. She has decreased her caffeine by 50%.  She continues to consistently use loratadine and famotidine but has not started the montelukast.  Allergies as of 10/10/2016      Reactions   Clindamycin/lincomycin Itching, Other (See Comments)   Dizziness, trouble swallowing   Codeine Itching, Nausea And Vomiting   Cyclobenzaprine Other (See Comments)   Fast heartbeat, restless leg, nightmares   Gabapentin Other (See Comments)   Headaches, visual disturbances.    Nyquil Multi-symptom [pseudoeph-doxylamine-dm-apap] Other (See Comments)   Heart racing, dizziness, weakness   Uribel [meth-hyo-m Bl-na Phos-ph Sal] Other (See Comments)   Heart racing, dizziness, blurry vision.   Xanax [alprazolam]    Bruising      Medication List      diazepam 2 MG tablet Commonly known as:  VALIUM Take 2 mg by mouth every 12 (twelve) hours as needed.   famotidine 20 MG  tablet Commonly known as:  PEPCID Take 1 tablet (20 mg total) by mouth daily.   loratadine 10 MG tablet Commonly known as:  CLARITIN Take 10 mg by mouth daily.       Past Medical History:  Diagnosis Date  . Anxiety   . Arthritis   . Cervical intraepithelial neoplasia grade 3 2002   s/p LEEP Rx repeat pap negative  . Cervical radiculopathy   . Fallopian tube disorder    right fallopian tube removed  . Gallstones 09/2015   On CT  . Headache    Migraines  . Hemorrhagic ovarian cyst 02/08/2016  . Hydrosalpinx    [facility]  . Hydrosalpinx 02/08/2016  . Interstitial cystitis   . Neurological abnormality    Neg workup with MRI/MRA in 2007 WNL except decreased caliber MCA proximally, distal cavernous portion of left LCA which may represent true stenosis or technique on exam. Evaluated by Dr Linus Salmons.  . Palpitations   . Partial seizures (HCC)    questionable diag  . Pneumonia    walking pneumonia  . Polycystic ovary    multiple ovarian cysts removed  . S/P cervical discectomy    Dr Vertell Limber, anterior discectomy C5-C7  . S/P epidural steroid injection    Right hip  . Seizures ([place]) 2005   simple partial seizure disorder    Past Surgical History:  Procedure Laterality Date  . APPENDECTOMY  1986  . BUNIONECTOMY Left    bunion removal  . CERVICAL DISC SURGERY  2005  C5-C7  . COLONOSCOPY    . DENTAL SURGERY    . fallopian tue removed    . LAPAROSCOPIC BILATERAL SALPINGECTOMY Left 02/09/2016   Procedure: LAPAROSCOPIC LEFT  SALPINGECTOMY;  Surgeon: Janyth Contes, MD;  Location: [facility];  Service: Gynecology;  Laterality: Left;  . LAPAROSCOPIC LYSIS OF ADHESIONS  02/09/2016   Procedure: LAPAROSCOPIC LYSIS OF ADHESIONS;  Surgeon: Janyth Contes, MD;  Location: [facility];  Service: Gynecology;;  momentum to adenexa  . LAPAROSCOPIC OVARIAN CYSTECTOMY Right 02/09/2016   Procedure: LAPAROSCOPIC OVARIAN CYSTECTOMY;  Surgeon: Janyth Contes, MD;   Location: [facility];  Service: Gynecology;  Laterality: Right;  x2 to right ovary  . LEEP  1998  . OOPHORECTOMY Left 02/09/2016   Procedure: LEFT OOPHORECTOMY;  Surgeon: Janyth Contes, MD;  Location: [facility];  Service: Gynecology;  Laterality: Left;  . TONSILLECTOMY  2001  . UPPER GI ENDOSCOPY      Review of systems negative except as noted in HPI / PMHx or noted below:  Review of Systems  Constitutional: Negative.   HENT: Negative.   Eyes: Negative.   Respiratory: Negative.   Cardiovascular: Negative.   Gastrointestinal: Negative.   Genitourinary: Negative.   Musculoskeletal: Negative.   Skin: Negative.   Neurological: Negative.   Endo/Heme/Allergies: Negative.   Psychiatric/Behavioral: Negative.      Objective:   Vitals:   10/10/16 1534  BP: 118/68  Pulse: 76  Resp: 12          Physical Exam  Constitutional: She is well-developed, well-nourished, and in no distress.  HENT:  Head: Normocephalic.  Right Ear: Tympanic membrane, external ear and ear canal normal.  Left Ear: Tympanic membrane, external ear and ear canal normal.  Nose: Nose normal. No mucosal edema or rhinorrhea.  Mouth/Throat: Uvula is midline, oropharynx is clear and moist and mucous membranes are normal. No oropharyngeal exudate.  Eyes: Conjunctivae are normal.  Neck: Trachea normal. No tracheal tenderness present. No tracheal deviation present. No thyromegaly present.  Cardiovascular: Normal rate, regular rhythm, S1 normal, S2 normal and normal heart sounds.   No murmur heard. Pulmonary/Chest: Breath sounds normal. No stridor. No respiratory distress. She has no wheezes. She has no rales.  Musculoskeletal: She exhibits no edema.  Lymphadenopathy:       Head (right side): No tonsillar adenopathy present.       Head (left side): No tonsillar adenopathy present.    She has no cervical adenopathy.  Neurological: She is alert. Gait normal.  Skin: No rash noted. She is not diaphoretic. No erythema.  Nails show no clubbing.  Psychiatric: Mood and affect normal.    Diagnostics: Results of blood tests and urine tests obtained 04/16/2016 identified a normal SPEP without evidence of a monoclonal protein, tryptase 4.0 g/L, negative alpha gal panel, negative celiac screen with a total IgA of 200 MG/DL, negative Hymenoptera venom panel, negative 24 hour urine for 5-HIAA and histamine.  Assessment and Plan:   1. Other allergic rhinitis   2. Allergic urticaria   3. Flushing   4. Unexplained night sweats   5. Diarrhea, unspecified type   6. Hymenoptera allergy   7. Basophilia   8. Eosinophilia     1. Treat and prevent inflammation:   A. loratadine 20 mg tablet 1 time per day  B. famotidine 20 mg tablet 1 time per day  2. Return in 6 months or earlier if problem  Awa is really doing much better on her current medical therapy which includes an H1 and  H2 receptor blocker and has resolved almost all the issues with her airway and her flushing and her night sweats and her diarrhea. I don't think any further evaluation is indicated at this point in time for her eosinophilia and basophilia. I do not think that there is any indication for further evaluation for a history of Hymenoptera reaction as there was no evidence of any IgE antibodies directed against Hymenoptera on her blood tests. I will see her back in this clinic in 6 months or earlier if there is a problem. I have encouraged her to continue to remain away from tobacco smoke exposure and to also continue to consolidate her caffeine use.  Allena Katz, MD Allergy / [facility]ology [place]

## 2016-10-10 NOTE — Patient Instructions (Signed)
  1. Treat and prevent inflammation:   A. loratadine 20 mg tablet 1 time per day  B. famotidine 20 mg tablet 1 time per day  2. Return in 6 months or earlier if problem

## 2016-10-10 NOTE — Telephone Encounter (Signed)
Patient was seen today Can a new script for loratadine be called in Patient states in was increased but she want to take 10mg  twice daily One in the morning and one at night [facility] on [address]

## 2016-10-11 MED ORDER — LORATADINE 10 MG PO TABS: 10.0000 mg | ORAL_TABLET | Freq: Two times a day (BID) | ORAL | 5 refills | Status: DC

## 2016-10-11 NOTE — Telephone Encounter (Signed)
Script sent  

## 2016-10-11 NOTE — Telephone Encounter (Signed)
Called patient. I informed her that I sent script to the pharmacy.

## 2016-10-20 ENCOUNTER — Telehealth: Payer: Self-pay | Admitting: Allergy and Immunology

## 2016-10-20 NOTE — Telephone Encounter (Signed)
FYI: Called patient. Patient wanted to let us know that she went back to the name brand Claritin and she is doing 10 mg in the morning and 10 at night. Also patient wanted to let us know that the Pepcid is working great.

## 2016-10-20 NOTE — Telephone Encounter (Signed)
Pt called and had a question about the pepcid that she taking twice a day and the claritin 24 hours. [phone]

## 2017-01-01 ENCOUNTER — Encounter: Payer: Self-pay | Admitting: Family Medicine

## 2017-01-01 DIAGNOSIS — H9203 Otalgia, bilateral: Secondary | ICD-10-CM

## 2017-01-16 ENCOUNTER — Encounter: Payer: Self-pay | Admitting: Gastroenterology

## 2017-01-22 ENCOUNTER — Encounter: Payer: Self-pay | Admitting: Family Medicine

## 2017-01-25 ENCOUNTER — Encounter: Payer: Self-pay | Admitting: Family Medicine

## 2017-01-30 ENCOUNTER — Encounter: Payer: Self-pay | Admitting: Family Medicine

## 2017-02-02 ENCOUNTER — Encounter: Payer: Self-pay | Admitting: Family Medicine

## 2017-02-02 DIAGNOSIS — R21 Rash and other nonspecific skin eruption: Secondary | ICD-10-CM | POA: Diagnosis not present

## 2017-02-02 DIAGNOSIS — M2550 Pain in unspecified joint: Secondary | ICD-10-CM

## 2017-02-09 DIAGNOSIS — L308 Other specified dermatitis: Secondary | ICD-10-CM | POA: Diagnosis not present

## 2017-02-09 DIAGNOSIS — R21 Rash and other nonspecific skin eruption: Secondary | ICD-10-CM | POA: Diagnosis not present

## 2017-02-26 ENCOUNTER — Ambulatory Visit (INDEPENDENT_AMBULATORY_CARE_PROVIDER_SITE_OTHER): Payer: BLUE CROSS/BLUE SHIELD | Admitting: Family Medicine

## 2017-02-26 ENCOUNTER — Other Ambulatory Visit: Payer: Self-pay

## 2017-02-26 ENCOUNTER — Encounter (HOSPITAL_BASED_OUTPATIENT_CLINIC_OR_DEPARTMENT_OTHER): Payer: Self-pay

## 2017-02-26 ENCOUNTER — Emergency Department (HOSPITAL_BASED_OUTPATIENT_CLINIC_OR_DEPARTMENT_OTHER): Payer: BLUE CROSS/BLUE SHIELD

## 2017-02-26 ENCOUNTER — Encounter: Payer: Self-pay | Admitting: Family Medicine

## 2017-02-26 ENCOUNTER — Emergency Department (HOSPITAL_BASED_OUTPATIENT_CLINIC_OR_DEPARTMENT_OTHER)
Admission: EM | Admit: 2017-02-26 | Discharge: 2017-02-26 | Disposition: A | Discharge status: Home / Self Care | Payer: BLUE CROSS/BLUE SHIELD | Attending: Emergency Medicine | Admitting: Emergency Medicine

## 2017-02-26 VITALS — BP 110/76 | HR 105 | Temp 98.0°F | Ht 65.0 in | Wt 180.0 lb

## 2017-02-26 DIAGNOSIS — N3000 Acute cystitis without hematuria: Secondary | ICD-10-CM | POA: Insufficient documentation

## 2017-02-26 DIAGNOSIS — M5440 Lumbago with sciatica, unspecified side: Secondary | ICD-10-CM

## 2017-02-26 DIAGNOSIS — R1030 Lower abdominal pain, unspecified: Secondary | ICD-10-CM | POA: Diagnosis not present

## 2017-02-26 DIAGNOSIS — Z79899 Other long term (current) drug therapy: Secondary | ICD-10-CM | POA: Insufficient documentation

## 2017-02-26 DIAGNOSIS — F419 Anxiety disorder, unspecified: Secondary | ICD-10-CM | POA: Insufficient documentation

## 2017-02-26 DIAGNOSIS — M5432 Sciatica, left side: Secondary | ICD-10-CM | POA: Diagnosis not present

## 2017-02-26 DIAGNOSIS — D259 Leiomyoma of uterus, unspecified: Secondary | ICD-10-CM | POA: Diagnosis not present

## 2017-02-26 DIAGNOSIS — N938 Other specified abnormal uterine and vaginal bleeding: Secondary | ICD-10-CM | POA: Insufficient documentation

## 2017-02-26 DIAGNOSIS — F17210 Nicotine dependence, cigarettes, uncomplicated: Secondary | ICD-10-CM | POA: Insufficient documentation

## 2017-02-26 DIAGNOSIS — R102 Pelvic and perineal pain: Secondary | ICD-10-CM

## 2017-02-26 DIAGNOSIS — N939 Abnormal uterine and vaginal bleeding, unspecified: Secondary | ICD-10-CM | POA: Diagnosis not present

## 2017-02-26 DIAGNOSIS — M5442 Lumbago with sciatica, left side: Secondary | ICD-10-CM | POA: Diagnosis not present

## 2017-02-26 DIAGNOSIS — R109 Unspecified abdominal pain: Secondary | ICD-10-CM | POA: Diagnosis not present

## 2017-02-26 LAB — COMPREHENSIVE METABOLIC PANEL
ALT: 35 U/L (ref 14–54)
AST: 25 U/L (ref 15–41)
Albumin: 4.5 g/dL (ref 3.5–5.0)
Alkaline Phosphatase: 69 U/L (ref 38–126)
Anion gap: 5 (ref 5–15)
BUN: 8 mg/dL (ref 6–20)
CO2: 26 mmol/L (ref 22–32)
Calcium: 9.1 mg/dL (ref 8.9–10.3)
Chloride: 103 mmol/L (ref 101–111)
Creatinine, Ser: 0.66 mg/dL (ref 0.44–1.00)
GFR calc Af Amer: >60 mL/min (ref >60)
GFR calc non Af Amer: >60 mL/min (ref >60)
Glucose, Bld: 95 mg/dL (ref 65–99)
Potassium: 3.5 mmol/L (ref 3.5–5.1)
Sodium: 134 mmol/L — ABNORMAL LOW (ref 135–145)
Total Bilirubin: 0.4 mg/dL (ref 0.3–1.2)
Total Protein: 7.4 g/dL (ref 6.5–8.1)

## 2017-02-26 LAB — URINALYSIS, ROUTINE W REFLEX MICROSCOPIC
Bilirubin Urine: NEGATIVE
Glucose, UA: NEGATIVE mg/dL
Ketones, ur: NEGATIVE mg/dL
Leukocytes, UA: NEGATIVE
Nitrite: POSITIVE — AB
Protein, ur: NEGATIVE mg/dL
Specific Gravity, Urine: 1.02 (ref 1.005–1.030)
pH: 6 (ref 5.0–8.0)

## 2017-02-26 LAB — CBC WITH DIFFERENTIAL/PLATELET
Basophils Absolute: 0 10*3/uL (ref 0.0–0.1)
Basophils Relative: 0 %
Eosinophils Absolute: 0.5 10*3/uL (ref 0.0–0.7)
Eosinophils Relative: 3 %
HCT: 42.4 % (ref 36.0–46.0)
Hemoglobin: 14.7 g/dL (ref 12.0–15.0)
Lymphocytes Relative: 18 %
Lymphs Abs: 2.9 10*3/uL (ref 0.7–4.0)
MCH: 28.4 pg (ref 26.0–34.0)
MCHC: 34.7 g/dL (ref 30.0–36.0)
MCV: 81.9 fL (ref 78.0–100.0)
Monocytes Absolute: 1 10*3/uL (ref 0.1–1.0)
Monocytes Relative: 6 %
Neutro Abs: 11.6 10*3/uL — ABNORMAL HIGH (ref 1.7–7.7)
Neutrophils Relative %: 73 %
Platelets: 338 10*3/uL (ref 150–400)
RBC: 5.18 MIL/uL — ABNORMAL HIGH (ref 3.87–5.11)
RDW: 13.9 % (ref 11.5–15.5)
WBC: 16 10*3/uL — ABNORMAL HIGH (ref 4.0–10.5)

## 2017-02-26 LAB — POCT URINE PREGNANCY: Preg Test, Ur: NEGATIVE

## 2017-02-26 LAB — HCG, SERUM, QUALITATIVE: Preg, Serum: NEGATIVE

## 2017-02-26 LAB — POCT URINALYSIS DIPSTICK
Bilirubin, UA: NEGATIVE
Glucose, UA: NEGATIVE
Ketones, UA: NEGATIVE
Leukocytes, UA: NEGATIVE
Nitrite, UA: POSITIVE
Protein, UA: NEGATIVE
Spec Grav, UA: 1.02 (ref 1.010–1.025)
Urobilinogen, UA: 0.2 E.U./dL
pH, UA: 6 (ref 5.0–8.0)

## 2017-02-26 LAB — URINALYSIS, MICROSCOPIC (REFLEX)

## 2017-02-26 LAB — LIPASE, BLOOD: Lipase: 25 U/L (ref 11–51)

## 2017-02-26 MED ORDER — METHYLPREDNISOLONE 4 MG PO TBPK: ORAL_TABLET | ORAL | 0 refills | Status: DC

## 2017-02-26 MED ORDER — ORPHENADRINE CITRATE ER 100 MG PO TB12: 100.0000 mg | ORAL_TABLET | Freq: Two times a day (BID) | ORAL | 0 refills | Status: DC

## 2017-02-26 MED ORDER — CEPHALEXIN 500 MG PO CAPS: 1000.0000 mg | ORAL_CAPSULE | Freq: Two times a day (BID) | ORAL | 0 refills | Status: DC

## 2017-02-26 MED ORDER — CEPHALEXIN 250 MG PO CAPS
1000.0000 mg | ORAL_CAPSULE | Freq: Once | ORAL | Status: AC
  Administered 2017-02-26: 1000 mg via ORAL
  Filled 2017-02-26: qty 4

## 2017-02-26 NOTE — ED Triage Notes (Addendum)
C/o lower back with heavy vaginal bleeding-was sent from PCP-NAD-walking with own cane

## 2017-02-26 NOTE — ED Notes (Signed)
ED Provider at bedside. 

## 2017-02-26 NOTE — ED Notes (Signed)
Pt states she was sent by PCP "for a CT or US"-pt thought she was having test then leave, not ED pt-checked for OP imaging orders-none-pt agreed to be checked in to ED and be seen by ED provider

## 2017-02-26 NOTE — ED Notes (Signed)
Pt had a heavy period last Friday with back pain and last Saturday night, patient passed a large clots and the back pain went away.  The pain started again last that she could hardly walk d/t intense pain.

## 2017-02-26 NOTE — ED Provider Notes (Signed)
[facility] DEPARTMENT Provider Note   CSN: [id number] Arrival date & time: 02/26/17  1459     History   Chief Complaint Chief Complaint  Patient presents with  . Back Pain    HPI Victoria Hall is a 46 y.o. female.  HPI Was sent from PCPs office for further evaluation of lower abdominal pain with heavy vaginal bleeding and low back pain.  Patient reports that she was at the time of a normal menstrual cycle.  She reports her menstrual cycles however are getting heavier.  She reports that she had bled some and then the bleeding slowed down.  She reports then over the weekend a large clot passed with a lot of blood behind it.  She reports after that some of the back pain eased off the tip to what it had been.  Ports that she is always had problems with back pain but not typically her left low back.  Pain is aching and sharp in quality.  It does radiate into her leg.  He denies weakness or numbness.  No fever, vomiting, diarrhea or constipation. Past Medical History:  Diagnosis Date  . Anxiety   . Arthritis   . Cervical intraepithelial neoplasia grade 3 2002   s/p LEEP Rx repeat pap negative  . Cervical radiculopathy   . Fallopian tube disorder    right fallopian tube removed  . Gallstones 09/2015   On CT  . Headache    Migraines  . Hemorrhagic ovarian cyst 02/08/2016  . Hydrosalpinx    [facility]  . Hydrosalpinx 02/08/2016  . Interstitial cystitis   . Neurological abnormality    Neg workup with MRI/MRA in 2007 WNL except decreased caliber MCA proximally, distal cavernous portion of left LCA which may represent true stenosis or technique on exam. Evaluated by Dr Linus Salmons.  . Palpitations   . Partial seizures (HCC)    questionable diag  . Pneumonia    walking pneumonia  . Polycystic ovary    multiple ovarian cysts removed  . S/P cervical discectomy    Dr Vertell Limber, anterior discectomy C5-C7  . S/P epidural steroid injection    Right hip   . Seizures ([place]) 2005   simple partial seizure disorder    Patient Active Problem List   Diagnosis Date Noted  . Low back pain 07/21/2016  . Right knee pain 06/05/2016  . Left shoulder pain 06/05/2016  . Hydrosalpinx 02/08/2016  . Hemorrhagic ovarian cyst 02/08/2016  . Right hip pain 02/03/2016  . MUSCLE SPASM, BACK 06/08/2009  . HYDROSALPINX 02/07/2008  . HEADACHE 10/28/2007  . TACHYCARDIA 10/14/2007  . ANXIETY DISORDER 05/28/2007  . POLYARTHRITIS 05/09/2007  . UNSPECIFIED DISORDERS OF NERVOUS SYSTEM 04/22/2007  . ABDOMINAL PAIN RIGHT LOWER QUADRANT 04/08/2007  . PALPITATIONS, RECURRENT 11/07/2006  . PROBLEMS W/SMELL/TASTE 06/29/2006  . TOBACCO ABUSE 01/08/2006  . VISUAL IMPAIRMENT 01/08/2006  . DISTURBANCE, VISUAL NOS 01/08/2006  . FIBROCYSTIC BREAST DISEASE 01/08/2006  . [place] DISEASE, CERVICAL 01/08/2006  . VERTIGO 01/08/2006    Past Surgical History:  Procedure Laterality Date  . APPENDECTOMY  1986  . BUNIONECTOMY Left    bunion removal  . CERVICAL DISC SURGERY  2005   C5-C7  . COLONOSCOPY    . DENTAL SURGERY    . fallopian tue removed    . LAPAROSCOPIC BILATERAL SALPINGECTOMY Left 02/09/2016   Procedure: LAPAROSCOPIC LEFT  SALPINGECTOMY;  Surgeon: Janyth Contes, MD;  Location: [facility];  Service: Gynecology;  Laterality: Left;  .  LAPAROSCOPIC LYSIS OF ADHESIONS  02/09/2016   Procedure: LAPAROSCOPIC LYSIS OF ADHESIONS;  Surgeon: Janyth Contes, MD;  Location: [facility];  Service: Gynecology;;  momentum to adenexa  . LAPAROSCOPIC OVARIAN CYSTECTOMY Right 02/09/2016   Procedure: LAPAROSCOPIC OVARIAN CYSTECTOMY;  Surgeon: Janyth Contes, MD;  Location: [facility];  Service: Gynecology;  Laterality: Right;  x2 to right ovary  . LEEP  1998  . OOPHORECTOMY Left 02/09/2016   Procedure: LEFT OOPHORECTOMY;  Surgeon: Janyth Contes, MD;  Location: [facility];  Service: Gynecology;  Laterality: Left;  . TONSILLECTOMY  2001  . UPPER GI ENDOSCOPY      OB History     No data available       Home Medications    Prior to Admission medications   Medication Sig Start Date End Date Taking? Authorizing Provider  cephALEXin (KEFLEX) 500 MG capsule Take 2 capsules (1,000 mg total) by mouth 2 (two) times daily. 02/26/17   Charlesetta Shanks, MD  diazepam (VALIUM) 2 MG tablet Take 2 mg by mouth every 12 (twelve) hours as needed.  07/05/16 07/05/17  [provider]  famotidine (PEPCID) 20 MG tablet Take 1 tablet (20 mg total) by mouth daily. 09/12/16   Kozlow, Donnamarie Poag, MD  loratadine (CLARITIN) 10 MG tablet Take 10 mg by mouth daily.    [provider]  methylPREDNISolone (MEDROL DOSEPAK) 4 MG TBPK tablet Per dose pack instructions 02/26/17   Charlesetta Shanks, MD  orphenadrine (NORFLEX) 100 MG tablet Take 1 tablet (100 mg total) by mouth 2 (two) times daily. 02/26/17   Charlesetta Shanks, MD    Family History Family History  Adopted: Yes  Problem Relation Age of Onset  . COPD Mother   . Breast cancer Mother   . Other Mother        Teeth problems- all removed by age 25  . Rashes / Skin problems Sister   . Other Sister        Teeth problems- all removed by age 44  . Rashes / Skin problems Brother   . Asthma Brother   . Other Brother        Teeth problems- all removed by age 91    Social History Social History   Tobacco Use  . Smoking status: Current Every Day Smoker    Packs/day: 0.00    Years: 0.00    Pack years: 0.00    Types: Cigarettes    Last attempt to quit: 11/02/2015    Years since quitting: 1.3  . Smokeless tobacco: Never Used  . Tobacco comment: currently smoking, began at beginning of June and plans to quit on June 14th. Has smoked off and on in the past.  Substance Use Topics  . Alcohol use: No    Alcohol/week: 0.6 - 1.2 oz    Types: 1 - 2 Standard drinks or equivalent per week    Comment: occasionally 0-2 per day  . Drug use: No     Allergies   Clindamycin/lincomycin; Codeine; Cyclobenzaprine; Gabapentin; Nyquil  multi-symptom [pseudoeph-doxylamine-dm-apap]; Uribel [meth-hyo-m bl-na phos-ph sal]; and Xanax [alprazolam]   Review of Systems Review of Systems 10 Systems reviewed and are negative for acute change except as noted in the HPI.   Physical Exam Updated Vital Signs BP 105/83 (BP Location: Left Arm)   Pulse 86   Temp 98.1 F (36.7 C) (Oral)   Resp 18   LMP 02/23/2017 (Exact Date)   SpO2 98%   Physical Exam  Constitutional: She is oriented to person,  place, and time. She appears well-developed and well-nourished. No distress.  HENT:  Head: Normocephalic and atraumatic.  Eyes: Conjunctivae and EOM are normal.  Neck: Neck supple.  Cardiovascular: Normal rate, regular rhythm, normal heart sounds and intact distal pulses.  No murmur heard. Pulmonary/Chest: Effort normal and breath sounds normal. No respiratory distress.  Abdominal: Soft. There is tenderness.  Patient endorses tenderness in the right and left lower quadrants.Most  Tender deep left lower quadrant.  Musculoskeletal: She exhibits no edema.  Reproducible pain at left SI joint.  Patient ambulates with antalgic gait.  Negative straight leg raise on the left.  No numbness or weakness to lower extremities.  Neurological: She is alert and oriented to person, place, and time. No cranial nerve deficit. She exhibits normal muscle tone. Coordination normal.  Skin: Skin is warm and dry.  Psychiatric: She has a normal mood and affect.  Nursing note and vitals reviewed.    ED Treatments / Results  Labs (all labs ordered are listed, but only abnormal results are displayed) Labs Reviewed  COMPREHENSIVE METABOLIC PANEL - Abnormal; Notable for the following components:      Result Value   Sodium 134 (*)    All other components within normal limits  CBC WITH DIFFERENTIAL/PLATELET - Abnormal; Notable for the following components:   WBC 16.0 (*)    RBC 5.18 (*)    Neutro Abs 11.6 (*)    All other components within normal limits    URINALYSIS, ROUTINE W REFLEX MICROSCOPIC - Abnormal; Notable for the following components:   APPearance CLOUDY (*)    Hgb urine dipstick SMALL (*)    Nitrite POSITIVE (*)    All other components within normal limits  URINALYSIS, MICROSCOPIC (REFLEX) - Abnormal; Notable for the following components:   Bacteria, UA MANY (*)    Squamous Epithelial / LPF 0-5 (*)    All other components within normal limits  URINE CULTURE  LIPASE, BLOOD  HCG, SERUM, QUALITATIVE    EKG  EKG Interpretation None       Radiology [place] Art/ven Flow Abd Pelv Doppler  Result Date: 02/26/2017 CLINICAL DATA:  Initial evaluation for acute bilateral adnexal pain. History of previous bilateral salpingectomy. EXAM: TRANSABDOMINAL AND TRANSVAGINAL ULTRASOUND OF PELVIS DOPPLER ULTRASOUND OF OVARIES TECHNIQUE: Both transabdominal and transvaginal ultrasound examinations of the pelvis were performed. Transabdominal technique was performed for global imaging of the pelvis including uterus, ovaries, adnexal regions, and pelvic cul-de-sac. It was necessary to proceed with endovaginal exam following the transabdominal exam to visualize the uterus and ovaries. Color and duplex Doppler ultrasound was utilized to evaluate blood flow to the ovaries. COMPARISON:  None. FINDINGS: Uterus Measurements: 8.7 x 4.2 x 5.5 cm. Intramural fibroid at the uterine fundus measured 2.2 x 1.9 x 2.6 cm. Second intramural fibroid positioned at the mid posterior uterine body near the lower uterine segment measured 0.9 x 0.7 x 1.0 cm. Endometrium Thickness: 4.5 mm.  No focal abnormality visualized. Right ovary Measurements: 3.2 x 2.2 x 1.9 cm. Few small physiologic cysts measuring up to 1.7 cm noted. No worrisome adnexal mass. Left ovary Measurements: 3.3 x 1.8 x 2.6 cm. Simple physiologic cyst measuring approximately 2 cm noted. No concerning adnexal mass. Pulsed Doppler evaluation demonstrates normal low-resistance arterial and venous waveforms in both  ovaries. Other findings No abnormal free fluid. IMPRESSION: 1. Fibroid uterus as above. 2. Normal physiologic cysts involving the bilateral ovaries as above. 3. No other acute abnormality within the pelvis. No evidence for torsion. Electronically  Signed   By: Jeannine Boga M.D.   On: 02/26/2017 19:19   Ct Renal Stone Study  Result Date: 02/26/2017 CLINICAL DATA:  Left-sided flank pain EXAM: CT ABDOMEN AND PELVIS WITHOUT CONTRAST TECHNIQUE: Multidetector CT imaging of the abdomen and pelvis was performed following the standard protocol without IV contrast. COMPARISON:  05/31/2007 FINDINGS: Lower chest: Lung bases demonstrate no acute consolidation or effusion. Normal heart size. Hepatobiliary: No focal liver abnormality is seen. No gallstones, gallbladder wall thickening, or biliary dilatation. Pancreas: Unremarkable. No pancreatic ductal dilatation or surrounding inflammatory changes. Spleen: Normal in size without focal abnormality. Adrenals/Urinary Tract: Adrenal glands are unremarkable. Kidneys are normal, without renal calculi, focal lesion, or hydronephrosis. Bladder is unremarkable. Stomach/Bowel: Stomach is within normal limits. Appendix nonvisualized consistent with given history of appendectomy. No evidence of bowel wall thickening, distention, or inflammatory changes. Sigmoid colon diverticula without acute inflammation Vascular/Lymphatic: No significant vascular findings are present. No enlarged abdominal or pelvic lymph nodes. Reproductive: Uterus and bilateral adnexa are unremarkable. Other: Negative for free air or free fluid. Musculoskeletal: No acute or significant osseous findings. IMPRESSION: No CT evidence for acute intra-abdominal or pelvic abnormality. Few sigmoid colon diverticula without inflammation Electronically Signed   By: Donavan Foil M.D.   On: 02/26/2017 20:33   US Pelvic Complete With Transvaginal  Result Date: 02/26/2017 CLINICAL DATA:  Initial evaluation for acute  bilateral adnexal pain. History of previous bilateral salpingectomy. EXAM: TRANSABDOMINAL AND TRANSVAGINAL ULTRASOUND OF PELVIS DOPPLER ULTRASOUND OF OVARIES TECHNIQUE: Both transabdominal and transvaginal ultrasound examinations of the pelvis were performed. Transabdominal technique was performed for global imaging of the pelvis including uterus, ovaries, adnexal regions, and pelvic cul-de-sac. It was necessary to proceed with endovaginal exam following the transabdominal exam to visualize the uterus and ovaries. Color and duplex Doppler ultrasound was utilized to evaluate blood flow to the ovaries. COMPARISON:  None. FINDINGS: Uterus Measurements: 8.7 x 4.2 x 5.5 cm. Intramural fibroid at the uterine fundus measured 2.2 x 1.9 x 2.6 cm. Second intramural fibroid positioned at the mid posterior uterine body near the lower uterine segment measured 0.9 x 0.7 x 1.0 cm. Endometrium Thickness: 4.5 mm.  No focal abnormality visualized. Right ovary Measurements: 3.2 x 2.2 x 1.9 cm. Few small physiologic cysts measuring up to 1.7 cm noted. No worrisome adnexal mass. Left ovary Measurements: 3.3 x 1.8 x 2.6 cm. Simple physiologic cyst measuring approximately 2 cm noted. No concerning adnexal mass. Pulsed Doppler evaluation demonstrates normal low-resistance arterial and venous waveforms in both ovaries. Other findings No abnormal free fluid. IMPRESSION: 1. Fibroid uterus as above. 2. Normal physiologic cysts involving the bilateral ovaries as above. 3. No other acute abnormality within the pelvis. No evidence for torsion. Electronically Signed   By: Jeannine Boga M.D.   On: 02/26/2017 19:19    Procedures Procedures (including critical care time)  Medications Ordered in ED Medications  cephALEXin (KEFLEX) capsule 1,000 mg (1,000 mg Oral Given 02/26/17 2056)     Initial Impression / Assessment and Plan / ED Course  I have reviewed the triage vital signs and the nursing notes.  Pertinent labs & imaging  results that were available during my care of the patient were reviewed by me and considered in my medical decision making (see chart for details).     Final Clinical Impressions(s) / ED Diagnoses   Final diagnoses:  Acute cystitis without hematuria  Sciatica of left side  Pelvic pain in female  DUB (dysfunctional uterine bleeding)  Patient first had  very heavy uterine bleeding and pelvic pain.  She reportedly had history of hemorrhagic ovarian cyst.  Pelvic ultrasound obtained to rule out torsion or hemorrhagic cyst.  Pelvic ultrasound shows fibroids but no other urgent or emergent finding of hemorrhagic cysts or torsion.  CT also obtained to rule out stone or intra-abdominal etiology.  Study is unremarkable.  Patient does have UTI.  Will treat with Keflex.  Has had problems with back pain this appears most consistent to be sciatica in the terms of the back pain she is experiencing.  Will trial Medrol Dosepak and Norflex.  Patient will follow-up with PCP and GYN.  ED Discharge Orders        Ordered    cephALEXin (KEFLEX) 500 MG capsule  2 times daily     02/26/17 2112    orphenadrine (NORFLEX) 100 MG tablet  2 times daily     02/26/17 2112    methylPREDNISolone (MEDROL DOSEPAK) 4 MG TBPK tablet     02/26/17 2112       Charlesetta Shanks, MD 02/26/17 2120

## 2017-02-26 NOTE — Progress Notes (Signed)
Victoria Hall at [facility] [address], [place], [place] [address] [phone] [id number]  Date:  02/26/2017   Name:  Victoria Hall   DOB:  1970-10-03   MRN:  [id number]  PCP:  Victoria Mclean, MD    Chief Complaint: Back Pain (Onset about 5 days ago ); Menorrhagia (States she's had the heaviest cycle of her life. Soaking through pads, clotting, needs to discuss with provider. ); and Abdominal Cramping   History of Present Illness:  Victoria Hall is a 46 y.o. very pleasant female patient who presents with the following:  Last seen by myself in February of this year with palpations She noted onset of left lower back pain- this pain started about 5 days ago.  She thought it might be her SI joint She tried heat, ice etc but it did not help She decided to come in as this was not getting better.  However in the meatime she also started having heavy menstrual bleeding and pelvic/abd pain She started her menses on 11/23- a couple of days after the pain started  She notes that her pain was getting "worse and worse, it was getting hard to even walk across the room."  She also would notice pain if she would sneeze, cough or have a BM  She has had heavier menses over the last few months- she may "soak through an extra heavy pad in like 20 minutes." this past Saturday evening she had worsening pain, passed a big clot and "filled the toilet with blood" and and then her pain and bleeding subsided somewhat  Her back pain got better after she passed this clot and felt better yesterday However it came back again last night but is not as severe as it was  She is not having any vomiting but she is having belly pain with pressing on her belly   She had a left salpingectomy last year, she does still have her left ovary.  She was seen by GYN first-  She thinks Victoria Hall.  She thought she was having her left tube and ovary removed- HPI from Victoria. Melba Hall-  Sutckert: 02/08/16: Victoria Hall is an 46 y.o. female G85 with L sided pain, known hydrosapinx and 2x3.5 cm, likely hemorrhagic, ovarian cyst.  Previously had R hydrosalpinx.  D/W pt r/b/a of L/S removal, pt requests LSO.  However apparently she then went to [facility] for an [place] and was told that her left ovary was still present?  No fever She has felt dizzy and nauseated a bit  She has not noted any dysuria, but she did have dark urine on a couple of occasions last week, now resolved    Patient Active Problem List   Diagnosis Date Noted  . Low back pain 07/21/2016  . Right knee pain 06/05/2016  . Left shoulder pain 06/05/2016  . Hydrosalpinx 02/08/2016  . Hemorrhagic ovarian cyst 02/08/2016  . Right hip pain 02/03/2016  . MUSCLE SPASM, BACK 06/08/2009  . HYDROSALPINX 02/07/2008  . HEADACHE 10/28/2007  . TACHYCARDIA 10/14/2007  . ANXIETY DISORDER 05/28/2007  . POLYARTHRITIS 05/09/2007  . UNSPECIFIED DISORDERS OF NERVOUS SYSTEM 04/22/2007  . ABDOMINAL PAIN RIGHT LOWER QUADRANT 04/08/2007  . PALPITATIONS, RECURRENT 11/07/2006  . PROBLEMS W/SMELL/TASTE 06/29/2006  . TOBACCO ABUSE 01/08/2006  . VISUAL IMPAIRMENT 01/08/2006  . DISTURBANCE, VISUAL NOS 01/08/2006  . FIBROCYSTIC BREAST DISEASE 01/08/2006  . [place] DISEASE, CERVICAL 01/08/2006  . VERTIGO 01/08/2006  Past Medical History:  Diagnosis Date  . Anxiety   . Arthritis   . Cervical intraepithelial neoplasia grade 3 2002   s/p LEEP Rx repeat pap negative  . Cervical radiculopathy   . Fallopian tube disorder    right fallopian tube removed  . Gallstones 09/2015   On CT  . Headache    Migraines  . Hemorrhagic ovarian cyst 02/08/2016  . Hydrosalpinx    [facility]  . Hydrosalpinx 02/08/2016  . Interstitial cystitis   . Neurological abnormality    Neg workup with MRI/MRA in 2007 WNL except decreased caliber MCA proximally, distal cavernous portion of left LCA which may represent true stenosis or technique  on exam. Evaluated by Victoria Hall.  . Palpitations   . Partial seizures (HCC)    questionable diag  . Pneumonia    walking pneumonia  . Polycystic ovary    multiple ovarian cysts removed  . S/P cervical discectomy    Victoria Hall, anterior discectomy C5-C7  . S/P epidural steroid injection    Right hip  . Seizures ([place]) 2005   simple partial seizure disorder    Past Surgical History:  Procedure Laterality Date  . APPENDECTOMY  1986  . BUNIONECTOMY Left    bunion removal  . CERVICAL DISC SURGERY  2005   C5-C7  . COLONOSCOPY    . DENTAL SURGERY    . fallopian tue removed    . LAPAROSCOPIC BILATERAL SALPINGECTOMY Left 02/09/2016   Procedure: LAPAROSCOPIC LEFT  SALPINGECTOMY;  Surgeon: Victoria Contes, MD;  Location: [facility];  Service: Hall;  Laterality: Left;  . LAPAROSCOPIC LYSIS OF ADHESIONS  02/09/2016   Procedure: LAPAROSCOPIC LYSIS OF ADHESIONS;  Surgeon: Victoria Contes, MD;  Location: [facility];  Service: Hall;;  momentum to adenexa  . LAPAROSCOPIC OVARIAN CYSTECTOMY Right 02/09/2016   Procedure: LAPAROSCOPIC OVARIAN CYSTECTOMY;  Surgeon: Victoria Contes, MD;  Location: [facility];  Service: Hall;  Laterality: Right;  x2 to right ovary  . LEEP  1998  . OOPHORECTOMY Left 02/09/2016   Procedure: LEFT OOPHORECTOMY;  Surgeon: Victoria Contes, MD;  Location: [place] [facility];  Service: Hall;  Laterality: Left;  . TONSILLECTOMY  2001  . UPPER GI ENDOSCOPY      Social History   Tobacco Use  . Smoking status: Current Every Day Smoker    Packs/day: 0.00    Years: 0.00    Pack years: 0.00    Types: Cigarettes    Last attempt to quit: 11/02/2015    Years since quitting: 1.3  . Smokeless tobacco: Never Used  . Tobacco comment: currently smoking, began at beginning of June and plans to quit on June 14th. Has smoked off and on in the past.  Substance Use Topics  . Alcohol use: No    Alcohol/week: 0.6 - 1.2 oz    Types: 1 - 2 Standard drinks or equivalent per  week    Comment: occasionally 0-2 per day  . Drug use: No    Family History  Adopted: Yes  Problem Relation Age of Onset  . COPD Mother   . Breast cancer Mother   . Other Mother        Teeth problems- all removed by age 71  . Rashes / Skin problems Sister   . Other Sister        Teeth problems- all removed by age 28  . Rashes / Skin problems Brother   . Asthma Brother   . Other Brother  Teeth problems- all removed by age 102    Allergies  Allergen Reactions  . Clindamycin/Lincomycin Itching and Other (See Comments)    Dizziness, trouble swallowing  . Codeine Itching and Nausea And Vomiting  . Cyclobenzaprine Other (See Comments)    Fast heartbeat, restless leg, nightmares  . Gabapentin Other (See Comments)    Headaches, visual disturbances.   . Nyquil Multi-Symptom [Pseudoeph-Doxylamine-Dm-Apap] Other (See Comments)    Heart racing, dizziness, weakness  . Uribel [Meth-Hyo-M Bl-Na Phos-Ph Sal] Other (See Comments)    Heart racing, dizziness, blurry vision.  . Xanax [Alprazolam]     Bruising    Medication list has been reviewed and updated.  Current Outpatient Medications on File Prior to Visit  Medication Sig Dispense Refill  . famotidine (PEPCID) 20 MG tablet Take 1 tablet (20 mg total) by mouth daily. 30 tablet 5  . loratadine (CLARITIN) 10 MG tablet Take 10 mg by mouth daily.    . diazepam (VALIUM) 2 MG tablet Take 2 mg by mouth every 12 (twelve) hours as needed.      No current facility-administered medications on file prior to visit.     Review of Systems:  As per HPI- otherwise negative.   Physical Examination: Vitals:   02/26/17 1407  BP: 110/76  Pulse: (!) 105  Temp: 98 F (36.7 C)  SpO2: 97%   Vitals:   02/26/17 1407  Weight: 180 lb (81.6 kg)  Height: 5\' 5"  (1.651 m)   Body mass index is 29.95 kg/m. Ideal Body Weight: Weight in (lb) to have BMI = 25: 149.9  GEN: WDWN, NAD, Non-toxic, A & O x 3, overweight, looks well HEENT:  Atraumatic, Normocephalic. Neck supple. No masses, No LAD.  Bilateral TM wnl, oropharynx normal.  PEERL,EOMI.   Ears and Nose: No external deformity. CV: RRR, No M/G/R. No JVD. No thrill. No extra heart sounds. PULM: CTA B, no wheezes, crackles, rhonchi. No retractions. No resp. distress. No accessory muscle use. ABD: pt displays guarding over her uterus and in the left lower quadrant.  No masses noted. Pt feels that her belly is distended and tender when palpated as above EXTR: No c/c/e NEURO Normal gait for pt, she does use a cane PSYCH: Normally interactive. Conversant. Not depressed or anxious appearing.  Calm demeanor.  GU: no bleeding from cervix at this time/ no vaginal or vulvar masses or lesions.  Uterus seems tender to palpation Results for orders placed or performed in visit on 02/26/17  POCT Urinalysis Dipstick  Result Value Ref Range   Color, UA yellow    Clarity, UA cloudy    Glucose, UA neg    Bilirubin, UA neg    Ketones, UA neg    Spec Grav, UA 1.020 1.010 - 1.025   Blood, UA 1+    pH, UA 6.0 5.0 - 8.0   Protein, UA neg    Urobilinogen, UA 0.2 0.2 or 1.0 E.U./dL   Nitrite, UA positivie    Leukocytes, UA Negative Negative  POCT urine pregnancy  Result Value Ref Range   Preg Test, Ur Negative Negative     Assessment and Plan: Acute right-sided low back pain with sciatica, sciatica laterality unspecified - Plan: POCT Urinalysis Dipstick  Episode of heavy vaginal bleeding - Plan: POCT urine pregnancy  Lower abdominal pain  Here today with several days of back pain, belly pain and menorrhagia History of left ovarian hemorrhagic cyst- pt thought that she had her left ovary removed - however she then  had an [place] per [facility] in March of this year as follows: looks like left ovary is still present  FINDINGS:  Uterus measures 8.7 x 5.7 x 3.9 cm. No focal myometrial mass was seen.The endometrium was normal in thickness.  The right ovary is unremarkable, with small  follicle.Multiple echogenic foci within the left ovary, which may reflect sequela of prior salpingectomy and left cyst removal per Epic notes. Appropriate flow was seen in both ovaries with color and spectral doppler.  No free pelvic fluid.  In any case she has significant belly pain today and likely needs imaging and labs.  Referral to ER, appreciate their care of this patient today  Signed Lamar Blinks, MD

## 2017-02-27 ENCOUNTER — Encounter: Payer: Self-pay | Admitting: Family Medicine

## 2017-02-27 DIAGNOSIS — D72829 Elevated white blood cell count, unspecified: Secondary | ICD-10-CM

## 2017-02-28 ENCOUNTER — Other Ambulatory Visit: Payer: Self-pay | Admitting: Allergy and Immunology

## 2017-02-28 NOTE — Addendum Note (Signed)
Addended by: Lamar Blinks C on: 02/28/2017 08:21 PM   Modules accepted: Orders

## 2017-02-28 NOTE — Telephone Encounter (Signed)
Received fax for 90 day supply for montelukast. Patient was last seen 10/2016 however this medication was not in discharge instructions and patient stated she was not taking it. Request not sent in patient should call office.

## 2017-03-01 ENCOUNTER — Other Ambulatory Visit: Payer: Self-pay | Admitting: Allergy and Immunology

## 2017-03-01 LAB — URINE CULTURE: Culture: >=100000 — AB

## 2017-03-01 MED ORDER — FAMOTIDINE 20 MG PO TABS: 20.0000 mg | ORAL_TABLET | Freq: Every day | ORAL | 0 refills | Status: DC

## 2017-03-01 MED ORDER — MONTELUKAST SODIUM 10 MG PO TABS: 10.0000 mg | ORAL_TABLET | Freq: Every day | ORAL | 0 refills | Status: DC

## 2017-03-01 NOTE — Telephone Encounter (Signed)
Received fax for 90 day supply for montelukast and famotidine. Patient was last seen 10/10/2016. Refill sent in patient will need office for further refills.

## 2017-03-02 ENCOUNTER — Encounter: Payer: Self-pay | Admitting: Family Medicine

## 2017-03-02 ENCOUNTER — Telehealth: Payer: Self-pay | Admitting: *Deleted

## 2017-03-02 NOTE — Telephone Encounter (Signed)
Post ED Visit - Positive Culture Follow-up  Culture report reviewed by antimicrobial stewardship pharmacist:  []  Elenor Quinones, Pharm.D. []  Heide Guile, Pharm.D., BCPS AQ-ID []  Parks Neptune, Pharm.D., BCPS []  Alycia Rossetti, Pharm.D., BCPS []  [place], Pharm.D., BCPS, AAHIVP []  Legrand Como, Pharm.D., BCPS, AAHIVP []  Salome Arnt, PharmD, BCPS []  Dimitri Ped, PharmD, BCPS []  Vincenza Hews, PharmD, BCPS Ulanda Edison, PharmD  Positive urine culture Treated with Cephalexin, organism sensitive to the same and no further patient follow-up is required at this time.  Harlon Flor [facility] 03/02/2017, 11:43 AM

## 2017-03-06 NOTE — Progress Notes (Addendum)
[place] at [facility] [address] [place], [place] [address] [phone]  Date:  03/07/2017   Name:  Victoria Hall   DOB:  1970-10-19   MRN:  [id number]  PCP:  Darreld Mclean, MD    Chief Complaint: Hospitalization Follow-up (Pt here for hosp f/u visit)   History of Present Illness:  ANALEAH Hall is a 46 y.o. very pleasant female patient who presents with the following:  She was in on 11/26 with belly/ back pain and I ended up referring her to the ER, where she had eval including [place] and CT. She was dx with a UTI and treated with keflex.  Her urine culture showed E coli sensitive to keflex- she is taking keflex and seems to be responding to it well ER notes as below:  Patient first had very heavy uterine bleeding and pelvic pain.  She reportedly had history of hemorrhagic ovarian cyst.  Pelvic ultrasound obtained to rule out torsion or hemorrhagic cyst.  Pelvic ultrasound shows fibroids but no other urgent or emergent finding of hemorrhagic cysts or torsion.  CT also obtained to rule out stone or intra-abdominal etiology.  Study is unremarkable.  Patient does have UTI.  Will treat with Keflex.  Has had problems with back pain this appears most consistent to be sciatica in the terms of the back pain she is experiencing.  Will trial Medrol Dosepak and Norflex.  Patient will follow-up with PCP and GYN.   [place] Art/ven Flow Abd Pelv Doppler  Result Date: 02/26/2017 CLINICAL DATA:  Initial evaluation for acute bilateral adnexal pain. History of previous bilateral salpingectomy. EXAM: TRANSABDOMINAL AND TRANSVAGINAL ULTRASOUND OF PELVIS DOPPLER ULTRASOUND OF OVARIES TECHNIQUE: Both transabdominal and transvaginal ultrasound examinations of the pelvis were performed. Transabdominal technique was performed for global imaging of the pelvis including uterus, ovaries, adnexal regions, and pelvic cul-de-sac. It was necessary to proceed with  endovaginal exam following the transabdominal exam to visualize the uterus and ovaries. Color and duplex Doppler ultrasound was utilized to evaluate blood flow to the ovaries. COMPARISON:  None. FINDINGS: Uterus Measurements: 8.7 x 4.2 x 5.5 cm. Intramural fibroid at the uterine fundus measured 2.2 x 1.9 x 2.6 cm. Second intramural fibroid positioned at the mid posterior uterine body near the lower uterine segment measured 0.9 x 0.7 x 1.0 cm. Endometrium Thickness: 4.5 mm.  No focal abnormality visualized. Right ovary Measurements: 3.2 x 2.2 x 1.9 cm. Few small physiologic cysts measuring up to 1.7 cm noted. No worrisome adnexal mass. Left ovary Measurements: 3.3 x 1.8 x 2.6 cm. Simple physiologic cyst measuring approximately 2 cm noted. No concerning adnexal mass. Pulsed Doppler evaluation demonstrates normal low-resistance arterial and venous waveforms in both ovaries. Other findings No abnormal free fluid. IMPRESSION: 1. Fibroid uterus as above. 2. Normal physiologic cysts involving the bilateral ovaries as above. 3. No other acute abnormality within the pelvis. No evidence for torsion. Electronically Signed   By: Jeannine Boga M.D.   On: 02/26/2017 19:19   Ct Renal Stone Study  Result Date: 02/26/2017 CLINICAL DATA:  Left-sided flank pain EXAM: CT ABDOMEN AND PELVIS WITHOUT CONTRAST TECHNIQUE: Multidetector CT imaging of the abdomen and pelvis was performed following the standard protocol without IV contrast. COMPARISON:  05/31/2007 FINDINGS: Lower chest: Lung bases demonstrate no acute consolidation or effusion. Normal heart size. Hepatobiliary: No focal liver abnormality is seen. No gallstones, gallbladder wall thickening, or biliary dilatation. Pancreas: Unremarkable. No pancreatic  ductal dilatation or surrounding inflammatory changes. Spleen: Normal in size without focal abnormality. Adrenals/Urinary Tract: Adrenal glands are unremarkable. Kidneys are normal, without renal calculi, focal lesion,  or hydronephrosis. Bladder is unremarkable. Stomach/Bowel: Stomach is within normal limits. Appendix nonvisualized consistent with given history of appendectomy. No evidence of bowel wall thickening, distention, or inflammatory changes. Sigmoid colon diverticula without acute inflammation Vascular/Lymphatic: No significant vascular findings are present. No enlarged abdominal or pelvic lymph nodes. Reproductive: Uterus and bilateral adnexa are unremarkable. Other: Negative for free air or free fluid. Musculoskeletal: No acute or significant osseous findings. IMPRESSION: No CT evidence for acute intra-abdominal or pelvic abnormality. Few sigmoid colon diverticula without inflammation Electronically Signed   By: Donavan Foil M.D.   On: 02/26/2017 20:33   US Pelvic Complete With Transvaginal  Result Date: 02/26/2017 CLINICAL DATA:  Initial evaluation for acute bilateral adnexal pain. History of previous bilateral salpingectomy. EXAM: TRANSABDOMINAL AND TRANSVAGINAL ULTRASOUND OF PELVIS DOPPLER ULTRASOUND OF OVARIES TECHNIQUE: Both transabdominal and transvaginal ultrasound examinations of the pelvis were performed. Transabdominal technique was performed for global imaging of the pelvis including uterus, ovaries, adnexal regions, and pelvic cul-de-sac. It was necessary to proceed with endovaginal exam following the transabdominal exam to visualize the uterus and ovaries. Color and duplex Doppler ultrasound was utilized to evaluate blood flow to the ovaries. COMPARISON:  None. FINDINGS: Uterus Measurements: 8.7 x 4.2 x 5.5 cm. Intramural fibroid at the uterine fundus measured 2.2 x 1.9 x 2.6 cm. Second intramural fibroid positioned at the mid posterior uterine body near the lower uterine segment measured 0.9 x 0.7 x 1.0 cm. Endometrium Thickness: 4.5 mm.  No focal abnormality visualized. Right ovary Measurements: 3.2 x 2.2 x 1.9 cm. Few small physiologic cysts measuring up to 1.7 cm noted. No worrisome adnexal mass.  Left ovary Measurements: 3.3 x 1.8 x 2.6 cm. Simple physiologic cyst measuring approximately 2 cm noted. No concerning adnexal mass. Pulsed Doppler evaluation demonstrates normal low-resistance arterial and venous waveforms in both ovaries. Other findings No abnormal free fluid. IMPRESSION: 1. Fibroid uterus as above. 2. Normal physiologic cysts involving the bilateral ovaries as above. 3. No other acute abnormality within the pelvis. No evidence for torsion. Electronically Signed   By: Jeannine Boga M.D.   On: 02/26/2017 19:19   Pt notes that she started on a medrol dosepack from the ER- this made her feel much better within a couple of hours. As she tapered off the pack her baseline body pains did come back and she again had all of her joint pains.  She notes that she felt so much better while she was on the prednisone that she felt like a different person.  Her abd discomfort and bloating, and facial rash that she often notes cleared up as well Her fiance noted that she had a lot more energy, she was able to be much more active. "I didn't realize how much pain I have been in until it was taken away."  Her baseline body pains have not returned to normal level  She had tried gabapentin but this did not help her and gave her bad SE.   She saw Dr. Earnest Conroy with [facility]- was not thought to have a rheumatological disorder.  However she has decided to seek a 2nd opinion, and has an appt at at [facility] rheumatology on 1/2- and then she is getting married on 1/19. She wonders if she might take a steroid pack around the time of her wedding so she can feel good-  I think this is reasonable   She feels like she still has a bit of lower abd pain but much better than when she was in last week to see me. discussed her ovarian and uterine findings.  Discussed her possibly having an ablation per her GYN- she will keep this in mind  Patient Active Problem List   Diagnosis Date Noted  . Low back pain 07/21/2016  . Right  knee pain 06/05/2016  . Left shoulder pain 06/05/2016  . Hydrosalpinx 02/08/2016  . Hemorrhagic ovarian cyst 02/08/2016  . Right hip pain 02/03/2016  . MUSCLE SPASM, BACK 06/08/2009  . HYDROSALPINX 02/07/2008  . HEADACHE 10/28/2007  . TACHYCARDIA 10/14/2007  . ANXIETY DISORDER 05/28/2007  . POLYARTHRITIS 05/09/2007  . UNSPECIFIED DISORDERS OF NERVOUS SYSTEM 04/22/2007  . ABDOMINAL PAIN RIGHT LOWER QUADRANT 04/08/2007  . PALPITATIONS, RECURRENT 11/07/2006  . PROBLEMS W/SMELL/TASTE 06/29/2006  . TOBACCO ABUSE 01/08/2006  . VISUAL IMPAIRMENT 01/08/2006  . DISTURBANCE, VISUAL NOS 01/08/2006  . FIBROCYSTIC BREAST DISEASE 01/08/2006  . [place] DISEASE, CERVICAL 01/08/2006  . VERTIGO 01/08/2006    Past Medical History:  Diagnosis Date  . Anxiety   . Arthritis   . Cervical intraepithelial neoplasia grade 3 2002   s/p LEEP Rx repeat pap negative  . Cervical radiculopathy   . Fallopian tube disorder    right fallopian tube removed  . Gallstones 09/2015   On CT  . Headache    Migraines  . Hemorrhagic ovarian cyst 02/08/2016  . Hydrosalpinx    [facility]  . Hydrosalpinx 02/08/2016  . Interstitial cystitis   . Neurological abnormality    Neg workup with MRI/MRA in 2007 WNL except decreased caliber MCA proximally, distal cavernous portion of left LCA which may represent true stenosis or technique on exam. Evaluated by Dr Linus Salmons.  . Palpitations   . Partial seizures (HCC)    questionable diag  . Pneumonia    walking pneumonia  . Polycystic ovary    multiple ovarian cysts removed  . S/P cervical discectomy    Dr Vertell Limber, anterior discectomy C5-C7  . S/P epidural steroid injection    Right hip  . Seizures ([place]) 2005   simple partial seizure disorder    Past Surgical History:  Procedure Laterality Date  . APPENDECTOMY  1986  . BUNIONECTOMY Left    bunion removal  . CERVICAL DISC SURGERY  2005   C5-C7  . COLONOSCOPY    . DENTAL SURGERY    . fallopian tue  removed    . LAPAROSCOPIC BILATERAL SALPINGECTOMY Left 02/09/2016   Procedure: LAPAROSCOPIC LEFT  SALPINGECTOMY;  Surgeon: Janyth Contes, MD;  Location: [facility];  Service: Gynecology;  Laterality: Left;  . LAPAROSCOPIC LYSIS OF ADHESIONS  02/09/2016   Procedure: LAPAROSCOPIC LYSIS OF ADHESIONS;  Surgeon: Janyth Contes, MD;  Location: [facility];  Service: Gynecology;;  momentum to adenexa  . LAPAROSCOPIC OVARIAN CYSTECTOMY Right 02/09/2016   Procedure: LAPAROSCOPIC OVARIAN CYSTECTOMY;  Surgeon: Janyth Contes, MD;  Location: [facility];  Service: Gynecology;  Laterality: Right;  x2 to right ovary  . LEEP  1998  . OOPHORECTOMY Left 02/09/2016   Procedure: LEFT OOPHORECTOMY;  Surgeon: Janyth Contes, MD;  Location: [facility];  Service: Gynecology;  Laterality: Left;  . TONSILLECTOMY  2001  . UPPER GI ENDOSCOPY      Social History   Tobacco Use  . Smoking status: Current Every Day Smoker    Packs/day: 0.00    Years: 0.00    Pack years:  0.00    Types: Cigarettes    Last attempt to quit: 11/02/2015    Years since quitting: 1.3  . Smokeless tobacco: Never Used  . Tobacco comment: currently smoking, began at beginning of June and plans to quit on June 14th. Has smoked off and on in the past.  Substance Use Topics  . Alcohol use: No    Alcohol/week: 0.6 - 1.2 oz    Types: 1 - 2 Standard drinks or equivalent per week    Comment: occasionally 0-2 per day  . Drug use: No    Family History  Adopted: Yes  Problem Relation Age of Onset  . COPD Mother   . Breast cancer Mother   . Other Mother        Teeth problems- all removed by age 20  . Rashes / Skin problems Sister   . Other Sister        Teeth problems- all removed by age 98  . Rashes / Skin problems Brother   . Asthma Brother   . Other Brother        Teeth problems- all removed by age 37    Allergies  Allergen Reactions  . Clindamycin/Lincomycin Itching and Other (See Comments)    Dizziness, trouble swallowing  .  Codeine Itching and Nausea And Vomiting  . Cyclobenzaprine Other (See Comments)    Fast heartbeat, restless leg, nightmares  . Gabapentin Other (See Comments)    Headaches, visual disturbances.   . Nyquil Multi-Symptom [Pseudoeph-Doxylamine-Dm-Apap] Other (See Comments)    Heart racing, dizziness, weakness  . Uribel [Meth-Hyo-M Bl-Na Phos-Ph Sal] Other (See Comments)    Heart racing, dizziness, blurry vision.  . Xanax [Alprazolam]     Bruising    Medication list has been reviewed and updated.  Current Outpatient Medications on File Prior to Visit  Medication Sig Dispense Refill  . diazepam (VALIUM) 2 MG tablet Take 2 mg by mouth every 12 (twelve) hours as needed.     . famotidine (PEPCID) 20 MG tablet Take 1 tablet (20 mg total) by mouth daily. 90 tablet 0  . loratadine (CLARITIN) 10 MG tablet Take 10 mg by mouth daily.    . orphenadrine (NORFLEX) 100 MG tablet Take 1 tablet (100 mg total) by mouth 2 (two) times daily. 30 tablet 0   No current facility-administered medications on file prior to visit.     Review of Systems:  As per HPI- otherwise negative.   Physical Examination: Vitals:   03/07/17 1138  BP: 102/82  Pulse: 94  Temp: 98.7 F (37.1 C)  SpO2: 98%   Vitals:   03/07/17 1138  Weight: 179 lb (81.2 kg)  Height: 5\' 5"  (1.651 m)   Body mass index is 29.79 kg/m. Ideal Body Weight: Weight in (lb) to have BMI = 25: 149.9  GEN: WDWN, NAD, Non-toxic, A & O x 3, overweight, looks well HEENT: Atraumatic, Normocephalic. Neck supple. No masses, No LAD. Ears and Nose: No external deformity. CV: RRR, No M/G/R. No JVD. No thrill. No extra heart sounds. PULM: CTA B, no wheezes, crackles, rhonchi. No retractions. No resp. distress. No accessory muscle use. ABD: S, NT, ND, +BS. No rebound. No HSM.  Belly is benign today EXTR: No c/c/e NEURO Normal gait.  PSYCH: Normally interactive. Conversant. Not depressed or anxious appearing.  Calm demeanor.    Assessment and  Plan: E. coli UTI  Body aches - Plan: methylPREDNISolone (MEDROL DOSEPAK) 4 MG TBPK tablet  Positive ANA (antinuclear antibody) -  Plan: methylPREDNISolone (MEDROL DOSEPAK) 4 MG TBPK tablet  Leukocytosis, unspecified type - Plan: CANCELED: CBC with Differential/Platelet  Here today to follow-up from recent ER visit She was dx with an e coli uti and treated with keflex However she was also given a medrol dosepack and was amazed at how great she felt while taking this.  She wonders if this means she does indeed have a rheumatological disorder.  Certainly this is interesting, and she is seeing rheumatology in about one month.  I will print out her old rheum notes and labs and fax for her    Signed Lamar Blinks, MD  Received her labs Results for orders placed or performed in visit on 03/07/17  CBC with Differential/Platelet  Result Value Ref Range   WBC 14.2 (H) 4.0 - 10.5 K/uL   RBC 5.33 (H) 3.87 - 5.11 Mil/uL   Hemoglobin 15.2 (H) 12.0 - 15.0 g/dL   HCT 46.0 36.0 - 46.0 %   MCV 86.3 78.0 - 100.0 fl   MCHC 33.1 30.0 - 36.0 g/dL   RDW 14.4 11.5 - 15.5 %   Platelets 354.0 150.0 - 400.0 K/uL   Neutrophils Relative % 66.1 43.0 - 77.0 %   Lymphocytes Relative 23.4 12.0 - 46.0 %   Monocytes Relative 5.4 3.0 - 12.0 %   Eosinophils Relative 3.2 0.0 - 5.0 %   Basophils Relative 1.9 0.0 - 3.0 %   Neutro Abs 9.4 (H) 1.4 - 7.7 K/uL   Lymphs Abs 3.3 0.7 - 4.0 K/uL   Monocytes Absolute 0.8 0.1 - 1.0 K/uL   Eosinophils Absolute 0.5 0.0 - 0.7 K/uL   Basophils Absolute 0.3 (H) 0.0 - 0.1 K/uL   White blood cell count is still up Will go ahead with referral to hematology

## 2017-03-07 ENCOUNTER — Ambulatory Visit (INDEPENDENT_AMBULATORY_CARE_PROVIDER_SITE_OTHER): Payer: BLUE CROSS/BLUE SHIELD | Admitting: Family Medicine

## 2017-03-07 ENCOUNTER — Encounter: Payer: Self-pay | Admitting: Family Medicine

## 2017-03-07 VITALS — BP 102/82 | HR 94 | Temp 98.7°F | Ht 65.0 in | Wt 179.0 lb

## 2017-03-07 DIAGNOSIS — D72829 Elevated white blood cell count, unspecified: Secondary | ICD-10-CM

## 2017-03-07 DIAGNOSIS — B9620 Unspecified Escherichia coli [E. coli] as the cause of diseases classified elsewhere: Secondary | ICD-10-CM

## 2017-03-07 DIAGNOSIS — R768 Other specified abnormal immunological findings in serum: Secondary | ICD-10-CM

## 2017-03-07 DIAGNOSIS — N390 Urinary tract infection, site not specified: Secondary | ICD-10-CM | POA: Diagnosis not present

## 2017-03-07 DIAGNOSIS — R52 Pain, unspecified: Secondary | ICD-10-CM | POA: Diagnosis not present

## 2017-03-07 LAB — CBC WITH DIFFERENTIAL/PLATELET
Basophils Absolute: 0.3 10*3/uL — ABNORMAL HIGH (ref 0.0–0.1)
Basophils Relative: 1.9 % (ref 0.0–3.0)
Eosinophils Absolute: 0.5 10*3/uL (ref 0.0–0.7)
Eosinophils Relative: 3.2 % (ref 0.0–5.0)
HCT: 46 % (ref 36.0–46.0)
Hemoglobin: 15.2 g/dL — ABNORMAL HIGH (ref 12.0–15.0)
Lymphocytes Relative: 23.4 % (ref 12.0–46.0)
Lymphs Abs: 3.3 10*3/uL (ref 0.7–4.0)
MCHC: 33.1 g/dL (ref 30.0–36.0)
MCV: 86.3 fl (ref 78.0–100.0)
Monocytes Absolute: 0.8 10*3/uL (ref 0.1–1.0)
Monocytes Relative: 5.4 % (ref 3.0–12.0)
Neutro Abs: 9.4 10*3/uL — ABNORMAL HIGH (ref 1.4–7.7)
Neutrophils Relative %: 66.1 % (ref 43.0–77.0)
Platelets: 354 10*3/uL (ref 150.0–400.0)
RBC: 5.33 Mil/uL — ABNORMAL HIGH (ref 3.87–5.11)
RDW: 14.4 % (ref 11.5–15.5)
WBC: 14.2 10*3/uL — ABNORMAL HIGH (ref 4.0–10.5)

## 2017-03-07 MED ORDER — METHYLPREDNISOLONE 4 MG PO TBPK: ORAL_TABLET | ORAL | 0 refills | Status: DC

## 2017-03-07 NOTE — Addendum Note (Signed)
Addended by: Harl Bowie on: 03/07/2017 03:42 PM   Modules accepted: Orders

## 2017-03-07 NOTE — Patient Instructions (Signed)
It was good to see you today- it is certainly interesting how much the prednisone helped you. Please do be sure to mention this at your upcoming rheumatology appt!  I will print out and fax all pertinent labs that I can find for you as well

## 2017-03-09 ENCOUNTER — Telehealth: Payer: Self-pay

## 2017-03-09 ENCOUNTER — Encounter: Payer: Self-pay | Admitting: Family Medicine

## 2017-03-09 NOTE — Addendum Note (Signed)
Addended by: Lamar Blinks C on: 03/09/2017 12:58 PM   Modules accepted: Orders

## 2017-03-09 NOTE — Telephone Encounter (Signed)
Copied from [place] [id number]. Topic: Inquiry >> Mar 09, 2017 11:27 AM Corie Chiquito, NT wrote: Reason for [id number] called about the lab work she had done in the office. Would like to know when the results will post to her my chart account. If someone could please give her a call back at [phone]

## 2017-03-13 ENCOUNTER — Encounter: Payer: Self-pay | Admitting: Family Medicine

## 2017-03-23 ENCOUNTER — Other Ambulatory Visit: Payer: Self-pay | Admitting: Family

## 2017-03-23 DIAGNOSIS — D72829 Elevated white blood cell count, unspecified: Secondary | ICD-10-CM

## 2017-03-26 ENCOUNTER — Other Ambulatory Visit (HOSPITAL_BASED_OUTPATIENT_CLINIC_OR_DEPARTMENT_OTHER): Payer: BLUE CROSS/BLUE SHIELD

## 2017-03-26 ENCOUNTER — Other Ambulatory Visit: Payer: Self-pay

## 2017-03-26 ENCOUNTER — Encounter: Payer: Self-pay | Admitting: Family

## 2017-03-26 ENCOUNTER — Ambulatory Visit (HOSPITAL_BASED_OUTPATIENT_CLINIC_OR_DEPARTMENT_OTHER): Payer: BLUE CROSS/BLUE SHIELD | Admitting: Family

## 2017-03-26 VITALS — BP 115/72 | HR 87 | Temp 98.4°F | Resp 16 | Wt 178.0 lb

## 2017-03-26 DIAGNOSIS — M358 Other specified systemic involvement of connective tissue: Secondary | ICD-10-CM | POA: Diagnosis not present

## 2017-03-26 DIAGNOSIS — D500 Iron deficiency anemia secondary to blood loss (chronic): Secondary | ICD-10-CM

## 2017-03-26 DIAGNOSIS — D72829 Elevated white blood cell count, unspecified: Secondary | ICD-10-CM

## 2017-03-26 DIAGNOSIS — D72828 Other elevated white blood cell count: Secondary | ICD-10-CM

## 2017-03-26 LAB — CMP (CANCER CENTER ONLY)
ALT(SGPT): 47 U/L (ref 10–47)
AST: 32 U/L (ref 11–38)
Albumin: 3.7 g/dL (ref 3.3–5.5)
Alkaline Phosphatase: 64 U/L (ref 26–84)
BUN, Bld: 7 mg/dL (ref 7–22)
CO2: 29 mEq/L (ref 18–33)
Calcium: 9.5 mg/dL (ref 8.0–10.3)
Chloride: 103 mEq/L (ref 98–108)
Creat: 0.8 mg/dl (ref 0.6–1.2)
Glucose, Bld: 111 mg/dL (ref 73–118)
Potassium: 4.4 mEq/L (ref 3.3–4.7)
Sodium: 144 mEq/L (ref 128–145)
Total Bilirubin: 0.6 mg/dl (ref 0.20–1.60)
Total Protein: 6.9 g/dL (ref 6.4–8.1)

## 2017-03-26 LAB — CBC WITH DIFFERENTIAL (CANCER CENTER ONLY)
BASO#: 0.2 10*3/uL (ref 0.0–0.2)
BASO%: 1.4 % (ref 0.0–2.0)
EOS%: 3.7 % (ref 0.0–7.0)
Eosinophils Absolute: 0.4 10*3/uL (ref 0.0–0.5)
HCT: 44 % (ref 34.8–46.6)
HGB: 15.1 g/dL (ref 11.6–15.9)
LYMPH#: 2.8 10*3/uL (ref 0.9–3.3)
LYMPH%: 23.9 % (ref 14.0–48.0)
MCH: 28.9 pg (ref 26.0–34.0)
MCHC: 34.3 g/dL (ref 32.0–36.0)
MCV: 84 fL (ref 81–101)
MONO#: 0.5 10*3/uL (ref 0.1–0.9)
MONO%: 4.7 % (ref 0.0–13.0)
NEUT#: 7.6 10*3/uL — ABNORMAL HIGH (ref 1.5–6.5)
NEUT%: 66.3 % (ref 39.6–80.0)
Platelets: 330 10*3/uL (ref 145–400)
RBC: 5.23 10*6/uL (ref 3.70–5.32)
RDW: 14 % (ref 11.1–15.7)
WBC: 11.5 10*3/uL — ABNORMAL HIGH (ref 3.9–10.0)

## 2017-03-26 LAB — CHCC SATELLITE - SMEAR

## 2017-03-26 LAB — LACTATE DEHYDROGENASE: LDH: 199 U/L (ref 125–245)

## 2017-03-26 NOTE — Progress Notes (Signed)
Hematology/Oncology Consultation   Name: Victoria Hall      MRN: [id number]    Location: Room/bed info not found  Date: 03/26/2017 Time:11:37 AM   REFERRING PHYSICIAN: Silvestre Mesi, MD  REASON FOR CONSULT: Leukocytosis    DIAGNOSIS:  Reactive leukocytosis   HISTORY OF PRESENT ILLNESS: Victoria Hall is a very pleasant 45 yo caucasian female with a several year history of mild leukocytosis. WBC count today is 11.5, no anemia and platelet count is 330.  She has a great deal of fatigue along with joint pain and swelling. She has had to use a cane due to the severity of her hip pain.  She has seen orthopedist Dr. Judy Pimple. She has had an elevated ANA.  She has rashes that come up on her face and chest. She has an appointment with Rheumatology for further work up in January.  She was diagnosed with polymyalgia rheumatica arthritis in her late teens/early 20's.  She states that she is adopted and does not have any family history.  She has never been pregnant. She had severe PCOS and has had both fallopian tubes removed. Her cycle is quite heavy and irregular.  She states that she has been running a low grade fever, 99.1-99.8, for weeks.  Recent CT scan of the abdomen (02/26/17) was negative.   She has had some dizziness, blurry vision and headaches.  She has chronic sinusitis that waxes and wanes. No other issue with infections.  No chills, n/v, cough, SOB, chest pain or changes in bowel or bladder habits.  She has history of IBS with some abdominal cramping.  She has occasional bruising but not in excess. No bleeding or petechiae.  She has history of PVCs and occasional palpitations.  She has had some puffiness in her feet that comes and goes. This improves with elevating her feet.  Her appetite comes and goes. She hydrates well. She states that her weight is stable.  She does smoke 1/2 ppd. No ETOH.  She works as an Ambulance person. She has not been able to have a  regular job due to her symptoms. She states that there are days she just can't get off of the couch.   ROS: All other 10 point review of systems is negative.   PAST MEDICAL HISTORY:   Past Medical History:  Diagnosis Date  . Anxiety   . Arthritis   . Cervical intraepithelial neoplasia grade 3 2002   s/p LEEP Rx repeat pap negative  . Cervical radiculopathy   . Fallopian tube disorder    right fallopian tube removed  . Gallstones 09/2015   On CT  . Headache    Migraines  . Hemorrhagic ovarian cyst 02/08/2016  . Hydrosalpinx    [facility]  . Hydrosalpinx 02/08/2016  . Interstitial cystitis   . Neurological abnormality    Neg workup with MRI/MRA in 2007 WNL except decreased caliber MCA proximally, distal cavernous portion of left LCA which may represent true stenosis or technique on exam. Evaluated by Dr Linus Salmons.  . Palpitations   . Partial seizures (HCC)    questionable diag  . Pneumonia    walking pneumonia  . Polycystic ovary    multiple ovarian cysts removed  . S/P cervical discectomy    Dr Vertell Limber, anterior discectomy C5-C7  . S/P epidural steroid injection    Right hip  . Seizures ([place]) 2005   simple partial seizure disorder    ALLERGIES: Allergies  Allergen Reactions  .  Clindamycin/Lincomycin Itching and Other (See Comments)    Dizziness, trouble swallowing  . Codeine Itching and Nausea And Vomiting  . Cyclobenzaprine Other (See Comments)    Fast heartbeat, restless leg, nightmares  . Gabapentin Other (See Comments)    Headaches, visual disturbances.   . Nyquil Multi-Symptom [Pseudoeph-Doxylamine-Dm-Apap] Other (See Comments)    Heart racing, dizziness, weakness  . Uribel [Meth-Hyo-M Bl-Na Phos-Ph Sal] Other (See Comments)    Heart racing, dizziness, blurry vision.  . Xanax [Alprazolam]     Bruising      MEDICATIONS:  Current Outpatient Medications on File Prior to Visit  Medication Sig Dispense Refill  . diazepam (VALIUM) 2 MG tablet  Take 2 mg by mouth every 12 (twelve) hours as needed.     . famotidine (PEPCID) 20 MG tablet Take 1 tablet (20 mg total) by mouth daily. 90 tablet 0  . loratadine (CLARITIN) 10 MG tablet Take 10 mg by mouth daily.    . methylPREDNISolone (MEDROL DOSEPAK) 4 MG TBPK tablet Per dose pack instructions 21 tablet 0  . orphenadrine (NORFLEX) 100 MG tablet Take 1 tablet (100 mg total) by mouth 2 (two) times daily. 30 tablet 0   No current facility-administered medications on file prior to visit.      PAST SURGICAL HISTORY Past Surgical History:  Procedure Laterality Date  . APPENDECTOMY  1986  . BUNIONECTOMY Left    bunion removal  . CERVICAL DISC SURGERY  2005   C5-C7  . COLONOSCOPY    . DENTAL SURGERY    . fallopian tue removed    . LAPAROSCOPIC BILATERAL SALPINGECTOMY Left 02/09/2016   Procedure: LAPAROSCOPIC LEFT  SALPINGECTOMY;  Surgeon: Janyth Contes, MD;  Location: [facility];  Service: Gynecology;  Laterality: Left;  . LAPAROSCOPIC LYSIS OF ADHESIONS  02/09/2016   Procedure: LAPAROSCOPIC LYSIS OF ADHESIONS;  Surgeon: Janyth Contes, MD;  Location: [facility];  Service: Gynecology;;  momentum to adenexa  . LAPAROSCOPIC OVARIAN CYSTECTOMY Right 02/09/2016   Procedure: LAPAROSCOPIC OVARIAN CYSTECTOMY;  Surgeon: Janyth Contes, MD;  Location: [facility];  Service: Gynecology;  Laterality: Right;  x2 to right ovary  . LEEP  1998  . OOPHORECTOMY Left 02/09/2016   Procedure: LEFT OOPHORECTOMY;  Surgeon: Janyth Contes, MD;  Location: [facility];  Service: Gynecology;  Laterality: Left;  . TONSILLECTOMY  2001  . UPPER GI ENDOSCOPY      FAMILY HISTORY: Family History  Adopted: Yes  Problem Relation Age of Onset  . COPD Mother   . Breast cancer Mother   . Other Mother        Teeth problems- all removed by age 61  . Rashes / Skin problems Sister   . Other Sister        Teeth problems- all removed by age 22  . Rashes / Skin problems Brother   . Asthma Brother   . Other Brother         Teeth problems- all removed by age 47    SOCIAL HISTORY:  reports that she has been smoking cigarettes.  She has been smoking about 0.00 packs per day for the past 0.00 years. she has never used smokeless tobacco. She reports that she does not drink alcohol or use drugs.  PERFORMANCE STATUS: The patient's performance status is 1 - Symptomatic but completely ambulatory  PHYSICAL EXAM: Most Recent Vital Signs: Blood pressure 115/72, pulse 87, temperature 98.4 F (36.9 C), temperature source Oral, resp. rate 16, weight 178 lb (80.7 kg), SpO2 99 %. BP  115/72 (BP Location: Left Arm, Patient Position: Sitting)   Pulse 87   Temp 98.4 F (36.9 C) (Oral)   Resp 16   Wt 178 lb (80.7 kg)   SpO2 99%   BMI 29.62 kg/m   General Appearance:    Alert, cooperative, no distress, appears stated age  Head:    Normocephalic, without obvious abnormality, atraumatic  Eyes:    PERRL, conjunctiva/corneas clear, EOM's intact, fundi    benign, both eyes        Throat:   Lips, mucosa, and tongue normal; teeth and gums normal  Neck:   Supple, symmetrical, trachea midline, no adenopathy;    thyroid:  no enlargement/tenderness/nodules; no carotid   bruit or JVD  Back:     Symmetric, no curvature, ROM normal, no CVA tenderness  Lungs:     Clear to auscultation bilaterally, respirations unlabored  Chest Wall:    No tenderness or deformity   Heart:    Regular rate and rhythm, S1 and S2 normal, no murmur, rub   or gallop     Abdomen:     Soft, non-tender, bowel sounds active all four quadrants,    no masses, no organomegaly        Extremities:   Extremities normal, atraumatic, no cyanosis or edema  Pulses:   2+ and symmetric all extremities  Skin:   Skin color, texture, turgor normal, no rashes or lesions  Lymph nodes:   Cervical, supraclavicular, and axillary nodes normal  Neurologic:   CNII-XII intact, normal strength, sensation and reflexes    throughout    LABORATORY DATA:  Results for  orders placed or performed in visit on 03/26/17 (from the past 48 hour(s))  CBC w/Diff     Status: Abnormal   Collection Time: 03/26/17 11:01 AM  Result Value Ref Range   WBC 11.5 (H) 3.9 - 10.0 10e3/uL   RBC 5.23 3.70 - 5.32 10e6/uL   HGB 15.1 11.6 - 15.9 g/dL   HCT 44.0 34.8 - 46.6 %   MCV 84 81 - 101 fL   MCH 28.9 26.0 - 34.0 pg   MCHC 34.3 32.0 - 36.0 g/dL   RDW 14.0 11.1 - 15.7 %   Platelets 330 145 - 400 10e3/uL   NEUT# 7.6 (H) 1.5 - 6.5 10e3/uL   LYMPH# 2.8 0.9 - 3.3 10e3/uL   MONO# 0.5 0.1 - 0.9 10e3/uL   Eosinophils Absolute 0.4 0.0 - 0.5 10e3/uL   BASO# 0.2 0.0 - 0.2 10e3/uL   NEUT% 66.3 39.6 - 80.0 %   LYMPH% 23.9 14.0 - 48.0 %   MONO% 4.7 0.0 - 13.0 %   EOS% 3.7 0.0 - 7.0 %   BASO% 1.4 0.0 - 2.0 %  Smear     Status: None   Collection Time: 03/26/17 11:01 AM  Result Value Ref Range   Smear Result Smear Available       RADIOGRAPHY: No results found.     PATHOLOGY: None  ASSESSMENT/PLAN: Victoria Hall is a very pleasant 46 yo caucasian female with history of mild reactive leukocytosis. She appears to have a collagen vascular disease and will be seeing Rheumatology in a few weeks for further work up.  Dr. Marin Olp was able to view her blood smear and no abnormality or evidence of malignancy was noted.  Her abdominal CT scan on November 26th was negative.  We will continue to follow along with her and plan to see her back again in another 4 months for follow-up.  Hopefully by that time she will have some idea from her rheumatology work-up as to what is causing her symptoms.   All questions were answered and she is in agreement with the plan. She will contact our office with any other questions or concerns. We can certainly see her sooner if necessary.  She was discussed with and also seen by Dr. Marin Olp and he is in agreement with the aforementioned.   Laverna Peace     Addendum: I saw and examined the patient with Judson Roch.  I agree with the above assessment.  I  looked at her blood under the microscope.  Her blood smear showed normal-appearing myeloid cells.  There were no hypersegmented polys.  She has some reactive neutrophils.  There were no immature myeloid or lymphoid cells.  Red cells did not show any nucleated red blood cells.  There were no inclusion bodies.  There is no rouleaux formation.  Platelets were adequate in number and size.  I think it is apparent that Victoria Hall has a collagen vascular disorder.  I think everything fits into a collagen vascular category.  She has the elevated ANA.  She will see rheumatology soon.  I am sure they will run there myriad of antibody studies.  I do not see that she needs a bone marrow biopsy.  She had a CT scan done a month ago.  There is no splenomegaly.  There is no obvious adenopathy.  I just do not see a primary hematologic disorder with Victoria Hall.  I do feel bad for her.  I just hate that she feels miserable.  We will plan to get her back in about 4 months.  I think this would be reasonable.  We spent about 40 minutes with she and her fianc.  They will be married in January.  Lattie Haw, MD

## 2017-04-04 DIAGNOSIS — M2550 Pain in unspecified joint: Secondary | ICD-10-CM | POA: Diagnosis not present

## 2017-04-04 DIAGNOSIS — R5382 Chronic fatigue, unspecified: Secondary | ICD-10-CM | POA: Diagnosis not present

## 2017-04-05 ENCOUNTER — Encounter: Payer: Self-pay | Admitting: Family Medicine

## 2017-04-09 ENCOUNTER — Telehealth: Payer: Self-pay | Admitting: Family Medicine

## 2017-04-09 NOTE — Telephone Encounter (Signed)
Patient called in with c/o "lower back pain." She said "I called in to see if it is ok if I take 1/2 pill of Hydrocodone I have left from dental work done for my lower back pain." I advised patient I could not give her that advice, but I could triage her to offer other alternatives based on her back pain answers to questions. She said "Is there any way I can ask Dr. Lorelei Pont, since she is familiar with me and the lower back pain?" I advised that the doctor would probably not give her the ok to take it either, but she could send a message to her in [place] to ask. She said "in the past when I send a message, it's not answered until the next day, so I will just call back tomorrow at 0630 to get an appointment once I secure a ride."

## 2017-04-09 NOTE — Telephone Encounter (Signed)
Patient called back at [phone] to discuss her low back pain, left VM to call back to the office.

## 2017-04-09 NOTE — Telephone Encounter (Signed)
Copied from [place] [id number]. Topic: Inquiry >> Apr 09, 2017  4:49 PM Ahmed Prima L wrote: Pt wants to know what she could do for lower back pain. Tried to get a [facility] nurse, line busy. She wants to know what she can take with the two other meds she is taking, she has hydrocodone from about a year ago from a dental procedure. Please call back @ [phone]

## 2017-04-26 DIAGNOSIS — Z881 Allergy status to other antibiotic agents status: Secondary | ICD-10-CM | POA: Diagnosis not present

## 2017-04-26 DIAGNOSIS — Z885 Allergy status to narcotic agent status: Secondary | ICD-10-CM | POA: Diagnosis not present

## 2017-04-26 DIAGNOSIS — M47815 Spondylosis without myelopathy or radiculopathy, thoracolumbar region: Secondary | ICD-10-CM | POA: Diagnosis not present

## 2017-04-26 DIAGNOSIS — M545 Low back pain: Secondary | ICD-10-CM | POA: Diagnosis not present

## 2017-04-26 DIAGNOSIS — Z888 Allergy status to other drugs, medicaments and biological substances status: Secondary | ICD-10-CM | POA: Diagnosis not present

## 2017-04-26 DIAGNOSIS — R5382 Chronic fatigue, unspecified: Secondary | ICD-10-CM | POA: Diagnosis not present

## 2017-04-26 DIAGNOSIS — M47816 Spondylosis without myelopathy or radiculopathy, lumbar region: Secondary | ICD-10-CM | POA: Diagnosis not present

## 2017-05-28 ENCOUNTER — Other Ambulatory Visit: Payer: Self-pay

## 2017-05-28 NOTE — Telephone Encounter (Signed)
Receive fax for 90 day supply for Famotidine and Montelukast. Patient was last seen 10/10/2016. Patient needs office visit.

## 2017-05-30 ENCOUNTER — Other Ambulatory Visit: Payer: Self-pay | Admitting: Allergy and Immunology

## 2017-06-29 ENCOUNTER — Encounter: Payer: Self-pay | Admitting: Family Medicine

## 2017-07-02 NOTE — Progress Notes (Signed)
[name] [address], [place], [place] [address] [phone]  Date:  07/04/2017   Name:  Victoria Hall   DOB:  1970/07/01   MRN:  [id number]  PCP:  Darreld Mclean, MD    Chief Complaint: BP concerns (Pt here to discuss BP concerns. )   History of Present Illness:  Victoria Hall is a 47 y.o. very pleasant female patient who presents with the following:  Here today for a recheck- she had emailed me with concern of POTS syndrome - she has noted that her pulse will go up when she stands, but her BP does not drop  She also has concern of several other symptoms - as per her recent email:  I would like to schedule a visit to be assessed for POTS, please. I learned that POTS is often a co-morbidity with CFS and fibromyalgia (which the rheumatologist suggested are my issues), and did the "poor-man's tilt table test": lie down for 10 min and take resting heart rate, then stand and be still. My pulse went up by at least 30bpm within 2 minutes every single time. POTS would explain fainting episodes I experienced all through my younger years, and best of all, it's treatable.  Thank you for the suggestions of massage and acupuncture, but I'm afraid until I'm well enough to work, the expense is out of the question. I've now been unable to work for more than three years. My husband thinks I've not been clear about the level of disability I've been living with, often unable to leave the house for weeks at a time.  Pt reports that her rheumatologist has questioned if she may have chronic fatigue/ FBM, and POTS syndrome. She notes that she was wearing a fit bit, and noticed that her resting heart rate was 70- 75.  However after she would stand up suddenly, her pulse may be up to about 135 which seemed strange to her.  She stopped using the fit bit but still likes to check her pulse using an app on her phone and has continued to see  this pattern  She has read a lot about how to manage POTS. She has tried "pacing" which means that she tries to conserve her energy,  and this lets her get more done during the day. She will time her activities and take frequent breaks.  She was able to mow the lawn last week using this method.   She has not worked in several years due to her sx but hopes that she may be able to get back to work one day Her husband is supportive She is trying to increase her fluid and lyte intake, and is drinking water, coconut water.    She is also using CBD, she is using this as a vape, as she did not want to smoke and risk her lungs.  This has helped a lot with her pain. She is using it every hour or so, and overall feels a lot better.  She does continue to feel tired a lot of the time, this is her main symptom at this time  She did recently see a spinal specialist, Dr. Georganna Skeans.  She has spondylosis, surgical options are not suggested for her. They talked about pain control, but CBD has helped her so much that she did not have to pursue other options  She does not really feel like she is having any anxiety.  She does still feel dizzy when she first stands up, this has not resolved  She reports that when she has been told that her EKG was abnormal in the past  We did send her for a holter a year ago, which was ok:   11 PVC's, for PACs. No pauses. No atrial fibrillation.  No adverse arrhythmias detected.   Reassuring monitor. Candee Furbish, MD  She would like to see cardiology for a possible tilt table test and ambulatory BP monitor, as well as to discuss her EKG findings.  This is fine, will make referral for her toay  Patient Active Problem List   Diagnosis Date Noted  . Low back pain 07/21/2016  . Right knee pain 06/05/2016  . Left shoulder pain 06/05/2016  . Hydrosalpinx 02/08/2016  . Hemorrhagic ovarian cyst 02/08/2016  . Right hip pain 02/03/2016  . MUSCLE SPASM, BACK 06/08/2009  .  HYDROSALPINX 02/07/2008  . HEADACHE 10/28/2007  . TACHYCARDIA 10/14/2007  . ANXIETY DISORDER 05/28/2007  . POLYARTHRITIS 05/09/2007  . UNSPECIFIED DISORDERS OF NERVOUS SYSTEM 04/22/2007  . ABDOMINAL PAIN RIGHT LOWER QUADRANT 04/08/2007  . PALPITATIONS, RECURRENT 11/07/2006  . PROBLEMS W/SMELL/TASTE 06/29/2006  . TOBACCO ABUSE 01/08/2006  . VISUAL IMPAIRMENT 01/08/2006  . DISTURBANCE, VISUAL NOS 01/08/2006  . FIBROCYSTIC BREAST DISEASE 01/08/2006  . [place] DISEASE, CERVICAL 01/08/2006  . VERTIGO 01/08/2006    Past Medical History:  Diagnosis Date  . Anxiety   . Arthritis   . Cervical intraepithelial neoplasia grade 3 2002   s/p LEEP Rx repeat pap negative  . Cervical radiculopathy   . Fallopian tube disorder    right fallopian tube removed  . Gallstones 09/2015   On CT  . Headache    Migraines  . Hemorrhagic ovarian cyst 02/08/2016  . Hydrosalpinx    [facility]  . Hydrosalpinx 02/08/2016  . Interstitial cystitis   . Neurological abnormality    Neg workup with MRI/MRA in 2007 WNL except decreased caliber MCA proximally, distal cavernous portion of left LCA which may represent true stenosis or technique on exam. Evaluated by Dr Linus Salmons.  . Palpitations   . Partial seizures (HCC)    questionable diag  . Pneumonia    walking pneumonia  . Polycystic ovary    multiple ovarian cysts removed  . S/P cervical discectomy    Dr Vertell Limber, anterior discectomy C5-C7  . S/P epidural steroid injection    Right hip  . Seizures ([facility]) 2005   simple partial seizure disorder    Past Surgical History:  Procedure Laterality Date  . APPENDECTOMY  1986  . BUNIONECTOMY Left    bunion removal  . CERVICAL DISC SURGERY  2005   C5-C7  . COLONOSCOPY    . DENTAL SURGERY    . fallopian tue removed    . LAPAROSCOPIC BILATERAL SALPINGECTOMY Left 02/09/2016   Procedure: LAPAROSCOPIC LEFT  SALPINGECTOMY;  Surgeon: Janyth Contes, MD;  Location: [facility];  Service: Gynecology;   Laterality: Left;  . LAPAROSCOPIC LYSIS OF ADHESIONS  02/09/2016   Procedure: LAPAROSCOPIC LYSIS OF ADHESIONS;  Surgeon: Janyth Contes, MD;  Location: [facility];  Service: Gynecology;;  momentum to adenexa  . LAPAROSCOPIC OVARIAN CYSTECTOMY Right 02/09/2016   Procedure: LAPAROSCOPIC OVARIAN CYSTECTOMY;  Surgeon: Janyth Contes, MD;  Location: [facility];  Service: Gynecology;  Laterality: Right;  x2 to right ovary  . LEEP  1998  . OOPHORECTOMY Left 02/09/2016   Procedure: LEFT OOPHORECTOMY;  Surgeon: Janyth Contes, MD;  Location: [facility];  Service: Gynecology;  Laterality: Left;  . TONSILLECTOMY  2001  . UPPER GI ENDOSCOPY      Social History   Tobacco Use  . Smoking status: Current Every Day Smoker    Packs/day: 0.00    Years: 0.00    Pack years: 0.00    Types: Cigarettes    Last attempt to quit: 11/02/2015    Years since quitting: 1.6  . Smokeless tobacco: Never Used  . Tobacco comment: currently smoking, began at beginning of June and plans to quit on June 14th. Has smoked off and on in the past.  Substance Use Topics  . Alcohol use: No    Alcohol/week: 0.6 - 1.2 oz    Types: 1 - 2 Standard drinks or equivalent per week    Comment: occasionally 0-2 per day  . Drug use: No    Family History  Adopted: Yes  Problem Relation Age of Onset  . COPD Mother   . Breast cancer Mother   . Other Mother        Teeth problems- all removed by age 8  . Rashes / Skin problems Sister   . Other Sister        Teeth problems- all removed by age 62  . Rashes / Skin problems Brother   . Asthma Brother   . Other Brother        Teeth problems- all removed by age 40    Allergies  Allergen Reactions  . Clindamycin/Lincomycin Itching and Other (See Comments)    Dizziness, trouble swallowing  . Codeine Itching and Nausea And Vomiting  . Cyclobenzaprine Other (See Comments)    Fast heartbeat, restless leg, nightmares  . Gabapentin Other (See Comments)    Headaches, visual  disturbances.   . Nyquil Multi-Symptom [Pseudoeph-Doxylamine-Dm-Apap] Other (See Comments)    Heart racing, dizziness, weakness  . Uribel [Meth-Hyo-M Bl-Na Phos-Ph Sal] Other (See Comments)    Heart racing, dizziness, blurry vision.  . Xanax [Alprazolam]     Bruising    Medication list has been reviewed and updated.  Current Outpatient Medications on File Prior to Visit  Medication Sig Dispense Refill  . diazepam (VALIUM) 2 MG tablet Take 2 mg by mouth every 12 (twelve) hours as needed.     . famotidine (PEPCID) 20 MG tablet Take 1 tablet (20 mg total) by mouth daily. (Patient taking differently: Take 20 mg by mouth daily as needed. ) 90 tablet 0  . loratadine (CLARITIN) 10 MG tablet Take 10 mg by mouth daily as needed.      No current facility-administered medications on file prior to visit.     Review of Systems:  As per HPI- otherwise negative. Pain is reduced with CBD Continues to feel more tired that she would think is normal but this is also improved   Physical Examination: Vitals:   07/04/17 0849  BP: 122/80  Pulse: 93  Temp: 99 F (37.2 C)  SpO2: 97%   Vitals:   07/04/17 0849  Weight: 176 lb 6.4 oz (80 kg)   Body mass index is 29.35 kg/m. Ideal Body Weight:    GEN: WDWN, NAD, Non-toxic, A & O x 3, overweight, looks well today.  Seems to be in a good mood and feeling well HEENT: Atraumatic, Normocephalic. Neck supple. No masses, No LAD. Ears and Nose: No external deformity. CV: RRR, No M/G/R. No JVD. No thrill. No extra heart sounds. PULM: CTA B, no wheezes, crackles, rhonchi. No retractions. No resp. distress. No  accessory muscle use. ABD: S, NT, ND EXTR: No c/c/e NEURO Normal gait.  PSYCH: Normally interactive. Conversant. Not depressed or anxious appearing.  Calm demeanor.   Laying pulse 75-80.  After 10 minutes pt stood, pulse to approx 120 Assessment and Plan: Postural orthostatic tachycardia syndrome - Plan: Ambulatory referral to  Cardiology  Abnormal EKG - Plan: Ambulatory referral to Cardiology  Chronic fatigue  Here today for a recheck visit - see discussion above.  Marymargaret has a complex history of fatigue and body pains.  Whatever the root cause, she has gotten a lot of relief through CBD which is great.  She thinks that she has POTS syndrome and has been self- treating through hydration and rest; this is helping. She is interested in getting a cardiology opinion which is a reasonable next step.  Will make referral for her today   Signed Lamar Blinks, MD

## 2017-07-04 ENCOUNTER — Ambulatory Visit: Payer: BLUE CROSS/BLUE SHIELD | Admitting: Family Medicine

## 2017-07-04 ENCOUNTER — Encounter: Payer: Self-pay | Admitting: Family Medicine

## 2017-07-04 VITALS — BP 122/80 | HR 93 | Temp 99.0°F | Wt 176.4 lb

## 2017-07-04 DIAGNOSIS — R000 Tachycardia, unspecified: Secondary | ICD-10-CM | POA: Diagnosis not present

## 2017-07-04 DIAGNOSIS — R5382 Chronic fatigue, unspecified: Secondary | ICD-10-CM

## 2017-07-04 DIAGNOSIS — I951 Orthostatic hypotension: Secondary | ICD-10-CM

## 2017-07-04 DIAGNOSIS — I498 Other specified cardiac arrhythmias: Secondary | ICD-10-CM

## 2017-07-04 DIAGNOSIS — G90A Postural orthostatic tachycardia syndrome (POTS): Secondary | ICD-10-CM

## 2017-07-04 DIAGNOSIS — R9431 Abnormal electrocardiogram [ECG] [EKG]: Secondary | ICD-10-CM | POA: Diagnosis not present

## 2017-07-04 NOTE — Patient Instructions (Addendum)
It was good to see you, take care and I will set you up to see cardiology about your concern of POTS syndrome, and to follow-up on your previous abnormal EKG I am glad that you are overall doing better!  Continue the self care methods that you have discovered as they seem to be working for you

## 2017-07-11 ENCOUNTER — Encounter: Payer: Self-pay | Admitting: Physician Assistant

## 2017-07-11 ENCOUNTER — Ambulatory Visit: Payer: BLUE CROSS/BLUE SHIELD | Admitting: Cardiology

## 2017-07-11 ENCOUNTER — Encounter: Payer: Self-pay | Admitting: Cardiology

## 2017-07-11 VITALS — BP 120/72 | HR 96 | Ht 66.0 in | Wt 177.8 lb

## 2017-07-11 DIAGNOSIS — R002 Palpitations: Secondary | ICD-10-CM

## 2017-07-11 DIAGNOSIS — R5383 Other fatigue: Secondary | ICD-10-CM | POA: Insufficient documentation

## 2017-07-11 DIAGNOSIS — R55 Syncope and collapse: Secondary | ICD-10-CM

## 2017-07-11 DIAGNOSIS — R000 Tachycardia, unspecified: Secondary | ICD-10-CM

## 2017-07-11 DIAGNOSIS — I4510 Unspecified right bundle-branch block: Secondary | ICD-10-CM | POA: Diagnosis not present

## 2017-07-11 NOTE — Progress Notes (Signed)
Cardiology Consultation:    Date:  07/11/2017   ID:  DENEANE STIFTER, DOB 1970-06-02, MRN [id number]  PCP:  Darreld Mclean, MD  Cardiologist:  Jenne Campus, MD   Referring MD: Darreld Mclean, MD   Chief Complaint  Patient presents with  . Irregular Heart Beat  I think I have POTS  History of Present Illness:    LAYLONI FAHRNER is a 47 y.o. female who is being seen today for the evaluation of tachycardia, palpitations.  At the request of Copland, Gay Filler, MD.  She is a very complicated lady with multiple problems.  She described having multiple episodes of syncope when she was teenager.  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  It is very embarrassing to her but she kind of got used to it.  Now she does not have those episodes but she does have constellation of a lot of symptoms.  Is very knowledgeable and intelligent.  She spent a lot of time reading about her condition and she got multiple teres about what potentially is wrong with her.  Lately she started reading about postural orthostatic tachycardia syndrome and she is convinced that she does have this problem.  She is kind of very happy with this because she said finally I know what is wrong with me.  Previously she told she may have some issues with her spine and she still thinks she may have Ehlers-Danlos syndrome.  Fine correlation between Ehlers-Danlos syndrome and postural orthostatic tachycardia syndrome which can affect to her.  That states when she has.  She did wear a fit bit and she did notice that her heart rate would increase upon standing.  Does have she made a diagnosis of pots.  She is also very concerned about her EKG which does have incomplete right bundle branch block as well as some extrasystole she will was told to have when she wear Holter monitor about a year ago.  Also very complicated situation.  Past Medical History:    Diagnosis Date  . Anxiety   . Arthritis   . Cervical intraepithelial neoplasia grade 3 2002   s/p LEEP Rx repeat pap negative  . Cervical radiculopathy   . Fallopian tube disorder    right fallopian tube removed  . Gallstones 09/2015   On CT  . Headache    Migraines  . Hemorrhagic ovarian cyst 02/08/2016  . Hydrosalpinx    [facility]  . Hydrosalpinx 02/08/2016  . Interstitial cystitis   . Neurological abnormality    Neg workup with MRI/MRA in 2007 WNL except decreased caliber MCA proximally, distal cavernous portion of left LCA which may represent true stenosis or technique on exam. Evaluated by Dr Linus Salmons.  . Palpitations   . Partial seizures (HCC)    questionable diag  . Pneumonia    walking pneumonia  . Polycystic ovary    multiple ovarian cysts removed  . S/P cervical discectomy    Dr Vertell Limber, anterior discectomy C5-C7  . S/P epidural steroid injection    Right hip  . Seizures ([place]) 2005   simple partial seizure disorder    Past Surgical History:  Procedure Laterality Date  . APPENDECTOMY  1986  . BUNIONECTOMY Left    bunion removal  . CERVICAL DISC SURGERY  2005   C5-C7  . COLONOSCOPY    . DENTAL SURGERY    . fallopian tue  removed    . LAPAROSCOPIC BILATERAL SALPINGECTOMY Left 02/09/2016   Procedure: LAPAROSCOPIC LEFT  SALPINGECTOMY;  Surgeon: Janyth Contes, MD;  Location: [facility];  Service: Gynecology;  Laterality: Left;  . LAPAROSCOPIC LYSIS OF ADHESIONS  02/09/2016   Procedure: LAPAROSCOPIC LYSIS OF ADHESIONS;  Surgeon: Janyth Contes, MD;  Location: [facility];  Service: Gynecology;;  momentum to adenexa  . LAPAROSCOPIC OVARIAN CYSTECTOMY Right 02/09/2016   Procedure: LAPAROSCOPIC OVARIAN CYSTECTOMY;  Surgeon: Janyth Contes, MD;  Location: [facility];  Service: Gynecology;  Laterality: Right;  x2 to right ovary  . LEEP  1998  . OOPHORECTOMY Left 02/09/2016   Procedure: LEFT OOPHORECTOMY;  Surgeon: Janyth Contes, MD;  Location:  [facility];  Service: Gynecology;  Laterality: Left;  . TONSILLECTOMY  2001  . UPPER GI ENDOSCOPY      Current Medications: Current Meds  Medication Sig  . famotidine (PEPCID) 20 MG tablet Take 1 tablet (20 mg total) by mouth daily. (Patient taking differently: Take 20 mg by mouth daily as needed. )  . loratadine (CLARITIN) 10 MG tablet Take 10 mg by mouth daily as needed.      Allergies:   Clindamycin/lincomycin; Codeine; Cyclobenzaprine; Gabapentin; Nyquil multi-symptom [pseudoeph-doxylamine-dm-apap]; Uribel [meth-hyo-m bl-na phos-ph sal]; and Xanax [alprazolam]   Social History   Socioeconomic History  . Marital status: Divorced    Spouse name: Not on file  . Number of children: 0  . Years of education: Not on file  . Highest education level: Not on file  Occupational History  . 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  . Occupation: Pharmacist, hospital  Social Needs  . Financial resource strain: Not on file  . Food insecurity:    Worry: Not on file    Inability: Not on file  . Transportation needs:    Medical: Not on file    Non-medical: Not on file  Tobacco Use  . Smoking status: Former Smoker    Packs/day: 0.00    Years: 0.00    Pack years: 0.00    Types: Cigarettes    Last attempt to quit: 11/02/2015    Years since quitting: 1.6  . Smokeless tobacco: Never Used  . Tobacco comment: currently smoking, began at beginning of June and plans to quit on June 14th. Has smoked off and on in the past.  Substance and Sexual Activity  . Alcohol use: No    Alcohol/week: 0.6 - 1.2 oz    Types: 1 - 2 Standard drinks or equivalent per week    Comment: occasionally 0-2 per day  . Drug use: No  . Sexual activity: Yes  Lifestyle  . Physical activity:    Days per week: Not on file    Minutes per session: Not on file  . Stress: Not on file  Relationships  . Social connections:    Talks on phone: Not on file    Gets together: Not on  file    Attends religious service: Not on file    Active member of club or organization: Not on file    Attends meetings of clubs or organizations: Not on file    Relationship status: Not on file  Other Topics Concern  . Not on file  Social History Narrative  . Not on file     Family History: The patient's family history includes Asthma in her brother; Breast cancer in her mother; COPD in her mother; Other in her brother, mother, and sister; Rashes /  Skin problems in her brother and sister. She was adopted. ROS:   Please see the history of present illness.    All 14 point review of systems negative except as described per history of present illness.  EKGs/Labs/Other Studies Reviewed:    The following studies were reviewed today: All Holter monitor reviewed there is nothing alarming.  There are a few extrasystole.  EKG:  EKG is  ordered today.  The ekg ordered today demonstrates normal sinus rhythm normal P interval incomplete right bundle branch block no ST segment changes  Recent Labs: 03/26/2017: ALT(SGPT) 47; BUN, Bld 7; Creat 0.8; HGB 15.1; Platelets 330; Potassium 4.4; Sodium 144  Recent Lipid Panel    Component Value Date/Time   CHOL 152 10/26/2008 1944   TRIG 52 10/26/2008 1944   HDL 46 10/26/2008 1944   CHOLHDL 3.3 Ratio 10/26/2008 1944   VLDL 10 10/26/2008 1944   LDLCALC 96 10/26/2008 1944   LDLDIRECT 120 12/16/2014 1618    Physical Exam:    VS:  BP 120/72   Pulse 96   Ht 5\' 6"  (1.676 m)   Wt 177 lb 12.8 oz (80.6 kg)   SpO2 98%   BMI 28.70 kg/m     Wt Readings from Last 3 Encounters:  07/11/17 177 lb 12.8 oz (80.6 kg)  07/04/17 176 lb 6.4 oz (80 kg)  03/26/17 178 lb (80.7 kg)     GEN:  Well nourished, well developed in no acute distress HEENT: Normal NECK: No JVD; No carotid bruits LYMPHATICS: No lymphadenopathy CARDIAC: RRR, no murmurs, no rubs, no gallops RESPIRATORY:  Clear to auscultation without rales, wheezing or rhonchi  ABDOMEN: Soft,  non-tender, non-distended MUSCULOSKELETAL:  No edema; No deformity  SKIN: Warm and dry NEUROLOGIC:  Alert and oriented x 3 PSYCHIATRIC:  Normal affect   ASSESSMENT:    1. PALPITATIONS, RECURRENT   2. TACHYCARDIA   3. Incomplete right bundle branch block   4. Fatigue, unspecified type    PLAN:    In order of problems listed above:  1. Tachycardia with some suspicion for POTS.  I will schedule her to have a tilt table test.  We did spent Gradle of time talking about this diagnosis what that means.  We did talk about measures that she can take to help with this syndrome.  She appears to be very knowledgeable about this condition.  She already liberated her fluid intake also liberated salt intake.  We did talk about exercises that she can do to help with this.  She is also aware that there is some medication that can be helpful however data regarding the outcome for this medication a very inconsistent.  There may be some trial of beta-blocker symptoms medially and seems to be helping sometimes SSI can be helpful.  I think we need to go to the basic and try to really truly establish diagnosis that is why tilt table test will be useful.  I will also ask you to have an echocardiogram to assess her left ventricular ejection fraction especially in view of the fact that she does have incomplete right bundle branch block.  We will also ask her to wear Holter monitor for 48 hours to see heart rate variability during the 48 hours. 2. Fatigue: Very nonspecific symptom.  I suspect that she does truly have some dysautonomia sprue for it is the fact that she clearly was vasovagal when she was a child and now she may have postural orthostatic tachycardia syndrome.  Future  she may benefit from being referred to this autonomic clinic. 3. Please right bundle branch block: We will do the echocardiogram   Medication Adjustments/Labs and Tests Ordered: Current medicines are reviewed at length with the patient today.   Concerns regarding medicines are outlined above.  No orders of the defined types were placed in this encounter.  No orders of the defined types were placed in this encounter.   Signed, Park Liter, MD, [facility] 07/11/2017 10:12 AM    [facility]

## 2017-07-11 NOTE — Patient Instructions (Addendum)
Medication Instructions:  Your physician recommends that you continue on your current medications as directed. Please refer to the Current Medication list given to you today.   Labwork: None  Testing/Procedures: You had an EKG today.   Your physician has recommended that you wear a holter monitor. Holter monitors are medical devices that record the heart's electrical activity. Doctors most often use these monitors to diagnose arrhythmias. Arrhythmias are problems with the speed or rhythm of the heartbeat. The monitor is a small, portable device. You can wear one while you do your normal daily activities. This is usually used to diagnose what is causing palpitations/syncope (passing out). Wear for 48 hours.  Your physician has requested that you have an echocardiogram. Echocardiography is a painless test that uses sound waves to create images of your heart. It provides your doctor with information about the size and shape of your heart and how well your heart's chambers and valves are working. This procedure takes approximately one hour. There are no restrictions for this procedure.  You will be contacted to schedule a tilt table test with electrophysiology.  Follow-Up: Your physician recommends that you schedule a follow-up appointment in: 1 month.  Any Other Special Instructions Will Be Listed Below (If Applicable).     If you need a refill on your cardiac medications before your next appointment, please call your pharmacy.

## 2017-07-19 ENCOUNTER — Telehealth: Payer: Self-pay | Admitting: Internal Medicine

## 2017-07-19 NOTE — Telephone Encounter (Signed)
New Message:     Pt is calling back with questions about the 5/2 appt and wants to know if there is sooner date as well.

## 2017-07-24 ENCOUNTER — Ambulatory Visit: Payer: BLUE CROSS/BLUE SHIELD | Admitting: Internal Medicine

## 2017-07-24 ENCOUNTER — Encounter: Payer: Self-pay | Admitting: Internal Medicine

## 2017-07-24 VITALS — BP 120/76 | HR 99 | Ht 66.0 in | Wt 177.0 lb

## 2017-07-24 DIAGNOSIS — R000 Tachycardia, unspecified: Secondary | ICD-10-CM | POA: Diagnosis not present

## 2017-07-24 DIAGNOSIS — I4510 Unspecified right bundle-branch block: Secondary | ICD-10-CM

## 2017-07-24 DIAGNOSIS — G901 Familial dysautonomia [Riley-Day]: Secondary | ICD-10-CM | POA: Diagnosis not present

## 2017-07-24 NOTE — Patient Instructions (Signed)
Medication Instructions:  Your physician recommends that you continue on your current medications as directed. Please refer to the Current Medication list given to you today.  Labwork: None ordered.  Testing/Procedures: None ordered.  Follow-Up: Your physician wants you to follow-up in 3 months with Dr Caryl Comes.  Any Other Special Instructions Will Be Listed Below (If Applicable).  1. Referral to [facility] 2. Horizontal Exercise 3. Reach out to your GYN MD to suppress your menses. 4. Follow up with Dr Carmelina Peal   If you need a refill on your cardiac medications before your next appointment, please call your pharmacy.

## 2017-07-24 NOTE — Progress Notes (Signed)
ELECTROPHYSIOLOGY CONSULT NOTE  Patient ID: Victoria Hall, MRN: [id number], DOB/AGE: September 24, 1970 47 y.o. Admit date: (Not on file) Date of Consult: 07/24/2017  Primary Physician: Darreld Mclean, MD Primary Cardiologist: RK     [name] is a 47 y.o. female who is being seen today for the evaluation of spells  at the request of DR copeland .    HPI VINISHA FAXON is a 47 y.o. female who comes in thinking she has POTS  She has a history of symptoms at the back to when she was a girl.  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  She was evaluated extensively and thought she had vasovagal syncope.  In her 9s she had symptoms of right-sided weakness I would recur over the years.  She was worked up for [employer].  She had a history of migraines.  More acephalic migraines.  She has had recurrent cycles where she has had diffuse body pain and then profound "pull the plug out of my life "fatigue that persists for a week.  It can be provoked by exercise but not necessarily.  She has a history of dinner plate size bruising.  Hives and rashes for which allergy testing was apparently negative.  She has been thought to have mast cell activation disorder and was treated with antihistamines with some improvement but her urine test was negative.  Other complaints include severe spinal arthritis which she has had some surgery" could have the rest of her spine operated on ".  Was negative for HLA-B27. She has had GI symptoms including irritable bowel diarrhea constipation nausea.  She has interstitial cystitis.  She has had problems with headaches with blurry vision and tinnitus. She is abdominal and extremity swelling unexplained rashes. Some episodes of been accompanied by olfactory hallucinations.  She was evaluated and treated with antiseizure medicine some years ago after diagnosis was made of trigeminal neuralgia.  She has nocturnal  palpitations associated with sweating.  She notices that her sweating is localized to her sternum.  She is a history of joint laxity.  She was able to do the splits she continues to have laxity in her hands.  She was clinic in torsion she said when she was younger.  she has had labral tears.  You are reading about mast cell disorder she came across the idea of dysautonomia.  She would take her heart rates lying and standing.  The chart describes a fit bit.  She said it was done by palpation.  Heart rates were increased from 75--135.  When she was seen by her PCP the heart rate increased from 81--114 but then with persistent standing decreased to 91  She is heat intolerant and shower intolerant.  Her diet is replete of fluid but depleted sodium.  She has been worried about labile hypertension.    Past Medical History:  Diagnosis Date  . Anxiety   . Arthritis   . Cervical intraepithelial neoplasia grade 3 2002   s/p LEEP Rx repeat pap negative  . Cervical radiculopathy   . Fallopian tube disorder    right fallopian tube removed  . Gallstones 09/2015   On CT  . Headache    Migraines  . Hemorrhagic ovarian cyst 02/08/2016  . Hydrosalpinx    [facility]  . Hydrosalpinx 02/08/2016  . Interstitial cystitis   . Neurological abnormality    Neg workup  with MRI/MRA in 2007 WNL except decreased caliber MCA proximally, distal cavernous portion of left LCA which may represent true stenosis or technique on exam. Evaluated by Dr Linus Salmons.  . Palpitations   . Partial seizures (HCC)    questionable diag  . Pneumonia    walking pneumonia  . Polycystic ovary    multiple ovarian cysts removed  . S/P cervical discectomy    Dr Vertell Limber, anterior discectomy C5-C7  . S/P epidural steroid injection    Right hip  . Seizures ([place]) 2005   simple partial seizure disorder      Surgical History:  Past Surgical History:  Procedure Laterality Date  . APPENDECTOMY  1986  . BUNIONECTOMY Left     bunion removal  . CERVICAL DISC SURGERY  2005   C5-C7  . COLONOSCOPY    . DENTAL SURGERY    . fallopian tue removed    . LAPAROSCOPIC BILATERAL SALPINGECTOMY Left 02/09/2016   Procedure: LAPAROSCOPIC LEFT  SALPINGECTOMY;  Surgeon: Janyth Contes, MD;  Location: [facility];  Service: Gynecology;  Laterality: Left;  . LAPAROSCOPIC LYSIS OF ADHESIONS  02/09/2016   Procedure: LAPAROSCOPIC LYSIS OF ADHESIONS;  Surgeon: Janyth Contes, MD;  Location: [facility];  Service: Gynecology;;  momentum to adenexa  . LAPAROSCOPIC OVARIAN CYSTECTOMY Right 02/09/2016   Procedure: LAPAROSCOPIC OVARIAN CYSTECTOMY;  Surgeon: Janyth Contes, MD;  Location: [facility];  Service: Gynecology;  Laterality: Right;  x2 to right ovary  . LEEP  1998  . OOPHORECTOMY Left 02/09/2016   Procedure: LEFT OOPHORECTOMY;  Surgeon: Janyth Contes, MD;  Location: [facility];  Service: Gynecology;  Laterality: Left;  . TONSILLECTOMY  2001  . UPPER GI ENDOSCOPY       Home Meds: Prior to Admission medications   Medication Sig Start Date End Date Taking? Authorizing Provider  famotidine (PEPCID) 20 MG tablet Take 20 mg by mouth daily as needed for heartburn or indigestion.   Yes [provider]  loratadine (CLARITIN) 10 MG tablet Take 10 mg by mouth daily as needed.    Yes [provider]    Allergies:  Allergies  Allergen Reactions  . Clindamycin/Lincomycin Itching and Other (See Comments)    Dizziness, trouble swallowing  . Codeine Itching and Nausea And Vomiting  . Cyclobenzaprine Other (See Comments)    Fast heartbeat, restless leg, nightmares  . Gabapentin Other (See Comments)    Headaches, visual disturbances.   . Nyquil Multi-Symptom [Pseudoeph-Doxylamine-Dm-Apap] Other (See Comments)    Heart racing, dizziness, weakness  . Uribel [Meth-Hyo-M Bl-Na Phos-Ph Sal] Other (See Comments)    Heart racing, dizziness, blurry vision.  . Xanax [Alprazolam]     Bruising    Social History    Socioeconomic History  . Marital status: Divorced    Spouse name: Not on file  . Number of children: 0  . Years of education: Not on file  . Highest education level: Not on file  Occupational History  . 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  . Occupation: Pharmacist, hospital  Social Needs  . Financial resource strain: Not on file  . Food insecurity:    Worry: Not on file    Inability: Not on file  . Transportation needs:    Medical: Not on file    Non-medical: Not on file  Tobacco Use  . Smoking status: Former Smoker    Packs/day: 0.00    Years: 0.00    Pack years: 0.00  Types: Cigarettes    Last attempt to quit: 11/02/2015    Years since quitting: 1.7  . Smokeless tobacco: Never Used  . Tobacco comment: currently smoking, began at beginning of June and plans to quit on June 14th. Has smoked off and on in the past.  Substance and Sexual Activity  . Alcohol use: No    Alcohol/week: 0.6 - 1.2 oz    Types: 1 - 2 Standard drinks or equivalent per week    Comment: occasionally 0-2 per day  . Drug use: No  . Sexual activity: Yes  Lifestyle  . Physical activity:    Days per week: Not on file    Minutes per session: Not on file  . Stress: Not on file  Relationships  . Social connections:    Talks on phone: Not on file    Gets together: Not on file    Attends religious service: Not on file    Active member of club or organization: Not on file    Attends meetings of clubs or organizations: Not on file    Relationship status: Not on file  . Intimate partner violence:    Fear of current or ex partner: Not on file    Emotionally abused: Not on file    Physically abused: Not on file    Forced sexual activity: Not on file  Other Topics Concern  . Not on file  Social History Narrative  . Not on file     Family History  Adopted: Yes  Problem Relation Age of Onset  . COPD Mother   . Breast cancer Mother   . Other Mother         Teeth problems- all removed by age 54  . Rashes / Skin problems Sister   . Other Sister        Teeth problems- all removed by age 42  . Rashes / Skin problems Brother   . Asthma Brother   . Other Brother        Teeth problems- all removed by age 108     ROS:  Please see the history of present illness.     All other systems reviewed and negative.    Physical Exam:  Blood pressure 120/76, pulse 99, height _0  (1.676 m), weight 177 lb (80.3 kg), SpO2 97 %. General: Well developed, well nourished female in no acute distress. Head: Normocephalic, atraumatic, sclera non-icteric, no xanthomas, nares are without discharge. EENT: normal  Lymph Nodes:  none Neck: Negative for carotid bruits. JVD not elevated. Back:without scoliosis kyphosis  Lungs: Clear bilaterally to auscultation without wheezes, rales, or rhonchi. Breathing is unlabored. Heart: RRR with S1 S2. No * murmur . No rubs, or gallops appreciated. Abdomen: Soft, non-tender, non-distended with normoactive bowel sounds. No hepatomegaly. No rebound/guarding. No obvious abdominal masses. Msk:  Strength and tone appear normal for age. Extremities: No clubbing or cyanosis. No  edema.  Distal pedal pulses are 2+ and equal bilaterally. Skin: Warm and Dry Neuro: Alert and oriented X 3. CN III-XII intact Grossly normal sensory and motor function . Psych:  Responds to questions appropriately with a normal affect.      Labs: Cardiac Enzymes No results for input(s): CKTOTAL, CKMB, TROPONINI in the last 72 hours. CBC Lab Results  Component Value Date   WBC 11.5 (H) 03/26/2017   HGB 15.1 03/26/2017   HCT 44.0 03/26/2017   MCV 84 03/26/2017   PLT 330 03/26/2017   PROTIME: No results for input(s):  LABPROT, INR in the last 72 hours. Chemistry No results for input(s): NA, K, CL, CO2, BUN, CREATININE, CALCIUM, PROT, BILITOT, ALKPHOS, ALT, AST, GLUCOSE in the last 168 hours.  Invalid input(s): LABALBU Lipids Lab Results   Component Value Date   CHOL 152 10/26/2008   HDL 46 10/26/2008   LDLCALC 96 10/26/2008   TRIG 52 10/26/2008   BNP No results found for: PROBNP Thyroid Function Tests: No results for input(s): TSH, T4TOTAL, T3FREE, THYROIDAB in the last 72 hours.  Invalid input(s): FREET3 Miscellaneous Lab Results  Component Value Date   DDIMER  09/08/2009    <0.22        AT THE INHOUSE ESTABLISHED CUTOFF VALUE OF 0.48 ug/mL FEU, THIS ASSAY HAS BEEN DOCUMENTED IN THE LITERATURE TO HAVE A SENSITIVITY AND NEGATIVE PREDICTIVE VALUE OF AT LEAST 98 TO 99%.  THE TEST RESULT SHOULD BE CORRELATED WITH AN ASSESSMENT OF THE CLINICAL PROBABILITY OF DVT / VTE.    Radiology/Studies:  No results found.  EKG: Sinus rhythm at 85   Assessment and Plan:  GI symptoms including nausea, pain, diarrhea and constipation  Neurological symptoms including headache, blurry vision, tinnitus, intermittent right-sided weakness  Tachypalpitations   Orthostatic intolerance without objective data today with suggestions from previous testing  Flushing and question of mast cell activation disorder with negative urine test.  She thinks the sample was mishandled without being Cold  Hypothermia with temperatures in the mid 90s  Hives rashes  Joint laxity syndrome  Spinal arthritis    She has an extensive collection of symptoms.  Some of them are suggestive of autonomic dysfunction and we have reviewed the symptomatic management of this including salt loading, fluid loading which she has already done, and exercise particularly recumbent exercise or aquatic exercise.  I have suggested with the breath of her symptoms and perhaps that evaluation is a place like the [facility] might be helpful in trying to pull all these various and sundry complaints over the decades together.  Of the 95-minute evaluation more than 50% of it was spent on counseling           Virl Axe

## 2017-07-25 ENCOUNTER — Other Ambulatory Visit: Payer: BLUE CROSS/BLUE SHIELD

## 2017-07-25 ENCOUNTER — Ambulatory Visit: Payer: BLUE CROSS/BLUE SHIELD | Admitting: Family

## 2017-08-01 ENCOUNTER — Institutional Professional Consult (permissible substitution): Payer: BLUE CROSS/BLUE SHIELD | Admitting: Cardiology

## 2017-08-02 ENCOUNTER — Institutional Professional Consult (permissible substitution): Payer: BLUE CROSS/BLUE SHIELD | Admitting: Internal Medicine

## 2017-08-03 ENCOUNTER — Ambulatory Visit (HOSPITAL_BASED_OUTPATIENT_CLINIC_OR_DEPARTMENT_OTHER)
Admission: RE | Admit: 2017-08-03 | Discharge: 2017-08-03 | Disposition: A | Discharge status: Home / Self Care | Payer: BLUE CROSS/BLUE SHIELD | Source: Ambulatory Visit | Attending: Cardiology | Admitting: Cardiology

## 2017-08-03 ENCOUNTER — Ambulatory Visit: Payer: BLUE CROSS/BLUE SHIELD

## 2017-08-03 DIAGNOSIS — I4510 Unspecified right bundle-branch block: Secondary | ICD-10-CM | POA: Insufficient documentation

## 2017-08-03 DIAGNOSIS — R5383 Other fatigue: Secondary | ICD-10-CM

## 2017-08-03 DIAGNOSIS — R000 Tachycardia, unspecified: Secondary | ICD-10-CM | POA: Insufficient documentation

## 2017-08-03 DIAGNOSIS — R002 Palpitations: Secondary | ICD-10-CM | POA: Diagnosis not present

## 2017-08-03 NOTE — Progress Notes (Signed)
Echocardiogram 2D Echocardiogram has been performed.  Victoria Hall 08/03/2017, 3:02 PM

## 2017-08-06 ENCOUNTER — Encounter (INDEPENDENT_AMBULATORY_CARE_PROVIDER_SITE_OTHER): Payer: Self-pay

## 2017-08-10 ENCOUNTER — Encounter: Payer: Self-pay | Admitting: Cardiology

## 2017-08-10 ENCOUNTER — Ambulatory Visit: Payer: BLUE CROSS/BLUE SHIELD | Admitting: Cardiology

## 2017-08-10 VITALS — BP 110/80 | HR 89 | Ht 65.0 in | Wt 176.0 lb

## 2017-08-10 DIAGNOSIS — R000 Tachycardia, unspecified: Secondary | ICD-10-CM | POA: Diagnosis not present

## 2017-08-10 DIAGNOSIS — R002 Palpitations: Secondary | ICD-10-CM | POA: Diagnosis not present

## 2017-08-10 DIAGNOSIS — I951 Orthostatic hypotension: Secondary | ICD-10-CM

## 2017-08-10 DIAGNOSIS — I498 Other specified cardiac arrhythmias: Secondary | ICD-10-CM | POA: Insufficient documentation

## 2017-08-10 DIAGNOSIS — G90A Postural orthostatic tachycardia syndrome (POTS): Secondary | ICD-10-CM | POA: Insufficient documentation

## 2017-08-10 NOTE — Progress Notes (Signed)
Cardiology Office Note:    Date:  08/10/2017   ID:  Victoria Hall, DOB 1970/07/27, MRN [id number]  PCP:  Darreld Mclean, MD  Cardiologist:  Jenne Campus, MD    Referring MD: Darreld Mclean, MD   Chief Complaint  Patient presents with  . Follow-up    1 month follow up after HM and echo  Still having episode of palpitations  History of Present Illness:    Victoria Hall is a 47 y.o. female with suspicion for postural orthostatic tachycardia syndrome.  She did have echocardiogram which showed normal left ventricular ejection fraction.  She also had Holter monitor which showed some PVCs and practically normal distribution of the heart rate over.  Of 24 hours.  In the meantime she is seeing Dr. Caryl Comes electrophysiologist who recommended follow-up with this autonomic clinic in [place].  I think is an excellent idea.  Past Medical History:  Diagnosis Date  . Anxiety   . Arthritis   . Cervical intraepithelial neoplasia grade 3 2002   s/p LEEP Rx repeat pap negative  . Cervical radiculopathy   . Fallopian tube disorder    right fallopian tube removed  . Gallstones 09/2015   On CT  . Headache    Migraines  . Hemorrhagic ovarian cyst 02/08/2016  . Hydrosalpinx    [facility]  . Hydrosalpinx 02/08/2016  . Interstitial cystitis   . Neurological abnormality    Neg workup with MRI/MRA in 2007 WNL except decreased caliber MCA proximally, distal cavernous portion of left LCA which may represent true stenosis or technique on exam. Evaluated by Dr Linus Salmons.  . Palpitations   . Partial seizures (HCC)    questionable diag  . Pneumonia    walking pneumonia  . Polycystic ovary    multiple ovarian cysts removed  . S/P cervical discectomy    Dr Vertell Limber, anterior discectomy C5-C7  . S/P epidural steroid injection    Right hip  . Seizures ([place]) 2005   simple partial seizure disorder    Past Surgical History:  Procedure Laterality Date  . APPENDECTOMY  1986  .  BUNIONECTOMY Left    bunion removal  . CERVICAL DISC SURGERY  2005   C5-C7  . COLONOSCOPY    . DENTAL SURGERY    . fallopian tue removed    . LAPAROSCOPIC BILATERAL SALPINGECTOMY Left 02/09/2016   Procedure: LAPAROSCOPIC LEFT  SALPINGECTOMY;  Surgeon: Janyth Contes, MD;  Location: [facility];  Service: Gynecology;  Laterality: Left;  . LAPAROSCOPIC LYSIS OF ADHESIONS  02/09/2016   Procedure: LAPAROSCOPIC LYSIS OF ADHESIONS;  Surgeon: Janyth Contes, MD;  Location: [facility];  Service: Gynecology;;  momentum to adenexa  . LAPAROSCOPIC OVARIAN CYSTECTOMY Right 02/09/2016   Procedure: LAPAROSCOPIC OVARIAN CYSTECTOMY;  Surgeon: Janyth Contes, MD;  Location: [facility];  Service: Gynecology;  Laterality: Right;  x2 to right ovary  . LEEP  1998  . OOPHORECTOMY Left 02/09/2016   Procedure: LEFT OOPHORECTOMY;  Surgeon: Janyth Contes, MD;  Location: [facility];  Service: Gynecology;  Laterality: Left;  . TONSILLECTOMY  2001  . UPPER GI ENDOSCOPY      Current Medications: Current Meds  Medication Sig  . famotidine (PEPCID) 20 MG tablet Take 20 mg by mouth daily as needed for heartburn or indigestion.  Marland Kitchen loratadine (CLARITIN) 10 MG tablet Take 10 mg by mouth daily as needed.      Allergies:   Clindamycin/lincomycin; Codeine; Cyclobenzaprine; Gabapentin; Nyquil multi-symptom [pseudoeph-doxylamine-dm-apap]; Uribel [meth-hyo-m bl-na phos-ph sal];  and Xanax [alprazolam]   Social History   Socioeconomic History  . Marital status: Divorced    Spouse name: Not on file  . Number of children: 0  . Years of education: Not on file  . Highest education level: Not on file  Occupational History  . 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  . Occupation: Pharmacist, hospital  Social Needs  . Financial resource strain: Not on file  . Food insecurity:    Worry: Not on file    Inability: Not on file  . Transportation needs:    Medical: Not on file      Non-medical: Not on file  Tobacco Use  . Smoking status: Former Smoker    Packs/day: 0.00    Years: 0.00    Pack years: 0.00    Types: Cigarettes    Last attempt to quit: 11/02/2015    Years since quitting: 1.7  . Smokeless tobacco: Never Used  . Tobacco comment: currently smoking, began at beginning of June and plans to quit on June 14th. Has smoked off and on in the past.  Substance and Sexual Activity  . Alcohol use: No    Alcohol/week: 0.6 - 1.2 oz    Types: 1 - 2 Standard drinks or equivalent per week    Comment: occasionally 0-2 per day  . Drug use: No  . Sexual activity: Yes  Lifestyle  . Physical activity:    Days per week: Not on file    Minutes per session: Not on file  . Stress: Not on file  Relationships  . Social connections:    Talks on phone: Not on file    Gets together: Not on file    Attends religious service: Not on file    Active member of club or organization: Not on file    Attends meetings of clubs or organizations: Not on file    Relationship status: Not on file  Other Topics Concern  . Not on file  Social History Narrative  . Not on file     Family History: The patient's family history includes Asthma in her brother; Breast cancer in her mother; COPD in her mother; Other in her brother, mother, and sister; Rashes / Skin problems in her brother and sister. She was adopted. ROS:   Please see the history of present illness.    All 14 point review of systems negative except as described per history of present illness  EKGs/Labs/Other Studies Reviewed:      Recent Labs: 03/26/2017: ALT(SGPT) 47; BUN, Bld 7; Creat 0.8; HGB 15.1; Platelets 330; Potassium 4.4; Sodium 144  Recent Lipid Panel    Component Value Date/Time   CHOL 152 10/26/2008 1944   TRIG 52 10/26/2008 1944   HDL 46 10/26/2008 1944   CHOLHDL 3.3 Ratio 10/26/2008 1944   VLDL 10 10/26/2008 1944   LDLCALC 96 10/26/2008 1944   LDLDIRECT 120 12/16/2014 1618    Physical Exam:     VS:  BP 110/80 (BP Location: Left Arm, Patient Position: Sitting, Cuff Size: Normal)   Pulse 89   Ht 5\' 5"  (1.651 m)   Wt 176 lb (79.8 kg)   SpO2 98%   BMI 29.29 kg/m     Wt Readings from Last 3 Encounters:  08/10/17 176 lb (79.8 kg)  07/24/17 177 lb (80.3 kg)  07/11/17 177 lb 12.8 oz (80.6 kg)     GEN:  Well nourished, well developed in no  acute distress HEENT: Normal NECK: No JVD; No carotid bruits LYMPHATICS: No lymphadenopathy CARDIAC: RRR, no murmurs, no rubs, no gallops RESPIRATORY:  Clear to auscultation without rales, wheezing or rhonchi  ABDOMEN: Soft, non-tender, non-distended MUSCULOSKELETAL:  No edema; No deformity  SKIN: Warm and dry LOWER EXTREMITIES: no swelling NEUROLOGIC:  Alert and oriented x 3 PSYCHIATRIC:  Normal affect   ASSESSMENT:    1. PALPITATIONS, RECURRENT   2. POTS (postural orthostatic tachycardia syndrome)    PLAN:    In order of problems listed above:  1. Palpitations: Holter monitor showed only some PVCs. 2. Especially for postural orthostatic tachycardia syndrome.  [facility] will be [facility] in [facility]. 3. Hypermobility of her joints.  Again this will be investigated at [facility].  I see her back in my office in about 2 months or sooner if she has a problem   Medication Adjustments/Labs and Tests Ordered: Current medicines are reviewed at length with the patient today.  Concerns regarding medicines are outlined above.  No orders of the defined types were placed in this encounter.  Medication changes: No orders of the defined types were placed in this encounter.   Signed, Park Liter, MD, [facility] 08/10/2017 10:15 AM    [facility]

## 2017-08-10 NOTE — Patient Instructions (Signed)
Medication Instructions:   Your physician recommends that you continue on your current medications as directed. Please refer to the Current Medication list given to you today.  Labwork:  None  Testing/Procedures:  None  Follow-Up:  Your physician recommends that you schedule a follow-up appointment in: 2 months.  Any Other Special Instructions Will Be Listed Below (If Applicable).  If you need a refill on your cardiac medications before your next appointment, please call your pharmacy. 

## 2017-08-27 ENCOUNTER — Encounter: Payer: Self-pay | Admitting: Cardiology

## 2017-08-28 ENCOUNTER — Encounter: Payer: Self-pay | Admitting: Family Medicine

## 2017-08-29 ENCOUNTER — Telehealth: Payer: Self-pay

## 2017-08-29 NOTE — Telephone Encounter (Signed)
Returned patient [employer].

## 2017-08-29 NOTE — Telephone Encounter (Signed)
Copied from [place] [id number]. Topic: General - Other >> Aug 29, 2017  8:45 AM Synthia Innocent wrote: Reason for CRM: checking status of mychart message. Please advise

## 2017-08-29 NOTE — Telephone Encounter (Signed)
Referral to [facility] has been placed. Pt will receive a letter in the mail in the next few weeks with instructions as to how to make her appointment. She has been approved for an appointment.  Her [facility] MRN is [id number]

## 2017-09-04 DIAGNOSIS — F4312 Post-traumatic stress disorder, chronic: Secondary | ICD-10-CM | POA: Diagnosis not present

## 2017-09-10 DIAGNOSIS — F4312 Post-traumatic stress disorder, chronic: Secondary | ICD-10-CM | POA: Diagnosis not present

## 2017-09-19 ENCOUNTER — Encounter: Payer: Self-pay | Admitting: Family Medicine

## 2017-09-19 ENCOUNTER — Ambulatory Visit: Payer: BLUE CROSS/BLUE SHIELD | Admitting: Family Medicine

## 2017-09-19 ENCOUNTER — Ambulatory Visit (HOSPITAL_BASED_OUTPATIENT_CLINIC_OR_DEPARTMENT_OTHER)
Admission: RE | Admit: 2017-09-19 | Discharge: 2017-09-19 | Disposition: A | Discharge status: Home / Self Care | Payer: BLUE CROSS/BLUE SHIELD | Source: Ambulatory Visit | Attending: Family Medicine | Admitting: Family Medicine

## 2017-09-19 VITALS — BP 110/76 | HR 94 | Temp 98.7°F | Resp 16 | Ht 65.0 in | Wt 174.8 lb

## 2017-09-19 DIAGNOSIS — R509 Fever, unspecified: Secondary | ICD-10-CM | POA: Insufficient documentation

## 2017-09-19 DIAGNOSIS — J208 Acute bronchitis due to other specified organisms: Secondary | ICD-10-CM

## 2017-09-19 DIAGNOSIS — R05 Cough: Secondary | ICD-10-CM

## 2017-09-19 DIAGNOSIS — R059 Cough, unspecified: Secondary | ICD-10-CM

## 2017-09-19 DIAGNOSIS — Z792 Long term (current) use of antibiotics: Secondary | ICD-10-CM

## 2017-09-19 MED ORDER — DOXYCYCLINE HYCLATE 100 MG PO CAPS: 100.0000 mg | ORAL_CAPSULE | Freq: Two times a day (BID) | ORAL | 0 refills | Status: DC

## 2017-09-19 MED ORDER — FLUCONAZOLE 150 MG PO TABS: 150.0000 mg | ORAL_TABLET | Freq: Once | ORAL | 0 refills | Status: AC

## 2017-09-19 NOTE — Patient Instructions (Addendum)
We are going to use a course of doxycycline for your recent cough and fever.  Let me know if these sx do not resolve or if you are getting worse at all Use the diflucan if needed for yeast infection A probiotic may be helpful for you

## 2017-09-19 NOTE — Progress Notes (Signed)
[name] [facility] [address] [place], [place] [address] [phone]  Date:  09/19/2017   Name:  Victoria Hall   DOB:  03-17-1971   MRN:  [id number]  PCP:  Victoria Mclean, MD    Chief Complaint: No chief complaint on file.   History of Present Illness:  Victoria Hall is a 47 y.o. very pleasant female patient who presents with the following:  Here today with illness for about 10 days Her husband has been sick as well Her ears are itching Sinuses are burning and aching Benadryl helped for a little while She had a fever a week ago up to 104.6- this eventually went away, and she has  She has some cough  She has some sinus pressure but this is better- her sx are now more in her chest She is coughing up some material that is bright yellow She tried a sinus rinse and this burning a lot- usually it does not bother her to do this She did have a mild ST at first, now better Some diarrhea/ constipation but this is normal for her  No vomiting No rash noted in particular  Temp to 100.2 last night  Victoria Hall today is also concerned that she had Victoria Hall syndrome and would like to be evaluated for same- she brings in some paperwork for me to look at. Advised her that we will need to make another appt to have time to thoroually  evaluate this concern and she plans to do Per most recent note by Dr. Caryl Comes:  She has an extensive collection of symptoms.  Some of them are suggestive of autonomic dysfunction and we have reviewed the symptomatic management of this including salt loading, fluid loading which she has already done, and exercise particularly recumbent exercise or aquatic exercise.  I have suggested with the breath of her symptoms and perhaps that evaluation is a place like the [facility] might be helpful in trying to pull all these various and sundry complaints over the decades together.     Patient Active Problem  List   Diagnosis Date Noted  . POTS (postural orthostatic tachycardia syndrome) 08/10/2017  . Incomplete right bundle branch block 07/11/2017  . Fatigue 07/11/2017  . Low back pain 07/21/2016  . Right knee pain 06/05/2016  . Left shoulder pain 06/05/2016  . Hydrosalpinx 02/08/2016  . Hemorrhagic ovarian cyst 02/08/2016  . Right hip pain 02/03/2016  . MUSCLE SPASM, BACK 06/08/2009  . HYDROSALPINX 02/07/2008  . HEADACHE 10/28/2007  . TACHYCARDIA 10/14/2007  . ANXIETY DISORDER 05/28/2007  . POLYARTHRITIS 05/09/2007  . UNSPECIFIED DISORDERS OF NERVOUS SYSTEM 04/22/2007  . ABDOMINAL PAIN RIGHT LOWER QUADRANT 04/08/2007  . PALPITATIONS, RECURRENT 11/07/2006  . PROBLEMS W/SMELL/TASTE 06/29/2006  . TOBACCO ABUSE 01/08/2006  . VISUAL IMPAIRMENT 01/08/2006  . DISTURBANCE, VISUAL NOS 01/08/2006  . FIBROCYSTIC BREAST DISEASE 01/08/2006  . [place] DISEASE, CERVICAL 01/08/2006  . VERTIGO 01/08/2006    Past Medical History:  Diagnosis Date  . Anxiety   . Arthritis   . Cervical intraepithelial neoplasia grade 3 2002   s/p LEEP Rx repeat pap negative  . Cervical radiculopathy   . Fallopian tube disorder    right fallopian tube removed  . Gallstones 09/2015   On CT  . Headache    Migraines  . Hemorrhagic ovarian cyst 02/08/2016  . Hydrosalpinx    [facility]  . Hydrosalpinx 02/08/2016  . Interstitial  cystitis   . Neurological abnormality    Neg workup with MRI/MRA in 2007 WNL except decreased caliber MCA proximally, distal cavernous portion of left LCA which may represent true stenosis or technique on exam. Evaluated by Dr Linus Salmons.  . Palpitations   . Partial seizures (HCC)    questionable diag  . Pneumonia    walking pneumonia  . Polycystic ovary    multiple ovarian cysts removed  . S/P cervical discectomy    Dr Vertell Limber, anterior discectomy C5-C7  . S/P epidural steroid injection    Right hip  . Seizures ([place]) 2005   simple partial seizure disorder    Past  Surgical History:  Procedure Laterality Date  . APPENDECTOMY  1986  . BUNIONECTOMY Left    bunion removal  . CERVICAL DISC SURGERY  2005   C5-C7  . COLONOSCOPY    . DENTAL SURGERY    . fallopian tue removed    . LAPAROSCOPIC BILATERAL SALPINGECTOMY Left 02/09/2016   Procedure: LAPAROSCOPIC LEFT  SALPINGECTOMY;  Surgeon: Janyth Contes, MD;  Location: [facility];  Service: Gynecology;  Laterality: Left;  . LAPAROSCOPIC LYSIS OF ADHESIONS  02/09/2016   Procedure: LAPAROSCOPIC LYSIS OF ADHESIONS;  Surgeon: Janyth Contes, MD;  Location: [facility];  Service: Gynecology;;  momentum to adenexa  . LAPAROSCOPIC OVARIAN CYSTECTOMY Right 02/09/2016   Procedure: LAPAROSCOPIC OVARIAN CYSTECTOMY;  Surgeon: Janyth Contes, MD;  Location: [facility];  Service: Gynecology;  Laterality: Right;  x2 to right ovary  . LEEP  1998  . OOPHORECTOMY Left 02/09/2016   Procedure: LEFT OOPHORECTOMY;  Surgeon: Janyth Contes, MD;  Location: [facility];  Service: Gynecology;  Laterality: Left;  . TONSILLECTOMY  2001  . UPPER GI ENDOSCOPY      Social History   Tobacco Use  . Smoking status: Former Smoker    Packs/day: 0.00    Years: 0.00    Pack years: 0.00    Types: Cigarettes    Last attempt to quit: 11/02/2015    Years since quitting: 1.8  . Smokeless tobacco: Never Used  . Tobacco comment: currently smoking, began at beginning of June and plans to quit on June 14th. Has smoked off and on in the past.  Substance Use Topics  . Alcohol use: No    Alcohol/week: 0.6 - 1.2 oz    Types: 1 - 2 Standard drinks or equivalent per week    Comment: occasionally 0-2 per day  . Drug use: No    Family History  Adopted: Yes  Problem Relation Age of Onset  . COPD Mother   . Breast cancer Mother   . Other Mother        Teeth problems- all removed by age 3  . Rashes / Skin problems Sister   . Other Sister        Teeth problems- all removed by age 1  . Rashes / Skin problems Brother   . Asthma Brother    . Other Brother        Teeth problems- all removed by age 49    Allergies  Allergen Reactions  . Clindamycin/Lincomycin Itching and Other (See Comments)    Dizziness, trouble swallowing  . Codeine Itching and Nausea And Vomiting  . Cyclobenzaprine Other (See Comments)    Fast heartbeat, restless leg, nightmares  . Gabapentin Other (See Comments)    Headaches, visual disturbances.   . Nyquil Multi-Symptom [Pseudoeph-Doxylamine-Dm-Apap] Other (See Comments)    Heart racing, dizziness, weakness  . Uribel [Meth-Hyo-M Bl-Na Phos-Ph  Sal] Other (See Comments)    Heart racing, dizziness, blurry vision.  . Xanax [Alprazolam]     Bruising    Medication list has been reviewed and updated.  Current Outpatient Medications on File Prior to Visit  Medication Sig Dispense Refill  . famotidine (PEPCID) 20 MG tablet Take 20 mg by mouth daily as needed for heartburn or indigestion.    Marland Kitchen loratadine (CLARITIN) 10 MG tablet Take 10 mg by mouth daily as needed.      No current facility-administered medications on file prior to visit.     Review of Systems:  As per HPI- otherwise negative. She notes several mysterious symptoms such as her armpits hurting intermittently and various subque nodules.   Reports that she has been referred to the [facility] clinic by cardiology to evaluate her sx and is waiting on this appt   Physical Examination: Vitals:   09/19/17 0934  BP: 110/76  Pulse: 94  Resp: 16  Temp: 98.7 F (37.1 C)  SpO2: 98%   Vitals:   09/19/17 0934  Weight: 174 lb 12.8 oz (79.3 kg)  Height: 5\' 5"  (1.651 m)   Body mass index is 29.09 kg/m. Ideal Body Weight: Weight in (lb) to have BMI = 25: 149.9  GEN: WDWN, NAD, Non-toxic, A & O x 3 HEENT: Atraumatic, Normocephalic. Neck supple. No masses, No LAD.  Bilateral TM wnl, oropharynx normal.  PEERL,EOMI.   Ears and Nose: No external deformity. CV: RRR, No M/G/R. No JVD. No thrill. No extra heart sounds. PULM: No wheezes, No  retractions. No resp. distress. No accessory muscle use. ronchi bilaterally  ABD: S, NT, ND, +BS. No rebound. No HSM. EXTR: No c/c/e NEURO Normal gait.  PSYCH: Normally interactive. Conversant. Not depressed or anxious appearing.  Calm demeanor.  Overweight, looks well, occasional cough   Dg Chest 2 View  Result Date: 09/19/2017 CLINICAL DATA:  Cough and fever for 9 days. EXAM: CHEST - 2 VIEW COMPARISON:  PA and lateral chest 03/14/2016. FINDINGS: The lungs are clear. Heart size is normal. No pneumothorax or pleural effusion. No acute bony abnormality. IMPRESSION: No acute disease. Electronically Signed   By: Inge Rise M.D.   On: 09/19/2017 10:10    Assessment and Plan: Cough - Plan: DG Chest 2 View, doxycycline (VIBRAMYCIN) 100 MG capsule  Acute bronchitis due to other specified organisms - Plan: doxycycline (VIBRAMYCIN) 100 MG capsule  Antibiotic long-term use - Plan: fluconazole (DIFLUCAN) 150 MG tablet  Here today with cough and some recent fevers- her temp was quite high a week ago, ok today but will cover for bronchitis with doxycycline  Asked her to come in for a 30 minute appt to discuss her suspicious of ehlers Hall syndrome which she is glad to do   Signed Lamar Blinks, MD

## 2017-09-24 ENCOUNTER — Encounter: Payer: Self-pay | Admitting: Family Medicine

## 2017-09-24 ENCOUNTER — Ambulatory Visit: Payer: BLUE CROSS/BLUE SHIELD | Admitting: Family Medicine

## 2017-09-24 ENCOUNTER — Ambulatory Visit (HOSPITAL_BASED_OUTPATIENT_CLINIC_OR_DEPARTMENT_OTHER)
Admission: RE | Admit: 2017-09-24 | Discharge: 2017-09-24 | Disposition: A | Discharge status: Home / Self Care | Payer: BLUE CROSS/BLUE SHIELD | Source: Ambulatory Visit | Attending: Family Medicine | Admitting: Family Medicine

## 2017-09-24 VITALS — BP 120/80 | HR 104 | Resp 16 | Ht 65.0 in | Wt 175.0 lb

## 2017-09-24 DIAGNOSIS — Z981 Arthrodesis status: Secondary | ICD-10-CM | POA: Insufficient documentation

## 2017-09-24 DIAGNOSIS — M4602 Spinal enthesopathy, cervical region: Secondary | ICD-10-CM | POA: Diagnosis not present

## 2017-09-24 DIAGNOSIS — I951 Orthostatic hypotension: Secondary | ICD-10-CM | POA: Diagnosis not present

## 2017-09-24 DIAGNOSIS — M357 Hypermobility syndrome: Secondary | ICD-10-CM | POA: Diagnosis not present

## 2017-09-24 DIAGNOSIS — R000 Tachycardia, unspecified: Secondary | ICD-10-CM | POA: Diagnosis not present

## 2017-09-24 DIAGNOSIS — M50321 Other cervical disc degeneration at C4-C5 level: Secondary | ICD-10-CM | POA: Diagnosis not present

## 2017-09-24 DIAGNOSIS — M542 Cervicalgia: Secondary | ICD-10-CM | POA: Diagnosis not present

## 2017-09-24 DIAGNOSIS — D8940 Mast cell activation, unspecified: Secondary | ICD-10-CM

## 2017-09-24 DIAGNOSIS — I498 Other specified cardiac arrhythmias: Secondary | ICD-10-CM

## 2017-09-24 DIAGNOSIS — M2550 Pain in unspecified joint: Secondary | ICD-10-CM | POA: Diagnosis not present

## 2017-09-24 DIAGNOSIS — G90A Postural orthostatic tachycardia syndrome (POTS): Secondary | ICD-10-CM

## 2017-09-24 MED ORDER — CROMOLYN SODIUM 100 MG/5ML PO CONC: 200.0000 mg | Freq: Three times a day (TID) | ORAL | 12 refills | Status: DC

## 2017-09-24 MED ORDER — TIZANIDINE HCL 2 MG PO TABS: 2.0000 mg | ORAL_TABLET | Freq: Four times a day (QID) | ORAL | 0 refills | Status: DC | PRN

## 2017-09-24 NOTE — Patient Instructions (Addendum)
It was good to see you today- I do agree that you appear to have a joint hypermobility syndrome I will set you up with PT- [facility] offers water therapy at their [place] location if you want to give them a call  Darrol Angel: [phone]  Let's get plain films of your neck today to look for any suggestion of atlantoaxial instability   We can try cromolyn up to 4x a day for mast cell activation disorder- you will take 10 ml before meals and at bedtime.  Let me know if this seems to help We will also have you try zanaflex (muscle relaxer, tizanidine) as needed for muscle pains and stiffness.
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
## 2017-09-25 DIAGNOSIS — F4312 Post-traumatic stress disorder, chronic: Secondary | ICD-10-CM | POA: Diagnosis not present

## 2017-09-26 ENCOUNTER — Encounter: Payer: Self-pay | Admitting: Family Medicine

## 2017-09-26 DIAGNOSIS — D8940 Mast cell activation, unspecified: Secondary | ICD-10-CM | POA: Insufficient documentation

## 2017-09-26 DIAGNOSIS — M357 Hypermobility syndrome: Secondary | ICD-10-CM | POA: Insufficient documentation

## 2017-09-27 ENCOUNTER — Encounter: Payer: Self-pay | Admitting: Family Medicine

## 2017-09-27 DIAGNOSIS — M357 Hypermobility syndrome: Secondary | ICD-10-CM

## 2017-09-30 ENCOUNTER — Ambulatory Visit (HOSPITAL_BASED_OUTPATIENT_CLINIC_OR_DEPARTMENT_OTHER)
Admission: RE | Admit: 2017-09-30 | Discharge: 2017-09-30 | Disposition: A | Discharge status: Home / Self Care | Payer: BLUE CROSS/BLUE SHIELD | Source: Ambulatory Visit | Attending: Family Medicine | Admitting: Family Medicine

## 2017-09-30 DIAGNOSIS — M357 Hypermobility syndrome: Secondary | ICD-10-CM | POA: Insufficient documentation

## 2017-09-30 DIAGNOSIS — M542 Cervicalgia: Secondary | ICD-10-CM | POA: Diagnosis not present

## 2017-10-01 DIAGNOSIS — M79604 Pain in right leg: Secondary | ICD-10-CM | POA: Diagnosis not present

## 2017-10-01 DIAGNOSIS — M357 Hypermobility syndrome: Secondary | ICD-10-CM | POA: Diagnosis not present

## 2017-10-01 DIAGNOSIS — M542 Cervicalgia: Secondary | ICD-10-CM | POA: Diagnosis not present

## 2017-10-01 DIAGNOSIS — M79601 Pain in right arm: Secondary | ICD-10-CM | POA: Diagnosis not present

## 2017-10-02 ENCOUNTER — Encounter: Payer: Self-pay | Admitting: Family Medicine

## 2017-10-03 DIAGNOSIS — F4312 Post-traumatic stress disorder, chronic: Secondary | ICD-10-CM | POA: Diagnosis not present

## 2017-10-09 DIAGNOSIS — M79601 Pain in right arm: Secondary | ICD-10-CM | POA: Diagnosis not present

## 2017-10-09 DIAGNOSIS — M357 Hypermobility syndrome: Secondary | ICD-10-CM | POA: Diagnosis not present

## 2017-10-09 DIAGNOSIS — M79602 Pain in left arm: Secondary | ICD-10-CM | POA: Diagnosis not present

## 2017-10-09 DIAGNOSIS — M542 Cervicalgia: Secondary | ICD-10-CM | POA: Diagnosis not present

## 2017-10-12 DIAGNOSIS — M79601 Pain in right arm: Secondary | ICD-10-CM | POA: Diagnosis not present

## 2017-10-12 DIAGNOSIS — M357 Hypermobility syndrome: Secondary | ICD-10-CM | POA: Diagnosis not present

## 2017-10-12 DIAGNOSIS — M542 Cervicalgia: Secondary | ICD-10-CM | POA: Diagnosis not present

## 2017-10-12 DIAGNOSIS — M79602 Pain in left arm: Secondary | ICD-10-CM | POA: Diagnosis not present

## 2017-10-23 DIAGNOSIS — F4312 Post-traumatic stress disorder, chronic: Secondary | ICD-10-CM | POA: Diagnosis not present

## 2017-10-30 DIAGNOSIS — M79602 Pain in left arm: Secondary | ICD-10-CM | POA: Diagnosis not present

## 2017-10-30 DIAGNOSIS — M79601 Pain in right arm: Secondary | ICD-10-CM | POA: Diagnosis not present

## 2017-10-30 DIAGNOSIS — M542 Cervicalgia: Secondary | ICD-10-CM | POA: Diagnosis not present

## 2017-10-30 DIAGNOSIS — M357 Hypermobility syndrome: Secondary | ICD-10-CM | POA: Diagnosis not present

## 2017-11-08 DIAGNOSIS — F4312 Post-traumatic stress disorder, chronic: Secondary | ICD-10-CM | POA: Diagnosis not present

## 2017-11-13 DIAGNOSIS — F4312 Post-traumatic stress disorder, chronic: Secondary | ICD-10-CM | POA: Diagnosis not present

## 2017-11-19 ENCOUNTER — Ambulatory Visit: Payer: BLUE CROSS/BLUE SHIELD | Admitting: Internal Medicine

## 2017-11-20 DIAGNOSIS — F4312 Post-traumatic stress disorder, chronic: Secondary | ICD-10-CM | POA: Diagnosis not present

## 2017-11-23 DIAGNOSIS — M542 Cervicalgia: Secondary | ICD-10-CM | POA: Diagnosis not present

## 2017-11-23 DIAGNOSIS — M79601 Pain in right arm: Secondary | ICD-10-CM | POA: Diagnosis not present

## 2017-11-23 DIAGNOSIS — M79602 Pain in left arm: Secondary | ICD-10-CM | POA: Diagnosis not present

## 2017-11-23 DIAGNOSIS — M357 Hypermobility syndrome: Secondary | ICD-10-CM | POA: Diagnosis not present

## 2017-11-27 DIAGNOSIS — F4312 Post-traumatic stress disorder, chronic: Secondary | ICD-10-CM | POA: Diagnosis not present

## 2017-12-19 ENCOUNTER — Encounter: Payer: Self-pay | Admitting: Family Medicine

## 2017-12-20 ENCOUNTER — Encounter: Payer: Self-pay | Admitting: Family Medicine

## 2017-12-20 ENCOUNTER — Other Ambulatory Visit (INDEPENDENT_AMBULATORY_CARE_PROVIDER_SITE_OTHER): Payer: BLUE CROSS/BLUE SHIELD

## 2017-12-20 LAB — CBC
HCT: 42.9 % (ref 36.0–46.0)
Hemoglobin: 14.5 g/dL (ref 12.0–15.0)
MCHC: 33.9 g/dL (ref 30.0–36.0)
MCV: 83.4 fl (ref 78.0–100.0)
Platelets: 283 10*3/uL (ref 150.0–400.0)
RBC: 5.14 Mil/uL — ABNORMAL HIGH (ref 3.87–5.11)
RDW: 14 % (ref 11.5–15.5)
WBC: 12.9 10*3/uL — ABNORMAL HIGH (ref 4.0–10.5)

## 2017-12-20 LAB — IRON: Iron: 114 ug/dL (ref 42–145)

## 2017-12-20 LAB — FERRITIN: Ferritin: 21.3 ng/mL (ref 10.0–291.0)

## 2018-02-13 DIAGNOSIS — F4312 Post-traumatic stress disorder, chronic: Secondary | ICD-10-CM | POA: Diagnosis not present

## 2018-03-19 ENCOUNTER — Ambulatory Visit: Payer: BLUE CROSS/BLUE SHIELD | Admitting: Internal Medicine

## 2018-04-03 NOTE — Progress Notes (Addendum)
[place] at [name] [address] [place], [place] [address] [phone]  Date:  04/04/2018   Name:  Victoria Hall   DOB:  1970-10-29   MRN:  [id number]  PCP:  Darreld Mclean, MD    Chief Complaint: Neck Pain (since christmas day, trouble laying down and turning head, pushing or pulling, "feels muscular" improved since then, pain with breathing) and Muscle Strain (recurrent muscle strains-no known injury, advil does not help)   History of Present Illness:  Victoria Hall is a 48 y.o. very pleasant female patient who presents with the following:  Here today with concern of acute illness Last seen by myself in June.  At that point she had concern of hypermobility syndrome and she did appear to have hypermobile joints.  At our last visit she planned an upcoming visit to the [facility] to discuss this concern and her many other somatic symptoms. She did end up being seen at [facility] and gives me a summary of their findings today  She reports that they were not able to determine the cause of her low BP.   They did agree that her BP was low.  They recommended following her iron levels; they reported low ferritin. She tried to take po iron but it bothered her stomach too much.  She is now trying to get more iron in her diet They do think that some of her sx are allergic and related to her mast cells; she is seeing her allergist later on this month   Today she notes a possible muscle pull in her neck on 12/25.  It seemed to get worse over the next several days, she used ice and heat.  It hurt to reach or pull with the left arm, hurts to turn or twist her neck and torso, and her to take a deep breath.  However, she was not having shortness of breath or chest pain.  as of 12/31 it seemed to be getting better and is continuing to improve now.  At this time her sx are much less   She kept the appt however as she notes that she seems to pull  muscles on a frequent basis. She wonders how we can prevent this.  She notes that she is already careful to warm up gradually, and has plenty of stretching exercises  She has tried flexeril in the past but it caused nightmares She has some tizanidine on hand (she thinks, I wrote for this in June)but has not tried it yet  She is using CBD and this does seem to help with her various muscle aches and pains.  She will use NSAIDs when as necessary, but tries to avoid these due to stomach upset  Flu shot: declines  Pap smear: Negative, done in 2016.  Will update for her today Her mammo is also due, she will take care of this asap  Patient Active Problem List   Diagnosis Date Noted  . Hypermobility syndrome 09/26/2017  . Mast cell activation syndrome ([place]) 09/26/2017  . POTS (postural orthostatic tachycardia syndrome) 08/10/2017  . Incomplete right bundle branch block 07/11/2017  . Fatigue 07/11/2017  . Low back pain 07/21/2016  . Right knee pain 06/05/2016  . Left shoulder pain 06/05/2016  . Hydrosalpinx 02/08/2016  . Hemorrhagic ovarian cyst 02/08/2016  . Right hip pain 02/03/2016  . MUSCLE SPASM, BACK 06/08/2009  . HYDROSALPINX 02/07/2008  . HEADACHE 10/28/2007  .  TACHYCARDIA 10/14/2007  . ANXIETY DISORDER 05/28/2007  . POLYARTHRITIS 05/09/2007  . UNSPECIFIED DISORDERS OF NERVOUS SYSTEM 04/22/2007  . ABDOMINAL PAIN RIGHT LOWER QUADRANT 04/08/2007  . PALPITATIONS, RECURRENT 11/07/2006  . PROBLEMS W/SMELL/TASTE 06/29/2006  . TOBACCO ABUSE 01/08/2006  . VISUAL IMPAIRMENT 01/08/2006  . DISTURBANCE, VISUAL NOS 01/08/2006  . FIBROCYSTIC BREAST DISEASE 01/08/2006  . [place] DISEASE, CERVICAL 01/08/2006  . VERTIGO 01/08/2006    Past Medical History:  Diagnosis Date  . Anxiety   . Arthritis   . Cervical intraepithelial neoplasia grade 3 2002   s/p LEEP Rx repeat pap negative  . Cervical radiculopathy   . Fallopian tube disorder    right fallopian tube removed  . Gallstones 09/2015    On CT  . Headache    Migraines  . Hemorrhagic ovarian cyst 02/08/2016  . Hydrosalpinx    [facility]  . Hydrosalpinx 02/08/2016  . Interstitial cystitis   . Neurological abnormality    Neg workup with MRI/MRA in 2007 WNL except decreased caliber MCA proximally, distal cavernous portion of left LCA which may represent true stenosis or technique on exam. Evaluated by Dr Linus Salmons.  . Palpitations   . Partial seizures (HCC)    questionable diag  . Pneumonia    walking pneumonia  . Polycystic ovary    multiple ovarian cysts removed  . S/P cervical discectomy    Dr Vertell Limber, anterior discectomy C5-C7  . S/P epidural steroid injection    Right hip  . Seizures ([place]) 2005   simple partial seizure disorder    Past Surgical History:  Procedure Laterality Date  . APPENDECTOMY  1986  . BUNIONECTOMY Left    bunion removal  . CERVICAL DISC SURGERY  2005   C5-C7  . COLONOSCOPY    . DENTAL SURGERY    . fallopian tue removed    . LAPAROSCOPIC BILATERAL SALPINGECTOMY Left 02/09/2016   Procedure: LAPAROSCOPIC LEFT  SALPINGECTOMY;  Surgeon: Janyth Contes, MD;  Location: [facility];  Service: Gynecology;  Laterality: Left;  . LAPAROSCOPIC LYSIS OF ADHESIONS  02/09/2016   Procedure: LAPAROSCOPIC LYSIS OF ADHESIONS;  Surgeon: Janyth Contes, MD;  Location: [facility];  Service: Gynecology;;  momentum to adenexa  . LAPAROSCOPIC OVARIAN CYSTECTOMY Right 02/09/2016   Procedure: LAPAROSCOPIC OVARIAN CYSTECTOMY;  Surgeon: Janyth Contes, MD;  Location: [facility];  Service: Gynecology;  Laterality: Right;  x2 to right ovary  . LEEP  1998  . OOPHORECTOMY Left 02/09/2016   Procedure: LEFT OOPHORECTOMY;  Surgeon: Janyth Contes, MD;  Location: [facility];  Service: Gynecology;  Laterality: Left;  . TONSILLECTOMY  2001  . UPPER GI ENDOSCOPY      Social History   Tobacco Use  . Smoking status: Former Smoker    Packs/day: 0.00    Years: 0.00    Pack years: 0.00    Types:  Cigarettes    Last attempt to quit: 11/02/2015    Years since quitting: 2.4  . Smokeless tobacco: Never Used  . Tobacco comment: currently smoking, began at beginning of June and plans to quit on June 14th. Has smoked off and on in the past.  Substance Use Topics  . Alcohol use: No    Alcohol/week: 1.0 - 2.0 standard drinks    Types: 1 - 2 Standard drinks or equivalent per week    Comment: occasionally 0-2 per day  . Drug use: No    Family History  Adopted: Yes  Problem Relation Age of Onset  . COPD Mother   .  Breast cancer Mother   . Other Mother        Teeth problems- all removed by age 33  . Rashes / Skin problems Sister   . Other Sister        Teeth problems- all removed by age 49  . Rashes / Skin problems Brother   . Asthma Brother   . Other Brother        Teeth problems- all removed by age 69    Allergies  Allergen Reactions  . Clindamycin/Lincomycin Itching and Other (See Comments)    Dizziness, trouble swallowing  . Codeine Itching and Nausea And Vomiting  . Cyclobenzaprine Other (See Comments)    Fast heartbeat, restless leg, nightmares  . Gabapentin Other (See Comments)    Headaches, visual disturbances.   . Nyquil Multi-Symptom [Pseudoeph-Doxylamine-Dm-Apap] Other (See Comments)    Heart racing, dizziness, weakness  . Uribel [Meth-Hyo-M Bl-Na Phos-Ph Sal] Other (See Comments)    Heart racing, dizziness, blurry vision.  . Xanax [Alprazolam]     Bruising  . Urelle Nausea And Vomiting    Medication list has been reviewed and updated.  Current Outpatient Medications on File Prior to Visit  Medication Sig Dispense Refill  . ibuprofen (ADVIL,MOTRIN) 100 MG/5ML suspension Take 200 mg by mouth every 4 (four) hours as needed.     No current facility-administered medications on file prior to visit.     Review of Systems:  As per HPI- otherwise negative. She denies any CP or SOB  She is here today with her fianc, who contributes to the history  Physical  Examination: Vitals:   04/04/18 1040  BP: 114/70  Pulse: 73  Resp: 16  SpO2: 98%   Vitals:   04/04/18 1040  Weight: 175 lb (79.4 kg)  Height: 5\' 5"  (1.651 m)   Body mass index is 29.12 kg/m. Ideal Body Weight: Weight in (lb) to have BMI = 25: 149.9  GEN: WDWN, NAD, Non-toxic, A & O x 3, overweight, looks well HEENT: Atraumatic, Normocephalic. Neck supple. No masses, No LAD. Ears and Nose: No external deformity. CV: RRR, No M/G/R. No JVD. No thrill. No extra heart sounds. PULM: CTA B, no wheezes, crackles, rhonchi. No retractions. No resp. distress. No accessory muscle use. EXTR: No c/c/e NEURO Normal gait.  PSYCH: Normally interactive. Conversant. Not depressed or anxious appearing.  Calm demeanor.  She indicates the left sternoclavicular joint as the source of her tenderness and pain.  It is still slightly tender to palpation, otherwise the neck muscles and neck are not tender.  Normal range of motion and strength of both arms No cervical lymphadenopathy noted  Assessment and Plan: Neck pain  Screening for cervical cancer - Plan: Cytology - PAP  Iron deficiency - Plan: CBC, Iron, Ferritin  Here today with concern of neck pain which she attributes to a muscle spasm or pulled muscle.  She has this problem quite frequently in various parts of her body. However, today her symptoms are actually much better.  She kept the appointment anyway just to discuss her frequent symptoms.  Advised that besides what she is already doing I am not aware of any techniques to help avoid frequent muscles.  I did encourage her to warm up gradually prior to physical activity. She is due for a Pap today, this result is pending. Also encouraged her to do a mammogram at her convenience She would like to check on her iron level today, which we can certainly do  Signed Jessica Copland,  MD Received her labs as below, message to patient She has been to see hematology in the past for her  leukocytosis  Results for orders placed or performed in visit on 04/04/18  CBC  Result Value Ref Range   WBC 13.7 (H) 4.0 - 10.5 K/uL   RBC 5.48 (H) 3.87 - 5.11 Mil/uL   Platelets 302.0 150.0 - 400.0 K/uL   Hemoglobin 15.4 (H) 12.0 - 15.0 g/dL   HCT 46.5 (H) 36.0 - 46.0 %   MCV 84.8 78.0 - 100.0 fl   MCHC 33.2 30.0 - 36.0 g/dL   RDW 14.8 11.5 - 15.5 %  Iron  Result Value Ref Range   Iron 142 42 - 145 ug/dL  Ferritin  Result Value Ref Range   Ferritin 47.2 10.0 - 291.0 ng/mL

## 2018-04-04 ENCOUNTER — Other Ambulatory Visit (HOSPITAL_COMMUNITY)
Admission: RE | Admit: 2018-04-04 | Discharge: 2018-04-04 | Disposition: A | Discharge status: Home / Self Care | Payer: BLUE CROSS/BLUE SHIELD | Source: Ambulatory Visit | Attending: Family Medicine | Admitting: Family Medicine

## 2018-04-04 ENCOUNTER — Ambulatory Visit (INDEPENDENT_AMBULATORY_CARE_PROVIDER_SITE_OTHER): Payer: BLUE CROSS/BLUE SHIELD | Admitting: Family Medicine

## 2018-04-04 ENCOUNTER — Encounter: Payer: Self-pay | Admitting: Family Medicine

## 2018-04-04 VITALS — BP 114/70 | HR 73 | Resp 16 | Ht 65.0 in | Wt 175.0 lb

## 2018-04-04 DIAGNOSIS — M542 Cervicalgia: Secondary | ICD-10-CM | POA: Diagnosis not present

## 2018-04-04 DIAGNOSIS — Z124 Encounter for screening for malignant neoplasm of cervix: Secondary | ICD-10-CM | POA: Insufficient documentation

## 2018-04-04 DIAGNOSIS — E611 Iron deficiency: Secondary | ICD-10-CM | POA: Diagnosis not present

## 2018-04-04 LAB — CBC
HCT: 46.5 % — ABNORMAL HIGH (ref 36.0–46.0)
Hemoglobin: 15.4 g/dL — ABNORMAL HIGH (ref 12.0–15.0)
MCHC: 33.2 g/dL (ref 30.0–36.0)
MCV: 84.8 fl (ref 78.0–100.0)
Platelets: 302 10*3/uL (ref 150.0–400.0)
RBC: 5.48 Mil/uL — ABNORMAL HIGH (ref 3.87–5.11)
RDW: 14.8 % (ref 11.5–15.5)
WBC: 13.7 10*3/uL — ABNORMAL HIGH (ref 4.0–10.5)

## 2018-04-04 LAB — IRON: Iron: 142 ug/dL (ref 42–145)

## 2018-04-04 LAB — FERRITIN: Ferritin: 47.2 ng/mL (ref 10.0–291.0)

## 2018-04-04 NOTE — Patient Instructions (Signed)
It was good to see you today, thank you for the update from the [facility]. Certainly try the muscle relaxer you have on hand, let me know if this is helpful for you. Otherwise I do not know how we can best avoid these frequent muscle pulls.  I would certainly recommend a gradual warm up prior to any significant physical activity. I will be in touch with your Pap, and also your blood work as soon as possible

## 2018-04-09 ENCOUNTER — Other Ambulatory Visit: Payer: Self-pay | Admitting: Family

## 2018-04-09 DIAGNOSIS — D72829 Elevated white blood cell count, unspecified: Secondary | ICD-10-CM

## 2018-04-09 LAB — CYTOLOGY - PAP
Diagnosis: NEGATIVE
HPV: NOT DETECTED

## 2018-04-10 ENCOUNTER — Other Ambulatory Visit: Payer: Self-pay

## 2018-04-10 ENCOUNTER — Inpatient Hospital Stay (HOSPITAL_BASED_OUTPATIENT_CLINIC_OR_DEPARTMENT_OTHER): Payer: BLUE CROSS/BLUE SHIELD | Admitting: Family

## 2018-04-10 ENCOUNTER — Inpatient Hospital Stay: Payer: BLUE CROSS/BLUE SHIELD | Attending: Family

## 2018-04-10 ENCOUNTER — Encounter: Payer: Self-pay | Admitting: Family

## 2018-04-10 VITALS — BP 129/79 | HR 94 | Temp 98.3°F | Resp 18 | Wt 175.0 lb

## 2018-04-10 DIAGNOSIS — D72828 Other elevated white blood cell count: Secondary | ICD-10-CM | POA: Diagnosis not present

## 2018-04-10 DIAGNOSIS — D582 Other hemoglobinopathies: Secondary | ICD-10-CM

## 2018-04-10 DIAGNOSIS — D45 Polycythemia vera: Secondary | ICD-10-CM

## 2018-04-10 DIAGNOSIS — D8940 Mast cell activation, unspecified: Secondary | ICD-10-CM | POA: Diagnosis not present

## 2018-04-10 DIAGNOSIS — D72829 Elevated white blood cell count, unspecified: Secondary | ICD-10-CM

## 2018-04-10 LAB — CMP (CANCER CENTER ONLY)
ALT: 20 U/L (ref 0–44)
AST: 16 U/L (ref 15–41)
Albumin: 4.4 g/dL (ref 3.5–5.0)
Alkaline Phosphatase: 54 U/L (ref 38–126)
Anion gap: 6 (ref 5–15)
BUN: 10 mg/dL (ref 6–20)
CO2: 29 mmol/L (ref 22–32)
Calcium: 9.4 mg/dL (ref 8.9–10.3)
Chloride: 102 mmol/L (ref 98–111)
Creatinine: 0.87 mg/dL (ref 0.44–1.00)
GFR, Est AFR Am: >60 mL/min (ref >60)
GFR, Estimated: >60 mL/min (ref >60)
Glucose, Bld: 111 mg/dL — ABNORMAL HIGH (ref 70–99)
Potassium: 4.2 mmol/L (ref 3.5–5.1)
Sodium: 137 mmol/L (ref 135–145)
Total Bilirubin: 0.4 mg/dL (ref 0.3–1.2)
Total Protein: 7.2 g/dL (ref 6.5–8.1)

## 2018-04-10 LAB — CBC WITH DIFFERENTIAL (CANCER CENTER ONLY)
Abs Immature Granulocytes: 0.61 10*3/uL — ABNORMAL HIGH (ref 0.00–0.07)
Basophils Absolute: 0.5 10*3/uL — ABNORMAL HIGH (ref 0.0–0.1)
Basophils Relative: 3 %
Eosinophils Absolute: 0.4 10*3/uL (ref 0.0–0.5)
Eosinophils Relative: 2 %
HCT: 45.1 % (ref 36.0–46.0)
Hemoglobin: 14.9 g/dL (ref 12.0–15.0)
Immature Granulocytes: 3 %
Lymphocytes Relative: 17 %
Lymphs Abs: 3.3 10*3/uL (ref 0.7–4.0)
MCH: 28.1 pg (ref 26.0–34.0)
MCHC: 33 g/dL (ref 30.0–36.0)
MCV: 85.1 fL (ref 80.0–100.0)
Monocytes Absolute: 0.8 10*3/uL (ref 0.1–1.0)
Monocytes Relative: 4 %
Neutro Abs: 14 10*3/uL — ABNORMAL HIGH (ref 1.7–7.7)
Neutrophils Relative %: 71 %
Platelet Count: 311 10*3/uL (ref 150–400)
RBC: 5.3 MIL/uL — ABNORMAL HIGH (ref 3.87–5.11)
RDW: 13.9 % (ref 11.5–15.5)
WBC Count: 19.6 10*3/uL — ABNORMAL HIGH (ref 4.0–10.5)
nRBC: 0 % (ref 0.0–0.2)

## 2018-04-10 LAB — SAVE SMEAR (SSMR)

## 2018-04-10 NOTE — Progress Notes (Signed)
Hematology and Oncology Follow Up Visit  Victoria Hall [id number] 12/24/1970 48 y.o. 04/10/2018   Principle Diagnosis:  Reactive leukocytosis   Current Therapy:   Observation   Interim History:  Victoria Hall is here today with her husband for c/o increased joint pain, dizziness, flushing redness of the face and chest, hot flashes, night sweats and intermittent blurry vision.  Her work up with several rheumatologists has been negative. She was also seen by the [facility] clinic and one of their geneticists. That work up was negative but mentioned polycythemia as being something for her to look into.  She was concerned about her elevated Hgb and iron counts but was on an iron supplement which she has since stopped. Her Hgb/Hct today are within normal limits.  She states that she has a strong first degree relative history of stroke.  She states that her cycle is now irregular.  WBC count is elevated at 19.6, platelets are 311.  She also states she was told she may have a mast cell activation problem.  No fever, chills, n/v, cough, dizziness, SOB, chest pain, palpitations, abdominal pain or changes in bowel or bladder habits.  No swelling, numbness or tingling in her extremities at this time.  No lymphadenopathy noted on exam.  She has a good appetite and is staying well hydrated. Her weight is stable.   ECOG Performance Status: 1 - Symptomatic but completely ambulatory  Medications:  Allergies as of 04/10/2018      Reactions   Clindamycin/lincomycin Itching, Other (See Comments)   Dizziness, trouble swallowing   Codeine Itching, Nausea And Vomiting   Cyclobenzaprine Other (See Comments)   Fast heartbeat, restless leg, nightmares   Gabapentin Other (See Comments)   Headaches, visual disturbances.    Nyquil Multi-symptom [pseudoeph-doxylamine-dm-apap] Other (See Comments)   Heart racing, dizziness, weakness   Uribel [meth-hyo-m Bl-na Phos-ph Sal] Other (See Comments)   Heart racing,  dizziness, blurry vision.   Xanax [alprazolam]    Bruising   Urelle Nausea And Vomiting      Medication List       Accurate as of April 10, 2018  3:07 PM. Always use your most recent med list.        ibuprofen 100 MG/5ML suspension Commonly known as:  ADVIL,MOTRIN Take 200 mg by mouth every 4 (four) hours as needed.       Allergies:  Allergies  Allergen Reactions  . Clindamycin/Lincomycin Itching and Other (See Comments)    Dizziness, trouble swallowing  . Codeine Itching and Nausea And Vomiting  . Cyclobenzaprine Other (See Comments)    Fast heartbeat, restless leg, nightmares  . Gabapentin Other (See Comments)    Headaches, visual disturbances.   . Nyquil Multi-Symptom [Pseudoeph-Doxylamine-Dm-Apap] Other (See Comments)    Heart racing, dizziness, weakness  . Uribel [Meth-Hyo-M Bl-Na Phos-Ph Sal] Other (See Comments)    Heart racing, dizziness, blurry vision.  . Xanax [Alprazolam]     Bruising  . Urelle Nausea And Vomiting    Past Medical History, Surgical history, Social history, and Family History were reviewed and updated.  Review of Systems: All other 10 point review of systems is negative.   Physical Exam:  weight is 175 lb (79.4 kg). Her oral temperature is 98.3 F (36.8 C). Her blood pressure is 129/79 and her pulse is 94. Her respiration is 18 and oxygen saturation is 100%.   Wt Readings from Last 3 Encounters:  04/10/18 175 lb (79.4 kg)  04/04/18 175 lb (79.4 kg)  09/24/17 175 lb (79.4 kg)    Ocular: Sclerae unicteric, pupils equal, round and reactive to light Ear-nose-throat: Oropharynx clear, dentition fair Lymphatic: No cervical, supraclavicular or axillary adenopathy Lungs no rales or rhonchi, good excursion bilaterally Heart regular rate and rhythm, no murmur appreciated Abd soft, nontender, positive bowel sounds, no liver or spleen tip palpated on exam, no fluid wave  MSK no focal spinal tenderness, no joint edema Neuro: non-focal,  well-oriented, appropriate affect Breasts: Deferred   Lab Results  Component Value Date   WBC 13.7 (H) 04/04/2018   HGB 15.4 (H) 04/04/2018   HCT 46.5 (H) 04/04/2018   MCV 84.8 04/04/2018   PLT 302.0 04/04/2018   Lab Results  Component Value Date   FERRITIN 47.2 04/04/2018   IRON 142 04/04/2018   Lab Results  Component Value Date   RBC 5.48 (H) 04/04/2018   No results found for: KPAFRELGTCHN, LAMBDASER, KAPLAMBRATIO No results found for: Kandis Cocking, IGMSERUM Lab Results  Component Value Date   TOTALPROTELP 7.4 01/14/2008   ALBUMINELP 3.8 09/14/2016   A1GS 3.9 01/14/2008   A2GS 9.2 01/14/2008   BETS 5.2 01/14/2008   GAMS 16.3 01/14/2008   MSPIKE Not Observed 09/14/2016     Chemistry      Component Value Date/Time   NA 144 03/26/2017 1101   K 4.4 03/26/2017 1101   CL 103 03/26/2017 1101   CO2 29 03/26/2017 1101   BUN 7 03/26/2017 1101   CREATININE 0.8 03/26/2017 1101      Component Value Date/Time   CALCIUM 9.5 03/26/2017 1101   ALKPHOS 64 03/26/2017 1101   AST 32 03/26/2017 1101   ALT 47 03/26/2017 1101   BILITOT 0.60 03/26/2017 1101       Impression and Plan: Victoria Hall is a pleasant 48 yo female we have seen in the past with reactive leukocytosis. She has back again with the above listed symptoms.  We have sent off a JAK 2 as well as a hemochromatosis DNA. Erythropoietin level was low end of normal.  WBC count remains elevated at 19.6. Hg/Hct and platelet count are within normal limits.  We will see what her lab results show and if they are negative we will discuss a bone marrow biopsy.  She may also benefit from gynecology work up due to irregular cycle and possibly perimenopausal state.  We will contact her for follow-up once we have al results.  She will contact our office with any questions or concerns.  We can certainly see her sooner if need be.   Laverna Peace, NP 1/8/20203:07 PM

## 2018-04-11 LAB — ERYTHROPOIETIN: Erythropoietin: 4.1 m[IU]/mL (ref 2.6–18.5)

## 2018-04-12 DIAGNOSIS — F4312 Post-traumatic stress disorder, chronic: Secondary | ICD-10-CM | POA: Diagnosis not present

## 2018-04-15 ENCOUNTER — Other Ambulatory Visit: Payer: BLUE CROSS/BLUE SHIELD

## 2018-04-15 ENCOUNTER — Ambulatory Visit: Payer: BLUE CROSS/BLUE SHIELD | Admitting: Family

## 2018-04-15 LAB — HEMOCHROMATOSIS DNA-PCR(C282Y,H63D)

## 2018-04-18 ENCOUNTER — Encounter: Payer: Self-pay | Admitting: Family

## 2018-04-18 ENCOUNTER — Telehealth: Payer: Self-pay | Admitting: Family

## 2018-04-18 ENCOUNTER — Other Ambulatory Visit: Payer: Self-pay | Admitting: Family

## 2018-04-18 DIAGNOSIS — Z148 Genetic carrier of other disease: Secondary | ICD-10-CM | POA: Insufficient documentation

## 2018-04-18 NOTE — Telephone Encounter (Signed)
Appointments scheduled patient notified per 1/16 sch msg

## 2018-04-18 NOTE — Telephone Encounter (Signed)
I spoke with Ms. Circle and went over her lab work with her in detail. She does have a single mutation of the H63D hemochromatosis DNA. We will schedule her for 1 phlebotomy next week and follow-up with repeat lab in 6 weeks per Dr. Marin Olp. She verbalized understanding and is in agreement with the plan.

## 2018-04-19 ENCOUNTER — Telehealth: Payer: Self-pay | Admitting: Family

## 2018-04-19 NOTE — Telephone Encounter (Signed)
Patient had sent a message with several questions. I was able to speak with her over the phone and all questions were addressed. For phlebotomy we want Iron < 100, Ferritin < 50 and iron saturation < 100%. She does have hereditary hemochromatosis and has stopped taking her iron supplement. She verbalized understanding and has no other questions at this time.

## 2018-04-23 ENCOUNTER — Encounter: Payer: Self-pay | Admitting: Allergy and Immunology

## 2018-04-23 ENCOUNTER — Telehealth: Payer: Self-pay | Admitting: Hematology & Oncology

## 2018-04-23 ENCOUNTER — Other Ambulatory Visit: Payer: Self-pay | Admitting: *Deleted

## 2018-04-23 ENCOUNTER — Inpatient Hospital Stay: Payer: BLUE CROSS/BLUE SHIELD

## 2018-04-23 ENCOUNTER — Ambulatory Visit: Payer: BLUE CROSS/BLUE SHIELD | Admitting: Allergy and Immunology

## 2018-04-23 VITALS — BP 122/70 | HR 103 | Resp 16 | Ht 65.5 in | Wt 176.6 lb

## 2018-04-23 DIAGNOSIS — D721 Eosinophilia, unspecified: Secondary | ICD-10-CM

## 2018-04-23 DIAGNOSIS — D72828 Other elevated white blood cell count: Secondary | ICD-10-CM | POA: Diagnosis not present

## 2018-04-23 DIAGNOSIS — Z148 Genetic carrier of other disease: Secondary | ICD-10-CM

## 2018-04-23 DIAGNOSIS — D72824 Basophilia: Secondary | ICD-10-CM

## 2018-04-23 DIAGNOSIS — D45 Polycythemia vera: Secondary | ICD-10-CM

## 2018-04-23 DIAGNOSIS — J3089 Other allergic rhinitis: Secondary | ICD-10-CM

## 2018-04-23 DIAGNOSIS — L500 Allergic urticaria: Secondary | ICD-10-CM

## 2018-04-23 LAB — CBC WITH DIFFERENTIAL (CANCER CENTER ONLY)
Abs Immature Granulocytes: 0.45 10*3/uL — ABNORMAL HIGH (ref 0.00–0.07)
Basophils Absolute: 0.4 10*3/uL — ABNORMAL HIGH (ref 0.0–0.1)
Basophils Relative: 2 %
Eosinophils Absolute: 0.3 10*3/uL (ref 0.0–0.5)
Eosinophils Relative: 2 %
HCT: 42.5 % (ref 36.0–46.0)
Hemoglobin: 14.3 g/dL (ref 12.0–15.0)
Immature Granulocytes: 3 %
Lymphocytes Relative: 18 %
Lymphs Abs: 2.9 10*3/uL (ref 0.7–4.0)
MCH: 28.8 pg (ref 26.0–34.0)
MCHC: 33.6 g/dL (ref 30.0–36.0)
MCV: 85.5 fL (ref 80.0–100.0)
Monocytes Absolute: 0.8 10*3/uL (ref 0.1–1.0)
Monocytes Relative: 5 %
Neutro Abs: 11.7 10*3/uL — ABNORMAL HIGH (ref 1.7–7.7)
Neutrophils Relative %: 70 %
Platelet Count: 293 10*3/uL (ref 150–400)
RBC: 4.97 MIL/uL (ref 3.87–5.11)
RDW: 13.8 % (ref 11.5–15.5)
WBC Count: 16.6 10*3/uL — ABNORMAL HIGH (ref 4.0–10.5)
nRBC: 0 % (ref 0.0–0.2)

## 2018-04-23 NOTE — Telephone Encounter (Signed)
Duplicate entered in error

## 2018-04-23 NOTE — Telephone Encounter (Signed)
Spoke with patient regarding appointment added to her schedule per 1/21 sch msg

## 2018-04-24 ENCOUNTER — Encounter: Payer: Self-pay | Admitting: Allergy and Immunology

## 2018-04-24 NOTE — Progress Notes (Signed)
Follow-up Note  Referring Provider: Darreld Mclean, MD Primary Provider: Darreld Mclean, MD Date of Office Visit: 04/23/2018  Subjective:   Victoria Hall (DOB: 03-Apr-1971) is a 48 y.o. female who [facility] 04[facility]2018 in re-evaluation of the following:  HPI: Victoria Hall presents to this clinic in evaluation of a multitude of different issues.    I initially saw her in this clinic 12 September 2016 at which point in time she had multiorgan complaints including rashes, flushing, urticaria, diarrhea, constipation, night sweats, heartburn and regurgitation, and issues tied up with allergic rhinoconjunctivitis.  She underwent extensive evaluation looking for systemic disease including mast cell activating syndrome and investigation of diseases associated with flushing disorders all of which were nondiagnostic.  She subsequently had evaluation at [facility] where she had just about every possible diagnostic test performed available in patients with immunologic or neurologic abnormalities.  Once again all diagnostic tests have been negative in evaluation of his systemic disease.  However, she has had persistent hematologic abnormalities including low-grade basophilia and neutrophilia and eosinophilia and apparently she was recently diagnosed as a heterozygote for hemochromatosis and is undergoing Jak 2 mutation analysis for possible polycythemia vera.  Presently she is visiting with Dr. Marin Olp in investigation of this issue.  She has an appointment to see him later this month.  Allergies as of 04/23/2018      Reactions   Clindamycin/lincomycin Itching, Other (See Comments)   Dizziness, trouble swallowing   Codeine Itching, Nausea And Vomiting   Cyclobenzaprine Other (See Comments)   Fast heartbeat, restless leg, nightmares   Gabapentin Other (See Comments)   Headaches, visual disturbances.    Nyquil Multi-symptom [pseudoeph-doxylamine-dm-apap] Other (See  Comments)   Heart racing, dizziness, weakness   Uribel [meth-hyo-m Bl-na Phos-ph Sal] Other (See Comments)   Heart racing, dizziness, blurry vision.   Xanax [alprazolam]    Bruising   Urelle Nausea And Vomiting      Medication List      ibuprofen 100 MG/5ML suspension Commonly known as:  ADVIL,MOTRIN Take 200 mg by mouth every 4 (four) hours as needed.       Past Medical History:  Diagnosis Date  . Anxiety   . Arthritis   . Cervical intraepithelial neoplasia grade 3 2002   s/p LEEP Rx repeat pap negative  . Cervical radiculopathy   . Fallopian tube disorder    right fallopian tube removed  . Gallstones 09/2015   On CT  . Headache    Migraines  . Hemorrhagic ovarian cyst 02/08/2016  . Hydrosalpinx    [facility]  . Hydrosalpinx 02/08/2016  . Interstitial cystitis   . Neurological abnormality    Neg workup with MRI/MRA in 2007 WNL except decreased caliber MCA proximally, distal cavernous portion of left LCA which may represent true stenosis or technique on exam. Evaluated by Dr Linus Salmons.  . Palpitations   . Partial seizures (HCC)    questionable diag  . Pneumonia    walking pneumonia  . Polycystic ovary    multiple ovarian cysts removed  . S/P cervical discectomy    Dr Vertell Limber, anterior discectomy C5-C7  . S/P epidural steroid injection    Right hip  . Seizures ([place]) 2005   simple partial seizure disorder    Past Surgical History:  Procedure Laterality Date  . APPENDECTOMY  1986  . BUNIONECTOMY Left    bunion removal  . CERVICAL DISC SURGERY  2005  C5-C7  . COLONOSCOPY    . DENTAL SURGERY    . fallopian tue removed    . LAPAROSCOPIC BILATERAL SALPINGECTOMY Left 02/09/2016   Procedure: LAPAROSCOPIC LEFT  SALPINGECTOMY;  Surgeon: Janyth Contes, MD;  Location: [facility];  Service: Gynecology;  Laterality: Left;  . LAPAROSCOPIC LYSIS OF ADHESIONS  02/09/2016   Procedure: LAPAROSCOPIC LYSIS OF ADHESIONS;  Surgeon: Janyth Contes, MD;   Location: [facility];  Service: Gynecology;;  momentum to adenexa  . LAPAROSCOPIC OVARIAN CYSTECTOMY Right 02/09/2016   Procedure: LAPAROSCOPIC OVARIAN CYSTECTOMY;  Surgeon: Janyth Contes, MD;  Location: [facility];  Service: Gynecology;  Laterality: Right;  x2 to right ovary  . LEEP  1998  . OOPHORECTOMY Left 02/09/2016   Procedure: LEFT OOPHORECTOMY;  Surgeon: Janyth Contes, MD;  Location: [facility];  Service: Gynecology;  Laterality: Left;  . TONSILLECTOMY  2001  . UPPER GI ENDOSCOPY      Review of systems negative except as noted in HPI / PMHx or noted below:  Review of Systems  Constitutional: Negative.   HENT: Negative.   Eyes: Negative.   Respiratory: Negative.   Cardiovascular: Negative.   Gastrointestinal: Negative.   Genitourinary: Negative.   Musculoskeletal: Negative.   Skin: Negative.   Neurological: Negative.   Endo/Heme/Allergies: Negative.   Psychiatric/Behavioral: Negative.      Objective:   Vitals:   04/23/18 1605  BP: 122/70  Pulse: (!) 103  Resp: 16  SpO2: 97%   Height: 5' 5.5" (166.4 cm)  Weight: 176 lb 9.6 oz (80.1 kg)   Physical Exam: deferred  Diagnostics:    Results of blood tests obtained 23 April 2018 identified WBC 16.6, basophils 400, eosinophils 300, immature granulocytes 450, lymphocytes 2900, neutrophils 11,700, hemoglobin 14.3, platelet 293.  Results of blood tests obtained a January 2020 identifies creatinine 0.83 mg/DL, AST 16 U/L, ALT 20 U/L.  Results of blood tests obtained a January 2020 including DNA mutation analysis with single mutation (H63D)   Assessment and Plan:   1. Other allergic rhinitis   2. Allergic urticaria   3. Basophilia   4. Eosinophilia   5. Hemochromatosis carrier    Based on all the information that has been collected over the course of the past several years it does appear as though Victoria Hall is having some type of reactive issue ongoing with in her bone marrow.  Whether this is secondary to  hemochromatosis or some type of myeloproliferative disease associated with a Jak 2 mutation is not entirely clear.  I informed Victoria Hall that she should follow-up with Dr. Marin Olp for further evaluation of these issues.  She may require a bone marrow investigation to nail down what is making her bone marrow so irritable and reactive.  I have not made a return appointment for her to be seen in this clinic.  Allena Katz, MD Allergy / Immunology [place]

## 2018-04-25 LAB — JAK2 (INCLUDING V617F AND EXON 12), MPL,& CALR-NEXT GEN SEQ

## 2018-04-29 ENCOUNTER — Encounter: Payer: Self-pay | Admitting: Hematology & Oncology

## 2018-04-29 DIAGNOSIS — F4312 Post-traumatic stress disorder, chronic: Secondary | ICD-10-CM | POA: Diagnosis not present

## 2018-05-06 ENCOUNTER — Inpatient Hospital Stay: Payer: BLUE CROSS/BLUE SHIELD | Attending: Family | Admitting: Hematology & Oncology

## 2018-05-06 ENCOUNTER — Other Ambulatory Visit: Payer: Self-pay

## 2018-05-06 VITALS — BP 114/76 | HR 88 | Temp 98.1°F | Resp 16 | Wt 172.0 lb

## 2018-05-06 DIAGNOSIS — D72828 Other elevated white blood cell count: Secondary | ICD-10-CM | POA: Insufficient documentation

## 2018-05-06 DIAGNOSIS — D8940 Mast cell activation, unspecified: Secondary | ICD-10-CM

## 2018-05-06 NOTE — Progress Notes (Signed)
Hematology and Oncology Follow Up Visit  Victoria Hall [id number] 06-Mar-1971 48 y.o. 05/06/2018   Principle Diagnosis:  Reactive leukocytosis  Hemochromatosis --heterozygous for H63D  Current Therapy:   Observation   Interim History:  Victoria Hall is here today with her husband for follow-up.  She actually went to the [facility].  She had extensive testing done up there.  She was seen by multiple specialists.  I am not sure exactly what they found.  Thankfully, I have read their reports in the epic system.  She is still having problems.  I think it is now time for a bone marrow biopsy.  She saw her immunologist in [place].  He was worried about her possibly having mast cell disease.  We sent off her JAK 2 testing.  This was negative.  Of note, her erythropoietin level is only 4.1.  As such, polycythemia is certainly a possibility.  We will set a bone marrow biopsy up for her.  She has this chronic leukocytosis.  There is some eosinophilia.  We know she does have hemochromatosis.  She is heterozygous for a minor gene mutation.  I do not think that iron overload really should be much of a problem for her.  She has had no rashes.  There is been no diarrhea.  Overall, her performance status is ECOG 1.  Medications:  Allergies as of 05/06/2018      Reactions   Clindamycin/lincomycin Itching, Other (See Comments)   Dizziness, trouble swallowing   Codeine Itching, Nausea And Vomiting   Cyclobenzaprine Other (See Comments)   Fast heartbeat, restless leg, nightmares   Gabapentin Other (See Comments)   Headaches, visual disturbances.    Nyquil Multi-symptom [pseudoeph-doxylamine-dm-apap] Other (See Comments)   Heart racing, dizziness, weakness   Uribel [meth-hyo-m Bl-na Phos-ph Sal] Other (See Comments)   Heart racing, dizziness, blurry vision.   Xanax [alprazolam]    Bruising   Urelle Nausea And Vomiting      Medication List    as of May 06, 2018  3:56 PM   You  have not been prescribed any medications.     Allergies:  Allergies  Allergen Reactions  . Clindamycin/Lincomycin Itching and Other (See Comments)    Dizziness, trouble swallowing  . Codeine Itching and Nausea And Vomiting  . Cyclobenzaprine Other (See Comments)    Fast heartbeat, restless leg, nightmares  . Gabapentin Other (See Comments)    Headaches, visual disturbances.   . Nyquil Multi-Symptom [Pseudoeph-Doxylamine-Dm-Apap] Other (See Comments)    Heart racing, dizziness, weakness  . Uribel [Meth-Hyo-M Bl-Na Phos-Ph Sal] Other (See Comments)    Heart racing, dizziness, blurry vision.  . Xanax [Alprazolam]     Bruising  . Urelle Nausea And Vomiting    Past Medical History, Surgical history, Social history, and Family History were reviewed and updated.  Review of Systems: Review of Systems  Constitutional: Positive for malaise/fatigue.  HENT: Negative.   Eyes: Negative.   Respiratory: Negative.   Cardiovascular: Negative.   Gastrointestinal: Positive for nausea.  Genitourinary: Negative.   Musculoskeletal: Positive for joint pain and myalgias.  Skin: Negative.   Neurological: Positive for focal weakness.  Psychiatric/Behavioral: Negative.      Physical Exam:  weight is 172 lb (78 kg). Her oral temperature is 98.1 F (36.7 C). Her blood pressure is 114/76 and her pulse is 88. Her respiration is 16 and oxygen saturation is 97%.   Wt Readings from Last 3 Encounters:  05/06/18 172 lb (78 kg)  04/23/18  176 lb 9.6 oz (80.1 kg)  04/10/18 175 lb (79.4 kg)    Physical Exam Vitals signs reviewed.  HENT:     Head: Normocephalic and atraumatic.  Eyes:     Pupils: Pupils are equal, round, and reactive to light.  Neck:     Musculoskeletal: Normal range of motion.  Cardiovascular:     Rate and Rhythm: Normal rate and regular rhythm.     Heart sounds: Normal heart sounds.  Pulmonary:     Effort: Pulmonary effort is normal.     Breath sounds: Normal breath sounds.    Abdominal:     General: Bowel sounds are normal.     Palpations: Abdomen is soft.  Musculoskeletal: Normal range of motion.        General: No tenderness or deformity.  Lymphadenopathy:     Cervical: No cervical adenopathy.  Skin:    General: Skin is warm and dry.     Findings: No erythema or rash.  Neurological:     Mental Status: She is alert and oriented to person, place, and time.  Psychiatric:        Behavior: Behavior normal.        Thought Content: Thought content normal.        Judgment: Judgment normal.       Lab Results  Component Value Date   WBC 16.6 (H) 04/23/2018   HGB 14.3 04/23/2018   HCT 42.5 04/23/2018   MCV 85.5 04/23/2018   PLT 293 04/23/2018   Lab Results  Component Value Date   FERRITIN 47.2 04/04/2018   IRON 142 04/04/2018   Lab Results  Component Value Date   RBC 4.97 04/23/2018   No results found for: KPAFRELGTCHN, LAMBDASER, KAPLAMBRATIO No results found for: Kandis Cocking, IGMSERUM Lab Results  Component Value Date   TOTALPROTELP 7.4 01/14/2008   ALBUMINELP 3.8 09/14/2016   A1GS 3.9 01/14/2008   A2GS 9.2 01/14/2008   BETS 5.2 01/14/2008   GAMS 16.3 01/14/2008   MSPIKE Not Observed 09/14/2016     Chemistry      Component Value Date/Time   NA 137 04/10/2018 1438   NA 144 03/26/2017 1101   K 4.2 04/10/2018 1438   K 4.4 03/26/2017 1101   CL 102 04/10/2018 1438   CL 103 03/26/2017 1101   CO2 29 04/10/2018 1438   CO2 29 03/26/2017 1101   BUN 10 04/10/2018 1438   BUN 7 03/26/2017 1101   CREATININE 0.87 04/10/2018 1438   CREATININE 0.8 03/26/2017 1101      Component Value Date/Time   CALCIUM 9.4 04/10/2018 1438   CALCIUM 9.5 03/26/2017 1101   ALKPHOS 54 04/10/2018 1438   ALKPHOS 64 03/26/2017 1101   AST 16 04/10/2018 1438   ALT 20 04/10/2018 1438   ALT 47 03/26/2017 1101   BILITOT 0.4 04/10/2018 1438       Impression and Plan: Victoria Hall is a pleasant 48 yo female we have seen in the past with reactive leukocytosis.     It is unclear as to what exactly is going on.  We clearly will have to get a bone marrow biopsy on her.  This is going to be the ultimate test.  Will make sure that we have cytogenetics.  We will have c-kit testing done.  I will make sure that we check for the [facility] chromosome.  I does want her to feel better.  She just is too young to be feeling like this having all these problems.  Going to the [facility] clinic is certainly a great idea and maybe they can still find something wrong.  We will plan to have her come back to see [place] in about 3 or 4 weeks.  By then, we should have all the results back from the bone marrow biopsy.  I spent about 45 minutes with she and her husband today.   Volanda Napoleon, MD 2/3/20203:56 PM

## 2018-05-13 ENCOUNTER — Encounter: Payer: Self-pay | Admitting: Hematology & Oncology

## 2018-05-13 DIAGNOSIS — F4312 Post-traumatic stress disorder, chronic: Secondary | ICD-10-CM | POA: Diagnosis not present

## 2018-05-14 ENCOUNTER — Other Ambulatory Visit: Payer: Self-pay | Admitting: Radiology

## 2018-05-16 ENCOUNTER — Ambulatory Visit (HOSPITAL_COMMUNITY)
Admission: RE | Admit: 2018-05-16 | Discharge: 2018-05-16 | Disposition: A | Discharge status: Home / Self Care | Payer: BLUE CROSS/BLUE SHIELD | Source: Ambulatory Visit | Attending: Hematology & Oncology | Admitting: Hematology & Oncology

## 2018-05-16 ENCOUNTER — Encounter (HOSPITAL_COMMUNITY): Payer: Self-pay

## 2018-05-16 ENCOUNTER — Other Ambulatory Visit: Payer: Self-pay

## 2018-05-16 DIAGNOSIS — D7589 Other specified diseases of blood and blood-forming organs: Secondary | ICD-10-CM | POA: Diagnosis not present

## 2018-05-16 DIAGNOSIS — D8940 Mast cell activation, unspecified: Secondary | ICD-10-CM | POA: Insufficient documentation

## 2018-05-16 DIAGNOSIS — D72829 Elevated white blood cell count, unspecified: Secondary | ICD-10-CM | POA: Diagnosis not present

## 2018-05-16 LAB — CBC WITH DIFFERENTIAL/PLATELET
Abs Immature Granulocytes: 0.62 10*3/uL — ABNORMAL HIGH (ref 0.00–0.07)
Basophils Absolute: 0.5 10*3/uL — ABNORMAL HIGH (ref 0.0–0.1)
Basophils Relative: 4 %
Eosinophils Absolute: 0.5 10*3/uL (ref 0.0–0.5)
Eosinophils Relative: 4 %
HCT: 45.9 % (ref 36.0–46.0)
Hemoglobin: 15 g/dL (ref 12.0–15.0)
Immature Granulocytes: 5 %
Lymphocytes Relative: 20 %
Lymphs Abs: 2.8 10*3/uL (ref 0.7–4.0)
MCH: 28.5 pg (ref 26.0–34.0)
MCHC: 32.7 g/dL (ref 30.0–36.0)
MCV: 87.3 fL (ref 80.0–100.0)
Monocytes Absolute: 0.8 10*3/uL (ref 0.1–1.0)
Monocytes Relative: 6 %
Neutro Abs: 8.4 10*3/uL — ABNORMAL HIGH (ref 1.7–7.7)
Neutrophils Relative %: 61 %
Platelets: 278 10*3/uL (ref 150–400)
RBC: 5.26 MIL/uL — ABNORMAL HIGH (ref 3.87–5.11)
RDW: 13.8 % (ref 11.5–15.5)
WBC: 13.6 10*3/uL — ABNORMAL HIGH (ref 4.0–10.5)
nRBC: 0 % (ref 0.0–0.2)

## 2018-05-16 LAB — PROTIME-INR
INR: 0.91
Prothrombin Time: 12.2 seconds (ref 11.4–15.2)

## 2018-05-16 MED ORDER — FLUMAZENIL 0.5 MG/5ML IV SOLN
INTRAVENOUS | Status: AC
  Filled 2018-05-16: qty 5

## 2018-05-16 MED ORDER — FENTANYL CITRATE (PF) 100 MCG/2ML IJ SOLN
INTRAMUSCULAR | Status: AC
  Filled 2018-05-16: qty 4

## 2018-05-16 MED ORDER — FENTANYL CITRATE (PF) 100 MCG/2ML IJ SOLN
INTRAMUSCULAR | Status: AC | PRN
  Administered 2018-05-16 (×2): 50 ug via INTRAVENOUS

## 2018-05-16 MED ORDER — MIDAZOLAM HCL 2 MG/2ML IJ SOLN
INTRAMUSCULAR | Status: AC
  Filled 2018-05-16: qty 4

## 2018-05-16 MED ORDER — SODIUM CHLORIDE 0.9 % IV SOLN
INTRAVENOUS | Status: DC
  Administered 2018-05-16: 08:00:00 via INTRAVENOUS

## 2018-05-16 MED ORDER — MIDAZOLAM HCL 2 MG/2ML IJ SOLN
INTRAMUSCULAR | Status: AC | PRN
  Administered 2018-05-16 (×3): 1 mg via INTRAVENOUS

## 2018-05-16 MED ORDER — NALOXONE HCL 0.4 MG/ML IJ SOLN
INTRAMUSCULAR | Status: AC
  Filled 2018-05-16: qty 1

## 2018-05-16 NOTE — Discharge Instructions (Signed)
Bone Marrow Aspiration and Bone Marrow Biopsy, Adult, Care After °This sheet gives you information about how to care for yourself after your procedure. Your health care provider may also give you more specific instructions. If you have problems or questions, contact your health care provider. °What can I expect after the procedure? °After the procedure, it is common to have: °· Mild pain and tenderness. °· Swelling. °· Bruising. °Follow these instructions at home: °Puncture site care ° °  ° °· Follow instructions from your health care provider about how to take care of the puncture site. Make sure you: °? Wash your hands with soap and water before you change your bandage (dressing). If soap and water are not available, use hand sanitizer. °? Change your dressing as told by your health care provider. °· Check your puncture site every day for signs of infection. Check for: °? More redness, swelling, or pain. °? More fluid or blood. °? Warmth. °? Pus or a bad smell. °General instructions °· Take over-the-counter and prescription medicines only as told by your health care provider. °· Do not take baths, swim, or use a hot tub until your health care provider approves. Ask if you can take a shower or have a sponge bath. °· Return to your normal activities as told by your health care provider. Ask your health care provider what activities are safe for you. °· Do not drive for 24 hours if you were given a medicine to help you relax (sedative) during your procedure. °· Keep all follow-up visits as told by your health care provider. This is important. °Contact a health care provider if: °· Your pain is not controlled with medicine. °Get help right away if: °· You have a fever. °· You have more redness, swelling, or pain around the puncture site. °· You have more fluid or blood coming from the puncture site. °· Your puncture site feels warm to the touch. °· You have pus or a bad smell coming from the puncture site. °These  symptoms may represent a serious problem that is an emergency. Do not wait to see if the symptoms will go away. Get medical help right away. Call your local emergency services (911 in the [place] Do not drive yourself to the hospital. °Summary °· After the procedure, it is common to have mild pain, tenderness, swelling, and bruising. °· Follow instructions from your health care provider about how to take care of the puncture site. °· Get help right away if you have any symptoms of infection or if you have more blood or fluid coming from the puncture site. °This information is not intended to replace advice given to you by your health care provider. Make sure you discuss any questions you have with your health care provider. °Document Released: 10/07/2004 Document Revised: 07/03/2017 Document Reviewed: 09/01/2015 °Elsevier Interactive Patient Education © 2019 [employer]vier Inc[employer] ° ° ° °Moderate Conscious Sedation, Adult, Care After °These instructions provide you with information about caring for yourself after your procedure. Your health care provider may also give you more specific instructions. Your treatment has been planned according to current medical practices, but problems sometimes occur. Call your health care provider if you have any problems or questions after your procedure. °What can I expect after the procedure? °After your procedure, it is common: °· To feel sleepy for several hours. °· To feel clumsy and have poor balance for several hours. °· To have poor judgment for several hours. °· To vomit if you eat too soon. °  Follow these instructions at home: °For at least 24 hours after the procedure: ° °· Do not: °? Participate in activities where you could fall or become injured. °? Drive. °? Use heavy machinery. °? Drink alcohol. °? Take sleeping pills or medicines that cause drowsiness. °? Make important decisions or sign legal documents. °? Take care of children on your own. °· Rest. °Eating and  drinking °· Follow the diet recommended by your health care provider. °· If you vomit: °? Drink water, juice, or soup when you can drink without vomiting. °? Make sure you have little or no nausea before eating solid foods. °General instructions °· Have a responsible adult stay with you until you are awake and alert. °· Take over-the-counter and prescription medicines only as told by your health care provider. °· If you smoke, do not smoke without supervision. °· Keep all follow-up visits as told by your health care provider. This is important. °Contact a health care provider if: °· You keep feeling nauseous or you keep vomiting. °· You feel light-headed. °· You develop a rash. °· You have a fever. °Get help right away if: °· You have trouble breathing. °This information is not intended to replace advice given to you by your health care provider. Make sure you discuss any questions you have with your health care provider. °Document Released: 01/08/2013 Document Revised: 08/23/2015 Document Reviewed: 07/10/2015 °Elsevier Interactive Patient Education © 2019 Elsevier Inc. ° °

## 2018-05-16 NOTE — Procedures (Signed)
Leukocytosis  S/p CT BM ASP AND CORE BX No comp Stable EBL min Path pending Full report in pacs

## 2018-05-16 NOTE — H&P (Signed)
Chief Complaint: Patient was seen in consultation today for reactive leukocytosis.  Referring Physician(s): Ennever,Peter R  Supervising Physician: Daryll Brod  Patient Status: [facility] - Out-pt  History of Present Illness: Victoria Hall is a 48 y.o. female with a past medical history of seizures, pneumonia, hemochromatosis, cholelithiasis, interstitial cystitis, hydrosalpinx, PCOS, arthritis, and anxiety. She has had a history of reactive leukocytosis of unknown etiology. Her reactive leukocytosis is followed by Dr. Marin Olp.  IR requested by Dr. Marin Olp for possible image-guided bone marrow biopsy/aspiration. Patient awake and alert laying in bed with no complaints at this time. Accompanied by husband at bedside. Denies fever, chills, chest pain, dyspnea, abdominal pain, dizziness, or headache.   Past Medical History:  Diagnosis Date  . Anxiety   . Arthritis   . Cervical intraepithelial neoplasia grade 3 2002   s/p LEEP Rx repeat pap negative  . Cervical radiculopathy   . Fallopian tube disorder    right fallopian tube removed  . Gallstones 09/2015   On CT  . Headache    Migraines  . Hemorrhagic ovarian cyst 02/08/2016  . Hydrosalpinx    [facility]  . Hydrosalpinx 02/08/2016  . Interstitial cystitis   . Neurological abnormality    Neg workup with MRI/MRA in 2007 WNL except decreased caliber MCA proximally, distal cavernous portion of left LCA which may represent true stenosis or technique on exam. Evaluated by Dr Linus Salmons.  . Palpitations   . Partial seizures (HCC)    questionable diag  . Pneumonia    walking pneumonia  . Polycystic ovary    multiple ovarian cysts removed  . S/P cervical discectomy    Dr Vertell Limber, anterior discectomy C5-C7  . S/P epidural steroid injection    Right hip  . Seizures ([place]) 2005   simple partial seizure disorder    Past Surgical History:  Procedure Laterality Date  . APPENDECTOMY  1986  . BUNIONECTOMY Left    bunion removal  . CERVICAL DISC SURGERY  2005   C5-C7  . COLONOSCOPY    . DENTAL SURGERY    . fallopian tue removed    . LAPAROSCOPIC BILATERAL SALPINGECTOMY Left 02/09/2016   Procedure: LAPAROSCOPIC LEFT  SALPINGECTOMY;  Surgeon: Janyth Contes, MD;  Location: [facility];  Service: Gynecology;  Laterality: Left;  . LAPAROSCOPIC LYSIS OF ADHESIONS  02/09/2016   Procedure: LAPAROSCOPIC LYSIS OF ADHESIONS;  Surgeon: Janyth Contes, MD;  Location: [facility];  Service: Gynecology;;  momentum to adenexa  . LAPAROSCOPIC OVARIAN CYSTECTOMY Right 02/09/2016   Procedure: LAPAROSCOPIC OVARIAN CYSTECTOMY;  Surgeon: Janyth Contes, MD;  Location: [facility];  Service: Gynecology;  Laterality: Right;  x2 to right ovary  . LEEP  1998  . OOPHORECTOMY Left 02/09/2016   Procedure: LEFT OOPHORECTOMY;  Surgeon: Janyth Contes, MD;  Location: [facility];  Service: Gynecology;  Laterality: Left;  . TONSILLECTOMY  2001  . UPPER GI ENDOSCOPY      Allergies: Clindamycin/lincomycin; Codeine; Cyclobenzaprine; Gabapentin; Nyquil multi-symptom [pseudoeph-doxylamine-dm-apap]; Uribel [meth-hyo-m bl-na phos-ph sal]; Xanax [alprazolam]; and Urelle  Medications: Prior to Admission medications   Not on File     Family History  Adopted: Yes  Problem Relation Age of Onset  . COPD Mother   . Breast cancer Mother   . Other Mother        Teeth problems- all removed by age 20  . Rashes / Skin problems Sister   . Other Sister        Teeth problems- all removed by age  73  . Rashes / Skin problems Brother   . Asthma Brother   . Other Brother        Teeth problems- all removed by age 31    Social History   Socioeconomic History  . Marital status: Divorced    Spouse name: Not on file  . Number of children: 0  . Years of education: Not on file  . Highest education level: Not on file  Occupational History  . 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    . Occupation: Pharmacist, hospital  Social Needs  . Financial resource strain: Not on file  . Food insecurity:    Worry: Not on file    Inability: Not on file  . Transportation needs:    Medical: Not on file    Non-medical: Not on file  Tobacco Use  . Smoking status: Former Smoker    Packs/day: 0.00    Years: 0.00    Pack years: 0.00    Types: Cigarettes    Last attempt to quit: 11/02/2015    Years since quitting: 2.5  . Smokeless tobacco: Never Used  . Tobacco comment: currently smoking, began at beginning of June and plans to quit on June 14th. Has smoked off and on in the past.  Substance and Sexual Activity  . Alcohol use: No    Alcohol/week: 1.0 - 2.0 standard drinks    Types: 1 - 2 Standard drinks or equivalent per week    Comment: occasionally 0-2 per day  . Drug use: No  . Sexual activity: Yes  Lifestyle  . Physical activity:    Days per week: Not on file    Minutes per session: Not on file  . Stress: Not on file  Relationships  . Social connections:    Talks on phone: Not on file    Gets together: Not on file    Attends religious service: Not on file    Active member of club or organization: Not on file    Attends meetings of clubs or organizations: Not on file    Relationship status: Not on file  Other Topics Concern  . Not on file  Social History Narrative  . Not on file     Review of Systems: A 12 point ROS discussed and pertinent positives are indicated in the HPI above.  All other systems are negative.  Review of Systems  Constitutional: Negative for chills and fever.  Respiratory: Negative for shortness of breath and wheezing.   Cardiovascular: Negative for chest pain and palpitations.  Gastrointestinal: Negative for abdominal pain.  Neurological: Negative for dizziness and headaches.  Psychiatric/Behavioral: Negative for behavioral problems and confusion.    Vital Signs: BP 99/70 (BP Location: Left Arm)   Pulse 62   Temp 98.1 F (36.7 C) (Oral)   Resp  18   LMP 04/20/2018 (Approximate)   SpO2 99%   Physical Exam Vitals signs and nursing note reviewed.  Constitutional:      General: She is not in acute distress.    Appearance: Normal appearance.  Cardiovascular:     Rate and Rhythm: Normal rate and regular rhythm.     Heart sounds: Normal heart sounds. No murmur.  Pulmonary:     Effort: Pulmonary effort is normal. No respiratory distress.     Breath sounds: Normal breath sounds. No wheezing.  Skin:    General: Skin is warm and dry.  Neurological:     Mental  Status: She is alert and oriented to person, place, and time.  Psychiatric:        Mood and Affect: Mood normal.        Behavior: Behavior normal.        Thought Content: Thought content normal.        Judgment: Judgment normal.      MD Evaluation Airway: WNL Heart: WNL Abdomen: WNL Chest/ Lungs: WNL ASA  Classification: 3 Mallampati/Airway Score: Two   Labs:  CBC: Recent Labs    04/04/18 1122 04/10/18 1438 04/23/18 1252 05/16/18 0730  WBC 13.7* 19.6* 16.6* 13.6*  HGB 15.4* 14.9 14.3 15.0  HCT 46.5* 45.1 42.5 45.9  PLT 302.0 311 293 278    COAGS: Recent Labs    05/16/18 0810  INR 0.91    BMP: Recent Labs    04/10/18 1438  NA 137  K 4.2  CL 102  CO2 29  GLUCOSE 111*  BUN 10  CALCIUM 9.4  CREATININE 0.87  GFRNONAA >60  GFRAA >60    LIVER FUNCTION TESTS: Recent Labs    04/10/18 1438  BILITOT 0.4  AST 16  ALT 20  ALKPHOS 54  PROT 7.2  ALBUMIN 4.4    Assessment and Plan:  Reactive leukocytosis. Plan for image-guided bone marrow biopsy/aspiration today with Dr. Annamaria Boots. Patient is NPO. Afebrile. She does not take blood thinners. INR 0.91 seconds this AM.  Risks and benefits discussed with the patient including, but not limited to bleeding, infection, damage to adjacent structures or low yield requiring additional tests. All of the patient's questions were answered, patient is agreeable to proceed. Consent signed and in  chart.   Thank you for this interesting consult.  I greatly enjoyed meeting ISBELLA ARLINE and look forward to participating in their care.  A copy of this report was sent to the requesting provider on this date.  Electronically Signed: Earley Abide, PA-C 05/16/2018, 8:46 AM   I spent a total of 40 Minutes in face to face in clinical consultation, greater than 50% of which was counseling/coordinating care for reactive leukocytosis.

## 2018-05-25 NOTE — Progress Notes (Signed)
[facility] [address], [place], [place] [address] [phone]  Date:  05/27/2018   Name:  Victoria Hall   DOB:  1970-11-19   MRN:  [id number]  PCP:  Darreld Mclean, MD    Chief Complaint: Chest Pain (tender to touch, swelling,  started off an on since sunday, when stretching something popped, radiating to back)   History of Present Illness:  Victoria Hall is a 48 y.o. very pleasant female patient who presents with the following:  Patient with history of pots, hypermobility syndrome, mast cell activation syndrome, and chronic fatigue. She is concerned that she might be "subluxing a rib" in her chest About 8 days ago she was stretching and felt a "pop" to the left of her sternum. She was in a lot of pain for 3-4 days.  Then this past week she was laying down and sneezed- it felt like something went back into place and her pain is much better  She currently feels okay in this regard  She is also recently been evaluated by hematology due to persistent leukocytosis She underwent a bone marrow biopsy as part of recent extensive evaluation ( including a visit to the [facility]) Patient reports that she just got the call today, that she does have leukemia- she has CML most likely They plan to have her in for an oncology visit next week to discuss further Ariadne has had nonspecific malaise for some time.  She is actually hopeful that if she does have CML, perhaps treating this will help her to feel better. She also wonders if there may be some connection with her mast cell disorder.  She is seeing her allergist and soon will discuss with him  She does not have any CP or SOB at this time   Flu shot: declines today  Married, former smoker  Patient Active Problem List   Diagnosis Date Noted  . Hemochromatosis 04/18/2018  . Hypermobility syndrome 09/26/2017  . Mast cell activation syndrome ([place]) 09/26/2017  . POTS  (postural orthostatic tachycardia syndrome) 08/10/2017  . Incomplete right bundle branch block 07/11/2017  . Fatigue 07/11/2017  . Low back pain 07/21/2016  . Right knee pain 06/05/2016  . Left shoulder pain 06/05/2016  . Hydrosalpinx 02/08/2016  . Hemorrhagic ovarian cyst 02/08/2016  . Right hip pain 02/03/2016  . MUSCLE SPASM, BACK 06/08/2009  . HYDROSALPINX 02/07/2008  . HEADACHE 10/28/2007  . TACHYCARDIA 10/14/2007  . ANXIETY DISORDER 05/28/2007  . POLYARTHRITIS 05/09/2007  . UNSPECIFIED DISORDERS OF NERVOUS SYSTEM 04/22/2007  . ABDOMINAL PAIN RIGHT LOWER QUADRANT 04/08/2007  . PALPITATIONS, RECURRENT 11/07/2006  . PROBLEMS W/SMELL/TASTE 06/29/2006  . TOBACCO ABUSE 01/08/2006  . VISUAL IMPAIRMENT 01/08/2006  . DISTURBANCE, VISUAL NOS 01/08/2006  . FIBROCYSTIC BREAST DISEASE 01/08/2006  . [place] DISEASE, CERVICAL 01/08/2006  . VERTIGO 01/08/2006    Past Medical History:  Diagnosis Date  . Anxiety   . Arthritis   . Cervical intraepithelial neoplasia grade 3 2002   s/p LEEP Rx repeat pap negative  . Cervical radiculopathy   . Fallopian tube disorder    right fallopian tube removed  . Gallstones 09/2015   On CT  . Headache    Migraines  . Hemorrhagic ovarian cyst 02/08/2016  . Hydrosalpinx    [facility]  . Hydrosalpinx 02/08/2016  . Interstitial cystitis   . Neurological abnormality    Neg workup with MRI/MRA in 2007 WNL  except decreased caliber MCA proximally, distal cavernous portion of left LCA which may represent true stenosis or technique on exam. Evaluated by Dr Linus Salmons.  . Palpitations   . Partial seizures (HCC)    questionable diag  . Pneumonia    walking pneumonia  . Polycystic ovary    multiple ovarian cysts removed  . S/P cervical discectomy    Dr Vertell Limber, anterior discectomy C5-C7  . S/P epidural steroid injection    Right hip  . Seizures ([place]) 2005   simple partial seizure disorder    Past Surgical History:  Procedure Laterality  Date  . APPENDECTOMY  1986  . BUNIONECTOMY Left    bunion removal  . CERVICAL DISC SURGERY  2005   C5-C7  . COLONOSCOPY    . DENTAL SURGERY    . fallopian tue removed    . LAPAROSCOPIC BILATERAL SALPINGECTOMY Left 02/09/2016   Procedure: LAPAROSCOPIC LEFT  SALPINGECTOMY;  Surgeon: Janyth Contes, MD;  Location: [facility];  Service: Gynecology;  Laterality: Left;  . LAPAROSCOPIC LYSIS OF ADHESIONS  02/09/2016   Procedure: LAPAROSCOPIC LYSIS OF ADHESIONS;  Surgeon: Janyth Contes, MD;  Location: [facility];  Service: Gynecology;;  momentum to adenexa  . LAPAROSCOPIC OVARIAN CYSTECTOMY Right 02/09/2016   Procedure: LAPAROSCOPIC OVARIAN CYSTECTOMY;  Surgeon: Janyth Contes, MD;  Location: [facility];  Service: Gynecology;  Laterality: Right;  x2 to right ovary  . LEEP  1998  . OOPHORECTOMY Left 02/09/2016   Procedure: LEFT OOPHORECTOMY;  Surgeon: Janyth Contes, MD;  Location: [facility];  Service: Gynecology;  Laterality: Left;  . TONSILLECTOMY  2001  . UPPER GI ENDOSCOPY      Social History   Tobacco Use  . Smoking status: Former Smoker    Packs/day: 0.00    Years: 0.00    Pack years: 0.00    Types: Cigarettes    Last attempt to quit: 11/02/2015    Years since quitting: 2.5  . Smokeless tobacco: Never Used  . Tobacco comment: currently smoking, began at beginning of June and plans to quit on June 14th. Has smoked off and on in the past.  Substance Use Topics  . Alcohol use: No    Alcohol/week: 1.0 - 2.0 standard drinks    Types: 1 - 2 Standard drinks or equivalent per week    Comment: occasionally 0-2 per day  . Drug use: No    Family History  Adopted: Yes  Problem Relation Age of Onset  . COPD Mother   . Breast cancer Mother   . Other Mother        Teeth problems- all removed by age 41  . Rashes / Skin problems Sister   . Other Sister        Teeth problems- all removed by age 25  . Rashes / Skin problems Brother   . Asthma Brother   . Other Brother         Teeth problems- all removed by age 33    Allergies  Allergen Reactions  . Clindamycin/Lincomycin Itching and Other (See Comments)    Dizziness, trouble swallowing  . Codeine Itching and Nausea And Vomiting  . Cyclobenzaprine Other (See Comments)    Fast heartbeat, restless leg, nightmares  . Gabapentin Other (See Comments)    Headaches, visual disturbances.   . Nyquil Multi-Symptom [Pseudoeph-Doxylamine-Dm-Apap] Other (See Comments)    Heart racing, dizziness, weakness  . Uribel [Meth-Hyo-M Bl-Na Phos-Ph Sal] Other (See Comments)    Heart racing, dizziness, blurry vision.  Duanne Moron [  Alprazolam]     Bruising  . Urelle Nausea And Vomiting    Medication list has been reviewed and updated.  No current outpatient medications on file prior to visit.   No current facility-administered medications on file prior to visit.     Review of Systems:  As per HPI- otherwise negative. No fever or chills, no current chest pain or shortness of breath  Physical Examination: Vitals:   05/27/18 1046  BP: 130/80  Pulse: 70  Resp: 16  Temp: 97.9 F (36.6 C)  SpO2: 98%   Vitals:   05/27/18 1046  Weight: 175 lb (79.4 kg)  Height: 5' 5.5" (1.664 m)   Body mass index is 28.68 kg/m. Ideal Body Weight: Weight in (lb) to have BMI = 25: 152.2  GEN: WDWN, NAD, Non-toxic, A & O x 3, overweight, looks well HEENT: Atraumatic, Normocephalic. Neck supple. No masses, No LAD.  Bilateral TM wnl, oropharynx normal.  PEERL,EOMI.   Ears and Nose: No external deformity. CV: RRR, No M/G/R. No JVD. No thrill. No extra heart sounds. PULM: CTA B, no wheezes, crackles, rhonchi. No retractions. No resp. distress. No accessory muscle use. She notes the left sternal border as her area of concern.  There is no redness or swelling, no skin change.  She is slightly tender at the left sternal border at this time, the patient states she is much improved EXTR: No c/c/e NEURO Normal gait.  PSYCH: Normally  interactive. Conversant. Not depressed or anxious appearing.  Calm demeanor.    Assessment and Plan: Sternal pain  CML (chronic myelocytic leukemia) (HCC)  Lynia is here today for follow-up visit.  She received a call today that she likely has CML.  She has follow-up scheduled with oncology already, and will keep me posted. She also has hypermobility, and feels like she may have partially subluxed a rib the last week.  However, the rib seem to go back into place when she sneezed.  She is now feeling much better in this regard.  I offered to obtain plain films or other imaging, at this point she declines, we will monitor this  Signed Lamar Blinks, MD

## 2018-05-27 ENCOUNTER — Encounter (HOSPITAL_COMMUNITY): Payer: Self-pay | Admitting: Hematology & Oncology

## 2018-05-27 ENCOUNTER — Ambulatory Visit (INDEPENDENT_AMBULATORY_CARE_PROVIDER_SITE_OTHER): Payer: BLUE CROSS/BLUE SHIELD | Admitting: Family Medicine

## 2018-05-27 ENCOUNTER — Encounter: Payer: Self-pay | Admitting: Family Medicine

## 2018-05-27 VITALS — BP 130/80 | HR 70 | Temp 97.9°F | Resp 16 | Ht 65.5 in | Wt 175.0 lb

## 2018-05-27 DIAGNOSIS — C9210 Chronic myeloid leukemia, BCR/ABL-positive, not having achieved remission: Secondary | ICD-10-CM | POA: Diagnosis not present

## 2018-05-27 DIAGNOSIS — R0789 Other chest pain: Secondary | ICD-10-CM | POA: Diagnosis not present

## 2018-05-27 NOTE — Patient Instructions (Signed)
Good to see you today!  I will watch for any updates on your chart Please let me know if I can be of any help to you

## 2018-05-28 DIAGNOSIS — F4312 Post-traumatic stress disorder, chronic: Secondary | ICD-10-CM | POA: Diagnosis not present

## 2018-05-29 ENCOUNTER — Encounter: Payer: Self-pay | Admitting: Family Medicine

## 2018-05-29 MED ORDER — DIAZEPAM 2 MG PO TABS: 1.0000 mg | ORAL_TABLET | Freq: Three times a day (TID) | ORAL | 0 refills | Status: DC | PRN

## 2018-05-29 NOTE — Addendum Note (Signed)
Addended by: Darreld Mclean on: 05/29/2018 06:38 PM   Modules accepted: Orders

## 2018-05-30 ENCOUNTER — Ambulatory Visit: Payer: BLUE CROSS/BLUE SHIELD | Admitting: Allergy and Immunology

## 2018-05-30 ENCOUNTER — Encounter: Payer: Self-pay | Admitting: Allergy and Immunology

## 2018-05-30 VITALS — BP 120/64 | HR 79 | Resp 18

## 2018-05-30 DIAGNOSIS — C9210 Chronic myeloid leukemia, BCR/ABL-positive, not having achieved remission: Secondary | ICD-10-CM

## 2018-05-30 NOTE — Progress Notes (Addendum)
[name] presents to this clinic in evaluation of recent diagnosis of CML.  I last saw her in this clinic on 23 April 2018 at which point time she had basophilia and eosinophilia and neutrophilia with immature granulocytes and a recent bone marrow biopsy identified CML.  She is here today to discuss further evaluation and treatment of her longstanding constitutional and systemic symptoms.    Results of FISH peripheral blood obtained 10 April 2018 identified no specific genetic mutation of KIT, JAK2, CALR, and ABL1  Results of a bone marrow biopsy obtained 16 May 2018 identified the following:  HCT 45.9 % Monocytes 6% MCV 87.3 fL Eosinophils 3% RDW 13.8 % Basophils 5% PLT 278 K/ul PERIPHERAL BLOOD SMEAR: The red blood cells display mild anisopoikilocytosis with mild polychromasia. The white blood cells are slightly increased in number, mostly with neutrophils, some of which display mild toxic granulation. This is associated with mild left shift with myelocytes. There is also slight basophilia. The platelets are normal in number. BONE MARROW ASPIRATE: Erythroid precursors: Orderly and progressive maturation. Granulocytic precursors: Relatively abundant with progressive maturation. Many of the maturing neutrophilic display mild toxic granulation. No increase in blastic cells is identified. Megakaryocytes: Abundant with scattered small hypolobated forms. Lymphocytes/plasma cells: Large aggregates not present. TOUCH PREPARATIONS: A mixture of cell types present. CLOT and BIOPSY: The sections show 70 to 80% cellularity with a mixture of myeloid cell types but with predominance of granulocytic precursors including abundant mature neutrophils. Expansile sheets of blastic cells are not identified. Megakaryocytes are also slightly increased in number with scattered small forms. Obvious mast cell clusters are not seen and significant lymphoid aggregates are not present. IRON STAIN: Iron stains are  performed on a bone marrow aspirate smear and section of clot. The controls stained appropriately. Storage Iron: Decreased. Ringed Sideroblasts: Absent.  Results of FISH Bone marrow performed 15 April 2018 identified BCR/ABL1 (9:22) translocation in all cells  Victoria Hall has CML and she needs to follow-up with Dr. Marin Olp about management of this issue.  Because she has a fair amount of systemic and constitutional symptoms that have been poorly explained in the past it appears as though she is symptomatic from her CML and she would require therapy.  Fortunately, she does not appear to be in accelerated phase of CML.  There is no evidence that she has an expanded mast cell population based on her bone marrow aspirate and no other genetic abnormality identified in [facility].  I have discussed this issue with Sula Soda and she will follow-up with Dr. Marin Olp next week.

## 2018-05-31 ENCOUNTER — Encounter: Payer: Self-pay | Admitting: Family Medicine

## 2018-05-31 ENCOUNTER — Encounter: Payer: Self-pay | Admitting: Hematology & Oncology

## 2018-05-31 DIAGNOSIS — C9210 Chronic myeloid leukemia, BCR/ABL-positive, not having achieved remission: Secondary | ICD-10-CM

## 2018-06-03 ENCOUNTER — Inpatient Hospital Stay: Payer: BLUE CROSS/BLUE SHIELD

## 2018-06-03 ENCOUNTER — Telehealth: Payer: Self-pay | Admitting: Pharmacist

## 2018-06-03 ENCOUNTER — Telehealth: Payer: Self-pay | Admitting: Pharmacy Technician

## 2018-06-03 ENCOUNTER — Inpatient Hospital Stay (HOSPITAL_BASED_OUTPATIENT_CLINIC_OR_DEPARTMENT_OTHER): Payer: BLUE CROSS/BLUE SHIELD | Admitting: Hematology & Oncology

## 2018-06-03 ENCOUNTER — Inpatient Hospital Stay: Payer: BLUE CROSS/BLUE SHIELD | Attending: Family

## 2018-06-03 ENCOUNTER — Other Ambulatory Visit: Payer: Self-pay

## 2018-06-03 ENCOUNTER — Encounter: Payer: Self-pay | Admitting: Hematology & Oncology

## 2018-06-03 VITALS — BP 116/72 | HR 70 | Temp 98.3°F | Resp 18 | Wt 176.0 lb

## 2018-06-03 DIAGNOSIS — C9210 Chronic myeloid leukemia, BCR/ABL-positive, not having achieved remission: Secondary | ICD-10-CM | POA: Insufficient documentation

## 2018-06-03 DIAGNOSIS — D8940 Mast cell activation, unspecified: Secondary | ICD-10-CM

## 2018-06-03 HISTORY — DX: Chronic myeloid leukemia, BCR/ABL-positive, not having achieved remission: C92.10

## 2018-06-03 LAB — CMP (CANCER CENTER ONLY)
ALT: 17 U/L (ref 0–44)
AST: 16 U/L (ref 15–41)
Albumin: 4.4 g/dL (ref 3.5–5.0)
Alkaline Phosphatase: 52 U/L (ref 38–126)
Anion gap: 6 (ref 5–15)
BUN: 11 mg/dL (ref 6–20)
CO2: 28 mmol/L (ref 22–32)
Calcium: 9.6 mg/dL (ref 8.9–10.3)
Chloride: 104 mmol/L (ref 98–111)
Creatinine: 0.85 mg/dL (ref 0.44–1.00)
GFR, Est AFR Am: >60 mL/min (ref >60)
GFR, Estimated: >60 mL/min (ref >60)
Glucose, Bld: 96 mg/dL (ref 70–99)
Potassium: 4.3 mmol/L (ref 3.5–5.1)
Sodium: 138 mmol/L (ref 135–145)
Total Bilirubin: 0.4 mg/dL (ref 0.3–1.2)
Total Protein: 7 g/dL (ref 6.5–8.1)

## 2018-06-03 LAB — CBC WITH DIFFERENTIAL (CANCER CENTER ONLY)
Abs Immature Granulocytes: 1.02 10*3/uL — ABNORMAL HIGH (ref 0.00–0.07)
Basophils Absolute: 0.6 10*3/uL — ABNORMAL HIGH (ref 0.0–0.1)
Basophils Relative: 3 %
Eosinophils Absolute: 0.5 10*3/uL (ref 0.0–0.5)
Eosinophils Relative: 3 %
HCT: 43 % (ref 36.0–46.0)
Hemoglobin: 14.1 g/dL (ref 12.0–15.0)
Immature Granulocytes: 5 %
Lymphocytes Relative: 15 %
Lymphs Abs: 2.8 10*3/uL (ref 0.7–4.0)
MCH: 28.3 pg (ref 26.0–34.0)
MCHC: 32.8 g/dL (ref 30.0–36.0)
MCV: 86.2 fL (ref 80.0–100.0)
Monocytes Absolute: 1 10*3/uL (ref 0.1–1.0)
Monocytes Relative: 5 %
Neutro Abs: 12.9 10*3/uL — ABNORMAL HIGH (ref 1.7–7.7)
Neutrophils Relative %: 69 %
Platelet Count: 288 10*3/uL (ref 150–400)
RBC: 4.99 MIL/uL (ref 3.87–5.11)
RDW: 13.6 % (ref 11.5–15.5)
WBC Count: 18.8 10*3/uL — ABNORMAL HIGH (ref 4.0–10.5)
nRBC: 0 % (ref 0.0–0.2)

## 2018-06-03 LAB — FERRITIN: Ferritin: 67 ng/mL (ref 11–307)

## 2018-06-03 LAB — IRON AND TIBC
Iron: 95 ug/dL (ref 41–142)
Saturation Ratios: 30 % (ref 21–57)
TIBC: 316 ug/dL (ref 236–444)
UIBC: 221 ug/dL (ref 120–384)

## 2018-06-03 LAB — LACTATE DEHYDROGENASE: LDH: 237 U/L — ABNORMAL HIGH (ref 98–192)

## 2018-06-03 LAB — SAVE SMEAR(SSMR), FOR PROVIDER SLIDE REVIEW

## 2018-06-03 MED ORDER — IMATINIB MESYLATE 400 MG PO TABS: 400.0000 mg | ORAL_TABLET | Freq: Every day | ORAL | 6 refills | Status: DC

## 2018-06-03 NOTE — Telephone Encounter (Signed)
Oral Oncology Pharmacist Encounter  Received new prescription for Gleevec (imatinib) for the treatment of newly diagnosed CML, planned duration until disease progression or unacceptable drug toxicity.  CBC/CMP from 06/03/2018 assessed, no relevant lab abnormalities. Prescription dose and frequency assessed.   Current medication list in Epic reviewed, one DDIs with imatinib identified: - Imatinib may cause increased effects of diazepam. No baseline dose adjustment needed. Monitor patient for hypotension, sedation, and somnolence.   Due to insurance restriction the medication could not be filled at [facility]. Prescription has been e-scribed to [facility]. Supportive information was faxed to [facility].  Oral Oncology Clinic will continue to follow for insurance authorization, copayment issues, initial counseling and start date.  Darl Pikes, PharmD, BCPS, [facility] Hematology/Oncology Clinical Pharmacist [facility] [phone]  06/03/2018 3:01 PM

## 2018-06-03 NOTE — Telephone Encounter (Signed)
Oral Oncology Patient Advocate Encounter  Received notification from [facility] that prior authorization for Gleevec (Imatinib) is required.  PA submitted on CoverMyMeds Key [id number] Status is pending  Oral Oncology Clinic will continue to follow.  [place] [facility] [place] Phone [phone] Fax [phone] 06/03/2018 3:14 PM

## 2018-06-03 NOTE — Progress Notes (Signed)
Hematology and Oncology Follow Up Visit  Victoria Hall [id number] Jun 21, 1970 48 y.o. 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 Googe is here today with her husband for follow-up.  Surprisingly, we did make a diagnosis on her.  She had her bone marrow biopsy done on 05/16/2018.  The pathology report ([id number]) showed chronic myeloid leukemia.  This is truly a revelation.  She was positive for the t(9:22) chromosome abnormality.  A measure of this totally will explain all of her issues.  However, I would definitely explains the leukocytosis and slight basophilia.  There is nothing else in the bone marrow that was noted.  There is no evidence of mast cell disease.  I we will go ahead and put her on [place].  I think that she has low risk CML.  I really do not think that we have to get her on 1 of the second-generation tyrosine kinase inhibitors.  I just do not think that her disease warrants that type of intervention.  Again, I am not sure if all of her symptoms can be explained by the [facility] but at least we have a diagnosis for her.  She does also have hemochromatosis.  This really has not been a problem for her.  Today, her iron studies show a ferritin of 67 with an iron saturation of 30%.  I really do not think that she needs to be phlebotomized.  She has had no fever.  She has had no change in bowel or bladder habits.  There has been no cough.  She has had no mouth sores.  There is been no headache.  Overall, her performance status is ECOG 1.  Medications:  Allergies as of 06/03/2018      Reactions   Clindamycin/lincomycin Itching, Other (See Comments)   Dizziness, trouble swallowing   Codeine Itching, Nausea And Vomiting   Cyclobenzaprine Other (See Comments)   Fast heartbeat,  restless leg, nightmares   Gabapentin Other (See Comments)   Headaches, visual disturbances.    Nyquil Multi-symptom [pseudoeph-doxylamine-dm-apap] Other (See Comments)   Heart racing, dizziness, weakness   Uribel [meth-hyo-m Bl-na Phos-ph Sal] Other (See Comments)   Heart racing, dizziness, blurry vision.   Xanax [alprazolam]    Bruising   Urelle Nausea And Vomiting      Medication List       Accurate as of June 03, 2018 11:12 AM. Always use your most recent med list.        diazepam 2 MG tablet Commonly known as:  VALIUM Take 0.5-1 tablets (1-2 mg total) by mouth every 8 (eight) hours as needed for anxiety.       Allergies:  Allergies  Allergen Reactions  . Clindamycin/Lincomycin Itching and Other (See Comments)    Dizziness, trouble swallowing  . Codeine Itching and Nausea And Vomiting  . Cyclobenzaprine Other (See Comments)    Fast heartbeat, restless leg, nightmares  . Gabapentin Other (See Comments)    Headaches, visual disturbances.   . Nyquil Multi-Symptom [Pseudoeph-Doxylamine-Dm-Apap] Other (See Comments)    Heart racing, dizziness, weakness  . Uribel [Meth-Hyo-M Bl-Na Phos-Ph Sal] Other (See Comments)    Heart racing, dizziness, blurry vision.  . Xanax [Alprazolam]     Bruising  . Urelle Nausea And Vomiting    Past  Medical History, Surgical history, Social history, and Family History were reviewed and updated.  Review of Systems: Review of Systems  Constitutional: Positive for malaise/fatigue.  HENT: Negative.   Eyes: Negative.   Respiratory: Negative.   Cardiovascular: Negative.   Gastrointestinal: Positive for nausea.  Genitourinary: Negative.   Musculoskeletal: Positive for joint pain and myalgias.  Skin: Negative.   Neurological: Positive for focal weakness.  Psychiatric/Behavioral: Negative.      Physical Exam:  weight is 176 lb (79.8 kg). Her oral temperature is 98.3 F (36.8 C). Her blood pressure is 116/72 and her pulse is 70. Her  respiration is 18 and oxygen saturation is 100%.   Wt Readings from Last 3 Encounters:  06/03/18 176 lb (79.8 kg)  05/27/18 175 lb (79.4 kg)  05/06/18 172 lb (78 kg)    Physical Exam Vitals signs reviewed.  HENT:     Head: Normocephalic and atraumatic.  Eyes:     Pupils: Pupils are equal, round, and reactive to light.  Neck:     Musculoskeletal: Normal range of motion.  Cardiovascular:     Rate and Rhythm: Normal rate and regular rhythm.     Heart sounds: Normal heart sounds.  Pulmonary:     Effort: Pulmonary effort is normal.     Breath sounds: Normal breath sounds.  Abdominal:     General: Bowel sounds are normal.     Palpations: Abdomen is soft.  Musculoskeletal: Normal range of motion.        General: No tenderness or deformity.  Lymphadenopathy:     Cervical: No cervical adenopathy.  Skin:    General: Skin is warm and dry.     Findings: No erythema or rash.  Neurological:     Mental Status: She is alert and oriented to person, place, and time.  Psychiatric:        Behavior: Behavior normal.        Thought Content: Thought content normal.        Judgment: Judgment normal.       Lab Results  Component Value Date   WBC 18.8 (H) 06/03/2018   HGB 14.1 06/03/2018   HCT 43.0 06/03/2018   MCV 86.2 06/03/2018   PLT 288 06/03/2018   Lab Results  Component Value Date   FERRITIN 47.2 04/04/2018   IRON 142 04/04/2018   Lab Results  Component Value Date   RBC 4.99 06/03/2018   No results found for: KPAFRELGTCHN, LAMBDASER, KAPLAMBRATIO No results found for: Kandis Cocking, [facility] Lab Results  Component Value Date   TOTALPROTELP 7.4 01/14/2008   ALBUMINELP 3.8 09/14/2016   A1GS 3.9 01/14/2008   A2GS 9.2 01/14/2008   BETS 5.2 01/14/2008   GAMS 16.3 01/14/2008   MSPIKE Not Observed 09/14/2016     Chemistry      Component Value Date/Time   NA 138 06/03/2018 0942   NA 144 03/26/2017 1101   K 4.3 06/03/2018 0942   K 4.4 03/26/2017 1101   CL 104  06/03/2018 0942   CL 103 03/26/2017 1101   CO2 28 06/03/2018 0942   CO2 29 03/26/2017 1101   BUN 11 06/03/2018 0942   BUN 7 03/26/2017 1101   CREATININE 0.85 06/03/2018 0942   CREATININE 0.8 03/26/2017 1101      Component Value Date/Time   CALCIUM 9.6 06/03/2018 0942   CALCIUM 9.5 03/26/2017 1101   ALKPHOS 52 06/03/2018 0942   ALKPHOS 64 03/26/2017 1101   AST 16 06/03/2018 0942   ALT 17  06/03/2018 0942   ALT 47 03/26/2017 1101   BILITOT 0.4 06/03/2018 0942       Impression and Plan: Ms. Ransom is a pleasant 48 yo female we have seen in the past with reactive leukocytosis.    At least, we have a firm diagnosis for her.  The CML is clearly a diagnosis that she has.  It will be interesting to see what her baseline BCR/ABL ratio will be.  I reviewed with her the side effects of Gleevec.  I told her about fluid retention.  I also explained to her about the possibility of fatigue, low thyroid activity, may be a rash, possible diarrhea.  Hopefully, she will not have any problems with the [place].  I do want to get an ultrasound of her abdomen.  I cannot feel her spleen on physical exam but still I think that it would be worthwhile getting a baseline ultrasound to assess for splenomegaly.  I spent about 45 minutes with she and her husband.  All the time was spent face-to-face.  I did look at the blood smear under the microscope.  She had some basophilia.  I did really do not see any leukemic blasts.  I really saw no promyelocytes or metamyelocytes.  She had no nucleated red blood cells.  I answered all their questions.  I spent time coordinating her future appointments.  I had to set up her ultrasound of the spleen.  We will send her prescription to our oral chemotherapy specialist and am sure she will be in contact with Ms. Jenean Lindau.    I spent about 45 minutes with she and her husband today.   Volanda Napoleon, MD 3/2/202011:12 AM

## 2018-06-04 ENCOUNTER — Encounter: Payer: Self-pay | Admitting: *Deleted

## 2018-06-04 NOTE — Telephone Encounter (Signed)
Oral Oncology Patient Advocate Encounter  Prior Authorization for Alpha has been approved.    PA# [id number] Effective dates: 06/04/2018 through 06/04/2019  Oral Oncology Clinic will continue to follow.   [place] [facility] [place] Phone [phone] Fax [phone] 06/04/2018 10:48 AM

## 2018-06-05 ENCOUNTER — Ambulatory Visit (HOSPITAL_BASED_OUTPATIENT_CLINIC_OR_DEPARTMENT_OTHER)
Admission: RE | Admit: 2018-06-05 | Discharge: 2018-06-05 | Disposition: A | Discharge status: Home / Self Care | Payer: BLUE CROSS/BLUE SHIELD | Source: Ambulatory Visit | Attending: Hematology & Oncology | Admitting: Hematology & Oncology

## 2018-06-05 DIAGNOSIS — C9210 Chronic myeloid leukemia, BCR/ABL-positive, not having achieved remission: Secondary | ICD-10-CM | POA: Diagnosis not present

## 2018-06-05 DIAGNOSIS — K8020 Calculus of gallbladder without cholecystitis without obstruction: Secondary | ICD-10-CM | POA: Diagnosis not present

## 2018-06-05 LAB — TRYPTASE: Tryptase: 4.9 ug/L (ref 2.2–13.2)

## 2018-06-06 ENCOUNTER — Telehealth: Payer: Self-pay | Admitting: *Deleted

## 2018-06-06 NOTE — Telephone Encounter (Addendum)
[facility]oke with [facility] to check status of rx.  Patients copay is $100.  With [employer] copay savings card, copay would only be reduced $25.

## 2018-06-06 NOTE — Telephone Encounter (Signed)
Per MD, Notified pt pf results. Discussed pt to call GI to inquire about gallstones as she has had this problem in the past and was seen for it by GI. Pt verbalized understanding. No other concerns.

## 2018-06-06 NOTE — Telephone Encounter (Signed)
-----   Message from Volanda Napoleon, MD sent at 06/06/2018 10:07 AM EST ----- Call - the spleen is NOT big!!!! You do have a lot of gallstones.  Liver is ok!!  Laurey Arrow

## 2018-06-07 ENCOUNTER — Other Ambulatory Visit (HOSPITAL_BASED_OUTPATIENT_CLINIC_OR_DEPARTMENT_OTHER): Payer: BLUE CROSS/BLUE SHIELD

## 2018-06-07 ENCOUNTER — Encounter (HOSPITAL_COMMUNITY): Payer: Self-pay | Admitting: Hematology & Oncology

## 2018-06-10 DIAGNOSIS — F4312 Post-traumatic stress disorder, chronic: Secondary | ICD-10-CM | POA: Diagnosis not present

## 2018-06-11 ENCOUNTER — Encounter: Payer: Self-pay | Admitting: Family Medicine

## 2018-06-11 DIAGNOSIS — C9210 Chronic myeloid leukemia, BCR/ABL-positive, not having achieved remission: Secondary | ICD-10-CM | POA: Diagnosis not present

## 2018-06-11 NOTE — Telephone Encounter (Signed)
Spoke with patient this morning.  [facility] told her that her copay would be $0-$5 with copay card and that there was an issue with processing.  I called the copay card and they could see that the pharmacy was not processing for [employer]EV[employer] brand Imatinib and that is why it was rejecting.  They also told me that the card stipulations changed back in December and the copay card would only take off $25 of the copay.  [facility] and asked them to check the [facility] and the representative I spoke with did not have access to that information and would send a message to the billing team to check it.  I called the patient back to let her know what was going on.  [facility] if they do not call her back by this afternoon.

## 2018-06-12 DIAGNOSIS — C9210 Chronic myeloid leukemia, BCR/ABL-positive, not having achieved remission: Secondary | ICD-10-CM | POA: Diagnosis not present

## 2018-06-12 LAB — BCR ABL1 FISH (GENPATH)

## 2018-06-13 ENCOUNTER — Telehealth: Payer: Self-pay | Admitting: Hematology

## 2018-06-13 NOTE — Telephone Encounter (Signed)
A new patient appt has been scheduled for the pt to see Dr. Irene Limbo on 3/23 at 11am. Victoria Hall is wanting to transfer her care from [place] because our facility is closer to home for her.

## 2018-06-13 NOTE — Telephone Encounter (Signed)
Medication copay is $75.  Patient received medication 3/11.

## 2018-06-21 NOTE — Progress Notes (Signed)
HEMATOLOGY/ONCOLOGY CONSULTATION NOTE  Date of Service: 06/24/2018  Patient Care Team: Copland, Gay Filler, MD as PCP - General (Family Medicine)  CHIEF COMPLAINTS/PURPOSE OF CONSULTATION:  Chronic Myeloid Leukemia  HISTORY OF PRESENTING ILLNESS:   Victoria Hall is a wonderful 48 y.o. female who has been referred to [place] by Dr. Silvestre Mesi for evaluation and management of CML. The pt was formerly under the care of my colleague Dr. Burney Gauze, and has transferred her care closer to home. The pt reports that she is doing well overall.  Prior to today's visit, the pt was recently diagnosed with CML after a 05/16/18 bone marrow biopsy revealed a translocation 9;22 and FISH study revealed BCR-ABL gene rearranagement in 85% of cells. The pt was prescribed '400mg'$  Imatinib with Dr. Marin Olp on 06/03/18.  The pt reports that she had intermittent blurry vision for the last 4 years. She notes that she had night sweats for the last 20 years. She notes that her WBC have been elevated for the last 4 years. She notes that she has had elevated Basophils since April 2018. She has been evaluated for mast cell activation, and has had normal tryptase levels, including most recently on 06/03/18.  She reports that she went to the [facility] in October 2019 for suspicions of dysautonomia. The pt notes that she has "exercise intolerance," for the last 4 years, which she describes as needing to "lie on the couch for 2 weeks," after taking a run.  She has had intermittent rashes and hives for the last 20 years, which do resolve on their own. She keeps a food and activity journal for the last 2 years, and denies awareness of any associations with her rash onset. She has seen dermatology and immunology regarding this. The pt has also seen a rheumatologist for evaluation.  The pt notes that her hands and feet frequently fall asleep, and has been evaluated by neurology with an NCV. She notes that she has cervical  radiculopathy which she relates to her 2005 C5-7 fusion. She endorses exhaustion which "comes on out of no where." She notes her energy levels change "from minute to minute."   The pt denies abdominal pains or mouth sores.   The pt endorse frequent muscle and joint cramping as well. She has tried using CBD oil which she found to be helpful, but notes that the cost became prohibitive.  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  Most recent lab results (06/03/18) of CBC w/diff and CMP is as follows: all values are WNL except for WBC at 18.8k, ANC at 12.9k Basophils abs at 600, Abs immature granulocytes at 1.02k 06/24/18 LDH at 237  On review of systems, pt reports intermittent sudden-onset fatigue, intermittent headaches, intermittent hives and rashes, muscle and joint cramping, and denies abdominal pains, mouth sores, leg swelling, and any other symptoms.   On PMHx the pt reports chronic cervical radiculopathy with fusion of C5-7 in 2005. 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 She endorses concern for chemical exposure related to her hobbies of painting and furniture restoration.   MEDICAL HISTORY:  Past Medical History:  Diagnosis Date  . Anxiety   . Arthritis   . Cervical intraepithelial neoplasia grade 3 2002   s/p LEEP Rx repeat pap negative  . Cervical radiculopathy   . CML (chronic myelocytic leukemia) ([place]) 06/03/2018  . Fallopian tube disorder  right fallopian tube removed  . Gallstones 09/2015   On CT  . Headache    Migraines  . Hemorrhagic ovarian cyst 02/08/2016  . Hydrosalpinx    [facility]  . Hydrosalpinx 02/08/2016  . Interstitial cystitis   . Neurological abnormality    Neg workup with MRI/MRA in 2007 WNL except decreased caliber MCA proximally, distal cavernous portion of left LCA which may represent true stenosis or technique on exam. Evaluated by Dr  Linus Salmons.  . Palpitations   . Partial seizures (HCC)    questionable diag  . Pneumonia    walking pneumonia  . Polycystic ovary    multiple ovarian cysts removed  . S/P cervical discectomy    Dr Vertell Limber, anterior discectomy C5-C7  . S/P epidural steroid injection    Right hip  . Seizures ([place]) 2005   simple partial seizure disorder    SURGICAL HISTORY: Past Surgical History:  Procedure Laterality Date  . APPENDECTOMY  1986  . BUNIONECTOMY Left    bunion removal  . CERVICAL DISC SURGERY  2005   C5-C7  . COLONOSCOPY    . DENTAL SURGERY    . fallopian tue removed    . LAPAROSCOPIC BILATERAL SALPINGECTOMY Left 02/09/2016   Procedure: LAPAROSCOPIC LEFT  SALPINGECTOMY;  Surgeon: Janyth Contes, MD;  Location: [facility];  Service: Gynecology;  Laterality: Left;  . LAPAROSCOPIC LYSIS OF ADHESIONS  02/09/2016   Procedure: LAPAROSCOPIC LYSIS OF ADHESIONS;  Surgeon: Janyth Contes, MD;  Location: [facility];  Service: Gynecology;;  momentum to adenexa  . LAPAROSCOPIC OVARIAN CYSTECTOMY Right 02/09/2016   Procedure: LAPAROSCOPIC OVARIAN CYSTECTOMY;  Surgeon: Janyth Contes, MD;  Location: [facility];  Service: Gynecology;  Laterality: Right;  x2 to right ovary  . LEEP  1998  . OOPHORECTOMY Left 02/09/2016   Procedure: LEFT OOPHORECTOMY;  Surgeon: Janyth Contes, MD;  Location: [facility];  Service: Gynecology;  Laterality: Left;  . TONSILLECTOMY  2001  . UPPER GI ENDOSCOPY      SOCIAL HISTORY: Social History   Socioeconomic History  . Marital status: Divorced    Spouse name: Not on file  . Number of children: 0  . Years of education: Not on file  . Highest education level: Not on file  Occupational History  . 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  . Occupation: Pharmacist, hospital  Social Needs  . Financial resource strain: Not on file  . Food insecurity:    Worry: Not on file    Inability: Not on file  . Transportation  needs:    Medical: Not on file    Non-medical: Not on file  Tobacco Use  . Smoking status: Former Smoker    Packs/day: 0.00    Years: 0.00    Pack years: 0.00    Types: Cigarettes    Last attempt to quit: 11/02/2015    Years since quitting: 2.6  . Smokeless tobacco: Never Used  . Tobacco comment: currently smoking, began at beginning of June and plans to quit on June 14th. Has smoked off and on in the past.  Substance and Sexual Activity  . Alcohol use: No    Alcohol/week: 1.0 - 2.0 standard drinks    Types: 1 - 2 Standard drinks or equivalent per week    Comment: occasionally 0-2 per day  . Drug use: No  . Sexual activity: Yes  Lifestyle  . Physical activity:    Days per week: Not on file  Minutes per session: Not on file  . Stress: Not on file  Relationships  . Social connections:    Talks on phone: Not on file    Gets together: Not on file    Attends religious service: Not on file    Active member of club or organization: Not on file    Attends meetings of clubs or organizations: Not on file    Relationship status: Not on file  . Intimate partner violence:    Fear of current or ex partner: Not on file    Emotionally abused: Not on file    Physically abused: Not on file    Forced sexual activity: Not on file  Other Topics Concern  . Not on file  Social History Narrative  . Not on file    FAMILY HISTORY: Family History  Adopted: Yes  Problem Relation Age of Onset  . COPD Mother   . Breast cancer Mother   . Other Mother        Teeth problems- all removed by age 41  . Rashes / Skin problems Sister   . Other Sister        Teeth problems- all removed by age 54  . Rashes / Skin problems Brother   . Asthma Brother   . Other Brother        Teeth problems- all removed by age 58    ALLERGIES:  is allergic to clindamycin/lincomycin; codeine; cyclobenzaprine; gabapentin; nyquil multi-symptom [pseudoeph-doxylamine-dm-apap]; uribel [meth-hyo-m bl-na phos-ph sal];  xanax [alprazolam]; and urelle.  MEDICATIONS:  Current Outpatient Medications  Medication Sig Dispense Refill  . diazepam (VALIUM) 2 MG tablet Take 0.5-1 tablets (1-2 mg total) by mouth every 8 (eight) hours as needed for anxiety. 30 tablet 0  . imatinib (GLEEVEC) 100 MG tablet Take 2 tablets (200 mg total) by mouth daily. Take with meals and large glass of water. 60 tablet 1   No current facility-administered medications for this visit.     REVIEW OF SYSTEMS:    10 Point review of Systems was done is negative except as noted above.  PHYSICAL EXAMINATION: ECOG PERFORMANCE STATUS: 1 - Symptomatic but completely ambulatory  . Vitals:   06/24/18 1109  BP: (!) 155/93  Pulse: (!) 111  Resp: 18  Temp: 98.8 F (37.1 C)  SpO2: 97%   Filed Weights   06/24/18 1109  Weight: 176 lb 9.6 oz (80.1 kg)   .Body mass index is 28.94 kg/m.  GENERAL:alert, in no acute distress and comfortable SKIN: no acute rashes, no significant lesions EYES: conjunctiva are pink and non-injected, sclera anicteric OROPHARYNX: MMM, no exudates, no oropharyngeal erythema or ulceration NECK: supple, no JVD LYMPH:  no palpable lymphadenopathy in the cervical, axillary or inguinal regions LUNGS: clear to auscultation b/l with normal respiratory effort HEART: regular rate & rhythm ABDOMEN:  normoactive bowel sounds , non tender, not distended. Extremity: no pedal edema PSYCH: alert & oriented x 3 with fluent speech NEURO: no focal motor/sensory deficits  LABORATORY DATA:  I have reviewed the data as listed  . CBC Latest Ref Rng & Units 06/03/2018 05/16/2018 04/23/2018  WBC 4.0 - 10.5 K/uL 18.8(H) 13.6(H) 16.6(H)  Hemoglobin 12.0 - 15.0 g/dL 14.1 15.0 14.3  Hematocrit 36.0 - 46.0 % 43.0 45.9 42.5  Platelets 150 - 400 K/uL 288 278 293   . CBC    Component Value Date/Time   WBC 18.8 (H) 06/03/2018 0942   WBC 13.6 (H) 05/16/2018 0730   RBC 4.99 06/03/2018 0942  HGB 14.1 06/03/2018 0942   HGB 15.1  03/26/2017 1101   HCT 43.0 06/03/2018 0942   HCT 44.0 03/26/2017 1101   PLT 288 06/03/2018 0942   PLT 330 03/26/2017 1101   MCV 86.2 06/03/2018 0942   MCV 84 03/26/2017 1101   MCH 28.3 06/03/2018 0942   MCHC 32.8 06/03/2018 0942   RDW 13.6 06/03/2018 0942   RDW 14.0 03/26/2017 1101   LYMPHSABS 2.8 06/03/2018 0942   LYMPHSABS 2.8 03/26/2017 1101   MONOABS 1.0 06/03/2018 0942   EOSABS 0.5 06/03/2018 0942   EOSABS 0.4 03/26/2017 1101   BASOSABS 0.6 (H) 06/03/2018 0942   BASOSABS 0.2 03/26/2017 1101     . CMP Latest Ref Rng & Units 06/03/2018 04/10/2018 03/26/2017  Glucose 70 - 99 mg/dL 96 111(H) 111  BUN 6 - 20 mg/dL '11 10 7  '$ Creatinine 0.44 - 1.00 mg/dL 0.85 0.87 0.8  Sodium 135 - 145 mmol/L 138 137 144  Potassium 3.5 - 5.1 mmol/L 4.3 4.2 4.4  Chloride 98 - 111 mmol/L 104 102 103  CO2 22 - 32 mmol/L '28 29 29  '$ Calcium 8.9 - 10.3 mg/dL 9.6 9.4 9.5  Total Protein 6.5 - 8.1 g/dL 7.0 7.2 6.9  Total Bilirubin 0.3 - 1.2 mg/dL 0.4 0.4 0.60  Alkaline Phos 38 - 126 U/L 52 54 64  AST 15 - 41 U/L 16 16 32  ALT 0 - 44 U/L 17 20 47   06/03/18 FISH:   05/16/18 Cytogenetics:   04/10/18 Hemochromatosis Panel:    RADIOGRAPHIC STUDIES: I have personally reviewed the radiological images as listed and agreed with the findings in the report. US Abdomen Complete  Result Date: 06/06/2018 CLINICAL DATA:  CML and hemochromatosis EXAM: ABDOMEN ULTRASOUND COMPLETE COMPARISON:  02/26/2017 FINDINGS: Gallbladder: Gallbladder is partially distended with multiple gallstones within. No gallbladder wall thickening or pericholecystic fluid is noted. Common bile duct: Diameter: 2.5 mm. Liver: No focal lesion identified. Within normal limits in parenchymal echogenicity. Portal vein is patent on color Doppler imaging with normal direction of blood flow towards the liver. IVC: No abnormality visualized. Pancreas: Visualized portion unremarkable. Spleen: Size and appearance within normal limits. Right Kidney: Length:  10.4 cm. Echogenicity within normal limits. No mass or hydronephrosis visualized. Left Kidney: Length: 9.8 cm. Echogenicity within normal limits. No mass or hydronephrosis visualized. Abdominal aorta: No aneurysm visualized. Other findings: None. IMPRESSION: Cholelithiasis without complicating factors. No other focal abnormality is seen. Electronically Signed   By: Inez Catalina M.D.   On: 06/06/2018 08:22    ASSESSMENT & PLAN:  48 y.o. female with  1. CML- Chronic phase 05/16/18 BM Cytogenetics revealed translocation 9;22. FISH report revealed BCR-ABL gene rearrangement present in 86.5% of cells 06/05/18 US Abdomen revealed normal spleen size  2. 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 Chemistries are normal. HGB normal and PLT normal. LDH borderline high at 237. -Discussed the 06/03/18 FISH which revealed BCR-ABL gene rearrangement present in 85.67% of cells, with some hypercellularity around 80% -Discussed that the patient's CML appears to be slow moving based on 86% mutated cells, and minimal blood count abnormalities -Discussed the treatment and dosing options with the pt. The pt prefers not to begin full dose treatment at this time, citing concerns for immunosuppression in the current Covid19 climate, which is not unreasonable. -Pt is leaning towards starting at low dose  $'100mg'T$  Imatinib, with steady dose escalation to normal dose per displayed tolerance. However, we will discuss treatment options and dosing further at our next visit in one month, which the pt prefers. -Discussed that I do not believe that many of her unique symptoms are attributable to her new CML diagnosis, and discussed that treating her CML will likely not lead to complete symptomatic relief -Recommend continuing follow up with PCP for evaluation of her symptoms -Will  order baseline EKG -Will see the pt back in 1 month with labs   RTC with Dr Irene Limbo with labs in 1 months   All of the patients questions were answered with apparent satisfaction. The patient knows to call the clinic with any problems, questions or concerns.  The total time spent in the appt was 60 minutes and more than 50% was on counseling and direct patient cares.    Sullivan Lone MD MS AAHIVMS [facility] Hematology/Oncology Physician [facility]  (Office):       [phone] (Work cell):  [phone] (Fax):           [phone]  06/24/2018 12:21 PM  I, Baldwin Jamaica, am acting as a scribe for Dr. Sullivan Lone.   .I have reviewed the above documentation for accuracy and completeness, and I agree with the above. Brunetta Genera MD

## 2018-06-24 ENCOUNTER — Telehealth: Payer: Self-pay | Admitting: Pharmacist

## 2018-06-24 ENCOUNTER — Other Ambulatory Visit: Payer: Self-pay

## 2018-06-24 ENCOUNTER — Inpatient Hospital Stay (HOSPITAL_BASED_OUTPATIENT_CLINIC_OR_DEPARTMENT_OTHER): Payer: BLUE CROSS/BLUE SHIELD | Admitting: Hematology

## 2018-06-24 ENCOUNTER — Telehealth: Payer: Self-pay | Admitting: Hematology

## 2018-06-24 VITALS — BP 155/93 | HR 111 | Temp 98.8°F | Resp 18 | Ht 65.5 in | Wt 176.6 lb

## 2018-06-24 DIAGNOSIS — C9210 Chronic myeloid leukemia, BCR/ABL-positive, not having achieved remission: Secondary | ICD-10-CM

## 2018-06-24 MED ORDER — IMATINIB MESYLATE 100 MG PO TABS: 200.0000 mg | ORAL_TABLET | Freq: Every day | ORAL | 1 refills | Status: DC

## 2018-06-24 NOTE — Telephone Encounter (Signed)
Oral Oncology Pharmacist Encounter  Received new prescription for Gleevec (imatinib) for the treatment of newly diagnosed CML, planned duration until disease progression or unacceptable drug toxicity.  Patient originally prescribed Gleevec 400mg  once daily by Dr. Marin Olp. Patient has not started on this medication and has transferred care to Dr. Irene Limbo at [facility]. After office visit today, patient will initiate Gleevec at 200mg  once daily and up-titrate as tolerated.  Labs from 06/03/18 assessed, OK for treatment initiation.  Current medication list in Epic reviewed, moderate DDI with Gleevec and diazepam identified:  Imatinib may cause increased effects of diazepam. No baseline dose adjustment needed. Monitor patient for hypotension, sedation, and somnolence.  Prescription has been e-scribed to [facility] ([place]) for benefits analysis and approval per insurance requirement.  Oral Oncology Clinic will continue to follow for insurance authorization, copayment issues, initial counseling and start date.  Johny Drilling, PharmD, BCPS, BCOP  06/24/2018 3:02 PM Oral Oncology Clinic [phone]

## 2018-06-24 NOTE — Telephone Encounter (Signed)
Scheduled appt per 3/23 los. ° °Printed calendar and avs. °

## 2018-06-25 DIAGNOSIS — F4312 Post-traumatic stress disorder, chronic: Secondary | ICD-10-CM | POA: Diagnosis not present

## 2018-06-25 NOTE — Telephone Encounter (Signed)
Oral Chemotherapy Pharmacist Encounter   Attempted to reach patient to provide update and offer for initial counseling on oral medication: [place].  No answer. Left VM for patient to call back.   Johny Drilling, PharmD, BCPS, BCOP  06/25/2018   3:23 PM Oral Oncology Clinic [phone]

## 2018-06-25 NOTE — Telephone Encounter (Signed)
Oral Oncology Patient Advocate Encounter  I followed up with [facility] and said [facility] and [facility]ay will be $75. This is also what her copay has been for the previous script that was filled the beginning of March.   [facility] will be [facility] out to the patient today or tomorrow to schedule delivery.  [place] Patient [place] Phone [phone] Fax [phone] 06/25/2018   3:07 PM

## 2018-06-26 NOTE — Telephone Encounter (Signed)
Oral Chemotherapy Pharmacist Encounter   I spoke with patient for overview of Gleevec (imatinib) treatment of newly diagnosed CML, planned durationuntil disease progression or unacceptable drug toxicity.   Counseled patient on administration, dosing, side effects, monitoring, drug-food interactions, safe handling, storage, and disposal.  Patient will take Gleevec 100mg  tablets, 1 tablet (100mg ) by mouth once daily with a meal and a large glass of water.  Gleevec 100mg  once daily will be continued until patient is seen by Dr. Irene Limbo at office visit scheduled for 07/25/18 where up-titration of Gleevec will be considered at that time. Patient understands her [facility] prescription states to take 200mg  once daily, however, she will initiate treatment on 100mg  once daily.  Patient knows food may decrease stomach irritation and to maintain hydration while on treatment with Gleevec.  Patient counseled to avoid grapefruit and grapefruit juice.  Gleevec start date: pending medication acquisition, will start by 07/01/18  Adverse effects include but are not limited to: nausea, vomiting, diarrhea, fatigue, muscle cramps, lower extremity edema, rash, decreased blood counts, GI bleeding, and cardiac dysfunction.  Patient [facility] and knows to take it if nausea develops.   Patient will obtain anti diarrheal and alert the office of 4 or more loose stools above baseline.  Reviewed with patient importance of keeping a medication schedule and plan for any missed doses.  Ms. Lueck voiced understanding and appreciation.   All questions answered. Medication reconciliation performed and medication/allergy list updated.  Insurance authorization previously obtained. Patient has imatinib 400mg  tablets on hand and will hold on to that bottle for when her dose it titrated up to normal dosing. It remains in sealed bottle from manufacturer with  expiration date of 10/21/2019.  New prescription for Gleevec is being filled at [facility] per insurance requirement. Patient informed copayment is $75 per fill and the pharmacy will be reaching out to patient in the next 24-48hrs to schedule delivery.  Patient knows to call the office with questions or concerns. Oral Oncology Clinic will continue to follow.  Johny Drilling, PharmD, BCPS, BCOP  06/26/2018   11:14 AM Oral Oncology Clinic [phone]

## 2018-06-27 ENCOUNTER — Other Ambulatory Visit: Payer: Self-pay | Admitting: Hematology

## 2018-06-27 MED ORDER — HYDROXYZINE HCL 25 MG PO TABS: 12.5000 mg | ORAL_TABLET | Freq: Three times a day (TID) | ORAL | 0 refills | Status: DC | PRN

## 2018-06-28 ENCOUNTER — Other Ambulatory Visit: Payer: Self-pay | Admitting: *Deleted

## 2018-06-28 DIAGNOSIS — C9210 Chronic myeloid leukemia, BCR/ABL-positive, not having achieved remission: Secondary | ICD-10-CM

## 2018-06-28 MED ORDER — IMATINIB MESYLATE 100 MG PO TABS: 200.0000 mg | ORAL_TABLET | Freq: Every day | ORAL | 1 refills | Status: DC

## 2018-06-28 NOTE — Telephone Encounter (Signed)
[place] [facility] [place]. [facility] [place].

## 2018-07-01 DIAGNOSIS — C9210 Chronic myeloid leukemia, BCR/ABL-positive, not having achieved remission: Secondary | ICD-10-CM | POA: Diagnosis not present

## 2018-07-02 ENCOUNTER — Ambulatory Visit: Payer: BLUE CROSS/BLUE SHIELD | Admitting: Hematology & Oncology

## 2018-07-02 ENCOUNTER — Other Ambulatory Visit: Payer: BLUE CROSS/BLUE SHIELD

## 2018-07-03 NOTE — Telephone Encounter (Signed)
Oral Oncology Patient Advocate Encounter  I spoke with the patient and she has scheduled her delivery of [place] 100mg  for 07/05/18.  The patient will call me if she has any other issues getting the [place].  [place] Patient [place] Phone [phone] Fax [phone] 07/03/2018   10:40 AM

## 2018-07-24 NOTE — Progress Notes (Signed)
HEMATOLOGY/ONCOLOGY CONSULTATION NOTE  Date of Service: 07/25/2018  Patient Care Team: Copland, Gay Filler, MD as PCP - General (Family Medicine)  CHIEF COMPLAINTS/PURPOSE OF CONSULTATION:  Chronic Myeloid Leukemia  HISTORY OF PRESENTING ILLNESS:   Victoria Hall is a wonderful 48 y.o. female who has been referred to [place] by Dr. Silvestre Mesi for evaluation and management of CML. The pt was formerly under the care of my colleague Dr. Burney Gauze, and has transferred her care closer to home. The pt reports that she is doing well overall.  Prior to today's visit, the pt was recently diagnosed with CML after a 05/16/18 bone marrow biopsy revealed a translocation 9;22 and FISH study revealed BCR-ABL gene rearranagement in 85% of cells. The pt was prescribed '400mg'$  Imatinib with Dr. Marin Olp on 06/03/18.  The pt reports that she had intermittent blurry vision for the last 4 years. She notes that she had night sweats for the last 20 years. She notes that her WBC have been elevated for the last 4 years. She notes that she has had elevated Basophils since April 2018. She has been evaluated for mast cell activation, and has had normal tryptase levels, including most recently on 06/03/18.  She reports that she went to the [facility] in October 2019 for suspicions of dysautonomia. The pt notes that she has "exercise intolerance," for the last 4 years, which she describes as needing to "lie on the couch for 2 weeks," after taking a run.  She has had intermittent rashes and hives for the last 20 years, which do resolve on their own. She keeps a food and activity journal for the last 2 years, and denies awareness of any associations with her rash onset. She has seen dermatology and immunology regarding this. The pt has also seen a rheumatologist for evaluation.  The pt notes that her hands and feet frequently fall asleep, and has been evaluated by neurology with an NCV. She notes that she has cervical  radiculopathy which she relates to her 2005 C5-7 fusion. She endorses exhaustion which "comes on out of no where." She notes her energy levels change "from minute to minute."   The pt denies abdominal pains or mouth sores.   The pt endorse frequent muscle and joint cramping as well. She has tried using CBD oil which she found to be helpful, but notes that the cost became prohibitive.  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  Most recent lab results (06/03/18) of CBC w/diff and CMP is as follows: all values are WNL except for WBC at 18.8k, ANC at 12.9k Basophils abs at 600, Abs immature granulocytes at 1.02k 06/24/18 LDH at 237  On review of systems, pt reports intermittent sudden-onset fatigue, intermittent headaches, intermittent hives and rashes, muscle and joint cramping, and denies abdominal pains, mouth sores, leg swelling, and any other symptoms.   On PMHx the pt reports chronic cervical radiculopathy with fusion of C5-7 in 2005. 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 She endorses concern for chemical exposure related to her hobbies of painting and furniture restoration.  Interval History:   Victoria Hall returns today for management and evaluation of her CML. The patient's last visit with [place] was on 06/24/18. The pt reports that she is doing well overall.  The pt reports that she has considered her options further in the interim and would like to begin '100mg'$   Imatinib tomorrow. She denies developing any new concerns in the interim. She notes that she has been enjoying gardening, but developed some swelling in her hands after doing so. She endorses a small lump in her left hand.  Lab results today (07/25/18) of CBC w/diff and CMP is as follows: all values are WNL except for WBC at 17k, ANC at 11.6k, Basophils abs at 600, Abs immature granulocytes at 0.73k, Glucose at 109. 07/25/18 LDH is  246 07/25/18 Ferritin is 66  On review of systems, pt reports good energy levels, eating well, and denies concerns for infections, fevers, leg swelling, new abdominal pains, and any other symptoms.   MEDICAL HISTORY:  Past Medical History:  Diagnosis Date  . Anxiety   . Arthritis   . Cervical intraepithelial neoplasia grade 3 2002   s/p LEEP Rx repeat pap negative  . Cervical radiculopathy   . CML (chronic myelocytic leukemia) ([place]) 06/03/2018  . Fallopian tube disorder    right fallopian tube removed  . Gallstones 09/2015   On CT  . Headache    Migraines  . Hemorrhagic ovarian cyst 02/08/2016  . Hydrosalpinx    [facility]  . Hydrosalpinx 02/08/2016  . Interstitial cystitis   . Neurological abnormality    Neg workup with MRI/MRA in 2007 WNL except decreased caliber MCA proximally, distal cavernous portion of left LCA which may represent true stenosis or technique on exam. Evaluated by Dr Linus Salmons.  . Palpitations   . Partial seizures (HCC)    questionable diag  . Pneumonia    walking pneumonia  . Polycystic ovary    multiple ovarian cysts removed  . S/P cervical discectomy    Dr Vertell Limber, anterior discectomy C5-C7  . S/P epidural steroid injection    Right hip  . Seizures ([place]) 2005   simple partial seizure disorder    SURGICAL HISTORY: Past Surgical History:  Procedure Laterality Date  . APPENDECTOMY  1986  . BUNIONECTOMY Left    bunion removal  . CERVICAL DISC SURGERY  2005   C5-C7  . COLONOSCOPY    . DENTAL SURGERY    . fallopian tue removed    . LAPAROSCOPIC BILATERAL SALPINGECTOMY Left 02/09/2016   Procedure: LAPAROSCOPIC LEFT  SALPINGECTOMY;  Surgeon: Janyth Contes, MD;  Location: [facility];  Service: Gynecology;  Laterality: Left;  . LAPAROSCOPIC LYSIS OF ADHESIONS  02/09/2016   Procedure: LAPAROSCOPIC LYSIS OF ADHESIONS;  Surgeon: Janyth Contes, MD;  Location: [facility];  Service: Gynecology;;  momentum to adenexa  . LAPAROSCOPIC  OVARIAN CYSTECTOMY Right 02/09/2016   Procedure: LAPAROSCOPIC OVARIAN CYSTECTOMY;  Surgeon: Janyth Contes, MD;  Location: [facility];  Service: Gynecology;  Laterality: Right;  x2 to right ovary  . LEEP  1998  . OOPHORECTOMY Left 02/09/2016   Procedure: LEFT OOPHORECTOMY;  Surgeon: Janyth Contes, MD;  Location: [facility];  Service: Gynecology;  Laterality: Left;  . TONSILLECTOMY  2001  . UPPER GI ENDOSCOPY      SOCIAL HISTORY: Social History   Socioeconomic History  . Marital status: Divorced    Spouse name: Not on file  . Number of children: 0  . Years of education: Not on file  . Highest education level: Not on file  Occupational History  . 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  . Occupation: Pharmacist, hospital  Social Needs  . Financial resource strain: Not on file  . Food insecurity:    Worry: Not on file  Inability: Not on file  . Transportation needs:    Medical: Not on file    Non-medical: Not on file  Tobacco Use  . Smoking status: Former Smoker    Packs/day: 0.00    Years: 0.00    Pack years: 0.00    Types: Cigarettes    Last attempt to quit: 11/02/2015    Years since quitting: 2.7  . Smokeless tobacco: Never Used  . Tobacco comment: currently smoking, began at beginning of June and plans to quit on June 14th. Has smoked off and on in the past.  Substance and Sexual Activity  . Alcohol use: No    Alcohol/week: 1.0 - 2.0 standard drinks    Types: 1 - 2 Standard drinks or equivalent per week    Comment: occasionally 0-2 per day  . Drug use: No  . Sexual activity: Yes  Lifestyle  . Physical activity:    Days per week: Not on file    Minutes per session: Not on file  . Stress: Not on file  Relationships  . Social connections:    Talks on phone: Not on file    Gets together: Not on file    Attends religious service: Not on file    Active member of club or organization: Not on file    Attends meetings of  clubs or organizations: Not on file    Relationship status: Not on file  . Intimate partner violence:    Fear of current or ex partner: Not on file    Emotionally abused: Not on file    Physically abused: Not on file    Forced sexual activity: Not on file  Other Topics Concern  . Not on file  Social History Narrative  . Not on file    FAMILY HISTORY: Family History  Adopted: Yes  Problem Relation Age of Onset  . COPD Mother   . Breast cancer Mother   . Other Mother        Teeth problems- all removed by age 73  . Rashes / Skin problems Sister   . Other Sister        Teeth problems- all removed by age 16  . Rashes / Skin problems Brother   . Asthma Brother   . Other Brother        Teeth problems- all removed by age 46    ALLERGIES:  is allergic to clindamycin/lincomycin; codeine; cyclobenzaprine; gabapentin; nyquil multi-symptom [pseudoeph-doxylamine-dm-apap]; uribel [meth-hyo-m bl-na phos-ph sal]; xanax [alprazolam]; and urelle.  MEDICATIONS:  Current Outpatient Medications  Medication Sig Dispense Refill  . diazepam (VALIUM) 2 MG tablet Take 0.5-1 tablets (1-2 mg total) by mouth every 8 (eight) hours as needed for anxiety. 30 tablet 0  . hydrOXYzine (ATARAX/VISTARIL) 25 MG tablet Take 0.5-1 tablets (12.5-25 mg total) by mouth 3 (three) times daily as needed for nausea or vomiting. 30 tablet 0  . imatinib (GLEEVEC) 100 MG tablet Take 2 tablets (200 mg total) by mouth daily. Take with meals and large glass of water. 60 tablet 1   No current facility-administered medications for this visit.     REVIEW OF SYSTEMS:    A 10+ POINT REVIEW OF SYSTEMS WAS OBTAINED including neurology, dermatology, psychiatry, cardiac, respiratory, lymph, extremities, GI, GU, Musculoskeletal, constitutional, breasts, reproductive, HEENT.  All pertinent positives are noted in the HPI.  All others are negative.   PHYSICAL EXAMINATION: ECOG PERFORMANCE STATUS: 1 - Symptomatic but completely  ambulatory  . Vitals:   07/25/18 1422  BP: (!) 115/91  Pulse: 88  Resp: 18  Temp: 98.2 F (36.8 C)  SpO2: 99%   Filed Weights   07/25/18 1422  Weight: 175 lb 3.2 oz (79.5 kg)   .Body mass index is 28.71 kg/m.  GENERAL:alert, in no acute distress and comfortable SKIN: no acute rashes, no significant lesions EYES: conjunctiva are pink and non-injected, sclera anicteric OROPHARYNX: MMM, no exudates, no oropharyngeal erythema or ulceration NECK: supple, no JVD LYMPH:  no palpable lymphadenopathy in the cervical, axillary or inguinal regions LUNGS: clear to auscultation b/l with normal respiratory effort HEART: regular rate & rhythm ABDOMEN:  normoactive bowel sounds , non tender, not distended. No palpable hepatosplenomegaly.  Extremity: no pedal edema PSYCH: alert & oriented x 3 with fluent speech NEURO: no focal motor/sensory deficits   LABORATORY DATA:  I have reviewed the data as listed  . CBC Latest Ref Rng & Units 07/25/2018 06/03/2018 05/16/2018  WBC 4.0 - 10.5 K/uL 17.0(H) 18.8(H) 13.6(H)  Hemoglobin 12.0 - 15.0 g/dL 13.6 14.1 15.0  Hematocrit 36.0 - 46.0 % 41.3 43.0 45.9  Platelets 150 - 400 K/uL 310 288 278   . CBC    Component Value Date/Time   WBC 17.0 (H) 07/25/2018 1358   RBC 4.85 07/25/2018 1358   HGB 13.6 07/25/2018 1358   HGB 14.1 06/03/2018 0942   HGB 15.1 03/26/2017 1101   HCT 41.3 07/25/2018 1358   HCT 44.0 03/26/2017 1101   PLT 310 07/25/2018 1358   PLT 288 06/03/2018 0942   PLT 330 03/26/2017 1101   MCV 85.2 07/25/2018 1358   MCV 84 03/26/2017 1101   MCH 28.0 07/25/2018 1358   MCHC 32.9 07/25/2018 1358   RDW 13.4 07/25/2018 1358   RDW 14.0 03/26/2017 1101   LYMPHSABS 2.9 07/25/2018 1358   LYMPHSABS 2.8 03/26/2017 1101   MONOABS 0.8 07/25/2018 1358   EOSABS 0.4 07/25/2018 1358   EOSABS 0.4 03/26/2017 1101   BASOSABS 0.6 (H) 07/25/2018 1358   BASOSABS 0.2 03/26/2017 1101     . CMP Latest Ref Rng & Units 07/25/2018 06/03/2018 04/10/2018   Glucose 70 - 99 mg/dL 109(H) 96 111(H)  BUN 6 - 20 mg/dL '12 11 10  '$ Creatinine 0.44 - 1.00 mg/dL 0.83 0.85 0.87  Sodium 135 - 145 mmol/L 139 138 137  Potassium 3.5 - 5.1 mmol/L 3.8 4.3 4.2  Chloride 98 - 111 mmol/L 106 104 102  CO2 22 - 32 mmol/L '24 28 29  '$ Calcium 8.9 - 10.3 mg/dL 9.0 9.6 9.4  Total Protein 6.5 - 8.1 g/dL 7.2 7.0 7.2  Total Bilirubin 0.3 - 1.2 mg/dL 0.4 0.4 0.4  Alkaline Phos 38 - 126 U/L 53 52 54  AST 15 - 41 U/L '17 16 16  '$ ALT 0 - 44 U/L '16 17 20   '$ 06/03/18 FISH:   05/16/18 Cytogenetics:   04/10/18 Hemochromatosis Panel:    RADIOGRAPHIC STUDIES: I have personally reviewed the radiological images as listed and agreed with the findings in the report. No results found.  ASSESSMENT & PLAN:  48 y.o. female with  1. CML- Chronic phase 05/16/18 BM Cytogenetics revealed translocation 9;22. FISH report revealed BCR-ABL gene rearrangement present in 86.5% of cells 06/03/18 FISH revealed BCR-ABL gene rearrangement present in 85.67% of cells, with some hypercellularity around 80% 06/05/18 US Abdomen revealed normal spleen size  2. 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 -Will pursue steady dose escalation per displayed tolerance -Discussed that the patient's CML appears to be slow moving based on 86% mutated cells, and minimal blood count abnormalities -Discussed that I do not believe that many of her unique symptoms are attributable to her new CML diagnosis, and discussed that treating her CML will likely not lead to complete symptomatic relief -Recommend continuing follow up with PCP for evaluation of her symptoms -Will order baseline EKG today -Will see the pt back in 4 weeks   Labs in 4 weeks Webex visit 2 days after labs   All of the patients questions were  answered with apparent satisfaction. The patient knows to call the clinic with any problems, questions or concerns.  The total time spent in the appt was 20 minutes and more than 50% was on counseling and direct patient cares.    Sullivan Lone MD MS AAHIVMS [facility] Hematology/Oncology Physician [facility]  (Office):       [phone] (Work cell):  [phone] (Fax):           [phone]  07/25/2018 2:54 PM  I, Baldwin Jamaica, am acting as a scribe for Dr. Sullivan Lone.   .I have reviewed the above documentation for accuracy and completeness, and I agree with the above. Brunetta Genera MD

## 2018-07-25 ENCOUNTER — Inpatient Hospital Stay (HOSPITAL_BASED_OUTPATIENT_CLINIC_OR_DEPARTMENT_OTHER): Payer: BLUE CROSS/BLUE SHIELD | Admitting: Hematology

## 2018-07-25 ENCOUNTER — Other Ambulatory Visit: Payer: Self-pay

## 2018-07-25 ENCOUNTER — Telehealth: Payer: Self-pay | Admitting: Hematology

## 2018-07-25 ENCOUNTER — Inpatient Hospital Stay: Payer: BLUE CROSS/BLUE SHIELD | Attending: Family

## 2018-07-25 VITALS — BP 115/91 | HR 88 | Temp 98.2°F | Resp 18 | Ht 65.5 in | Wt 175.2 lb

## 2018-07-25 DIAGNOSIS — C9210 Chronic myeloid leukemia, BCR/ABL-positive, not having achieved remission: Secondary | ICD-10-CM

## 2018-07-25 LAB — FERRITIN: Ferritin: 66 ng/mL (ref 11–307)

## 2018-07-25 LAB — CBC WITH DIFFERENTIAL/PLATELET
Abs Immature Granulocytes: 0.73 10*3/uL — ABNORMAL HIGH (ref 0.00–0.07)
Basophils Absolute: 0.6 10*3/uL — ABNORMAL HIGH (ref 0.0–0.1)
Basophils Relative: 3 %
Eosinophils Absolute: 0.4 10*3/uL (ref 0.0–0.5)
Eosinophils Relative: 3 %
HCT: 41.3 % (ref 36.0–46.0)
Hemoglobin: 13.6 g/dL (ref 12.0–15.0)
Immature Granulocytes: 4 %
Lymphocytes Relative: 17 %
Lymphs Abs: 2.9 10*3/uL (ref 0.7–4.0)
MCH: 28 pg (ref 26.0–34.0)
MCHC: 32.9 g/dL (ref 30.0–36.0)
MCV: 85.2 fL (ref 80.0–100.0)
Monocytes Absolute: 0.8 10*3/uL (ref 0.1–1.0)
Monocytes Relative: 5 %
Neutro Abs: 11.6 10*3/uL — ABNORMAL HIGH (ref 1.7–7.7)
Neutrophils Relative %: 68 %
Platelets: 310 10*3/uL (ref 150–400)
RBC: 4.85 MIL/uL (ref 3.87–5.11)
RDW: 13.4 % (ref 11.5–15.5)
WBC: 17 10*3/uL — ABNORMAL HIGH (ref 4.0–10.5)
nRBC: 0 % (ref 0.0–0.2)

## 2018-07-25 LAB — CMP (CANCER CENTER ONLY)
ALT: 16 U/L (ref 0–44)
AST: 17 U/L (ref 15–41)
Albumin: 4 g/dL (ref 3.5–5.0)
Alkaline Phosphatase: 53 U/L (ref 38–126)
Anion gap: 9 (ref 5–15)
BUN: 12 mg/dL (ref 6–20)
CO2: 24 mmol/L (ref 22–32)
Calcium: 9 mg/dL (ref 8.9–10.3)
Chloride: 106 mmol/L (ref 98–111)
Creatinine: 0.83 mg/dL (ref 0.44–1.00)
GFR, Est AFR Am: >60 mL/min (ref >60)
GFR, Estimated: >60 mL/min (ref >60)
Glucose, Bld: 109 mg/dL — ABNORMAL HIGH (ref 70–99)
Potassium: 3.8 mmol/L (ref 3.5–5.1)
Sodium: 139 mmol/L (ref 135–145)
Total Bilirubin: 0.4 mg/dL (ref 0.3–1.2)
Total Protein: 7.2 g/dL (ref 6.5–8.1)

## 2018-07-25 LAB — LACTATE DEHYDROGENASE: LDH: 246 U/L — ABNORMAL HIGH (ref 98–192)

## 2018-07-25 NOTE — Telephone Encounter (Deleted)
Called and scheduled appt per 4/23 los.

## 2018-07-25 NOTE — Telephone Encounter (Signed)
Called and scheduled appt per 4/23 los.  Left a VM of scheduled appts.

## 2018-07-31 ENCOUNTER — Telehealth: Payer: Self-pay | Admitting: *Deleted

## 2018-07-31 NOTE — Telephone Encounter (Signed)
Dr. Irene Limbo ordered baseline EKG following appt on 4/23. EKG was not done at that appt.  Per Dr. Irene Limbo, patient can have EKG as OP at [facility] before beginning Imatinib 100 mg or she can begin medication at this time and have EKG on next appt 5/19.  Patient states she wants to minimize exposure to COVID 19 by not going to [facility] and will start medication today and have EKG at next appt with Dr. Irene Limbo. Patient instructed to contact office with any concerns r/t medication. Patient verbalized understanding.

## 2018-08-01 ENCOUNTER — Encounter: Payer: Self-pay | Admitting: Hematology

## 2018-08-02 ENCOUNTER — Telehealth: Payer: Self-pay | Admitting: *Deleted

## 2018-08-02 NOTE — Telephone Encounter (Signed)
Contacted patient in response to MyChart message regarding Imatinib dose increase. Per Dr. Irene Limbo, keep dose at 100mg  at this time. Patient verbalized understanding.

## 2018-08-07 LAB — BCR/ABL

## 2018-08-08 ENCOUNTER — Encounter: Payer: Self-pay | Admitting: General Practice

## 2018-08-08 NOTE — Progress Notes (Signed)
[place] Spiritual Care Note  Follow Yliana through [facility] Support [facility]. Mailing handwritten note of encouragement with [employer] online programming information as a check-in while groups are suspended during covid-19.   [place], [place], [facility] Pager [phone] Voicemail [phone]

## 2018-08-20 ENCOUNTER — Other Ambulatory Visit: Payer: Self-pay

## 2018-08-20 ENCOUNTER — Telehealth: Payer: Self-pay | Admitting: Hematology

## 2018-08-20 ENCOUNTER — Inpatient Hospital Stay: Payer: BLUE CROSS/BLUE SHIELD | Attending: Family

## 2018-08-20 DIAGNOSIS — Z79899 Other long term (current) drug therapy: Secondary | ICD-10-CM | POA: Insufficient documentation

## 2018-08-20 DIAGNOSIS — Z87891 Personal history of nicotine dependence: Secondary | ICD-10-CM | POA: Insufficient documentation

## 2018-08-20 DIAGNOSIS — C9210 Chronic myeloid leukemia, BCR/ABL-positive, not having achieved remission: Secondary | ICD-10-CM

## 2018-08-20 LAB — CBC WITH DIFFERENTIAL/PLATELET
Abs Immature Granulocytes: 0.04 10*3/uL (ref 0.00–0.07)
Basophils Absolute: 0.1 10*3/uL (ref 0.0–0.1)
Basophils Relative: 2 %
Eosinophils Absolute: 0.4 10*3/uL (ref 0.0–0.5)
Eosinophils Relative: 4 %
HCT: 39.6 % (ref 36.0–46.0)
Hemoglobin: 12.9 g/dL (ref 12.0–15.0)
Immature Granulocytes: 0 %
Lymphocytes Relative: 19 %
Lymphs Abs: 1.8 10*3/uL (ref 0.7–4.0)
MCH: 28.1 pg (ref 26.0–34.0)
MCHC: 32.6 g/dL (ref 30.0–36.0)
MCV: 86.3 fL (ref 80.0–100.0)
Monocytes Absolute: 0.4 10*3/uL (ref 0.1–1.0)
Monocytes Relative: 5 %
Neutro Abs: 6.6 10*3/uL (ref 1.7–7.7)
Neutrophils Relative %: 70 %
Platelets: 258 10*3/uL (ref 150–400)
RBC: 4.59 MIL/uL (ref 3.87–5.11)
RDW: 13.8 % (ref 11.5–15.5)
WBC: 9.4 10*3/uL (ref 4.0–10.5)
nRBC: 0 % (ref 0.0–0.2)

## 2018-08-20 LAB — CMP (CANCER CENTER ONLY)
ALT: 20 U/L (ref 0–44)
AST: 15 U/L (ref 15–41)
Albumin: 3.8 g/dL (ref 3.5–5.0)
Alkaline Phosphatase: 48 U/L (ref 38–126)
Anion gap: 7 (ref 5–15)
BUN: 11 mg/dL (ref 6–20)
CO2: 26 mmol/L (ref 22–32)
Calcium: 9 mg/dL (ref 8.9–10.3)
Chloride: 106 mmol/L (ref 98–111)
Creatinine: 1 mg/dL (ref 0.44–1.00)
GFR, Est AFR Am: >60 mL/min (ref >60)
GFR, Estimated: >60 mL/min (ref >60)
Glucose, Bld: 115 mg/dL — ABNORMAL HIGH (ref 70–99)
Potassium: 4.2 mmol/L (ref 3.5–5.1)
Sodium: 139 mmol/L (ref 135–145)
Total Bilirubin: 0.5 mg/dL (ref 0.3–1.2)
Total Protein: 6.8 g/dL (ref 6.5–8.1)

## 2018-08-20 LAB — LACTATE DEHYDROGENASE: LDH: 178 U/L (ref 98–192)

## 2018-08-20 NOTE — Telephone Encounter (Signed)
Left vm for pt to call back to Scheduling dept. re 08/22/18 office visit to try and convert into Webex mtg.

## 2018-08-20 NOTE — Telephone Encounter (Signed)
Patient called regarding upcoming appointments, patient is notified of Webex appointment and e-mail has been sent.

## 2018-08-21 NOTE — Progress Notes (Signed)
HEMATOLOGY/ONCOLOGY CONSULTATION NOTE  Date of Service: 08/22/2018  Patient Care Team: Copland, Gay Filler, MD as PCP - General (Family Medicine)  CHIEF COMPLAINTS/PURPOSE OF CONSULTATION:  Chronic Myeloid Leukemia  HISTORY OF PRESENTING ILLNESS:   Victoria Hall is a wonderful 48 y.o. female who has been referred to [place] by Dr. Silvestre Mesi for evaluation and management of CML. The pt was formerly under the care of my colleague Dr. Burney Gauze, and has transferred her care closer to home. The pt reports that she is doing well overall.  Prior to today's visit, the pt was recently diagnosed with CML after a 05/16/18 bone marrow biopsy revealed a translocation 9;22 and FISH study revealed BCR-ABL gene rearranagement in 85% of cells. The pt was prescribed 435m Imatinib with Dr. EMarin Olpon 06/03/18.  The pt reports that she had intermittent blurry vision for the last 4 years. She notes that she had night sweats for the last 20 years. She notes that her WBC have been elevated for the last 4 years. She notes that she has had elevated Basophils since April 2018. She has been evaluated for mast cell activation, and has had normal tryptase levels, including most recently on 06/03/18.  [facility]. The pt notes that she has "exercise intolerance," for the last 4 years, which she describes as needing to "lie on the couch for 2 weeks," after taking a run.  She has had intermittent rashes and hives for the last 20 years, which do resolve on their own. She keeps a food and activity journal for the last 2 years, and denies awareness of any associations with her rash onset. She has seen dermatology and immunology regarding this. The pt has also seen a rheumatologist for evaluation.  The pt notes that her hands and feet frequently fall asleep, and has been evaluated by neurology with an NCV. She notes that she has cervical  radiculopathy which she relates to her 2005 C5-7 fusion. She endorses exhaustion which "comes on out of no where." She notes her energy levels change "from minute to minute."   The pt denies abdominal pains or mouth sores.   The pt endorse frequent muscle and joint cramping as well. She has tried using CBD oil which she found to be helpful, but notes that the cost became prohibitive.  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  Most recent lab results (06/03/18) of CBC w/diff and CMP is as follows: all values are WNL except for WBC at 18.8k, ANC at 12.9k Basophils abs at 600, Abs immature granulocytes at 1.02k 06/24/18 LDH at 237  On review of systems, pt reports intermittent sudden-onset fatigue, intermittent headaches, intermittent hives and rashes, muscle and joint cramping, and denies abdominal pains, mouth sores, leg swelling, and any other symptoms.   On PMHx the pt reports chronic cervical radiculopathy with fusion of C5-7 in 2005. 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  Interval History:   I connected with [name] 08/22/18 at  9:40 AM EDT by Webex and verified that I am speaking with the correct person using two identifiers.  I discussed the limitations, risks, security and privacy concerns of performing an evaluation and management service by telemedicine and the availability  of in-person appointments. I also discussed with the patient that there may be a patient responsible charge related to this service. The patient expressed understanding and agreed to proceed.   Other persons participating in the visit and their role in the encounter: none  Patient's location: her home Provider's location: my office at the [facility] [place] is called today  for management and evaluation of her CML. The patient's last visit with [place] was on 07/25/18. The pt reports that she is doing well overall.  The pt reports that she has been able to begin 154m Imatinib in the interim, and began this about 4 weeks ago. She notes that she is tolerating this dose well and has intentionally been staying well hydrated. She notes that she has been "really tired for 4 years," and denies any changes in her energy levels. She notes that she has continued eating well and endorses stable weight.  Lab results today (08/20/18) of CBC w/diff and CMP is as follows: all values are WNL except for Glucose at 115. 08/20/18 LDH at 178  On review of systems, pt reports stable energy levels, eating well, stable weight, and denies concern for infections, and any other symptoms.    MEDICAL HISTORY:  Past Medical History:  Diagnosis Date  . Anxiety   . Arthritis   . Cervical intraepithelial neoplasia grade 3 2002   s/p LEEP Rx repeat pap negative  . Cervical radiculopathy   . CML (chronic myelocytic leukemia) ([facility] 06/03/2018  . Fallopian tube disorder    right fallopian tube removed  . Gallstones 09/2015   On CT  . Headache    Migraines  . Hemorrhagic ovarian cyst 02/08/2016  . Hydrosalpinx    [facility]  . Hydrosalpinx 02/08/2016  . Interstitial cystitis   . Neurological abnormality    Neg workup with MRI/MRA in 2007 WNL except decreased caliber MCA proximally, distal cavernous portion of left LCA which may represent true stenosis or technique on exam. Evaluated by Dr CLinus Salmons  . Palpitations   . Partial seizures (HCC)    questionable diag  . Pneumonia    walking pneumonia  . Polycystic ovary    multiple ovarian cysts removed  . S/P cervical discectomy    Dr SVertell Limber anterior discectomy C5-C7  . S/P epidural steroid injection    Right hip  . [place] [place]   simple partial seizure disorder    SURGICAL HISTORY: Past Surgical History:  Procedure  Laterality Date  . APPENDECTOMY  1986  . BUNIONECTOMY Left    bunion removal  . CERVICAL DISC SURGERY  2005   C5-C7  . COLONOSCOPY    . DENTAL SURGERY    . fallopian tue removed    . LAPAROSCOPIC BILATERAL SALPINGECTOMY Left 02/09/2016   Procedure: LAPAROSCOPIC LEFT  SALPINGECTOMY;  Surgeon: JJanyth Contes MD;  [facility];  Service: Gynecology;  Laterality: Left;  . LAPAROSCOPIC LYSIS OF ADHESIONS  02/09/2016   Procedure: LAPAROSCOPIC LYSIS OF ADHESIONS;  Surgeon: JJanyth Contes MD;  [place];  Service: Gynecology;;  momentum to adenexa  . LAPAROSCOPIC OVARIAN CYSTECTOMY Right 02/09/2016   Procedure: LAPAROSCOPIC OVARIAN CYSTECTOMY;  Surgeon: JJanyth Contes MD;  [facility];  Service: Gynecology;  Laterality: Right;  x2 to right ovary  . LEEP  1998  . OOPHORECTOMY Left 02/09/2016   Procedure: LEFT OOPHORECTOMY;  Surgeon: JJanyth Contes MD;  [facility];  Service: Gynecology;  Laterality: Left;  . TONSILLECTOMY  2001  . UPPER GI ENDOSCOPY      SOCIAL HISTORY: Social History   Socioeconomic History  . Marital status: Married    Spouse name: Not on file  . Number of children: 0  . Years of education: Not on file  . Highest education level: Not on file  Occupational History  . 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  . Occupation: Pharmacist, hospital  Social Needs  . Financial resource strain: Not on file  . Food insecurity:    Worry: Not on file    Inability: Not on file  . Transportation needs:    Medical: Not on file    Non-medical: Not on file  Tobacco Use  . Smoking status: Former Smoker    Packs/day: 0.00    Years: 0.00    Pack years: 0.00    Types: Cigarettes    Last attempt to quit: 11/02/2015    Years since quitting: 2.8  . Smokeless tobacco: Never Used  . Tobacco comment: currently smoking, began at beginning of June and plans to quit on June 14th. Has smoked off and on  in the past.  Substance and Sexual Activity  . Alcohol use: No    Alcohol/week: 1.0 - 2.0 standard drinks    Types: 1 - 2 Standard drinks or equivalent per week    Comment: occasionally 0-2 per day  . Drug use: No  . Sexual activity: Yes  Lifestyle  . Physical activity:    Days per week: Not on file    Minutes per session: Not on file  . Stress: Not on file  Relationships  . Social connections:    Talks on phone: Not on file    Gets together: Not on file    Attends religious service: Not on file    Active member of club or organization: Not on file    Attends meetings of clubs or organizations: Not on file    Relationship status: Not on file  . Intimate partner violence:    Fear of current or ex partner: Not on file    Emotionally abused: Not on file    Physically abused: Not on file    Forced sexual activity: Not on file  Other Topics Concern  . Not on file  Social History Narrative  . Not on file    FAMILY HISTORY: Family History  Adopted: Yes  Problem Relation Age of Onset  . COPD Mother   . Breast cancer Mother   . Other Mother        Teeth problems- all removed by age 63  . Rashes / Skin problems Sister   . Other Sister        Teeth problems- all removed by age 17  . Rashes / Skin problems Brother   . Asthma Brother   . Other Brother        Teeth problems- all removed by age 67    ALLERGIES:  is allergic to clindamycin/lincomycin; codeine; cyclobenzaprine; gabapentin; nyquil multi-symptom [pseudoeph-doxylamine-dm-apap]; uribel [meth-hyo-m bl-na phos-ph sal]; xanax [alprazolam]; and urelle.  MEDICATIONS:  Current Outpatient Medications  Medication Sig Dispense Refill  . diazepam (VALIUM) 2 MG tablet Take 0.5-1 tablets (1-2 mg total) by mouth every 8 (eight) hours as needed for anxiety. 30 tablet 0  . hydrOXYzine (ATARAX/VISTARIL) 25 MG tablet Take 0.5-1 tablets (12.5-25 mg total) by mouth 3 (three) times daily as needed for nausea or vomiting. 30 tablet 0   .  imatinib (GLEEVEC) 100 MG tablet Take 2 tablets (200 mg total) by mouth daily. Take with meals and large glass of water. 60 tablet 1   No current facility-administered medications for this visit.     REVIEW OF SYSTEMS:    A 10+ POINT REVIEW OF SYSTEMS WAS OBTAINED including neurology, dermatology, psychiatry, cardiac, respiratory, lymph, extremities, GI, GU, Musculoskeletal, constitutional, breasts, reproductive, HEENT.  All pertinent positives are noted in the HPI.  All others are negative.   PHYSICAL EXAMINATION: ECOG PERFORMANCE STATUS: 1 - Symptomatic but completely ambulatory  . There were no vitals filed for this visit. There were no vitals filed for this visit. .There is no height or weight on file to calculate BMI.  Webex visit  LABORATORY DATA:  I have reviewed the data as listed  . CBC Latest Ref Rng & Units 08/20/2018 07/25/2018 06/03/2018  WBC 4.0 - 10.5 K/uL 9.4 17.0(H) 18.8(H)  Hemoglobin 12.0 - 15.0 g/dL 12.9 13.6 14.1  Hematocrit 36.0 - 46.0 % 39.6 41.3 43.0  Platelets 150 - 400 K/uL 258 310 288   . CBC    Component Value Date/Time   WBC 9.4 08/20/2018 1203   RBC 4.59 08/20/2018 1203   HGB 12.9 08/20/2018 1203   HGB 14.1 06/03/2018 0942   HGB 15.1 03/26/2017 1101   HCT 39.6 08/20/2018 1203   HCT 44.0 03/26/2017 1101   PLT 258 08/20/2018 1203   PLT 288 06/03/2018 0942   PLT 330 03/26/2017 1101   MCV 86.3 08/20/2018 1203   MCV 84 03/26/2017 1101   MCH 28.1 08/20/2018 1203   MCHC 32.6 08/20/2018 1203   RDW 13.8 08/20/2018 1203   RDW 14.0 03/26/2017 1101   LYMPHSABS 1.8 08/20/2018 1203   LYMPHSABS 2.8 03/26/2017 1101   MONOABS 0.4 08/20/2018 1203   EOSABS 0.4 08/20/2018 1203   EOSABS 0.4 03/26/2017 1101   BASOSABS 0.1 08/20/2018 1203   BASOSABS 0.2 03/26/2017 1101     . CMP Latest Ref Rng & Units 08/20/2018 07/25/2018 06/03/2018  Glucose 70 - 99 mg/dL 115(H) 109(H) 96  BUN 6 - 20 mg/dL _0 Creatinine 0.44 - 1.00 mg/dL 1.00 0.83 0.85  Sodium  135 - 145 mmol/L 139 139 138  Potassium 3.5 - 5.1 mmol/L 4.2 3.8 4.3  Chloride 98 - 111 mmol/L 106 106 104  CO2 22 - 32 mmol/L _1 Calcium 8.9 - 10.3 mg/dL 9.0 9.0 9.6  Total Protein 6.5 - 8.1 g/dL 6.8 7.2 7.0  Total Bilirubin 0.3 - 1.2 mg/dL 0.5 0.4 0.4  Alkaline Phos 38 - 126 U/L 48 53 52  AST 15 - 41 U/L _2 ALT 0 - 44 U/L _3 07/25/18 Initial BCR-ABL:    06/03/18 FISH:   05/16/18 Cytogenetics:   04/10/18 Hemochromatosis Panel:    RADIOGRAPHIC STUDIES: I have personally reviewed the radiological images as listed and agreed with the findings in the report. No results found.  01/31/18 ECHO Transthoracic:    ASSESSMENT & PLAN:  48 y.o. female with  1. CML- Chronic phase 05/16/18 BM Cytogenetics revealed translocation 9;22. FISH report revealed BCR-ABL gene rearrangement present in 86.5% of cells 06/03/18 FISH revealed BCR-ABL gene rearrangement present in 85.67% of cells, with some hypercellularity around 80% 06/05/18 US Abdomen revealed normal spleen size 01/31/18 ECHO revealed LV EF of 63%, no regional wall motion abnormalities, no significant valvular hear disease  2. 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 Normal HGB and PLT. Chemistries also normal. LDH normalized to 178. Pt has achieved hematologic remission. -Initial pre-treatment 07/25/18 BCR-ABL revealed IS at 71.1362% -Discussed that the 3 month goal is to have IS less than 10%. -Discussed that pt may proceed to 258m Imatinib at this time as she reports a normal EKG in the last 6 months, and I was able to review her 01/31/18 ECHO Transthoracic which did not reveal structural abnormalities, as noted above, and pt will provide a copy of her most recent EKG in the interim as well. -Will pursue  steady dose escalation per displayed tolerance -Discussed that I do not believe that many of her unique symptoms are attributable to her new CML diagnosis, and discussed that treating her CML will likely not lead to complete symptomatic relief -Recommend continuing follow up with PCP for evaluation of her symptoms -Will see the pt back in 1 month with labs and EKG   Labs and EKG in 4 weeks Webex visit 2 days after labs and EKG   I discussed the assessment and treatment plan with the patient. The patient was provided an opportunity to ask questions and all were answered. The patient agreed with the plan and demonstrated an understanding of the instructions.   The patient was advised to call back or seek an in-person evaluation if the symptoms worsen or if the condition fails to improve as anticipated.  The total time spent in the appt was 20 minutes and more than 50% was on counseling and direct patient cares.    GSullivan LoneMD MS AAHIVMS SSt Marys Hsptl Med CtrCGlen Oaks HospitalHematology/Oncology Physician CMurdock [facility] LLC (Office):       [phone](Work cell):  [phone](Fax):           [phone] 08/22/2018 10:24 AM  I, SBaldwin Jamaica am acting as a scribe for Dr. GSullivan Lone   .I have reviewed the above documentation for accuracy and completeness, and I agree with the above. .Brunetta GeneraMD

## 2018-08-22 ENCOUNTER — Inpatient Hospital Stay (HOSPITAL_BASED_OUTPATIENT_CLINIC_OR_DEPARTMENT_OTHER): Payer: BLUE CROSS/BLUE SHIELD | Admitting: Hematology

## 2018-08-22 DIAGNOSIS — Z79899 Other long term (current) drug therapy: Secondary | ICD-10-CM

## 2018-08-22 DIAGNOSIS — C9210 Chronic myeloid leukemia, BCR/ABL-positive, not having achieved remission: Secondary | ICD-10-CM

## 2018-08-22 DIAGNOSIS — Z87891 Personal history of nicotine dependence: Secondary | ICD-10-CM | POA: Diagnosis not present

## 2018-08-23 ENCOUNTER — Encounter: Payer: Self-pay | Admitting: Hematology

## 2018-08-25 ENCOUNTER — Encounter: Payer: Self-pay | Admitting: Family Medicine

## 2018-08-27 ENCOUNTER — Telehealth: Payer: Self-pay | Admitting: Family Medicine

## 2018-08-27 ENCOUNTER — Telehealth: Payer: Self-pay | Admitting: Hematology

## 2018-08-27 NOTE — Progress Notes (Addendum)
[facility] [address], [place], [place] [address] [phone]  Date:  08/28/2018   Name:  Victoria Hall   DOB:  Mar 05, 1971   MRN:  [id number]  PCP:  Darreld Mclean, MD    Chief Complaint: Needing EKG   History of Present Illness:  Victoria Hall is a 48 y.o. very pleasant female patient who presents with the following:  In person visit today, for couple of concerns She has CML, diagnosed in February with bone marrow biopsy She is responding very well to [facility] therapy.  However per recent oncology note, they do not think some of her other symptoms such as hypomobility and chronic fatigue are necessarily due to her CML.  Despite this, Victoria Hall does feel that she is overall much better, and that the medication seems to be helping with many of her symptoms  Victoria Hall recently sent me the following message: The good news is that Dr. Irene Limbo tells me I have already had a Complete Hematological Response on Imatinib, though I am only taking 140m/day (this is 1/4 of the recommended starting dose).  The bad news is two-fold:  1. Dr. KIrene Limbois not comfortable increasing my dosage until I have a recent EKG. [facility]? Otherwise I will have to wait yet another month to increase my dosage.  2. I'm still having daily pain in my sternum, for almost 5 months now. I've brought it up to Dr. KIrene Limbo and he told me, "take it up with your primary."  You'd offered an x-ray back in January. I think it may be time to take you up on some imaging. Would an x-ray show anything other than a fracture or dislocation?  I would be happy to come in for imaging and an EKG, and follow up a couple of days later with you in a web-appointment, if you'd like. That would limit exposure for both of [place]   She does note that she is feeling much better- she feels better than she felt in 4 years, although she still has low tolerance to  exercise She is taking Gleevec 200 mg daily- they would like to get her up to a higher dose but her oncologist would like an EKG first.  We can certainly do this for her today  She also has noted a tenderness over her sternum for several months -I think we first discussed this back in February, see my note from February 24.  It seemed to start when she was stretching She notes tenderness if she extends her neck and upper back, or is she activates her pec muscles  No chest pressure or heaviness She has poor exercise tolerance, but this is normal.  No particular shortness of breath She has pain only with movement of her trunk or lifting/pushing motions Patient Active Problem List   Diagnosis Date Noted  . CML (chronic myelocytic leukemia) ([place] 06/03/2018  . Hemochromatosis 04/18/2018  . Hypermobility syndrome 09/26/2017  . 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 POTS (postural orthostatic tachycardia syndrome) 08/10/2017  . Incomplete right bundle branch block 07/11/2017  . Fatigue 07/11/2017  . Low back pain 07/21/2016  . Right knee pain 06/05/2016  . Left shoulder pain 06/05/2016  . Hydrosalpinx 02/08/2016  . Hemorrhagic ovarian cyst 02/08/2016  . Right hip pain 02/03/2016  . HYDROSALPINX 02/07/2008  . HEADACHE 10/28/2007  . TACHYCARDIA 10/14/2007  .  ANXIETY DISORDER 05/28/2007  . POLYARTHRITIS 05/09/2007  . UNSPECIFIED DISORDERS OF NERVOUS SYSTEM 04/22/2007  . ABDOMINAL PAIN RIGHT LOWER QUADRANT 04/08/2007  . PALPITATIONS, RECURRENT 11/07/2006  . PROBLEMS W/SMELL/TASTE 06/29/2006  . TOBACCO ABUSE 01/08/2006  . VISUAL IMPAIRMENT 01/08/2006  . DISTURBANCE, VISUAL NOS 01/08/2006  . FIBROCYSTIC BREAST DISEASE 01/08/2006  . [place] DISEASE, CERVICAL 01/08/2006  . VERTIGO 01/08/2006    Past Medical History:  Diagnosis Date  . Anxiety   . Arthritis   . Cervical intraepithelial neoplasia grade 3 2002   s/p LEEP Rx repeat pap negative  . Cervical radiculopathy   . CML  (chronic myelocytic leukemia) ([place]) 06/03/2018  . Fallopian tube disorder    right fallopian tube removed  . Gallstones 09/2015   On CT  . Headache    Migraines  . Hemorrhagic ovarian cyst 02/08/2016  . Hydrosalpinx    [facility]  . Hydrosalpinx 02/08/2016  . Interstitial cystitis   . Neurological abnormality    Neg workup with MRI/MRA in 2007 WNL except decreased caliber MCA proximally, distal cavernous portion of left LCA which may represent true stenosis or technique on exam. Evaluated by Dr Linus Salmons.  . Palpitations   . Partial seizures (HCC)    questionable diag  . Pneumonia    walking pneumonia  . Polycystic ovary    multiple ovarian cysts removed  . S/P cervical discectomy    Dr Vertell Limber, anterior discectomy C5-C7  . S/P epidural steroid injection    Right hip  . Seizures ([place]) 2005   simple partial seizure disorder    Past Surgical History:  Procedure Laterality Date  . APPENDECTOMY  1986  . BUNIONECTOMY Left    bunion removal  . CERVICAL DISC SURGERY  2005   C5-C7  . COLONOSCOPY    . DENTAL SURGERY    . fallopian tue removed    . LAPAROSCOPIC BILATERAL SALPINGECTOMY Left 02/09/2016   Procedure: LAPAROSCOPIC LEFT  SALPINGECTOMY;  Surgeon: Janyth Contes, MD;  Location: [facility];  Service: Gynecology;  Laterality: Left;  . LAPAROSCOPIC LYSIS OF ADHESIONS  02/09/2016   Procedure: LAPAROSCOPIC LYSIS OF ADHESIONS;  Surgeon: Janyth Contes, MD;  Location: [facility];  Service: Gynecology;;  momentum to adenexa  . LAPAROSCOPIC OVARIAN CYSTECTOMY Right 02/09/2016   Procedure: LAPAROSCOPIC OVARIAN CYSTECTOMY;  Surgeon: Janyth Contes, MD;  Location: [facility];  Service: Gynecology;  Laterality: Right;  x2 to right ovary  . LEEP  1998  . OOPHORECTOMY Left 02/09/2016   Procedure: LEFT OOPHORECTOMY;  Surgeon: Janyth Contes, MD;  Location: [facility];  Service: Gynecology;  Laterality: Left;  . TONSILLECTOMY  2001  . UPPER GI ENDOSCOPY      Social  History   Tobacco Use  . Smoking status: Former Smoker    Packs/day: 0.00    Years: 0.00    Pack years: 0.00    Types: Cigarettes    Last attempt to quit: 11/02/2015    Years since quitting: 2.8  . Smokeless tobacco: Never Used  . Tobacco comment: currently smoking, began at beginning of June and plans to quit on June 14th. Has smoked off and on in the past.  Substance Use Topics  . Alcohol use: No    Alcohol/week: 1.0 - 2.0 standard drinks    Types: 1 - 2 Standard drinks or equivalent per week    Comment: occasionally 0-2 per day  . Drug use: No    Family History  Adopted: Yes  Problem Relation Age of Onset  .  COPD Mother   . Breast cancer Mother   . Other Mother        Teeth problems- all removed by age 13  . Rashes / Skin problems Sister   . Other Sister        Teeth problems- all removed by age 78  . Rashes / Skin problems Brother   . Asthma Brother   . Other Brother        Teeth problems- all removed by age 86    Allergies  Allergen Reactions  . Clindamycin/Lincomycin Itching and Other (See Comments)    Dizziness, trouble swallowing  . Codeine Itching and Nausea And Vomiting  . Cyclobenzaprine Other (See Comments)    Fast heartbeat, restless leg, nightmares  . Gabapentin Other (See Comments)    Headaches, visual disturbances.   . Nyquil Multi-Symptom [Pseudoeph-Doxylamine-Dm-Apap] Other (See Comments)    Heart racing, dizziness, weakness  . Uribel [Meth-Hyo-M Bl-Na Phos-Ph Sal] Other (See Comments)    Heart racing, dizziness, blurry vision.  . Xanax [Alprazolam]     Bruising  . Urelle Nausea And Vomiting    Medication list has been reviewed and updated.  Current Outpatient Medications on File Prior to Visit  Medication Sig Dispense Refill  . diazepam (VALIUM) 2 MG tablet Take 0.5-1 tablets (1-2 mg total) by mouth every 8 (eight) hours as needed for anxiety. 30 tablet 0  . imatinib (GLEEVEC) 100 MG tablet Take 2 tablets (200 mg total) by mouth daily. Take  with meals and large glass of water. 60 tablet 1  . hydrOXYzine (ATARAX/VISTARIL) 25 MG tablet Take 0.5-1 tablets (12.5-25 mg total) by mouth 3 (three) times daily as needed for nausea or vomiting. (Patient not taking: Reported on 08/28/2018) 30 tablet 0   No current facility-administered medications on file prior to visit.     Review of Systems:  As per HPI- otherwise negative.   Physical Examination: Vitals:   08/28/18 1044  BP: 136/78  Pulse: 98  Resp: 16  Temp: 99.1 F (37.3 C)  SpO2: 98%   Vitals:   08/28/18 1044  Weight: 173 lb (78.5 kg)  Height: 5' 5.5" (1.664 m)   Body mass index is 28.35 kg/m. Ideal Body Weight: Weight in (lb) to have BMI = 25: 152.2  GEN: WDWN, NAD, Non-toxic, A & O x 3, looks well today, mild overweight  HEENT: Atraumatic, Normocephalic. Neck supple. No masses, No LAD.  TM wnl bilaterally  Ears and Nose: No external deformity. CV: RRR, No M/G/R. No JVD. No thrill. No extra heart sounds. She has reproducible tenderness at both the right and left borders of the mid sternum.  No redness, swelling, or skin changes.   PULM: CTA B, no wheezes, crackles, rhonchi. No retractions. No resp. distress. No accessory muscle use. ABD: S, NT, ND EXTR: No c/c/e NEURO Normal gait.  PSYCH: Normally interactive. Conversant. Not depressed or anxious appearing.  Calm demeanor.   EKG compared with tracing from 2019- no significant change. Reassuring tracing, Qtc is normal at 434 Assessment and Plan: Medication monitoring encounter - Plan: EKG 12-Lead  Sternal pain - Plan: DG Chest 2 View, DG Sternum  CML (chronic myelocytic leukemia) (HCC)  Screening for breast cancer - Plan: MM 3D SCREEN BREAST BILATERAL   EKG tracing today at the request of her hematologist, prior to increasing medication dose.  I believe the concern is her QTC, this appears unchanged.  I did send a message to her oncologist  Sternal pain for several months.  This seems to be  musculoskeletal as opposed to cardiac.  Her tenderness is reproducible.  Will obtain plain films of the chest and sternum today.  If these are normal, consider having her see Dr. Tamala Julian with sports medicine.  She is also due for mammogram however, which I will order today  Signed Lamar Blinks, MD  Received her x-ray reports, as follows  Dg Chest 2 View  Result Date: 08/28/2018 CLINICAL DATA:  Chronic upper sternal pain. EXAM: CHEST - 2 VIEW; STERNUM - 2+ VIEW COMPARISON:  Chest x-ray dated September 19, 2017. FINDINGS: The heart size and mediastinal contours are within normal limits. Normal pulmonary vascularity. No focal consolidation, pleural effusion, or pneumothorax. No acute osseous abnormality. No definite sternal fracture. Prior cervical ACDF. IMPRESSION: No definite sternal fracture.  No active cardiopulmonary disease. Electronically Signed   By: Titus Dubin M.D.   On: 08/28/2018 11:46   Dg Sternum  Result Date: 08/28/2018 CLINICAL DATA:  Chronic upper sternal pain. EXAM: CHEST - 2 VIEW; STERNUM - 2+ VIEW COMPARISON:  Chest x-ray dated September 19, 2017. FINDINGS: The heart size and mediastinal contours are within normal limits. Normal pulmonary vascularity. No focal consolidation, pleural effusion, or pneumothorax. No acute osseous abnormality. No definite sternal fracture. Prior cervical ACDF. IMPRESSION: No definite sternal fracture.  No active cardiopulmonary disease. Electronically Signed   By: Titus Dubin M.D.   On: 08/28/2018 11:46   Message to patient, will put in referral to sports medicine

## 2018-08-27 NOTE — Telephone Encounter (Signed)
Copied from [place] [id number]. Topic: Appointment Scheduling - Prior Auth Required for Appointment >> Aug 27, 2018  7:20 AM Victoria Hall, Helene Kelp D wrote: No appointment has been scheduled. Patient is requesting acute appointment for possible x-ray and EKG. Per scheduling protocol, this appointment requires a prior authorization prior to scheduling.  Route to department's PEC pool.

## 2018-08-27 NOTE — Telephone Encounter (Signed)
Virtual visit scheduled.  

## 2018-08-27 NOTE — Telephone Encounter (Signed)
Scheduled appt per 5/21 los.  Left a voice messge of appt date and time.

## 2018-08-28 ENCOUNTER — Encounter: Payer: Self-pay | Admitting: Hematology

## 2018-08-28 ENCOUNTER — Telehealth: Payer: Self-pay | Admitting: Hematology

## 2018-08-28 ENCOUNTER — Ambulatory Visit (HOSPITAL_BASED_OUTPATIENT_CLINIC_OR_DEPARTMENT_OTHER)
Admission: RE | Admit: 2018-08-28 | Discharge: 2018-08-28 | Disposition: A | Discharge status: Home / Self Care | Payer: BLUE CROSS/BLUE SHIELD | Source: Ambulatory Visit | Attending: Family Medicine | Admitting: Family Medicine

## 2018-08-28 ENCOUNTER — Other Ambulatory Visit: Payer: Self-pay | Admitting: Hematology

## 2018-08-28 ENCOUNTER — Encounter: Payer: Self-pay | Admitting: Family Medicine

## 2018-08-28 ENCOUNTER — Other Ambulatory Visit: Payer: Self-pay

## 2018-08-28 ENCOUNTER — Ambulatory Visit (INDEPENDENT_AMBULATORY_CARE_PROVIDER_SITE_OTHER): Payer: BLUE CROSS/BLUE SHIELD | Admitting: Family Medicine

## 2018-08-28 VITALS — BP 136/78 | HR 98 | Temp 99.1°F | Resp 16 | Ht 65.5 in | Wt 173.0 lb

## 2018-08-28 DIAGNOSIS — R0789 Other chest pain: Secondary | ICD-10-CM

## 2018-08-28 DIAGNOSIS — Z1239 Encounter for other screening for malignant neoplasm of breast: Secondary | ICD-10-CM

## 2018-08-28 DIAGNOSIS — C9210 Chronic myeloid leukemia, BCR/ABL-positive, not having achieved remission: Secondary | ICD-10-CM | POA: Diagnosis not present

## 2018-08-28 DIAGNOSIS — Z5181 Encounter for therapeutic drug level monitoring: Secondary | ICD-10-CM

## 2018-08-28 DIAGNOSIS — R072 Precordial pain: Secondary | ICD-10-CM | POA: Diagnosis not present

## 2018-08-28 NOTE — Patient Instructions (Addendum)
Please go to the ground floor for x-rays of your chest and sternum- I will be in touch with your results asap Assuming negative I might have you see Dr. Tamala Julian with sports me- ? Steroid injection EKG looks ok!   I will alert Dr. Irene Limbo for you  I would also suggest that your update your mammogram as soon as you can

## 2018-08-28 NOTE — Addendum Note (Signed)
Addended by: Lamar Blinks C on: 08/28/2018 12:16 PM   Modules accepted: Orders

## 2018-08-28 NOTE — Telephone Encounter (Signed)
Called to inform me that she had her EKG done with her pcp, and that I can cancel the one that I had scheduled and she stated that her provided is going to send the results over to [facility].  I have informed Sandi and she will be keeping a look out for those results.

## 2018-08-30 ENCOUNTER — Encounter: Payer: Self-pay | Admitting: Hematology

## 2018-09-02 ENCOUNTER — Encounter: Payer: Self-pay | Admitting: Family Medicine

## 2018-09-02 ENCOUNTER — Telehealth: Payer: Self-pay | Admitting: *Deleted

## 2018-09-02 NOTE — Telephone Encounter (Signed)
Patient left voice mail this am, asking for Dr. Grier Mitts response to her Mychart message. Attempted to contact patient in response to MyChart questions w/Dr. Irene Limbo response: If EKG was done, okay to increase dose to 200mg  daily-- Blood workup can wait till 6/18. Also advised patient that no EKG had been received at desk fax and gave desk fax number [phone] to share with PCP. Encouraged to contact office for further questions or concerns.

## 2018-09-03 ENCOUNTER — Telehealth: Payer: Self-pay | Admitting: Pharmacist

## 2018-09-03 NOTE — Telephone Encounter (Signed)
Oral Oncology Pharmacist Encounter  Received call from patient today with questions about Gleevec dose increase. [place] start date: 07/06/2018 Patient was originally started on 100 mg once daily with plan for a slow up titration.  Patient's dose was increased to 200 mg once daily starting yesterday, 09/02/2018  Patient states she was reading the prescribing information for imatinib and noted that it states that her CBC should be checked once weekly at dose initiation. She was wondering if she was going to be followed once weekly for labs for the first month of therapy and then biweekly thereafter with her dose increase.  Patient has lab check scheduled at [facility] on 09/19/2018 Patient informed that we have not experienced large decreases in blood cell counts with Gleevec, especially at the dose that has been prescribed for her and that 09/19/2018 lab check is likely early enough after dose increase to catch any issues that may be occurring  We reviewed signs and symptoms of decreased platelet count, hemoglobin, and white blood cell count.  Patient states she is experiencing an increase in muscle pain and cramping, joint pain, and fatigue since dose increase. She states she did experience the side effects at [facility] initiation and they have subsided, and then returned with dose increase.  We discussed maintaining adequate hydration as well as possible vitamin supplementation to aid in her muscle cramping or joint pain.  I offered to transfer patient to Dr. Grier Mitts collaborative practice RN if she would like to have Dr. Irene Limbo discussed with her frequency of lab check.  She stated that based on the information that we discussed today she is okay with waiting until 09/19/2018 for lab check.  All questions answered. She knows to call the office with any additional questions or concerns.  Johny Drilling, PharmD, BCPS, BCOP  09/03/2018 12:38 PM Oral Oncology [facility] [phone]

## 2018-09-18 ENCOUNTER — Telehealth: Payer: Self-pay | Admitting: Hematology

## 2018-09-18 NOTE — Telephone Encounter (Signed)
Called patient regarding upcoming Webex appointment, patient is notified and e-mail has been sent. °

## 2018-09-19 ENCOUNTER — Telehealth: Payer: Self-pay | Admitting: *Deleted

## 2018-09-19 ENCOUNTER — Other Ambulatory Visit: Payer: Self-pay

## 2018-09-19 ENCOUNTER — Inpatient Hospital Stay: Payer: BC Managed Care – PPO | Attending: Family

## 2018-09-19 DIAGNOSIS — C9210 Chronic myeloid leukemia, BCR/ABL-positive, not having achieved remission: Secondary | ICD-10-CM | POA: Insufficient documentation

## 2018-09-19 LAB — CBC WITH DIFFERENTIAL/PLATELET
Abs Immature Granulocytes: 0.01 10*3/uL (ref 0.00–0.07)
Basophils Absolute: 0.1 10*3/uL (ref 0.0–0.1)
Basophils Relative: 1 %
Eosinophils Absolute: 0.2 10*3/uL (ref 0.0–0.5)
Eosinophils Relative: 4 %
HCT: 41.2 % (ref 36.0–46.0)
Hemoglobin: 13.3 g/dL (ref 12.0–15.0)
Immature Granulocytes: 0 %
Lymphocytes Relative: 25 %
Lymphs Abs: 1.5 10*3/uL (ref 0.7–4.0)
MCH: 27.9 pg (ref 26.0–34.0)
MCHC: 32.3 g/dL (ref 30.0–36.0)
MCV: 86.4 fL (ref 80.0–100.0)
Monocytes Absolute: 0.3 10*3/uL (ref 0.1–1.0)
Monocytes Relative: 6 %
Neutro Abs: 3.7 10*3/uL (ref 1.7–7.7)
Neutrophils Relative %: 64 %
Platelets: 225 10*3/uL (ref 150–400)
RBC: 4.77 MIL/uL (ref 3.87–5.11)
RDW: 13.2 % (ref 11.5–15.5)
WBC: 5.9 10*3/uL (ref 4.0–10.5)
nRBC: 0 % (ref 0.0–0.2)

## 2018-09-19 LAB — CMP (CANCER CENTER ONLY)
ALT: 21 U/L (ref 0–44)
AST: 19 U/L (ref 15–41)
Albumin: 4.2 g/dL (ref 3.5–5.0)
Alkaline Phosphatase: 68 U/L (ref 38–126)
Anion gap: 6 (ref 5–15)
BUN: 10 mg/dL (ref 6–20)
CO2: 26 mmol/L (ref 22–32)
Calcium: 8.9 mg/dL (ref 8.9–10.3)
Chloride: 106 mmol/L (ref 98–111)
Creatinine: 1.05 mg/dL — ABNORMAL HIGH (ref 0.44–1.00)
GFR, Est AFR Am: >60 mL/min (ref >60)
GFR, Estimated: >60 mL/min (ref >60)
Glucose, Bld: 93 mg/dL (ref 70–99)
Potassium: 4 mmol/L (ref 3.5–5.1)
Sodium: 138 mmol/L (ref 135–145)
Total Bilirubin: 0.5 mg/dL (ref 0.3–1.2)
Total Protein: 7.1 g/dL (ref 6.5–8.1)

## 2018-09-19 LAB — MAGNESIUM: Magnesium: 1.8 mg/dL (ref 1.7–2.4)

## 2018-09-19 NOTE — Telephone Encounter (Signed)
Patient called. She increased Imatinib dose to 300mg  this past Monday 6/15.  She is tolerating it with some increased fatigue and nausea. She continues to have muscle cramps.  She has webx appt with Dr.Kale on Monday 6/22. She expects MD to increase the dose to400 mg at that time and asked if MD would send in new prescription at that time with new dosing because she is running low on medication.  Advised her that Dr.Kale will be given this information.

## 2018-09-20 ENCOUNTER — Telehealth: Payer: Self-pay | Admitting: Hematology

## 2018-09-20 NOTE — Telephone Encounter (Signed)
Called pt to remind of her video call

## 2018-09-20 NOTE — Progress Notes (Signed)
HEMATOLOGY/ONCOLOGY CONSULTATION NOTE  Date of Service: 09/23/2018  Patient Care Team: Copland, Gay Filler, MD as PCP - General (Family Medicine)  CHIEF COMPLAINTS/PURPOSE OF CONSULTATION:  Chronic Myeloid Leukemia  HISTORY OF PRESENTING ILLNESS:   Victoria Hall is a wonderful 48 y.o. female who has been referred to [place] by Dr. Silvestre Mesi for evaluation and management of CML. The pt was formerly under the care of my colleague Dr. Burney Gauze, and has transferred her care closer to home. The pt reports that she is doing well overall.  Prior to today's visit, the pt was recently diagnosed with CML after a 05/16/18 bone marrow biopsy revealed a translocation 9;22 and FISH study revealed BCR-ABL gene rearranagement in 85% of cells. The pt was prescribed '400mg'$  Imatinib with Dr. Marin Olp on 06/03/18.  The pt reports that she had intermittent blurry vision for the last 4 years. She notes that she had night sweats for the last 20 years. She notes that her WBC have been elevated for the last 4 years. She notes that she has had elevated Basophils since April 2018. She has been evaluated for mast cell activation, and has had normal tryptase levels, including most recently on 06/03/18.  She reports that she went to the [facility] in October 2019 for suspicions of dysautonomia. The pt notes that she has "exercise intolerance," for the last 4 years, which she describes as needing to "lie on the couch for 2 weeks," after taking a run.  She has had intermittent rashes and hives for the last 20 years, which do resolve on their own. She keeps a food and activity journal for the last 2 years, and denies awareness of any associations with her rash onset. She has seen dermatology and immunology regarding this. The pt has also seen a rheumatologist for evaluation.  The pt notes that her hands and feet frequently fall asleep, and has been evaluated by neurology with an NCV. She notes that she has cervical  radiculopathy which she relates to her 2005 C5-7 fusion. She endorses exhaustion which "comes on out of no where." She notes her energy levels change "from minute to minute."   The pt denies abdominal pains or mouth sores.   The pt endorse frequent muscle and joint cramping as well. She has tried using CBD oil which she found to be helpful, but notes that the cost became prohibitive.  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  Most recent lab results (06/03/18) of CBC w/diff and CMP is as follows: all values are WNL except for WBC at 18.8k, ANC at 12.9k Basophils abs at 600, Abs immature granulocytes at 1.02k 06/24/18 LDH at 237  On review of systems, pt reports intermittent sudden-onset fatigue, intermittent headaches, intermittent hives and rashes, muscle and joint cramping, and denies abdominal pains, mouth sores, leg swelling, and any other symptoms.   On PMHx the pt reports chronic cervical radiculopathy with fusion of C5-7 in 2005. 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 She endorses concern for chemical exposure related to her hobbies of painting and furniture restoration.  Interval History:   I connected with [name] on 09/23/18 at  3:00 PM EDT by Webex and verified that I am speaking with the correct person using two identifiers.  I discussed the limitations, risks, security and privacy concerns of performing an evaluation and management service by telemedicine and the availability  of in-person appointments. I also discussed with the patient that there may be a patient responsible charge related to this service. The patient expressed understanding and agreed to proceed.   Other persons participating in the visit and their role in the encounter: none  Patient's location: her home Provider's location: my office at the [place] is called today  for management and evaluation of her CML. The patient's last visit with [place] was on 08/22/18. The pt reports that she is doing well overall.  The pt reports that she increased to '300mg'$  Imatinib about one week ago and feels that she had some fatigue and muscle cramps. She notes that she developed a rash across her chest as well as diarrhea, which "immediately resolved" after beginning Claritin and Famotidine. She notes that her muscle cramps have improved but do continue to present intermittently. Notes that some of these pains have moved around to different locations. She notes that the rash began to fade after beginning Loratadine and Famotidine, described as "tiny raised spots, almost likely tiny blisters," which have presented in her life intermittently since childhood. She notes that she has felt worse symptoms in the first week after beginning '100mg'$ , '200mg'$ , and now '300mg'$  Imatinib.  Lab results (09/19/18) of CBC w/diff and CMP is as follows: all values are WNL except for Creatinine at 1.05. 09/23/18 Magnesium at 1.8  On review of systems, pt reports improving rash, improving muscle pains, recently lower energy levels, and denies diarrhea, concerns for infections, and any other symptoms.   MEDICAL HISTORY:  Past Medical History:  Diagnosis Date  . Anxiety   . Arthritis   . Cervical intraepithelial neoplasia grade 3 2002   s/p LEEP Rx repeat pap negative  . Cervical radiculopathy   . CML (chronic myelocytic leukemia) ([place]) 06/03/2018  . Fallopian tube disorder    right fallopian tube removed  . Gallstones 09/2015   On CT  . Headache    Migraines  . Hemorrhagic ovarian cyst 02/08/2016  . Hydrosalpinx    [facility]  . Hydrosalpinx 02/08/2016  . Interstitial cystitis   . Neurological abnormality    Neg workup with MRI/MRA in 2007 WNL except decreased caliber MCA proximally, distal cavernous portion of left LCA which may represent true stenosis or technique on exam. Evaluated by  Dr Linus Salmons.  . Palpitations   . Partial seizures (HCC)    questionable diag  . Pneumonia    walking pneumonia  . Polycystic ovary    multiple ovarian cysts removed  . S/P cervical discectomy    Dr Vertell Limber, anterior discectomy C5-C7  . S/P epidural steroid injection    Right hip  . Seizures ([place]) 2005   simple partial seizure disorder    SURGICAL HISTORY: Past Surgical History:  Procedure Laterality Date  . APPENDECTOMY  1986  . BUNIONECTOMY Left    bunion removal  . CERVICAL DISC SURGERY  2005   C5-C7  . COLONOSCOPY    . DENTAL SURGERY    . fallopian tue removed    . LAPAROSCOPIC BILATERAL SALPINGECTOMY Left 02/09/2016   Procedure: LAPAROSCOPIC LEFT  SALPINGECTOMY;  Surgeon: Janyth Contes, MD;  Location: [facility];  Service: Gynecology;  Laterality: Left;  . LAPAROSCOPIC LYSIS OF ADHESIONS  02/09/2016   Procedure: LAPAROSCOPIC LYSIS OF ADHESIONS;  Surgeon: Janyth Contes, MD;  Location: [facility];  Service: Gynecology;;  momentum to adenexa  . LAPAROSCOPIC OVARIAN CYSTECTOMY Right 02/09/2016   Procedure: LAPAROSCOPIC OVARIAN CYSTECTOMY;  Surgeon: Janyth Contes, MD;  Location: [facility];  Service: Gynecology;  Laterality: Right;  x2 to right ovary  . LEEP  1998  . OOPHORECTOMY Left 02/09/2016   Procedure: LEFT OOPHORECTOMY;  Surgeon: Janyth Contes, MD;  Location: [facility];  Service: Gynecology;  Laterality: Left;  . TONSILLECTOMY  2001  . UPPER GI ENDOSCOPY      SOCIAL HISTORY: Social History   Socioeconomic History  . Marital status: Married    Spouse name: Not on file  . Number of children: 0  . Years of education: Not on file  . Highest education level: Not on file  Occupational History  . 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  . Occupation: Pharmacist, hospital  Social Needs  . Financial resource strain: Not on file  . Food insecurity    Worry: Not on file    Inability: Not on file  . Transportation  needs    Medical: Not on file    Non-medical: Not on file  Tobacco Use  . Smoking status: Former Smoker    Packs/day: 0.00    Years: 0.00    Pack years: 0.00    Types: Cigarettes    Quit date: 11/02/2015    Years since quitting: 2.8  . Smokeless tobacco: Never Used  . Tobacco comment: currently smoking, began at beginning of June and plans to quit on June 14th. Has smoked off and on in the past.  Substance and Sexual Activity  . Alcohol use: No    Alcohol/week: 1.0 - 2.0 standard drinks    Types: 1 - 2 Standard drinks or equivalent per week    Comment: occasionally 0-2 per day  . Drug use: No  . Sexual activity: Yes  Lifestyle  . Physical activity    Days per week: Not on file    Minutes per session: Not on file  . Stress: Not on file  Relationships  . Social Herbalist on phone: Not on file    Gets together: Not on file    Attends religious service: Not on file    Active member of club or organization: Not on file    Attends meetings of clubs or organizations: Not on file    Relationship status: Not on file  . Intimate partner violence    Fear of current or ex partner: Not on file    Emotionally abused: Not on file    Physically abused: Not on file    Forced sexual activity: Not on file  Other Topics Concern  . Not on file  Social History Narrative  . Not on file    FAMILY HISTORY: Family History  Adopted: Yes  Problem Relation Age of Onset  . COPD Mother   . Breast cancer Mother   . Other Mother        Teeth problems- all removed by age 29  . Rashes / Skin problems Sister   . Other Sister        Teeth problems- all removed by age 52  . Rashes / Skin problems Brother   . Asthma Brother   . Other Brother        Teeth problems- all removed by age 70    ALLERGIES:  is allergic to clindamycin/lincomycin; codeine; cyclobenzaprine; gabapentin; nyquil multi-symptom [pseudoeph-doxylamine-dm-apap]; uribel [meth-hyo-m bl-na phos-ph sal]; xanax [alprazolam];  and urelle.  MEDICATIONS:  Current Outpatient Medications  Medication Sig Dispense Refill  . diazepam (VALIUM)  2 MG tablet Take 0.5-1 tablets (1-2 mg total) by mouth every 8 (eight) hours as needed for anxiety. 30 tablet 0  . hydrOXYzine (ATARAX/VISTARIL) 25 MG tablet Take 0.5-1 tablets (12.5-25 mg total) by mouth 3 (three) times daily as needed for nausea or vomiting. (Patient not taking: Reported on 08/28/2018) 30 tablet 0  . imatinib (GLEEVEC) 100 MG tablet Take 3 tablets (300 mg total) by mouth daily. Take with meals and large glass of water.Caution:Chemotherapy 90 tablet 1   No current facility-administered medications for this visit.     REVIEW OF SYSTEMS:    A 10+ Hall REVIEW OF SYSTEMS WAS OBTAINED including neurology, dermatology, psychiatry, cardiac, respiratory, lymph, extremities, GI, GU, Musculoskeletal, constitutional, breasts, reproductive, HEENT.  All pertinent positives are noted in the HPI.  All others are negative.   PHYSICAL EXAMINATION: ECOG PERFORMANCE STATUS: 1 - Symptomatic but completely ambulatory  . There were no vitals filed for this visit. There were no vitals filed for this visit. .There is no height or weight on file to calculate BMI.  Webex visit  LABORATORY DATA:  I have reviewed the data as listed  . CBC Latest Ref Rng & Units 09/19/2018 08/20/2018 07/25/2018  WBC 4.0 - 10.5 K/uL 5.9 9.4 17.0(H)  Hemoglobin 12.0 - 15.0 g/dL 13.3 12.9 13.6  Hematocrit 36.0 - 46.0 % 41.2 39.6 41.3  Platelets 150 - 400 K/uL 225 258 310   . CBC    Component Value Date/Time   WBC 5.9 09/19/2018 0944   RBC 4.77 09/19/2018 0944   HGB 13.3 09/19/2018 0944   HGB 14.1 06/03/2018 0942   HGB 15.1 03/26/2017 1101   HCT 41.2 09/19/2018 0944   HCT 44.0 03/26/2017 1101   PLT 225 09/19/2018 0944   PLT 288 06/03/2018 0942   PLT 330 03/26/2017 1101   MCV 86.4 09/19/2018 0944   MCV 84 03/26/2017 1101   MCH 27.9 09/19/2018 0944   MCHC 32.3 09/19/2018 0944   RDW 13.2  09/19/2018 0944   RDW 14.0 03/26/2017 1101   LYMPHSABS 1.5 09/19/2018 0944   LYMPHSABS 2.8 03/26/2017 1101   MONOABS 0.3 09/19/2018 0944   EOSABS 0.2 09/19/2018 0944   EOSABS 0.4 03/26/2017 1101   BASOSABS 0.1 09/19/2018 0944   BASOSABS 0.2 03/26/2017 1101     . CMP Latest Ref Rng & Units 09/19/2018 08/20/2018 07/25/2018  Glucose 70 - 99 mg/dL 93 115(H) 109(H)  BUN 6 - 20 mg/dL '10 11 12  '$ Creatinine 0.44 - 1.00 mg/dL 1.05(H) 1.00 0.83  Sodium 135 - 145 mmol/L 138 139 139  Potassium 3.5 - 5.1 mmol/L 4.0 4.2 3.8  Chloride 98 - 111 mmol/L 106 106 106  CO2 22 - 32 mmol/L '26 26 24  '$ Calcium 8.9 - 10.3 mg/dL 8.9 9.0 9.0  Total Protein 6.5 - 8.1 g/dL 7.1 6.8 7.2  Total Bilirubin 0.3 - 1.2 mg/dL 0.5 0.5 0.4  Alkaline Phos 38 - 126 U/L 68 48 53  AST 15 - 41 U/L '19 15 17  '$ ALT 0 - 44 U/L '21 20 16   '$ 07/25/18 Initial BCR-ABL:    06/03/18 FISH:   05/16/18 Cytogenetics:   04/10/18 Hemochromatosis Panel:    RADIOGRAPHIC STUDIES: I have personally reviewed the radiological images as listed and agreed with the findings in the report. Dg Chest 2 View  Result Date: 08/28/2018 CLINICAL DATA:  Chronic upper sternal pain. EXAM: CHEST - 2 VIEW; STERNUM - 2+ VIEW COMPARISON:  Chest x-ray dated September 19, 2017. FINDINGS: The  heart size and mediastinal contours are within normal limits. Normal pulmonary vascularity. No focal consolidation, pleural effusion, or pneumothorax. No acute osseous abnormality. No definite sternal fracture. Prior cervical ACDF. IMPRESSION: No definite sternal fracture.  No active cardiopulmonary disease. Electronically Signed   By: Titus Dubin M.D.   On: 08/28/2018 11:46   Dg Sternum  Result Date: 08/28/2018 CLINICAL DATA:  Chronic upper sternal pain. EXAM: CHEST - 2 VIEW; STERNUM - 2+ VIEW COMPARISON:  Chest x-ray dated September 19, 2017. FINDINGS: The heart size and mediastinal contours are within normal limits. Normal pulmonary vascularity. No focal consolidation, pleural  effusion, or pneumothorax. No acute osseous abnormality. No definite sternal fracture. Prior cervical ACDF. IMPRESSION: No definite sternal fracture.  No active cardiopulmonary disease. Electronically Signed   By: Titus Dubin M.D.   On: 08/28/2018 11:46    01/31/18 ECHO Transthoracic:    ASSESSMENT & PLAN:  48 y.o. female with  1. CML- Chronic phase 05/16/18 BM Cytogenetics revealed translocation 9;22. FISH report revealed BCR-ABL gene rearrangement present in 86.5% of cells 06/03/18 FISH revealed BCR-ABL gene rearrangement present in 85.67% of cells, with some hypercellularity around 80% 06/05/18 US Abdomen revealed normal spleen size 01/31/18 ECHO revealed LV EF of 63%, no regional wall motion abnormalities, no significant valvular hear disease  Initial pre-treatment 07/25/18 BCR-ABL revealed IS at 71.1362%  08/28/18 EKG reviewed  2. 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 -The pt has no prohibitive toxicities from continuing '300mg'$  Imatinib at this time. Aiming to continue '300mg'$  for the next month, and then potentially increasing to '400mg'$ . -Continue Loratadine and tapering Famotidine over the next week per symptom control, also in general discussed that PPIs decrease absorption of Imatinib and would not be a recommended long term choice. -Recommend Imodium for diarrhea as well. -Will check BCR-ABL 2-3 months after achieving stable dose, and the goal to have IS less than 10% -Empiric slow release PO Magnesium -Will check Vitamin D and thyroid with next labs -Discussed that I do not believe that many of her unique symptoms are attributable to her new CML diagnosis, and discussed that treating her CML will likely not lead to complete symptomatic relief -Recommend continuing follow up with PCP for evaluation of her symptoms -Will see the pt back in 4  weeks   Labs in 3 weeks Webex visit in 1 month with Dr Irene Limbo   I discussed the assessment and treatment plan with the patient. The patient was provided an opportunity to ask questions and all were answered. The patient agreed with the plan and demonstrated an understanding of the instructions.   The patient was advised to call back or seek an in-person evaluation if the symptoms worsen or if the condition fails to improve as anticipated.  The total time spent in the appt was 30 minutes and more than 50% was on counseling and direct patient cares.    Sullivan Lone MD MS AAHIVMS [facility] Hematology/Oncology Physician [facility]  (Office):       [phone] (Work cell):  [phone] (Fax):           [phone]  09/23/2018 3:15 PM  I, Baldwin Jamaica, am acting as a scribe for Dr. Sullivan Lone.   .I have reviewed the above documentation for accuracy and completeness, and I agree with the above. Brunetta Genera MD

## 2018-09-23 ENCOUNTER — Encounter: Payer: Self-pay | Admitting: General Practice

## 2018-09-23 ENCOUNTER — Inpatient Hospital Stay (HOSPITAL_BASED_OUTPATIENT_CLINIC_OR_DEPARTMENT_OTHER): Payer: BC Managed Care – PPO | Admitting: Hematology

## 2018-09-23 DIAGNOSIS — C9210 Chronic myeloid leukemia, BCR/ABL-positive, not having achieved remission: Secondary | ICD-10-CM | POA: Diagnosis not present

## 2018-09-23 MED ORDER — IMATINIB MESYLATE 100 MG PO TABS: 300.0000 mg | ORAL_TABLET | Freq: Every day | ORAL | 1 refills | Status: DC

## 2018-09-24 ENCOUNTER — Telehealth: Payer: Self-pay | Admitting: Pharmacist

## 2018-09-24 ENCOUNTER — Other Ambulatory Visit: Payer: Self-pay | Admitting: Pharmacist

## 2018-09-24 ENCOUNTER — Telehealth: Payer: Self-pay | Admitting: Hematology

## 2018-09-24 DIAGNOSIS — C9210 Chronic myeloid leukemia, BCR/ABL-positive, not having achieved remission: Secondary | ICD-10-CM

## 2018-09-24 NOTE — Telephone Encounter (Signed)
Scheduled appt per 6/22 los. Left a voice message of appt date and time.

## 2018-09-24 NOTE — Progress Notes (Signed)
[facility] Spiritual Care Note  Victoria Hall by phone for [facility] Support Group check-in/support. She utilized the opportunity well to share and process her learning about her disease and how she is coping. Gardening and journaling are both helping with meaning-making and reflection in the midst of ongoing fatigue and other challenges. Encouraged [place] online support groups as additional means for connection. Victoria Hall has my direct dial number and knows to contact Team whenever desired for further supportive conversation.   [place], [place], [facility] Pager [phone] Voicemail [phone]

## 2018-09-24 NOTE — Telephone Encounter (Signed)
Oral Chemotherapy Pharmacist Encounter   Spoke with patient today to follow up regarding patient's oral chemotherapy medication: Gleevec (imatinib) treatment of CML, planned durationuntil disease progression or unacceptable drug toxicity.   Original Start date of oral chemotherapy: 07/01/2018 Patient has been initiated on a slow up titration of her Gleevec 100 mg tablets. She increased to Gleevec 200 mg once daily on 09/02/2018 She increased to [place] 300 mg once daily on 09/16/2018  Pt reports 0 tablets/doses of Gleevec on slow up titration missed in the last month.   Patient will remain on imatinib 100 mg tablets, 3 tablets (300 mg) taken by mouth once daily with a meal and a glass of water for at least the next month. Her dose may eventually increase to imatinib 400 mg once daily pending efficacy and toleration in the future.  Patient endorses nausea, diarrhea, joint pain, and muscle cramping that continue to occur with Gleevec administration. She experienced these when she initiated Gleevec at 100 mg once daily and they started to subside, however, she endorses that every time she increases her dose the side effects return with an increase in severity. She is currently managing them with an increase in fluid intake, famotidine 20 mg once daily, and loratadine 10 mg once daily. She notes that when she initiated her histamine blockers that all of her side effects decreased. Patient informed that she can safely take famotidine 20 mg by mouth twice daily and loratadine 10 mg by mouth twice daily, she does state that when she takes her histamine blockers in the morning she notices a decrease in their efficacy by the late afternoon and early evening. There is no noted issue with acid suppression and absorption of the Gleevec and the [employer] or the prescribing information for Gleevec.  Pertinent labs reviewed: Okay for continued treatment, electrolytes will remain WNL.  All questions  answered. Patient knows to call the office with questions or concerns.  Johny Drilling, PharmD, BCPS, BCOP  09/24/2018 10:01 AM Oral Oncology Clinic [phone]

## 2018-09-27 ENCOUNTER — Telehealth: Payer: Self-pay | Admitting: *Deleted

## 2018-09-27 NOTE — Telephone Encounter (Signed)
Patient called to verify Dr. Irene Limbo sent in prescription for her Gleevec 300mg /day  to [facility]. Confirmed with patient/pharmacy noted receipt of prescription on 6/22.

## 2018-10-02 DIAGNOSIS — F4312 Post-traumatic stress disorder, chronic: Secondary | ICD-10-CM | POA: Diagnosis not present

## 2018-10-03 ENCOUNTER — Telehealth: Payer: Self-pay | Admitting: Hematology

## 2018-10-03 NOTE — Telephone Encounter (Signed)
Returned patient's phone call regarding rescheduling 07/13 appointment, left a voicemail.

## 2018-10-07 DIAGNOSIS — F4312 Post-traumatic stress disorder, chronic: Secondary | ICD-10-CM | POA: Diagnosis not present

## 2018-10-14 ENCOUNTER — Inpatient Hospital Stay: Payer: BC Managed Care – PPO | Attending: Family

## 2018-10-14 ENCOUNTER — Other Ambulatory Visit: Payer: Self-pay

## 2018-10-14 ENCOUNTER — Other Ambulatory Visit: Payer: BC Managed Care – PPO

## 2018-10-14 DIAGNOSIS — C9210 Chronic myeloid leukemia, BCR/ABL-positive, not having achieved remission: Secondary | ICD-10-CM | POA: Diagnosis not present

## 2018-10-14 DIAGNOSIS — Z79899 Other long term (current) drug therapy: Secondary | ICD-10-CM | POA: Diagnosis not present

## 2018-10-14 LAB — CMP (CANCER CENTER ONLY)
ALT: 23 U/L (ref 0–44)
AST: 20 U/L (ref 15–41)
Albumin: 4.5 g/dL (ref 3.5–5.0)
Alkaline Phosphatase: 77 U/L (ref 38–126)
Anion gap: 10 (ref 5–15)
BUN: 10 mg/dL (ref 6–20)
CO2: 27 mmol/L (ref 22–32)
Calcium: 9.2 mg/dL (ref 8.9–10.3)
Chloride: 104 mmol/L (ref 98–111)
Creatinine: 1.04 mg/dL — ABNORMAL HIGH (ref 0.44–1.00)
GFR, Est AFR Am: >60 mL/min (ref >60)
GFR, Estimated: >60 mL/min (ref >60)
Glucose, Bld: 106 mg/dL — ABNORMAL HIGH (ref 70–99)
Potassium: 4.5 mmol/L (ref 3.5–5.1)
Sodium: 141 mmol/L (ref 135–145)
Total Bilirubin: 0.4 mg/dL (ref 0.3–1.2)
Total Protein: 7.7 g/dL (ref 6.5–8.1)

## 2018-10-14 LAB — TSH: TSH: 2.345 u[IU]/mL (ref 0.308–3.960)

## 2018-10-14 LAB — CBC WITH DIFFERENTIAL/PLATELET
Abs Immature Granulocytes: 0.02 10*3/uL (ref 0.00–0.07)
Basophils Absolute: 0.1 10*3/uL (ref 0.0–0.1)
Basophils Relative: 1 %
Eosinophils Absolute: 0.2 10*3/uL (ref 0.0–0.5)
Eosinophils Relative: 4 %
HCT: 42.9 % (ref 36.0–46.0)
Hemoglobin: 14.2 g/dL (ref 12.0–15.0)
Immature Granulocytes: 0 %
Lymphocytes Relative: 29 %
Lymphs Abs: 1.8 10*3/uL (ref 0.7–4.0)
MCH: 28.3 pg (ref 26.0–34.0)
MCHC: 33.1 g/dL (ref 30.0–36.0)
MCV: 85.5 fL (ref 80.0–100.0)
Monocytes Absolute: 0.4 10*3/uL (ref 0.1–1.0)
Monocytes Relative: 6 %
Neutro Abs: 3.7 10*3/uL (ref 1.7–7.7)
Neutrophils Relative %: 60 %
Platelets: 242 10*3/uL (ref 150–400)
RBC: 5.02 MIL/uL (ref 3.87–5.11)
RDW: 13.5 % (ref 11.5–15.5)
WBC: 6.2 10*3/uL (ref 4.0–10.5)
nRBC: 0 % (ref 0.0–0.2)

## 2018-10-14 LAB — MAGNESIUM: Magnesium: 1.9 mg/dL (ref 1.7–2.4)

## 2018-10-14 LAB — T4, FREE: Free T4: 0.94 ng/dL (ref 0.61–1.12)

## 2018-10-15 LAB — VITAMIN D 25 HYDROXY (VIT D DEFICIENCY, FRACTURES): Vit D, 25-Hydroxy: 34.6 ng/mL (ref 30.0–100.0)

## 2018-10-23 ENCOUNTER — Telehealth: Payer: Self-pay | Admitting: Hematology

## 2018-10-23 NOTE — Telephone Encounter (Signed)
Called patient regarding upcoming Webex appointment, patient is notified and e-mail has been sent. °

## 2018-10-23 NOTE — Progress Notes (Signed)
HEMATOLOGY/ONCOLOGY CONSULTATION NOTE  Date of Service: 10/24/2018  Patient Care Team: Copland, Gay Filler, MD as PCP - General (Family Medicine)  CHIEF COMPLAINTS/PURPOSE OF CONSULTATION:  Chronic Myeloid Leukemia  HISTORY OF PRESENTING ILLNESS:   Victoria Hall is a wonderful 48 y.o. female who has been referred to [place] by Dr. Silvestre Mesi for evaluation and management of CML. The pt was formerly under the care of my colleague Dr. Burney Gauze, and has transferred her care closer to home. The pt reports that she is doing well overall.  Prior to today's visit, the pt was recently diagnosed with CML after a 05/16/18 bone marrow biopsy revealed a translocation 9;22 and FISH study revealed BCR-ABL gene rearranagement in 85% of cells. The pt was prescribed '400mg'$  Imatinib with Dr. Marin Olp on 06/03/18.  The pt reports that she had intermittent blurry vision for the last 4 years. She notes that she had night sweats for the last 20 years. She notes that her WBC have been elevated for the last 4 years. She notes that she has had elevated Basophils since April 2018. She has been evaluated for mast cell activation, and has had normal tryptase levels, including most recently on 06/03/18.  She reports that she went to the [facility] in October 2019 for suspicions of dysautonomia. The pt notes that she has "exercise intolerance," for the last 4 years, which she describes as needing to "lie on the couch for 2 weeks," after taking a run.  She has had intermittent rashes and hives for the last 20 years, which do resolve on their own. She keeps a food and activity journal for the last 2 years, and denies awareness of any associations with her rash onset. She has seen dermatology and immunology regarding this. The pt has also seen a rheumatologist for evaluation.  The pt notes that her hands and feet frequently fall asleep, and has been evaluated by neurology with an NCV. She notes that she has cervical  radiculopathy which she relates to her 2005 C5-7 fusion. She endorses exhaustion which "comes on out of no where." She notes her energy levels change "from minute to minute."   The pt denies abdominal pains or mouth sores.   The pt endorse frequent muscle and joint cramping as well. She has tried using CBD oil which she found to be helpful, but notes that the cost became prohibitive.  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  Most recent lab results (06/03/18) of CBC w/diff and CMP is as follows: all values are WNL except for WBC at 18.8k, ANC at 12.9k Basophils abs at 600, Abs immature granulocytes at 1.02k 06/24/18 LDH at 237  On review of systems, pt reports intermittent sudden-onset fatigue, intermittent headaches, intermittent hives and rashes, muscle and joint cramping, and denies abdominal pains, mouth sores, leg swelling, and any other symptoms.   On PMHx the pt reports chronic cervical radiculopathy with fusion of C5-7 in 2005. 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 She endorses concern for chemical exposure related to her hobbies of painting and furniture restoration.  Interval History:   I connected with Victoria Hall on 10/24/2018 at  3:00 PM EDT by Webex and verified that I am speaking with the correct person using two identifiers.  I discussed the limitations, risks, security and privacy concerns of performing an evaluation and management service by telemedicine and the availability  of in-person appointments. I also discussed with the patient that there may be a patient responsible charge related to this service. The patient expressed understanding and agreed to proceed.   Other persons participating in the visit and their role in the encounter: her husband  Patient's location: her home Provider's location: my office at the [place] is  called today for management and evaluation of her CML. The patient's last visit with [place] was on 09/23/2018.    The pt reports hives across her chest, forearms, behind her ears, hairline, and stomach, but they have improved. However, these rashes have been present for years and she has seen a dermatologist and an allergist. The overall consensus is that she has a mast cell activation disorder. She has tried sodium chromoglycate, but it caused a burning feeling in her veins.  She noticed a significant increase in fatigue after increasing her dose of Imatinib from 200 to '300mg'$ . She also noticed an increase in muscle spasms and face swelling. They symptoms are better when she is taking Loraditine and Famoditine. She has tried to stop taking antihistamines to see if it is causing her rash but it results in severe diarrhea and muscle spasms.  Most recent labs from 10/14/2018 of CBC w/ diff and CMP show: all values WNL except glucose bld at 106 and Creatinine at 1.04 10/14/2018 BCR-ABL is pending 10/14/2018 Magnesium at 1.9 10/14/2018 Vit D at 34.6 10/14/2018 T4 at 0.94, TSH at 2.345  On review of systems, pt reports itchy rash across her chest, forearms, hairline, and denies and any other symptoms.   MEDICAL HISTORY:  Past Medical History:  Diagnosis Date  . Anxiety   . Arthritis   . Cervical intraepithelial neoplasia grade 3 2002   s/p LEEP Rx repeat pap negative  . Cervical radiculopathy   . CML (chronic myelocytic leukemia) (Grandfather) 06/03/2018  . Fallopian tube disorder    right fallopian tube removed  . Gallstones 09/2015   On CT  . Headache    Migraines  . Hemorrhagic ovarian cyst 02/08/2016  . Hydrosalpinx    [facility]  . Hydrosalpinx 02/08/2016  . Interstitial cystitis   . Neurological abnormality    Neg workup with MRI/MRA in 2007 WNL except decreased caliber MCA proximally, distal cavernous portion of left LCA which may represent true stenosis or technique on exam.  Evaluated by Dr Linus Salmons.  . Palpitations   . Partial seizures (HCC)    questionable diag  . Pneumonia    walking pneumonia  . Polycystic ovary    multiple ovarian cysts removed  . S/P cervical discectomy    Dr Vertell Limber, anterior discectomy C5-C7  . S/P epidural steroid injection    Right hip  . Seizures (Grandfather) 2005   simple partial seizure disorder    SURGICAL HISTORY: Past Surgical History:  Procedure Laterality Date  . APPENDECTOMY  1986  . BUNIONECTOMY Left    bunion removal  . CERVICAL DISC SURGERY  2005   C5-C7  . COLONOSCOPY    . DENTAL SURGERY    . fallopian tue removed    . LAPAROSCOPIC BILATERAL SALPINGECTOMY Left 02/09/2016   Procedure: LAPAROSCOPIC LEFT  SALPINGECTOMY;  Surgeon: Janyth Contes, MD;  Location: [facility];  Service: Gynecology;  Laterality: Left;  . LAPAROSCOPIC LYSIS OF ADHESIONS  02/09/2016   Procedure: LAPAROSCOPIC LYSIS OF ADHESIONS;  Surgeon: Janyth Contes, MD;  Location: [facility];  Service: Gynecology;;  momentum to adenexa  .  LAPAROSCOPIC OVARIAN CYSTECTOMY Right 02/09/2016   Procedure: LAPAROSCOPIC OVARIAN CYSTECTOMY;  Surgeon: Janyth Contes, MD;  Location: [facility];  Service: Gynecology;  Laterality: Right;  x2 to right ovary  . LEEP  1998  . OOPHORECTOMY Left 02/09/2016   Procedure: LEFT OOPHORECTOMY;  Surgeon: Janyth Contes, MD;  Location: [facility];  Service: Gynecology;  Laterality: Left;  . TONSILLECTOMY  2001  . UPPER GI ENDOSCOPY      SOCIAL HISTORY: Social History   Socioeconomic History  . Marital status: Married    Spouse name: Not on file  . Number of children: 0  . Years of education: Not on file  . Highest education level: Not on file  Occupational History  . 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  . Occupation: Pharmacist, hospital  Social Needs  . Financial resource strain: Not on file  . Food insecurity    Worry: Not on file    Inability: Not on file  .  Transportation needs    Medical: Not on file    Non-medical: Not on file  Tobacco Use  . Smoking status: Former Smoker    Packs/day: 0.00    Years: 0.00    Pack years: 0.00    Types: Cigarettes    Quit date: 11/02/2015    Years since quitting: 2.9  . Smokeless tobacco: Never Used  . Tobacco comment: currently smoking, began at beginning of June and plans to quit on June 14th. Has smoked off and on in the past.  Substance and Sexual Activity  . Alcohol use: No    Alcohol/week: 1.0 - 2.0 standard drinks    Types: 1 - 2 Standard drinks or equivalent per week    Comment: occasionally 0-2 per day  . Drug use: No  . Sexual activity: Yes  Lifestyle  . Physical activity    Days per week: Not on file    Minutes per session: Not on file  . Stress: Not on file  Relationships  . Social Herbalist on phone: Not on file    Gets together: Not on file    Attends religious service: Not on file    Active member of club or organization: Not on file    Attends meetings of clubs or organizations: Not on file    Relationship status: Not on file  . Intimate partner violence    Fear of current or ex partner: Not on file    Emotionally abused: Not on file    Physically abused: Not on file    Forced sexual activity: Not on file  Other Topics Concern  . Not on file  Social History Narrative  . Not on file    FAMILY HISTORY: Family History  Adopted: Yes  Problem Relation Age of Onset  . COPD Mother   . Breast cancer Mother   . Other Mother        Teeth problems- all removed by age 41  . Rashes / Skin problems Sister   . Other Sister        Teeth problems- all removed by age 13  . Rashes / Skin problems Brother   . Asthma Brother   . Other Brother        Teeth problems- all removed by age 51    ALLERGIES:  is allergic to clindamycin/lincomycin; codeine; cyclobenzaprine; gabapentin; nyquil multi-symptom [pseudoeph-doxylamine-dm-apap]; uribel [meth-hyo-m bl-na phos-ph sal];  xanax [alprazolam]; and urelle.  MEDICATIONS:  Current Outpatient Medications  Medication Sig Dispense Refill  . diazepam (VALIUM) 2 MG tablet Take 0.5-1 tablets (1-2 mg total) by mouth every 8 (eight) hours as needed for anxiety. 30 tablet 0  . famotidine (PEPCID) 20 MG tablet Take 20 mg by mouth 2 (two) times daily as needed (management of imatinib ADEs: nausea, diarrhea, muscle and joint pain).    . hydrOXYzine (ATARAX/VISTARIL) 25 MG tablet Take 0.5-1 tablets (12.5-25 mg total) by mouth 3 (three) times daily as needed for nausea or vomiting. (Patient not taking: Reported on 08/28/2018) 30 tablet 0  . imatinib (GLEEVEC) 100 MG tablet Take 3 tablets (300 mg total) by mouth daily. Take with meals and large glass of water.Caution:Chemotherapy 90 tablet 1  . loratadine (CLARITIN) 10 MG tablet Take 10 mg by mouth 2 (two) times daily as needed (management of imatinib ADEs: nausea, diarrhea, muscle and joint pain).     No current facility-administered medications for this visit.     REVIEW OF SYSTEMS:    A 10+ POINT REVIEW OF SYSTEMS WAS OBTAINED including neurology, dermatology, psychiatry, cardiac, respiratory, lymph, extremities, GI, GU, Musculoskeletal, constitutional, breasts, reproductive, HEENT.  All pertinent positives are noted in the HPI.  All others are negative.   PHYSICAL EXAMINATION: ECOG PERFORMANCE STATUS: 1 - Symptomatic but completely ambulatory  . There were no vitals filed for this visit. There were no vitals filed for this visit. .There is no height or weight on file to calculate BMI.  Webex visit  LABORATORY DATA:  I have reviewed the data as listed  Component     Latest Ref Rng & Units 10/14/2018  WBC     4.0 - 10.5 K/uL 6.2  RBC     3.87 - 5.11 MIL/uL 5.02  Hemoglobin     12.0 - 15.0 g/dL 14.2  HCT     36.0 - 46.0 % 42.9  MCV     80.0 - 100.0 fL 85.5  MCH     26.0 - 34.0 pg 28.3  MCHC     30.0 - 36.0 g/dL 33.1  RDW     11.5 - 15.5 % 13.5  Platelets      150 - 400 K/uL 242  nRBC     0.0 - 0.2 % 0.0  Neutrophils     % 60  NEUT#     1.7 - 7.7 K/uL 3.7  Lymphocytes     % 29  Lymphocyte #     0.7 - 4.0 K/uL 1.8  Monocytes Relative     % 6  Monocyte #     0.1 - 1.0 K/uL 0.4  Eosinophil     % 4  Eosinophils Absolute     0.0 - 0.5 K/uL 0.2  Basophil     % 1  Basophils Absolute     0.0 - 0.1 K/uL 0.1  Immature Granulocytes     % 0  Abs Immature Granulocytes     0.00 - 0.07 K/uL 0.02  Sodium     135 - 145 mmol/L 141  Potassium     3.5 - 5.1 mmol/L 4.5  Chloride     98 - 111 mmol/L 104  CO2     22 - 32 mmol/L 27  Glucose     70 - 99 mg/dL 106 (H)  BUN     6 - 20 mg/dL 10  Creatinine     0.44 - 1.00 mg/dL 1.04 (H)  Calcium     8.9 - 10.3  mg/dL 9.2  Total Protein     6.5 - 8.1 g/dL 7.7  Albumin     3.5 - 5.0 g/dL 4.5  AST     15 - 41 U/L 20  ALT     0 - 44 U/L 23  Alkaline Phosphatase     38 - 126 U/L 77  Total Bilirubin     0.3 - 1.2 mg/dL 0.4  GFR, Est Non African American     >60 mL/min >60  GFR, Est African American     >60 mL/min >60  Anion gap     5 - 15 10  T4,Free(Direct)     0.61 - 1.12 ng/dL 0.94  TSH     0.308 - 3.960 uIU/mL 2.345  Magnesium     1.7 - 2.4 mg/dL 1.9  Vitamin D, 25-Hydroxy     30.0 - 100.0 ng/mL 34.6   . CBC Latest Ref Rng & Units 10/14/2018 09/19/2018 08/20/2018  WBC 4.0 - 10.5 K/uL 6.2 5.9 9.4  Hemoglobin 12.0 - 15.0 g/dL 14.2 13.3 12.9  Hematocrit 36.0 - 46.0 % 42.9 41.2 39.6  Platelets 150 - 400 K/uL 242 225 258   . CBC    Component Value Date/Time   WBC 6.2 10/14/2018 0837   RBC 5.02 10/14/2018 0837   HGB 14.2 10/14/2018 0837   HGB 14.1 06/03/2018 0942   HGB 15.1 03/26/2017 1101   HCT 42.9 10/14/2018 0837   HCT 44.0 03/26/2017 1101   PLT 242 10/14/2018 0837   PLT 288 06/03/2018 0942   PLT 330 03/26/2017 1101   MCV 85.5 10/14/2018 0837   MCV 84 03/26/2017 1101   MCH 28.3 10/14/2018 0837   MCHC 33.1 10/14/2018 0837   RDW 13.5 10/14/2018 0837   RDW 14.0  03/26/2017 1101   LYMPHSABS 1.8 10/14/2018 0837   LYMPHSABS 2.8 03/26/2017 1101   MONOABS 0.4 10/14/2018 0837   EOSABS 0.2 10/14/2018 0837   EOSABS 0.4 03/26/2017 1101   BASOSABS 0.1 10/14/2018 0837   BASOSABS 0.2 03/26/2017 1101     . CMP Latest Ref Rng & Units 10/14/2018 09/19/2018 08/20/2018  Glucose 70 - 99 mg/dL 106(H) 93 115(H)  BUN 6 - 20 mg/dL '10 10 11  '$ Creatinine 0.44 - 1.00 mg/dL 1.04(H) 1.05(H) 1.00  Sodium 135 - 145 mmol/L 141 138 139  Potassium 3.5 - 5.1 mmol/L 4.5 4.0 4.2  Chloride 98 - 111 mmol/L 104 106 106  CO2 22 - 32 mmol/L '27 26 26  '$ Calcium 8.9 - 10.3 mg/dL 9.2 8.9 9.0  Total Protein 6.5 - 8.1 g/dL 7.7 7.1 6.8  Total Bilirubin 0.3 - 1.2 mg/dL 0.4 0.5 0.5  Alkaline Phos 38 - 126 U/L 77 68 48  AST 15 - 41 U/L '20 19 15  '$ ALT 0 - 44 U/L '23 21 20   '$ 07/25/18 Initial BCR-ABL:    06/03/18 FISH:   05/16/18 Cytogenetics:   04/10/18 Hemochromatosis Panel:    RADIOGRAPHIC STUDIES: I have personally reviewed the radiological images as listed and agreed with the findings in the report. No results found.  01/31/18 ECHO Transthoracic:    ASSESSMENT & PLAN:  48 y.o. female with  1. CML- Chronic phase 05/16/18 BM Cytogenetics revealed translocation 9;22. FISH report revealed BCR-ABL gene rearrangement present in 86.5% of cells 06/03/18 FISH revealed BCR-ABL gene rearrangement present in 85.67% of cells, with some hypercellularity around 80% 06/05/18 US Abdomen revealed normal spleen size 01/31/18 ECHO revealed LV EF of 63%, no regional wall motion abnormalities,  no significant valvular hear disease  Initial pre-treatment 07/25/18 BCR-ABL revealed IS at 71.1362%  08/28/18 EKG reviewed  2. 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 Goal is to decrease to 1%  after 6 months and 0.1% after 1 year -Continue taking Imatinib at '300mg'$ . No rush to increase dosage because blood counts are normal and she is having fatigue and some diarrhea. -The pt has no prohibitive toxicities from continuing '300mg'$  Imatinib at this time. Aiming to continue '300mg'$  for now, and then potentially increase to '400mg'$  after 6 months depending on tolerance and BCR goals. -Recommend continuing follow up with PCP for evaluation of her symptoms -If rash persists, consider leukotriene inhibitors, chromolyn sodium, or Singulair at nighttime -Will see the pt back in 6 weeks -Repeat BCR-ABL in 12 weeks   Labs in 6 weeks Labs in 12 weeks Phone visit with Dr Irene Limbo in 14 weeks   I discussed the assessment and treatment plan with the patient. The patient was provided an opportunity to ask questions and all were answered. The patient agreed with the plan and demonstrated an understanding of the instructions.   The patient was advised to call back or seek an in-person evaluation if the symptoms worsen or if the condition fails to improve as anticipated.  The total time spent in the appt was 30 minutes and more than 50% was on counseling and direct patient cares.  Sullivan Lone MD [place] AAHIV[facility] Hematology/Oncology Physician [facility]  (Office):       [phone] (Work cell):  [phone] (Fax):           [phone]  10/24/2018 4:35 PM  I, De Burrs, am acting as a scribe for Dr. [facility] Limbo  .I have reviewed the above documentation for accuracy and completeness, and I agree with the above. Brunetta Genera MD

## 2018-10-24 ENCOUNTER — Inpatient Hospital Stay (HOSPITAL_BASED_OUTPATIENT_CLINIC_OR_DEPARTMENT_OTHER): Payer: BC Managed Care – PPO | Admitting: Hematology

## 2018-10-24 DIAGNOSIS — Z79899 Other long term (current) drug therapy: Secondary | ICD-10-CM | POA: Diagnosis not present

## 2018-10-24 DIAGNOSIS — C9210 Chronic myeloid leukemia, BCR/ABL-positive, not having achieved remission: Secondary | ICD-10-CM | POA: Diagnosis not present

## 2018-10-25 ENCOUNTER — Telehealth: Payer: Self-pay | Admitting: Hematology

## 2018-10-25 ENCOUNTER — Encounter: Payer: Self-pay | Admitting: *Deleted

## 2018-10-25 ENCOUNTER — Telehealth: Payer: Self-pay | Admitting: *Deleted

## 2018-10-25 DIAGNOSIS — F0634 Mood disorder due to known physiological condition with mixed features: Secondary | ICD-10-CM | POA: Diagnosis not present

## 2018-10-25 NOTE — Telephone Encounter (Signed)
Scheduled appt per 7/23 los.  Spoke with patient and she is aware of her appt date and time.

## 2018-10-25 NOTE — Telephone Encounter (Signed)
Letter requested by patient stating: who her provider is, when she was diagnosed/began treatment and how long treatment is expected to last. Letter in chart. Printed and signed by Dr. [facility] Limbo.  Letter printed, placed in envelope at reception desk of [facility] for pick up. Contacted patient/spouse and stated letter ready to be picked up at reception desk. They verbalized understanding.

## 2018-10-29 ENCOUNTER — Encounter: Payer: Self-pay | Admitting: Hematology

## 2018-10-30 ENCOUNTER — Telehealth: Payer: Self-pay | Admitting: *Deleted

## 2018-10-30 NOTE — Telephone Encounter (Signed)
Patient left voice mail:  Was recommended by Dr. [facility] Limbo to take Vitamin D3. She took the recommended dose on the bottle she purchased - 2 Gummies = 5000IU (157mcg). She is concerned that the dose is too high and asked if it is too much for every day.  Attempted to contact patient by phone with Dr. Grier Mitts response: "this dose is ok -  we shall be rechecking her levels to adjust dose." Unable to leave VM - mailbox full.

## 2018-10-31 ENCOUNTER — Telehealth: Payer: Self-pay | Admitting: *Deleted

## 2018-10-31 ENCOUNTER — Encounter: Payer: Self-pay | Admitting: Family Medicine

## 2018-10-31 MED ORDER — TRIAMCINOLONE ACETONIDE 0.1 % EX CREA: 1.0000 "application " | TOPICAL_CREAM | Freq: Two times a day (BID) | CUTANEOUS | 1 refills | Status: DC | PRN

## 2018-10-31 NOTE — Telephone Encounter (Signed)
Contacted patient with Dr. [facility] Limbo response to her 10/29/2018 MyChart message regarding rash: "If the patient is having significantly worsening rash she needs to hold the Imatinib immediately. She needs to see her dermtaologist ASAP to get her acute or chronic rashunder control.  Once rash abates can discuss about switch to gleevac (brand name drug)."  Patient states rash is not significantly worse, just unchanged. She does not plan to stop Imatinib at this time.  She has not seen dermatologist in past year so she is not sure they will see her quickly. Patient plans on calling herPCP Dr. Lorelei Pont  for evaluation of rash.  She stated that since she had a rash, she has stopped her supplements, although she noted less nighttime leg cramps while taking magnesium, so may restart that. She continues to take Loratadine and Famotidine every morning.  Patient requested results of BCR-ABL be faxed to Dr. Bryna Colander at [facility].  Patient verbalized understanding of all information.

## 2018-10-31 NOTE — Telephone Encounter (Signed)
Contacted patient with Dr. [facility] Limbo response and information noted in previous note r/t Vitamin D3. Patient verbalized understanding.

## 2018-11-04 ENCOUNTER — Telehealth: Payer: Self-pay | Admitting: *Deleted

## 2018-11-04 ENCOUNTER — Other Ambulatory Visit: Payer: Self-pay | Admitting: Hematology

## 2018-11-04 DIAGNOSIS — C9210 Chronic myeloid leukemia, BCR/ABL-positive, not having achieved remission: Secondary | ICD-10-CM

## 2018-11-04 NOTE — Telephone Encounter (Signed)
At patient's request, faxed BCR-ABL results from 10/14/2018 to Dr. Leretha Pol at [facility] (ph [phone] - fax [phone]). Fax confirmation received.

## 2018-11-05 ENCOUNTER — Telehealth: Payer: Self-pay | Admitting: *Deleted

## 2018-11-05 NOTE — Telephone Encounter (Signed)
Contacted patient to inform that copy of most recent BCR-ABL (7/13) results was faxed to Dr. Leretha Pol at [facility] as she had requested. Faxed to [phone]/OER confirmation received. As lab result not yet available in Epic, mailed a copy of same to patient.

## 2018-11-11 ENCOUNTER — Telehealth: Payer: Self-pay | Admitting: *Deleted

## 2018-11-11 ENCOUNTER — Encounter: Payer: Self-pay | Admitting: Gastroenterology

## 2018-11-11 NOTE — Telephone Encounter (Signed)
Records faxed to [facility] - Release [id number]

## 2018-11-12 ENCOUNTER — Telehealth: Payer: Self-pay | Admitting: *Deleted

## 2018-11-12 LAB — BCR/ABL

## 2018-11-12 NOTE — Telephone Encounter (Signed)
Spouse Mackey Birchwood called office. Letter provided on October 24, 2018 had on only month and year, they were notified by loan administrator that the specific date of patient diagnosis and date of start of treatment was needed. Original letter only had month and year. New letter prepared with addition of specific dates mailed today.

## 2018-11-14 DIAGNOSIS — D225 Melanocytic nevi of trunk: Secondary | ICD-10-CM | POA: Diagnosis not present

## 2018-11-14 DIAGNOSIS — R21 Rash and other nonspecific skin eruption: Secondary | ICD-10-CM | POA: Diagnosis not present

## 2018-11-14 DIAGNOSIS — D224 Melanocytic nevi of scalp and neck: Secondary | ICD-10-CM | POA: Diagnosis not present

## 2018-11-14 DIAGNOSIS — L218 Other seborrheic dermatitis: Secondary | ICD-10-CM | POA: Diagnosis not present

## 2018-11-22 DIAGNOSIS — C9210 Chronic myeloid leukemia, BCR/ABL-positive, not having achieved remission: Secondary | ICD-10-CM | POA: Diagnosis not present

## 2018-11-25 IMAGING — MG 2D DIGITAL SCREENING BILATERAL MAMMOGRAM WITH CAD AND ADJUNCT TO
8 of 12 series · 8 of 28 positions shown · non-contrast
Comparison: Previous exam(s).

CLINICAL DATA: Screening.

EXAM:
2D DIGITAL SCREENING BILATERAL MAMMOGRAM WITH CAD AND ADJUNCT TOMO

[R MLO]
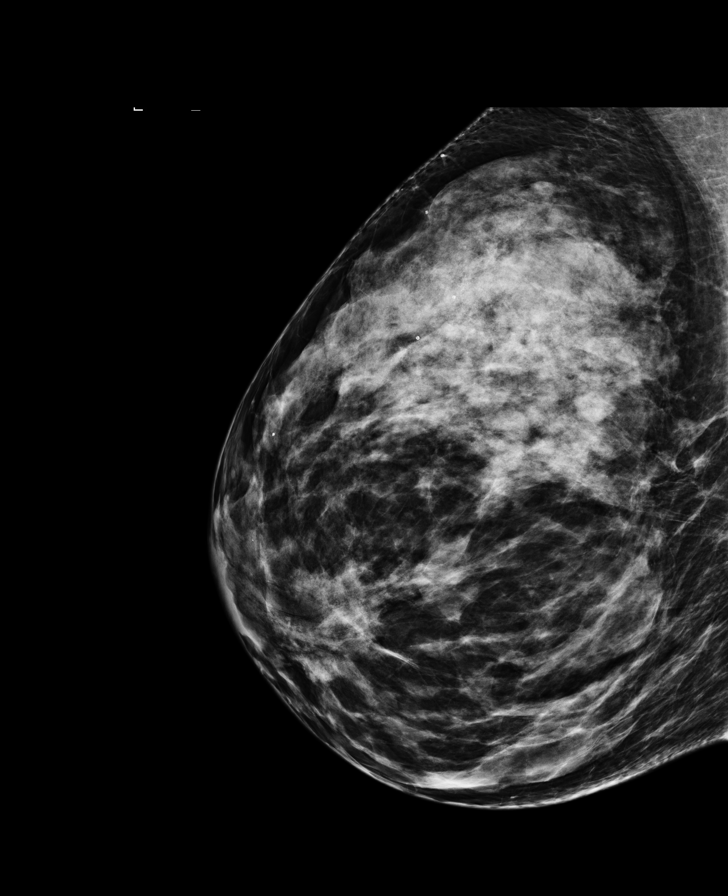

[L MLO]
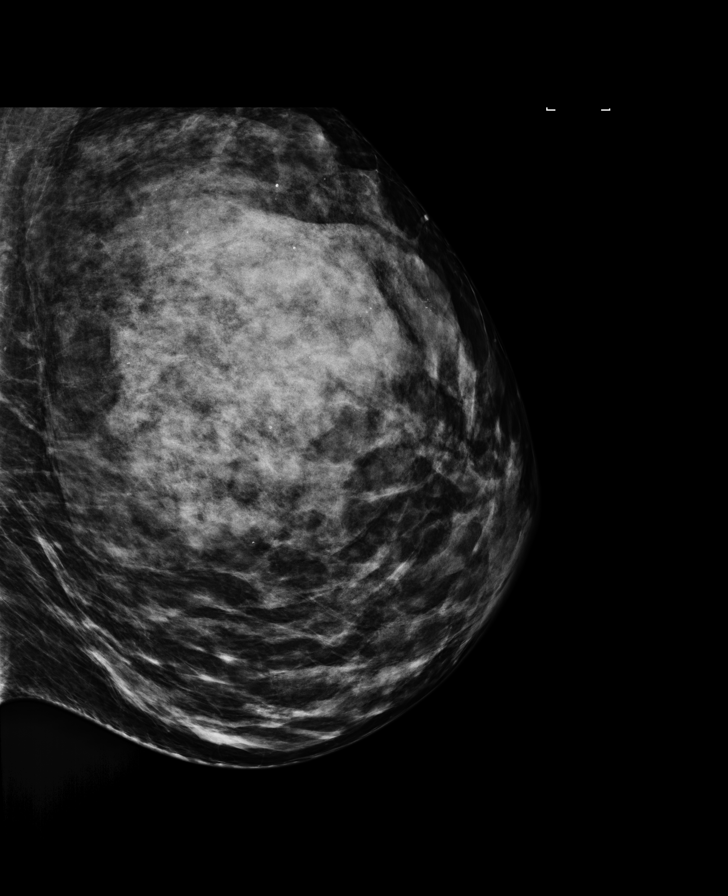

[L CC synth-2D]
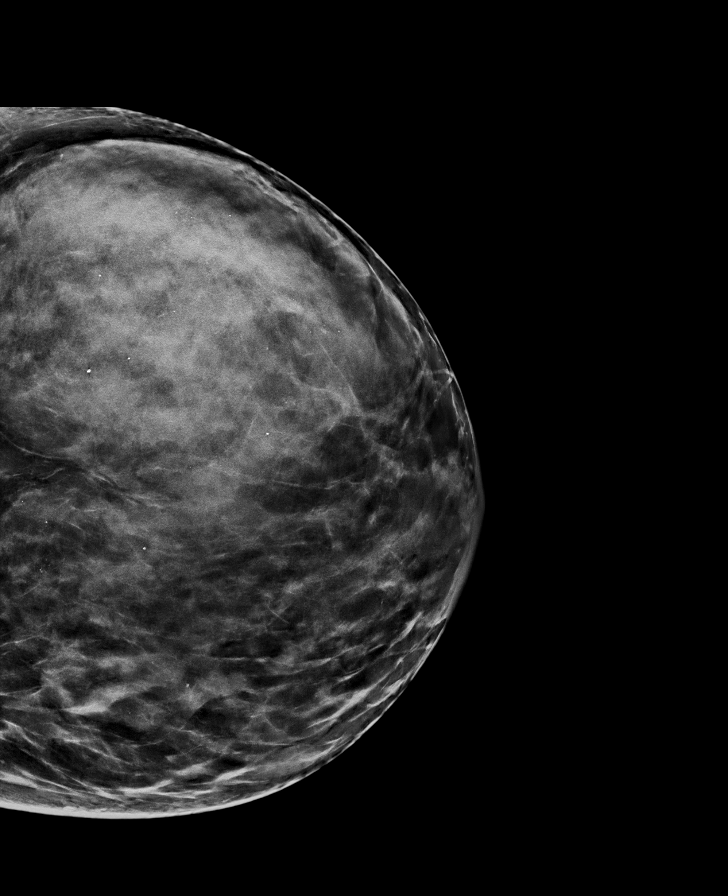

[R MLO synth-2D]
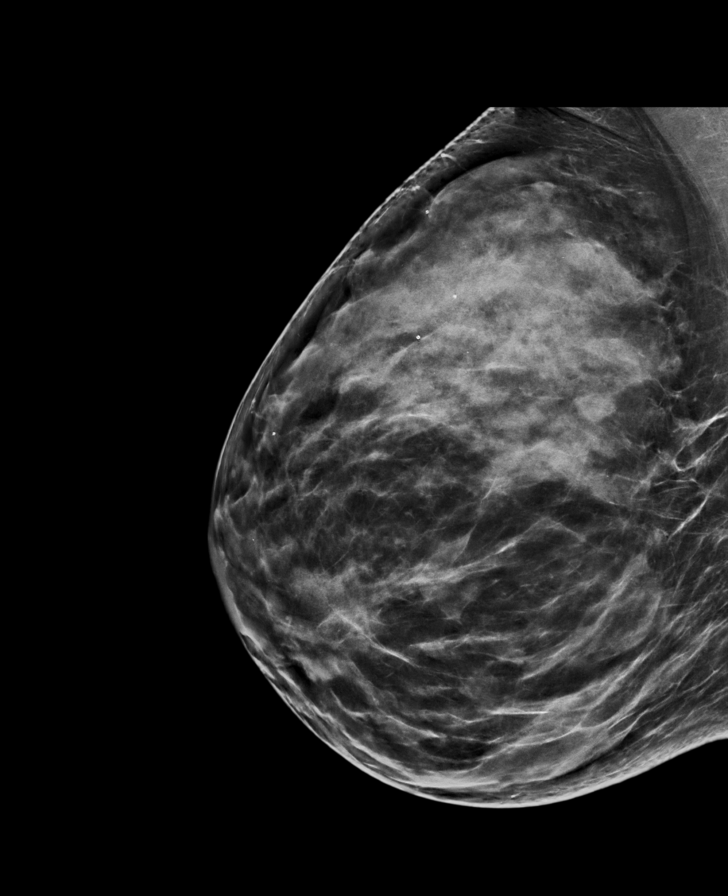

[R CC]
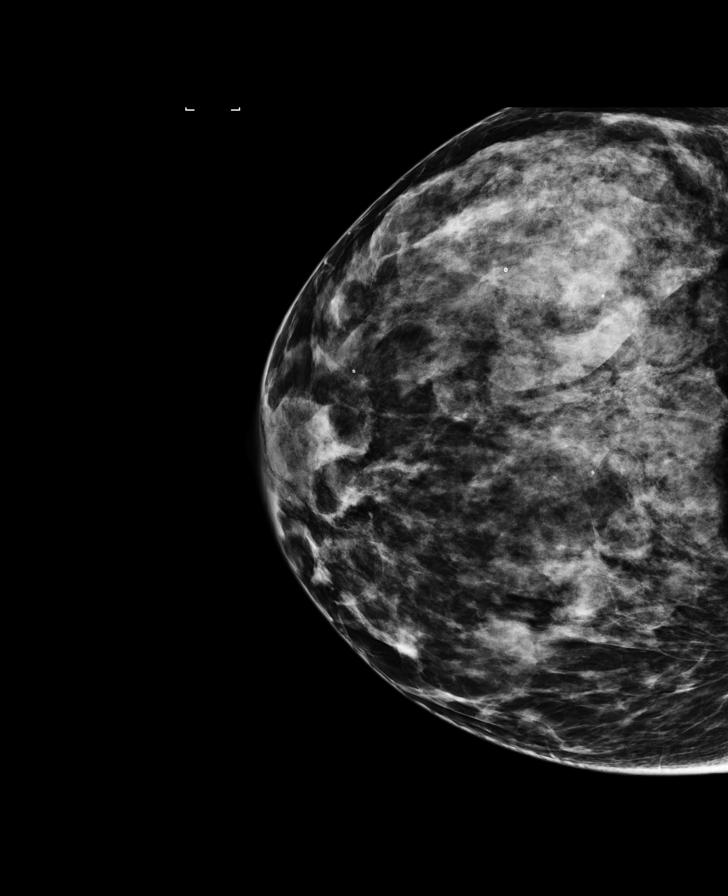

[L CC]
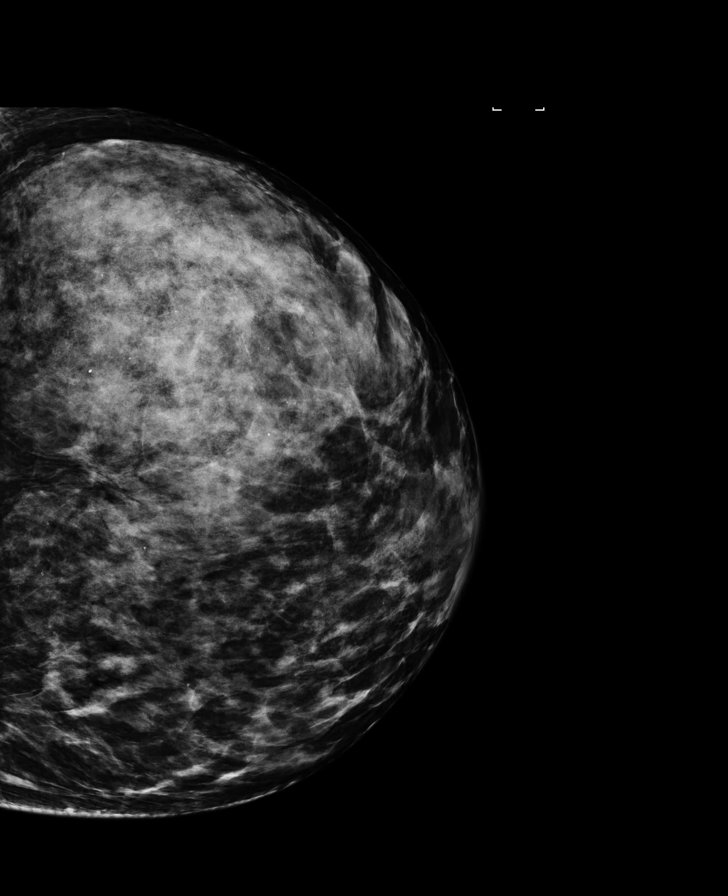

[R CC synth-2D]
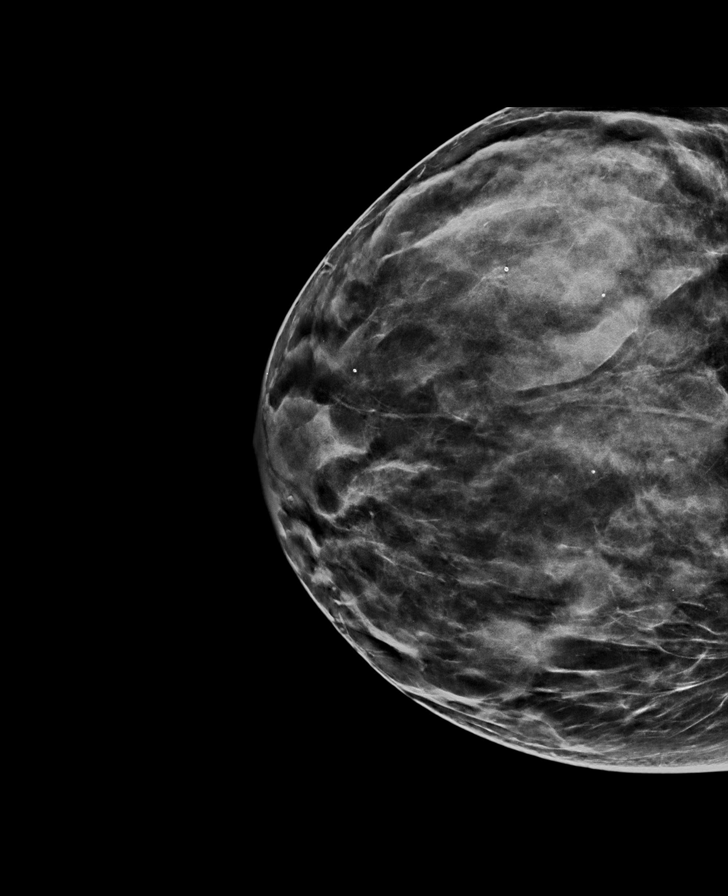

[L MLO synth-2D]
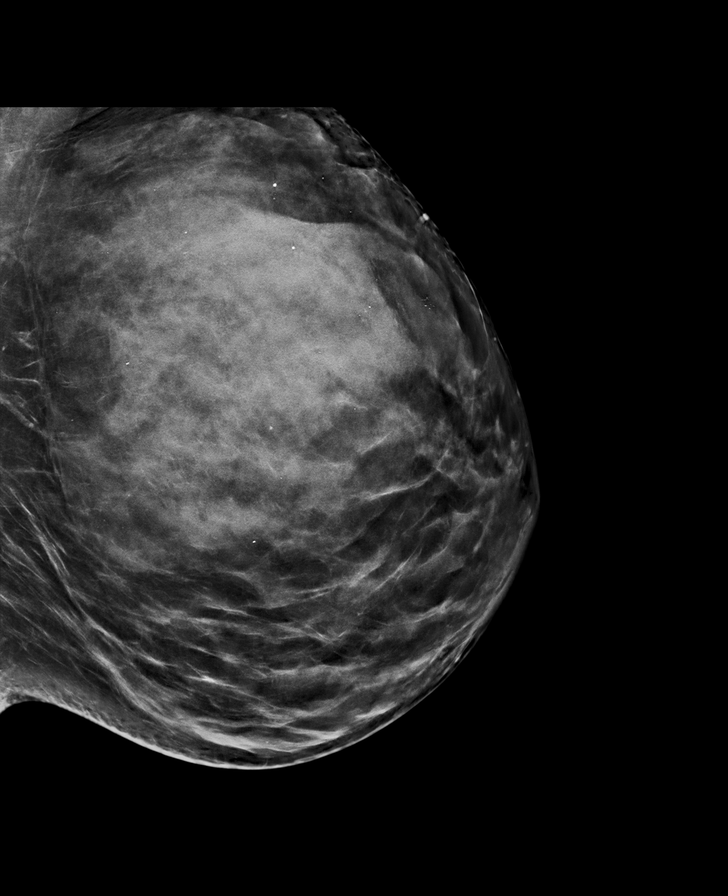

[8 of 28 positions shown; findings below may reference images not displayed]

ACR Breast Density Category d: The breast tissue is extremely dense,
which lowers the sensitivity of mammography.
FINDINGS: There are no findings suspicious for malignancy. Images were
processed with CAD.
IMPRESSION: No mammographic evidence of malignancy. A result letter of this
screening mammogram will be mailed directly to the patient.

RECOMMENDATION:
Screening mammogram in one year. (Code:US-D-RZ7)

BI-RADS CATEGORY  1: Negative.

## 2018-11-28 DIAGNOSIS — F0634 Mood disorder due to known physiological condition with mixed features: Secondary | ICD-10-CM | POA: Diagnosis not present

## 2018-12-05 ENCOUNTER — Other Ambulatory Visit: Payer: Self-pay

## 2018-12-05 ENCOUNTER — Inpatient Hospital Stay: Payer: BC Managed Care – PPO | Attending: Family

## 2018-12-05 DIAGNOSIS — C9210 Chronic myeloid leukemia, BCR/ABL-positive, not having achieved remission: Secondary | ICD-10-CM | POA: Insufficient documentation

## 2018-12-05 LAB — CBC WITH DIFFERENTIAL/PLATELET
Abs Immature Granulocytes: 0.01 10*3/uL (ref 0.00–0.07)
Basophils Absolute: 0.1 10*3/uL (ref 0.0–0.1)
Basophils Relative: 1 %
Eosinophils Absolute: 0.5 10*3/uL (ref 0.0–0.5)
Eosinophils Relative: 7 %
HCT: 35.9 % — ABNORMAL LOW (ref 36.0–46.0)
Hemoglobin: 12.2 g/dL (ref 12.0–15.0)
Immature Granulocytes: 0 %
Lymphocytes Relative: 29 %
Lymphs Abs: 2.1 10*3/uL (ref 0.7–4.0)
MCH: 29.7 pg (ref 26.0–34.0)
MCHC: 34 g/dL (ref 30.0–36.0)
MCV: 87.3 fL (ref 80.0–100.0)
Monocytes Absolute: 0.6 10*3/uL (ref 0.1–1.0)
Monocytes Relative: 8 %
Neutro Abs: 4.1 10*3/uL (ref 1.7–7.7)
Neutrophils Relative %: 55 %
Platelets: 263 10*3/uL (ref 150–400)
RBC: 4.11 MIL/uL (ref 3.87–5.11)
RDW: 14.9 % (ref 11.5–15.5)
WBC: 7.3 10*3/uL (ref 4.0–10.5)
nRBC: 0 % (ref 0.0–0.2)

## 2018-12-05 LAB — CMP (CANCER CENTER ONLY)
ALT: 27 U/L (ref 0–44)
AST: 22 U/L (ref 15–41)
Albumin: 4.3 g/dL (ref 3.5–5.0)
Alkaline Phosphatase: 72 U/L (ref 38–126)
Anion gap: 7 (ref 5–15)
BUN: 9 mg/dL (ref 6–20)
CO2: 27 mmol/L (ref 22–32)
Calcium: 8.9 mg/dL (ref 8.9–10.3)
Chloride: 105 mmol/L (ref 98–111)
Creatinine: 1.08 mg/dL — ABNORMAL HIGH (ref 0.44–1.00)
GFR, Est AFR Am: >60 mL/min (ref >60)
GFR, Estimated: >60 mL/min (ref >60)
Glucose, Bld: 81 mg/dL (ref 70–99)
Potassium: 3.9 mmol/L (ref 3.5–5.1)
Sodium: 139 mmol/L (ref 135–145)
Total Bilirubin: 0.4 mg/dL (ref 0.3–1.2)
Total Protein: 7 g/dL (ref 6.5–8.1)

## 2018-12-05 LAB — LACTATE DEHYDROGENASE: LDH: 222 U/L — ABNORMAL HIGH (ref 98–192)

## 2018-12-23 ENCOUNTER — Other Ambulatory Visit: Payer: Self-pay | Admitting: Hematology

## 2018-12-23 DIAGNOSIS — C9210 Chronic myeloid leukemia, BCR/ABL-positive, not having achieved remission: Secondary | ICD-10-CM

## 2018-12-27 NOTE — Telephone Encounter (Signed)
Refill request.  Next scheduled lab: 01-16-2019 with F/U 01-30-2019.

## 2018-12-30 ENCOUNTER — Encounter: Payer: Self-pay | Admitting: *Deleted

## 2018-12-30 ENCOUNTER — Telehealth: Payer: Self-pay | Admitting: Gastroenterology

## 2018-12-30 NOTE — Telephone Encounter (Signed)
Spoke with the patient who has been dx with chronic myelogenous leukemia and is currently taking Imatimib 300 mg daily for life. No IV chemotherapeutics, bone marrow biopsy in 05/2018, began tx in April 2020. The patient has a recall colon to reassess a piecemeal polypectomy site. The patient is highly read concerning the Imatimib and reports this medication in contraindicated with many anesthetics, pain relievers and antibiotics. She also reports that this medication is used to tx to gastric/colorectal cancers thus lowering her chances of developing a colorectal cancer. She is wondering what the risks are coming to the [facility] for the colonoscopy (COVID-19 related) while  being immunocompromised versus postponing her colonoscopy due to her being on this chemotherapeutic mediation that she states lowers her risk of colon cancer. She is highly worried about the prep and having the possibly discontinue this medication for a short period of time. She has many, many questions and concerns and wanted to know if it was possible for her to have a virtual consultation with you to speak about this prior to scheduling. Please advise.

## 2018-12-30 NOTE — Telephone Encounter (Signed)
Pt is due for a recall colon but reported that she is going through chemotherapy.  Pt would like to discuss risks of colon during COVID-19 pandemic.

## 2018-12-30 NOTE — Telephone Encounter (Signed)
Left message on patient's VM, MyChart message sent, patient scheduled for VIRTUAL VISIT PER DR. STARK on 02/03/2019 at 2:30 pm.

## 2018-12-30 NOTE — Telephone Encounter (Signed)
Spoke to the patient who is unwilling to come in to the office due to her cancer dx and taking chemotherapeutic drugs. The patient reports that she has also been seeing her oncologist virtually and expressed disappointment in the rigidity of the rules of being seen in person at [facility]. The patient stated that she will possibly consult with [facility] after speaking to her oncologist team due to her increased risk of catching COVID and being unable to have a virtual consultation.

## 2018-12-30 NOTE — Telephone Encounter (Signed)
We can offer her a virtual appointment this time given her concerns. In person appts allow a physical exam so we encoureage them over virtual appointments to provide the best possible medical care.

## 2018-12-30 NOTE — Telephone Encounter (Signed)
I recommend an in person office appt to fully discuss her concerns and to examine her.

## 2019-01-15 ENCOUNTER — Other Ambulatory Visit: Payer: Self-pay | Admitting: *Deleted

## 2019-01-16 ENCOUNTER — Other Ambulatory Visit: Payer: Self-pay

## 2019-01-16 ENCOUNTER — Inpatient Hospital Stay: Payer: BC Managed Care – PPO | Attending: Family

## 2019-01-16 ENCOUNTER — Other Ambulatory Visit: Payer: Self-pay | Admitting: *Deleted

## 2019-01-16 DIAGNOSIS — C9210 Chronic myeloid leukemia, BCR/ABL-positive, not having achieved remission: Secondary | ICD-10-CM | POA: Diagnosis not present

## 2019-01-16 LAB — CBC WITH DIFFERENTIAL (CANCER CENTER ONLY)
Abs Immature Granulocytes: 0.01 10*3/uL (ref 0.00–0.07)
Basophils Absolute: 0.1 10*3/uL (ref 0.0–0.1)
Basophils Relative: 1 %
Eosinophils Absolute: 0.4 10*3/uL (ref 0.0–0.5)
Eosinophils Relative: 6 %
HCT: 35.9 % — ABNORMAL LOW (ref 36.0–46.0)
Hemoglobin: 12.5 g/dL (ref 12.0–15.0)
Immature Granulocytes: 0 %
Lymphocytes Relative: 30 %
Lymphs Abs: 2 10*3/uL (ref 0.7–4.0)
MCH: 31.2 pg (ref 26.0–34.0)
MCHC: 34.8 g/dL (ref 30.0–36.0)
MCV: 89.5 fL (ref 80.0–100.0)
Monocytes Absolute: 0.5 10*3/uL (ref 0.1–1.0)
Monocytes Relative: 7 %
Neutro Abs: 3.8 10*3/uL (ref 1.7–7.7)
Neutrophils Relative %: 56 %
Platelet Count: 259 10*3/uL (ref 150–400)
RBC: 4.01 MIL/uL (ref 3.87–5.11)
RDW: 13.3 % (ref 11.5–15.5)
WBC Count: 6.8 10*3/uL (ref 4.0–10.5)
nRBC: 0 % (ref 0.0–0.2)

## 2019-01-16 LAB — CMP (CANCER CENTER ONLY)
ALT: 22 U/L (ref 0–44)
AST: 21 U/L (ref 15–41)
Albumin: 4.2 g/dL (ref 3.5–5.0)
Alkaline Phosphatase: 70 U/L (ref 38–126)
Anion gap: 7 (ref 5–15)
BUN: 10 mg/dL (ref 6–20)
CO2: 29 mmol/L (ref 22–32)
Calcium: 9 mg/dL (ref 8.9–10.3)
Chloride: 105 mmol/L (ref 98–111)
Creatinine: 1.01 mg/dL — ABNORMAL HIGH (ref 0.44–1.00)
GFR, Est AFR Am: >60 mL/min (ref >60)
GFR, Estimated: >60 mL/min (ref >60)
Glucose, Bld: 94 mg/dL (ref 70–99)
Potassium: 4.1 mmol/L (ref 3.5–5.1)
Sodium: 141 mmol/L (ref 135–145)
Total Bilirubin: 0.4 mg/dL (ref 0.3–1.2)
Total Protein: 7 g/dL (ref 6.5–8.1)

## 2019-01-16 LAB — LACTATE DEHYDROGENASE: LDH: 239 U/L — ABNORMAL HIGH (ref 98–192)

## 2019-01-20 ENCOUNTER — Other Ambulatory Visit: Payer: Self-pay | Admitting: Hematology

## 2019-01-20 DIAGNOSIS — F0634 Mood disorder due to known physiological condition with mixed features: Secondary | ICD-10-CM | POA: Diagnosis not present

## 2019-01-20 DIAGNOSIS — C9210 Chronic myeloid leukemia, BCR/ABL-positive, not having achieved remission: Secondary | ICD-10-CM

## 2019-01-20 MED ORDER — IMATINIB MESYLATE 100 MG PO TABS: 300.0000 mg | ORAL_TABLET | Freq: Every day | ORAL | 0 refills | Status: DC

## 2019-01-21 ENCOUNTER — Telehealth: Payer: Self-pay | Admitting: *Deleted

## 2019-01-21 NOTE — Telephone Encounter (Signed)
Patient called -[facility] requesting results from lab tests on 10/15 and refill of imatinib. Patient contacted, lab results given and advised that MyChart will release lab results 5 days after posting.   Informed her that Dr. Irene Limbo refilled Imatinib and asked that patient be informed that it is a one month supply until appt on 10/29 with him to discuss which caregiver she plans to follow [facility] visit note from Dr. Leretha Pol at [facility] in [place], 11/22/2018].  Patient verbalized understanding of all information.   She requested it be noted in her chart that she wants to continue her care with Dr.Kale as her primary cancer doctor and will see a specialist from time to time. Informed her it will be noted in chart.

## 2019-01-29 LAB — BCR ABL1 FISH (GENPATH)

## 2019-01-30 ENCOUNTER — Inpatient Hospital Stay (HOSPITAL_BASED_OUTPATIENT_CLINIC_OR_DEPARTMENT_OTHER): Payer: BC Managed Care – PPO | Admitting: Hematology

## 2019-01-30 ENCOUNTER — Other Ambulatory Visit: Payer: Self-pay | Admitting: Hematology

## 2019-01-30 DIAGNOSIS — C9210 Chronic myeloid leukemia, BCR/ABL-positive, not having achieved remission: Secondary | ICD-10-CM

## 2019-01-30 NOTE — Progress Notes (Signed)
HEMATOLOGY/ONCOLOGY CLINIC NOTE  Date of Service: 01/30/2019  Patient Care Team: Copland, Gay Filler, MD as PCP - General (Family Medicine)  CHIEF COMPLAINTS/PURPOSE OF CONSULTATION:  Chronic Myeloid Leukemia  HISTORY OF PRESENTING ILLNESS:   Victoria Hall is a wonderful 48 y.o. female who has been referred to [place] by Dr. Silvestre Mesi for evaluation and management of CML. The pt was formerly under the care of my colleague Dr. Burney Gauze, and has transferred her care closer to home. The pt reports that she is doing well overall.  Prior to today's visit, the pt was recently diagnosed with CML after a 05/16/18 bone marrow biopsy revealed a translocation 9;22 and FISH study revealed BCR-ABL gene rearranagement in 85% of cells. The pt was prescribed '400mg'$  Imatinib with Dr. Marin Olp on 06/03/18.  The pt reports that she had intermittent blurry vision for the last 4 years. She notes that she had night sweats for the last 20 years. She notes that her WBC have been elevated for the last 4 years. She notes that she has had elevated Basophils since April 2018. She has been evaluated for mast cell activation, and has had normal tryptase levels, including most recently on 06/03/18.  She reports that she went to the [facility] in October 2019 for suspicions of dysautonomia. The pt notes that she has "exercise intolerance," for the last 4 years, which she describes as needing to "lie on the couch for 2 weeks," after taking a run.  She has had intermittent rashes and hives for the last 20 years, which do resolve on their own. She keeps a food and activity journal for the last 2 years, and denies awareness of any associations with her rash onset. She has seen dermatology and immunology regarding this. The pt has also seen a rheumatologist for evaluation.  The pt notes that her hands and feet frequently fall asleep, and has been evaluated by neurology with an NCV. She notes that she has cervical  radiculopathy which she relates to her 2005 C5-7 fusion. She endorses exhaustion which "comes on out of no where." She notes her energy levels change "from minute to minute."   The pt denies abdominal pains or mouth sores.   The pt endorse frequent muscle and joint cramping as well. She has tried using CBD oil which she found to be helpful, but notes that the cost became prohibitive.  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  Most recent lab results (06/03/18) of CBC w/diff and CMP is as follows: all values are WNL except for WBC at 18.8k, ANC at 12.9k Basophils abs at 600, Abs immature granulocytes at 1.02k 06/24/18 LDH at 237  On review of systems, pt reports intermittent sudden-onset fatigue, intermittent headaches, intermittent hives and rashes, muscle and joint cramping, and denies abdominal pains, mouth sores, leg swelling, and any other symptoms.   On PMHx the pt reports chronic cervical radiculopathy with fusion of C5-7 in 2005. 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 She endorses concern for chemical exposure related to her hobbies of painting and furniture restoration.  Interval History:   I connected with Victoria Hall on 01/30/19 at  2:00 PM EDT by TELEHEALTH and verified that I am speaking with the correct person using two identifiers.   I discussed the limitations, risks, security and privacy concerns of performing an evaluation and management service by telemedicine and the  availability of in-person appointments. I also discussed with the patient that there may be a patient responsible charge related to this service. The patient expressed understanding and agreed to proceed.    Patient's location: HOME Provider's location: [place]   Chief Complaint: Chronic Myeloid Leukemia    The patient's last visit with [place] was on 10/24/2018. The pt reports that she is doing well  overall.  The pt reports she still experience some fatigue and cramping.  She is still taking '300mg'$  of Imatinib po daily and maintaining good compliance.  She is seeing a dermatologist for her rashes and eczema on forehead. The rashes varies but are currently better.  She notes that she feels that her fatigue and diarrhea and Imatinib associated adverse effects are tolerable to her currently.  Lab results today (01/16/19) of CBC w/diff and CMP is as follows: all values are WNL except for HCT at 35.9, Creatinine at 1.01,LDH at  239.  On review of systems, pt reports some fatigue and rashes and denies any other symptoms.     MEDICAL HISTORY:  Past Medical History:  Diagnosis Date  . Anxiety   . Arthritis   . Cervical intraepithelial neoplasia grade 3 2002   s/p LEEP Rx repeat pap negative  . Cervical radiculopathy   . CML (chronic myelocytic leukemia) ([place]) 06/03/2018  . Fallopian tube disorder    right fallopian tube removed  . Gallstones 09/2015   On CT  . Headache    Migraines  . Hemorrhagic ovarian cyst 02/08/2016  . Hydrosalpinx    [facility]  . Hydrosalpinx 02/08/2016  . Interstitial cystitis   . Neurological abnormality    Neg workup with MRI/MRA in 2007 WNL except decreased caliber MCA proximally, distal cavernous portion of left LCA which may represent true stenosis or technique on exam. Evaluated by Dr Linus Salmons.  . Palpitations   . Partial seizures (H[facility])    questionable diag  . Pneumonia    walking pneumonia  . Polycystic ovary    multiple ovarian cysts removed  . S/P cervical discectomy    Dr Vertell Limber, anterior discectomy C5-C7  . S/P epidural steroid injection    Right hip  . Seizures ([place]) 2005   simple partial seizure disorder    SURGICAL HISTORY: Past Surgical History:  Procedure Laterality Date  . APPENDECTOMY  1986  . BUNIONECTOMY Left    bunion removal  . CERVICAL DISC SURGERY  2005   C5-C7  . COLONOSCOPY    . DENTAL SURGERY    .  fallopian tue removed    . LAPAROSCOPIC BILATERAL SALPINGECTOMY Left 02/09/2016   Procedure: LAPAROSCOPIC LEFT  SALPINGECTOMY;  Surgeon: Janyth Contes, MD;  Location: [facility];  Service: Gynecology;  Laterality: Left;  . LAPAROSCOPIC LYSIS OF ADHESIONS  02/09/2016   Procedure: LAPAROSCOPIC LYSIS OF ADHESIONS;  Surgeon: Janyth Contes, MD;  Location: [facility];  Service: Gynecology;;  momentum to adenexa  . LAPAROSCOPIC OVARIAN CYSTECTOMY Right 02/09/2016   Procedure: LAPAROSCOPIC OVARIAN CYSTECTOMY;  Surgeon: Janyth Contes, MD;  Location: [facility];  Service: Gynecology;  Laterality: Right;  x2 to right ovary  . LEEP  1998  . OOPHORECTOMY Left 02/09/2016   Procedure: LEFT OOPHORECTOMY;  Surgeon: Janyth Contes, MD;  Location: [facility];  Service: Gynecology;  Laterality: Left;  . TONSILLECTOMY  2001  . UPPER GI ENDOSCOPY      SOCIAL HISTORY: Social History   Socioeconomic History  . Marital status: Married    Spouse name: Not on file  .  Number of children: 0  . Years of education: Not on file  . Highest education level: Not on file  Occupational History  . 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  . Occupation: Pharmacist, hospital  Social Needs  . Financial resource strain: Not on file  . Food insecurity    Worry: Not on file    Inability: Not on file  . Transportation needs    Medical: Not on file    Non-medical: Not on file  Tobacco Use  . Smoking status: Former Smoker    Packs/day: 0.00    Years: 0.00    Pack years: 0.00    Types: Cigarettes    Quit date: 11/02/2015    Years since quitting: 3.2  . Smokeless tobacco: Never Used  . Tobacco comment: currently smoking, began at beginning of June and plans to quit on June 14th. Has smoked off and on in the past.  Substance and Sexual Activity  . Alcohol use: No    Alcohol/week: 1.0 - 2.0 standard drinks    Types: 1 - 2 Standard drinks or equivalent per week     Comment: occasionally 0-2 per day  . Drug use: No  . Sexual activity: Yes  Lifestyle  . Physical activity    Days per week: Not on file    Minutes per session: Not on file  . Stress: Not on file  Relationships  . Social Herbalist on phone: Not on file    Gets together: Not on file    Attends religious service: Not on file    Active member of club or organization: Not on file    Attends meetings of clubs or organizations: Not on file    Relationship status: Not on file  . Intimate partner violence    Fear of current or ex partner: Not on file    Emotionally abused: Not on file    Physically abused: Not on file    Forced sexual activity: Not on file  Other Topics Concern  . Not on file  Social History Narrative  . Not on file    FAMILY HISTORY: Family History  Adopted: Yes  Problem Relation Age of Onset  . COPD Mother   . Breast cancer Mother   . Other Mother        Teeth problems- all removed by age 32  . Rashes / Skin problems Sister   . Other Sister        Teeth problems- all removed by age 5  . Rashes / Skin problems Brother   . Asthma Brother   . Other Brother        Teeth problems- all removed by age 56    ALLERGIES:  is allergic to clindamycin/lincomycin; codeine; cyclobenzaprine; gabapentin; nyquil multi-symptom [pseudoeph-doxylamine-dm-apap]; uribel [meth-hyo-m bl-na phos-ph sal]; xanax [alprazolam]; and urelle.  MEDICATIONS:  Current Outpatient Medications  Medication Sig Dispense Refill  . diazepam (VALIUM) 2 MG tablet Take 0.5-1 tablets (1-2 mg total) by mouth every 8 (eight) hours as needed for anxiety. 30 tablet 0  . famotidine (PEPCID) 20 MG tablet Take 20 mg by mouth 2 (two) times daily as needed (management of imatinib ADEs: nausea, diarrhea, muscle and joint pain).    Marland Kitchen imatinib (GLEEVEC) 100 MG tablet Take 3 tablets (300 mg total) by mouth daily. Take with meals and large glass of water.Caution:Chemotherapy 90 tablet 0  . ketoconazole  (NIZORAL) 2 % cream Apply  topically 2 (two) times daily. to affected area    . loratadine (CLARITIN) 10 MG tablet Take 10 mg by mouth 2 (two) times daily as needed (management of imatinib ADEs: nausea, diarrhea, muscle and joint pain).     No current facility-administered medications for this visit.     REVIEW OF SYSTEMS:    A 10+ POINT REVIEW OF SYSTEMS WAS OBTAINED including neurology, dermatology, psychiatry, cardiac, respiratory, lymph, extremities, GI, GU, Musculoskeletal, constitutional, breasts, reproductive, HEENT.  All pertinent positives are noted in the HPI.  All others are negative.    PHYSICAL EXAMINATION: There were no vitals filed for this visit. Wt Readings from Last 3 Encounters:  08/28/18 173 lb (78.5 kg)  07/25/18 175 lb 3.2 oz (79.5 kg)  06/24/18 176 lb 9.6 oz (80.1 kg)   There is no height or weight on file to calculate BMI.    Telehealth Visit 01/30/19   LABORATORY DATA:  I have reviewed the data as listed  . CBC Latest Ref Rng & Units 01/16/2019 12/05/2018 10/14/2018  WBC 4.0 - 10.5 K/uL 6.8 7.3 6.2  Hemoglobin 12.0 - 15.0 g/dL 12.5 12.2 14.2  Hematocrit 36.0 - 46.0 % 35.9(L) 35.9(L) 42.9  Platelets 150 - 400 K/uL 259 263 242   . CBC    Component Value Date/Time   WBC 6.8 01/16/2019 1402   WBC 7.3 12/05/2018 1409   RBC 4.01 01/16/2019 1402   HGB 12.5 01/16/2019 1402   HGB 15.1 03/26/2017 1101   HCT 35.9 (L) 01/16/2019 1402   HCT 44.0 03/26/2017 1101   PLT 259 01/16/2019 1402   PLT 330 03/26/2017 1101   MCV 89.5 01/16/2019 1402   MCV 84 03/26/2017 1101   MCH 31.2 01/16/2019 1402   MCHC 34.8 01/16/2019 1402   RDW 13.3 01/16/2019 1402   RDW 14.0 03/26/2017 1101   LYMPHSABS 2.0 01/16/2019 1402   LYMPHSABS 2.8 03/26/2017 1101   MONOABS 0.5 01/16/2019 1402   EOSABS 0.4 01/16/2019 1402   EOSABS 0.4 03/26/2017 1101   BASOSABS 0.1 01/16/2019 1402   BASOSABS 0.2 03/26/2017 1101     . CMP Latest Ref Rng & Units 01/16/2019 12/05/2018 10/14/2018   Glucose 70 - 99 mg/dL 94 81 106(H)  BUN 6 - 20 mg/dL '10 9 10  '$ Creatinine 0.44 - 1.00 mg/dL 1.01(H) 1.08(H) 1.04(H)  Sodium 135 - 145 mmol/L 141 139 141  Potassium 3.5 - 5.1 mmol/L 4.1 3.9 4.5  Chloride 98 - 111 mmol/L 105 105 104  CO2 22 - 32 mmol/L '29 27 27  '$ Calcium 8.9 - 10.3 mg/dL 9.0 8.9 9.2  Total Protein 6.5 - 8.1 g/dL 7.0 7.0 7.7  Total Bilirubin 0.3 - 1.2 mg/dL 0.4 0.4 0.4  Alkaline Phos 38 - 126 U/L 70 72 77  AST 15 - 41 U/L '21 22 20  '$ ALT 0 - 44 U/L '22 27 23  '$ 01/28/2019  FISH   07/25/18 Initial BCR-ABL:    06/03/18 FISH:   05/16/18 Cytogenetics:   04/10/18 Hemochromatosis Panel:    RADIOGRAPHIC STUDIES: I have personally reviewed the radiological images as listed and agreed with the findings in the report. No results found.  01/31/18 ECHO Transthoracic:    ASSESSMENT & PLAN:  48 y.o. female with  1. CML- Chronic phase 05/16/18 BM Cytogenetics revealed translocation 9;22. FISH report revealed BCR-ABL gene rearrangement present in 86.5% of cells 06/03/18 FISH revealed BCR-ABL gene rearrangement present in 85.67% of cells, with some hypercellularity around 80% 06/05/18 US Abdomen revealed normal spleen  size 01/31/18 ECHO revealed LV EF of 63%, no regional wall motion abnormalities, no significant valvular hear disease  Initial pre-treatment 07/25/18 BCR-ABL revealed IS at 71.1362%  08/28/18 EKG reviewed  2. 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 -BCR- ABL PCR was not sent -- will be collected tomorrow -Discussed that given her treatment tolerance issues will hold off on Imatinib dose escalation and will continue current dose of 300 mg po daily currently. -patient had opted to be evaluated at [facility]  and was seen by Dr Leretha Pol.. Outside records reviewed. -We discussed where she would like to continue her care for [facility] and she notes that she would like to continue her oncologic cares with our clinic at this time. -Labs tomorrow and follow up with office in 3 months   FOLLOW UP: [place] in 8 weeks Phone visit with Dr Irene Limbo in 10 weeks  The total time spent in the appt was 25 minutes and more than 50% was on counseling and direct patient cares.  All of the patient's questions were answered with apparent satisfaction. The patient knows to call the clinic with any problems, questions or concerns.   Sullivan Lone MD MS AAHIVMS [facility] Hematology/Oncology Physician [facility]  (Office):       [phone] (Work cell):  [phone] (Fax):           [phone]  01/30/2019 8:56 AM  I, Scot Dock, am acting as a scribe for Dr. Sullivan Lone.   .I have reviewed the above documentation for accuracy and completeness, and I agree with the above. Brunetta Genera MD

## 2019-01-31 ENCOUNTER — Inpatient Hospital Stay: Payer: BC Managed Care – PPO

## 2019-01-31 ENCOUNTER — Other Ambulatory Visit: Payer: Self-pay

## 2019-01-31 ENCOUNTER — Telehealth: Payer: Self-pay | Admitting: Hematology

## 2019-01-31 DIAGNOSIS — C9210 Chronic myeloid leukemia, BCR/ABL-positive, not having achieved remission: Secondary | ICD-10-CM

## 2019-01-31 NOTE — Telephone Encounter (Signed)
Scheduled appt per 10/29 los.  Spoke with pt and she is aware of the appt dates and time.

## 2019-02-03 ENCOUNTER — Ambulatory Visit (INDEPENDENT_AMBULATORY_CARE_PROVIDER_SITE_OTHER): Payer: BC Managed Care – PPO | Admitting: Gastroenterology

## 2019-02-03 ENCOUNTER — Other Ambulatory Visit: Payer: Self-pay

## 2019-02-03 ENCOUNTER — Ambulatory Visit: Payer: BC Managed Care – PPO | Admitting: Gastroenterology

## 2019-02-03 ENCOUNTER — Encounter: Payer: Self-pay | Admitting: Gastroenterology

## 2019-02-03 VITALS — Ht 66.0 in | Wt 180.0 lb

## 2019-02-03 DIAGNOSIS — C9210 Chronic myeloid leukemia, BCR/ABL-positive, not having achieved remission: Secondary | ICD-10-CM

## 2019-02-03 DIAGNOSIS — Z86010 Personal history of colonic polyps: Secondary | ICD-10-CM | POA: Diagnosis not present

## 2019-02-03 DIAGNOSIS — R110 Nausea: Secondary | ICD-10-CM | POA: Diagnosis not present

## 2019-02-03 DIAGNOSIS — R197 Diarrhea, unspecified: Secondary | ICD-10-CM | POA: Diagnosis not present

## 2019-02-03 NOTE — Patient Instructions (Signed)
You will be due for a recall colonoscopy in 01/2020. We will send you a reminder in the mail when it gets closer to that time.  Thank you for choosing me and [facility].  Pricilla Riffle. Dagoberto Ligas., MD., Marval Regal

## 2019-02-03 NOTE — Progress Notes (Signed)
History of Present Illness: This is a 48 year old female referred by Copland, Gay Filler, MD for the evaluation of nausea, diarrhea, constipation, personal history of adenomatous colon polyps.  She was diagnosed with CML in February 2020 and has been treated with Gleevec for about 6 months.  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  Since beginning [place] she has had difficulties with nausea and diarrhea.  Beginning loratadine and famotidine has helped substantially along with frequent small meals and snacks.  She feels her symptoms are under reasonably good control.  She had a piecemeal polypectomy in September 2017 and a 1 year colonoscopy was recommended to evaluate the polyp site however she did not return.  This visit was performed virtually per her request.  Denies weight loss, abdominal pain, change in stool caliber, melena, hematochezia, vomiting, dysphagia, reflux symptoms, chest pain.   Colonosccopy 12/2015 - One 15 mm polyp in the cecum, removed piecemeal using a hot snare. Resected and retrieved. - One 5 mm polyp in the sigmoid colon, removed with a cold snare. Resected and retrieved. - The examined portion of the ileum was normal. - The examination was otherwise normal on direct and retroflexion views.  EGD 12/2015 - Normal esophagus. - Normal stomach. - Normal examined duodenum. - No specimens collected.   Allergies  Allergen Reactions  . Clindamycin/Lincomycin Itching and Other (See Comments)    Dizziness, trouble swallowing  . Codeine Itching and Nausea And Vomiting  . Cyclobenzaprine Other (See Comments)    Fast heartbeat, restless leg, nightmares  . Gabapentin Other (See Comments)    Headaches, visual disturbances.   . Nyquil Multi-Symptom [Pseudoeph-Doxylamine-Dm-Apap] Other (See Comments)    Heart racing, dizziness, weakness  . Uribel [Meth-Hyo-M Bl-Na Phos-Ph Sal] Other (See Comments)   Heart racing, dizziness, blurry vision.  . Xanax [Alprazolam]     Bruising  . Urelle Nausea And Vomiting   Outpatient Medications Prior to Visit  Medication Sig Dispense Refill  . diazepam (VALIUM) 2 MG tablet Take 0.5-1 tablets (1-2 mg total) by mouth every 8 (eight) hours as needed for anxiety. 30 tablet 0  . famotidine (PEPCID) 20 MG tablet Take 20 mg by mouth 2 (two) times daily as needed (management of imatinib ADEs: nausea, diarrhea, muscle and joint pain).    Marland Kitchen imatinib (GLEEVEC) 100 MG tablet TAKE 3 TABLETS (300MG ) BY MOUTH ONCE DAILY AT THE SAME TIME WITH FOOD AND A LARGE GLASS OF WATER. AVOID GRAPEFRUIT PRODUCTS. 90 tablet 0  . ketoconazole (NIZORAL) 2 % cream Apply topically 2 (two) times daily. to affected area    . loratadine (CLARITIN) 10 MG tablet Take 10 mg by mouth 2 (two) times daily as needed (management of imatinib ADEs: nausea, diarrhea, muscle and joint pain).     No facility-administered medications prior to visit.    Past Medical History:  Diagnosis Date  . Anxiety   . Arthritis   . Cervical intraepithelial neoplasia grade 3 2002   s/p LEEP Rx repeat pap negative  . Cervical radiculopathy   . CML (chronic myelocytic leukemia) ([facility]) 06/03/2018  . Fallopian tube disorder    right fallopian tube removed  . Gallstones 09/2015   On CT  . Headache    Migraines  . Hemorrhagic ovarian cyst 02/08/2016  . Hydrosalpinx    [facility]  . Hydrosalpinx 02/08/2016  . Interstitial cystitis   . Neurological abnormality  Neg workup with MRI/MRA in 2007 WNL except decreased caliber MCA proximally, distal cavernous portion of left LCA which may represent true stenosis or technique on exam. Evaluated by Dr Linus Salmons.  . Palpitations   . Partial seizures (H[facility])    questionable diag  . Pneumonia    walking pneumonia  . Polycystic ovary    multiple ovarian cysts removed  . S/P cervical discectomy    Dr Vertell Limber, anterior discectomy C5-C7  . S/P epidural steroid  injection    Right hip  . Seizures ([place]) 2005   simple partial seizure disorder   Past Surgical History:  Procedure Laterality Date  . APPENDECTOMY  1986  . BUNIONECTOMY Left    bunion removal  . CERVICAL DISC SURGERY  2005   C5-C7  . COLONOSCOPY    . DENTAL SURGERY    . fallopian tue removed    . LAPAROSCOPIC BILATERAL SALPINGECTOMY Left 02/09/2016   Procedure: LAPAROSCOPIC LEFT  SALPINGECTOMY;  Surgeon: Janyth Contes, MD;  Location: [facility];  Service: Gynecology;  Laterality: Left;  . LAPAROSCOPIC LYSIS OF ADHESIONS  02/09/2016   Procedure: LAPAROSCOPIC LYSIS OF ADHESIONS;  Surgeon: Janyth Contes, MD;  Location: [facility];  Service: Gynecology;;  momentum to adenexa  . LAPAROSCOPIC OVARIAN CYSTECTOMY Right 02/09/2016   Procedure: LAPAROSCOPIC OVARIAN CYSTECTOMY;  Surgeon: Janyth Contes, MD;  Location: [facility];  Service: Gynecology;  Laterality: Right;  x2 to right ovary  . LEEP  1998  . OOPHORECTOMY Left 02/09/2016   Procedure: LEFT OOPHORECTOMY;  Surgeon: Janyth Contes, MD;  Location: [facility];  Service: Gynecology;  Laterality: Left;  . TONSILLECTOMY  2001  . UPPER GI ENDOSCOPY     Social History   Socioeconomic History  . Marital status: Married    Spouse name: Not on file  . Number of children: 0  . Years of education: Not on file  . Highest education level: Not on file  Occupational History  . 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  . Occupation: Pharmacist, hospital  Social Needs  . Financial resource strain: Not on file  . Food insecurity    Worry: Not on file    Inability: Not on file  . Transportation needs    Medical: Not on file    Non-medical: Not on file  Tobacco Use  . Smoking status: Former Smoker    Packs/day: 0.00    Years: 0.00    Pack years: 0.00    Types: Cigarettes    Quit date: 11/02/2015    Years since quitting: 3.2  . Smokeless tobacco: Never Used  . Tobacco comment: currently  smoking, began at beginning of June and plans to quit on June 14th. Has smoked off and on in the past.  Substance and Sexual Activity  . Alcohol use: No    Alcohol/week: 1.0 - 2.0 standard drinks    Types: 1 - 2 Standard drinks or equivalent per week    Comment: occasionally 0-2 per day  . Drug use: No  . Sexual activity: Yes  Lifestyle  . Physical activity    Days per week: Not on file    Minutes per session: Not on file  . Stress: Not on file  Relationships  . Social Herbalist on phone: Not on file    Gets together: Not on file    Attends religious service: Not on file    Active member of club or organization: Not on file  Attends meetings of clubs or organizations: Not on file    Relationship status: Not on file  Other Topics Concern  . Not on file  Social History Narrative  . Not on file   Family History  Adopted: Yes  Problem Relation Age of Onset  . COPD Mother   . Breast cancer Mother   . Other Mother        Teeth problems- all removed by age 66  . Rashes / Skin problems Sister   . Other Sister        Teeth problems- all removed by age 74  . Rashes / Skin problems Brother   . Asthma Brother   . Other Brother        Teeth problems- all removed by age 44      Review of Systems: Pertinent positive and negative review of systems were noted in the above HPI section. All other review of systems were otherwise negative.   Physical Exam: Not performed - telemedicine visit.    Assessment and Recommendations:  1. CML on Gleevec. Mast cell activation syndrome.  Continue follow-up and treatment with Dr. Irene Limbo as planned.   2.  Nausea, alternating diarrhea and constipation.  Some symptoms due to [place].  Loratadine qd to bid and famotidine qd to bid have been effective in controlling nausea and diarrhea. Frequent meals also helps with nausea.  Continue current management.  3. Personal history of adenomatous colon polyps with piecemeal polypectomy in  2017.  She is overdue for colonoscopy follow-up.  She relates that her oncologist recommended that an interruption in [place] therapy would be necessary to undergo colonoscopy however interruptions in therapy at the wrong time can adversely impact CML management and outcome.  Gleevec has been associated with colon perforation.  Patient and her husband relate that generally 12 to 18 months of uninterrupted Gleevec is needed before interruption is safer.  We talked about precancerous colon polyp to colon cancer progression that is possible.  There is a significant chance of an incomplete polypectomy during piecemeal polypectomy and there is a significant possibility that she has grown additional precancerous polyps.  She understands this risk and she clearly prioritizes her CML treatment at this time which is reasonable.  I asked her to contact us to schedule colonoscopy when her oncologist feels it safer to proceed, off [place], potentially in 2021.  I will tentatively place a colonoscopy recall for October 2021.  4. Carrier of hemochromatosis H63D single mutation.  5. History of prolonged QTc interval.   These services were provided via telemedicine, audio and video.  The patient was at home with her husband and the provider was in the office, alone.  We discussed the limitations of evaluation and management by telemedicine and the availability of in person appointments.  Patient consented for this telemedicine visit and is aware of possible charges for this service.  Office CMA or LPN participated in this telemedicine service.  Time spent on call: 20 minutes   cc: Copland, Gay Filler, MD [address] [place],  [place] [address]

## 2019-02-04 DIAGNOSIS — F0634 Mood disorder due to known physiological condition with mixed features: Secondary | ICD-10-CM | POA: Diagnosis not present

## 2019-02-12 LAB — BCR/ABL

## 2019-02-24 DIAGNOSIS — F0634 Mood disorder due to known physiological condition with mixed features: Secondary | ICD-10-CM | POA: Diagnosis not present

## 2019-03-05 ENCOUNTER — Telehealth: Payer: Self-pay | Admitting: *Deleted

## 2019-03-05 NOTE — Telephone Encounter (Addendum)
-----   Message from Brunetta Genera, MD sent at 03/04/2019  6:54 PM EST ----- Plz let Ms Mcnorton know her BCR-ABL PCR shows Major molecular remission transcription levels at <0.1% (0.04% to be precise) -- continue current treatment strategy and f/u as planned.  Attempted to contact patient - LVM with above information.

## 2019-03-12 DIAGNOSIS — F0634 Mood disorder due to known physiological condition with mixed features: Secondary | ICD-10-CM | POA: Diagnosis not present

## 2019-03-24 ENCOUNTER — Other Ambulatory Visit: Payer: Self-pay | Admitting: Hematology

## 2019-03-24 DIAGNOSIS — C9210 Chronic myeloid leukemia, BCR/ABL-positive, not having achieved remission: Secondary | ICD-10-CM

## 2019-03-26 ENCOUNTER — Other Ambulatory Visit: Payer: Self-pay

## 2019-03-26 ENCOUNTER — Inpatient Hospital Stay: Payer: BC Managed Care – PPO | Attending: Family

## 2019-03-26 ENCOUNTER — Other Ambulatory Visit: Payer: Self-pay | Admitting: *Deleted

## 2019-03-26 DIAGNOSIS — C9210 Chronic myeloid leukemia, BCR/ABL-positive, not having achieved remission: Secondary | ICD-10-CM | POA: Insufficient documentation

## 2019-03-26 LAB — CMP (CANCER CENTER ONLY)
ALT: 28 U/L (ref 0–44)
AST: 30 U/L (ref 15–41)
Albumin: 4.2 g/dL (ref 3.5–5.0)
Alkaline Phosphatase: 71 U/L (ref 38–126)
Anion gap: 8 (ref 5–15)
BUN: 10 mg/dL (ref 6–20)
CO2: 27 mmol/L (ref 22–32)
Calcium: 8.8 mg/dL — ABNORMAL LOW (ref 8.9–10.3)
Chloride: 104 mmol/L (ref 98–111)
Creatinine: 0.92 mg/dL (ref 0.44–1.00)
GFR, Est AFR Am: >60 mL/min (ref >60)
GFR, Estimated: >60 mL/min (ref >60)
Glucose, Bld: 90 mg/dL (ref 70–99)
Potassium: 4.1 mmol/L (ref 3.5–5.1)
Sodium: 139 mmol/L (ref 135–145)
Total Bilirubin: 0.4 mg/dL (ref 0.3–1.2)
Total Protein: 6.9 g/dL (ref 6.5–8.1)

## 2019-03-26 LAB — CBC WITH DIFFERENTIAL/PLATELET
Abs Immature Granulocytes: 0.01 10*3/uL (ref 0.00–0.07)
Basophils Absolute: 0.1 10*3/uL (ref 0.0–0.1)
Basophils Relative: 1 %
Eosinophils Absolute: 0.4 10*3/uL (ref 0.0–0.5)
Eosinophils Relative: 6 %
HCT: 37.2 % (ref 36.0–46.0)
Hemoglobin: 12.9 g/dL (ref 12.0–15.0)
Immature Granulocytes: 0 %
Lymphocytes Relative: 27 %
Lymphs Abs: 2 10*3/uL (ref 0.7–4.0)
MCH: 30.7 pg (ref 26.0–34.0)
MCHC: 34.7 g/dL (ref 30.0–36.0)
MCV: 88.6 fL (ref 80.0–100.0)
Monocytes Absolute: 0.6 10*3/uL (ref 0.1–1.0)
Monocytes Relative: 8 %
Neutro Abs: 4.3 10*3/uL (ref 1.7–7.7)
Neutrophils Relative %: 58 %
Platelets: 268 10*3/uL (ref 150–400)
RBC: 4.2 MIL/uL (ref 3.87–5.11)
RDW: 13.2 % (ref 11.5–15.5)
WBC: 7.4 10*3/uL (ref 4.0–10.5)
nRBC: 0 % (ref 0.0–0.2)

## 2019-03-26 LAB — LACTATE DEHYDROGENASE: LDH: 234 U/L — ABNORMAL HIGH (ref 98–192)

## 2019-04-09 ENCOUNTER — Inpatient Hospital Stay: Payer: BC Managed Care – PPO | Attending: Family | Admitting: Hematology

## 2019-04-09 DIAGNOSIS — C9210 Chronic myeloid leukemia, BCR/ABL-positive, not having achieved remission: Secondary | ICD-10-CM | POA: Diagnosis not present

## 2019-04-09 LAB — BCR/ABL

## 2019-04-09 MED ORDER — IMATINIB MESYLATE 100 MG PO TABS: 200.0000 mg | ORAL_TABLET | Freq: Every day | ORAL | 2 refills | Status: DC

## 2019-04-09 NOTE — Progress Notes (Signed)
HEMATOLOGY/ONCOLOGY CLINIC NOTE  Date of Service: 04/09/2019  Patient Care Team: Copland, Gay Filler, MD as PCP - General (Family Medicine)  CHIEF COMPLAINTS/PURPOSE OF CONSULTATION:  Chronic Myeloid Leukemia  HISTORY OF PRESENTING ILLNESS:   Victoria Hall is a wonderful 49 y.o. female who has been referred to [place] by Dr. Silvestre Mesi for evaluation and management of CML. The pt was formerly under the care of my colleague Dr. Burney Gauze, and has transferred her care closer to home. The pt reports that she is doing well overall.  Prior to today's visit, the pt was recently diagnosed with CML after a 05/16/18 bone marrow biopsy revealed a translocation 9;22 and FISH study revealed BCR-ABL gene rearranagement in 85% of cells. The pt was prescribed 456m Imatinib with Dr. EMarin Olpon 06/03/18.  The pt reports that she had intermittent blurry vision for the last 4 years. She notes that she had night sweats for the last 20 years. She notes that her WBC have been elevated for the last 4 years. She notes that she has had elevated Basophils since April 2018. She has been evaluated for mast cell activation, and has had normal tryptase levels, including most recently on 06/03/18.  [facility]. The pt notes that she has "exercise intolerance," for the last 4 years, which she describes as needing to "lie on the couch for 2 weeks," after taking a run.  She has had intermittent rashes and hives for the last 20 years, which do resolve on their own. She keeps a food and activity journal for the last 2 years, and denies awareness of any associations with her rash onset. She has seen dermatology and immunology regarding this. The pt has also seen a rheumatologist for evaluation.  The pt notes that her hands and feet frequently fall asleep, and has been evaluated by neurology with an NCV. She notes that she has cervical  radiculopathy which she relates to her 2005 C5-7 fusion. She endorses exhaustion which "comes on out of no where." She notes her energy levels change "from minute to minute."   The pt denies abdominal pains or mouth sores.   The pt endorse frequent muscle and joint cramping as well. She has tried using CBD oil which she found to be helpful, but notes that the cost became prohibitive.  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  Most recent lab results (06/03/18) of CBC w/diff and CMP is as follows: all values are WNL except for WBC at 18.8k, ANC at 12.9k Basophils abs at 600, Abs immature granulocytes at 1.02k 06/24/18 LDH at 237  On review of systems, pt reports intermittent sudden-onset fatigue, intermittent headaches, intermittent hives and rashes, muscle and joint cramping, and denies abdominal pains, mouth sores, leg swelling, and any other symptoms.   On PMHx the pt reports chronic cervical radiculopathy with fusion of C5-7 in 2005. 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  Interval History:   I connected with  [name] 04/09/19 by telephone and verified that I am speaking with the correct person using two identifiers.   I discussed the limitations of evaluation and management by telemedicine. The patient expressed understanding and agreed to proceed.  Other persons participating in the  visit and their role in the encounter:     -Yevette Edwards, Medical Scribe  Patient's location: Home Provider's location: [place] is a wonderful 49 y.o. female who is here for evaluation and management of CML.The patient's last visit with [place] was on 01/30/2019. The pt reports that she is doing well overall.  The pt reports that she is still having some  medication side effects. Pt has been having muscle cramps, joint pain, SOB, chronic fatigue, diarrhea, consitpation. She feels that these symptoms were more tolerable at Imatinib dose of 200 mg and that they worsened when she moved to the 300 mg dose. Her rash has greatly improved. Her SOB is always in conjunction with her fatigue and she feels that her chest has remained clear. Pt also noticed some foot swelling the other day that went away with elevation. Pt has gained about 10 lbs since beginning Imatinib. She has noticed her weight fluctuating up to 3-4 lbs within a single day and has a puffy face when she wakes up in the morning. She does not believe that she has been eating more.   Of note since the patient's last visit, pt has had BCR/ABL completed on 03/26/2019 with results revealing 0.0109%.  Lab results (03/26/2019) of CBC w/diff and CMP is as follows: all values are WNL except for Calcium at 8.8. 03/26/2019 LDH at 234  On review of systems, pt reports muscle cramping, joint pain, SOB, fatigue, diarrhea, constipation, foot swelling, weight gain and denies rash and any other symptoms.   MEDICAL HISTORY:  Past Medical History:  Diagnosis Date  . Anxiety   . Arthritis   . Cervical intraepithelial neoplasia grade 3 2002   s/p LEEP Rx repeat pap negative  . Cervical radiculopathy   . CML (chronic myelocytic leukemia) ([place]) 06/03/2018  . Fallopian tube disorder    right fallopian tube removed  . Gallstones 09/2015   On CT  . Headache    Migraines  . Hemorrhagic ovarian cyst 02/08/2016  . Hydrosalpinx    [facility]  . Hydrosalpinx 02/08/2016  . Interstitial cystitis   . Neurological abnormality    Neg workup with MRI/MRA in 2007 WNL except decreased caliber MCA proximally, distal cavernous portion of left LCA which may represent true stenosis or technique on exam. Evaluated by Dr Linus Salmons.  . Palpitations   . Partial seizures ([facility])    questionable diag  . Pneumonia     walking pneumonia  . Polycystic ovary    multiple ovarian cysts removed  . S/P cervical discectomy    Dr Vertell Limber, anterior discectomy C5-C7  . S/P epidural steroid injection    Right hip  . Seizures ([place]) 2005   simple partial seizure disorder    SURGICAL HISTORY: Past Surgical History:  Procedure Laterality Date  . APPENDECTOMY  1986  . BUNIONECTOMY Left    bunion removal  . CERVICAL DISC SURGERY  2005   C5-C7  . COLONOSCOPY    . DENTAL SURGERY    . fallopian tue removed    . LAPAROSCOPIC BILATERAL SALPINGECTOMY Left 02/09/2016   Procedure: LAPAROSCOPIC LEFT  SALPINGECTOMY;  Surgeon: Janyth Contes, MD;  Location: [facility];  Service: Gynecology;  Laterality: Left;  . LAPAROSCOPIC LYSIS OF ADHESIONS  02/09/2016   Procedure: LAPAROSCOPIC LYSIS OF ADHESIONS;  Surgeon: Janyth Contes, MD;  Location: [facility];  Service: Gynecology;;  momentum to adenexa  . LAPAROSCOPIC OVARIAN CYSTECTOMY Right 02/09/2016   Procedure: LAPAROSCOPIC OVARIAN  CYSTECTOMY;  Surgeon: Janyth Contes, MD;  Location: [facility];  Service: Gynecology;  Laterality: Right;  x2 to right ovary  . LEEP  1998  . OOPHORECTOMY Left 02/09/2016   Procedure: LEFT OOPHORECTOMY;  Surgeon: Janyth Contes, MD;  Location: [facility];  Service: Gynecology;  Laterality: Left;  . TONSILLECTOMY  2001  . UPPER GI ENDOSCOPY      SOCIAL HISTORY: Social History   Socioeconomic History  . Marital status: Married    Spouse name: Not on file  . Number of children: 0  . Years of education: Not on file  . Highest education level: Not on file  Occupational History  . 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  . Occupation: Pharmacist, hospital  Tobacco Use  . Smoking status: Former Smoker    Packs/day: 0.00    Years: 0.00    Pack years: 0.00    Types: Cigarettes    Quit date: 11/02/2015    Years since quitting: 3.4  . Smokeless tobacco: Never Used  . Tobacco comment:  currently smoking, began at beginning of June and plans to quit on June 14th. Has smoked off and on in the past.  Substance and Sexual Activity  . Alcohol use: No    Alcohol/week: 1.0 - 2.0 standard drinks    Types: 1 - 2 Standard drinks or equivalent per week    Comment: occasionally 0-2 per day  . Drug use: No  . Sexual activity: Yes  Other Topics Concern  . Not on file  Social History Narrative  . Not on file   Social Determinants of Health   Financial Resource Strain:   . Difficulty of Paying Living Expenses: Not on file  Food Insecurity:   . Worried About Charity fundraiser in the Last Year: Not on file  . Ran Out of Food in the Last Year: Not on file  Transportation Needs:   . Lack of Transportation (Medical): Not on file  . Lack of Transportation (Non-Medical): Not on file  Physical Activity:   . Days of Exercise per Week: Not on file  . Minutes of Exercise per Session: Not on file  Stress:   . Feeling of Stress : Not on file  Social Connections:   . Frequency of Communication with Friends and Family: Not on file  . Frequency of Social Gatherings with Friends and Family: Not on file  . Attends Religious Services: Not on file  . Active Member of Clubs or Organizations: Not on file  . Attends Archivist Meetings: Not on file  . Marital Status: Not on file  Intimate Partner Violence:   . Fear of Current or Ex-Partner: Not on file  . Emotionally Abused: Not on file  . Physically Abused: Not on file  . Sexually Abused: Not on file    FAMILY HISTORY: Family History  Adopted: Yes  Problem Relation Age of Onset  . COPD Mother   . Breast cancer Mother   . Other Mother        Teeth problems- all removed by age 47  . Rashes / Skin problems Sister   . Other Sister        Teeth problems- all removed by age 2  . Rashes / Skin problems Brother   . Asthma Brother   . Other Brother        Teeth problems- all removed by age 29    ALLERGIES:  is allergic to  clindamycin/lincomycin; codeine; cyclobenzaprine; gabapentin; nyquil multi-symptom [pseudoeph-doxylamine-dm-apap]; uribel [meth-hyo-m bl-na phos-ph sal]; xanax [alprazolam]; and urelle.  MEDICATIONS:  Current Outpatient Medications  Medication Sig Dispense Refill  . diazepam (VALIUM) 2 MG tablet Take 0.5-1 tablets (1-2 mg total) by mouth every 8 (eight) hours as needed for anxiety. 30 tablet 0  . famotidine (PEPCID) 20 MG tablet Take 20 mg by mouth 2 (two) times daily as needed (management of imatinib ADEs: nausea, diarrhea, muscle and joint pain).    Marland Kitchen imatinib (GLEEVEC) 100 MG tablet TAKE 3 TABLETS (300MG) BY MOUTH ONCE DAILY AT THE SAME TIME WITH FOOD AND A LARGE GLASS OF WATER. AVOID GRAPEFRUIT PRODUCTS. 90 tablet 0  . ketoconazole (NIZORAL) 2 % cream Apply topically 2 (two) times daily. to affected area    . loratadine (CLARITIN) 10 MG tablet Take 10 mg by mouth 2 (two) times daily as needed (management of imatinib ADEs: nausea, diarrhea, muscle and joint pain).     No current facility-administered medications for this visit.    REVIEW OF SYSTEMS:   A 10+ POINT REVIEW OF SYSTEMS WAS OBTAINED including neurology, dermatology, psychiatry, cardiac, respiratory, lymph, extremities, GI, GU, Musculoskeletal, constitutional, breasts, reproductive, HEENT.  All pertinent positives are noted in the HPI.  All others are negative.   PHYSICAL EXAMINATION: There were no vitals filed for this visit. Wt Readings from Last 3 Encounters:  02/03/19 180 lb (81.6 kg)  08/28/18 173 lb (78.5 kg)  07/25/18 175 lb 3.2 oz (79.5 kg)   There is no height or weight on file to calculate BMI.    Telehealth visit 04/09/19   LABORATORY DATA:  I have reviewed the data as listed  . CBC Latest Ref Rng & Units 03/26/2019 01/16/2019 12/05/2018  WBC 4.0 - 10.5 K/uL 7.4 6.8 7.3  Hemoglobin 12.0 - 15.0 g/dL 12.9 12.5 12.2  Hematocrit 36.0 - 46.0 % 37.2 35.9(L) 35.9(L)  Platelets 150 - 400 K/uL 268 259 263    . CBC    Component Value Date/Time   WBC 7.4 03/26/2019 1102   RBC 4.20 03/26/2019 1102   HGB 12.9 03/26/2019 1102   HGB 12.5 01/16/2019 1402   HGB 15.1 03/26/2017 1101   HCT 37.2 03/26/2019 1102   HCT 44.0 03/26/2017 1101   PLT 268 03/26/2019 1102   PLT 259 01/16/2019 1402   PLT 330 03/26/2017 1101   MCV 88.6 03/26/2019 1102   MCV 84 03/26/2017 1101   MCH 30.7 03/26/2019 1102   MCHC 34.7 03/26/2019 1102   RDW 13.2 03/26/2019 1102   RDW 14.0 03/26/2017 1101   LYMPHSABS 2.0 03/26/2019 1102   LYMPHSABS 2.8 03/26/2017 1101   MONOABS 0.6 03/26/2019 1102   EOSABS 0.4 03/26/2019 1102   EOSABS 0.4 03/26/2017 1101   BASOSABS 0.1 03/26/2019 1102   BASOSABS 0.2 03/26/2017 1101     . CMP Latest Ref Rng & Units 03/26/2019 01/16/2019 12/05/2018  Glucose 70 - 99 mg/dL 90 94 81  BUN 6 - 20 mg/dL _0 Creatinine 0.44 - 1.00 mg/dL 0.92 1.01(H) 1.08(H)  Sodium 135 - 145 mmol/L 139 141 139  Potassium 3.5 - 5.1 mmol/L 4.1 4.1 3.9  Chloride 98 - 111 mmol/L 104 105 105  CO2 22 - 32 mmol/L _1 Calcium 8.9 - 10.3 mg/dL 8.8(L) 9.0 8.9  Total Protein 6.5 - 8.1 g/dL 6.9 7.0 7.0  Total Bilirubin 0.3 - 1.2 mg/dL 0.4 0.4 0.4  Alkaline Phos 38 - 126 U/L 71 70 72  AST 15 - 41 U/L _0 ALT 0 - 44 U/L _1 03/26/2019 BCR/ABL:    01/28/2019 FISH:   07/25/18 Initial BCR-ABL:    06/03/18 FISH:   05/16/18 Cytogenetics:   04/10/18 Hemochromatosis Panel:    RADIOGRAPHIC STUDIES: I have personally reviewed the radiological images as listed and agreed with the findings in the report. No results found.  01/31/18 ECHO Transthoracic:    ASSESSMENT & PLAN:  50 y.o. female with  1. CML- Chronic phase 05/16/18 BM Cytogenetics revealed translocation 9;22. FISH report revealed BCR-ABL gene rearrangement present in 86.5% of cells 06/03/18 FISH revealed BCR-ABL gene rearrangement present in 85.67% of cells, with some hypercellularity around 80% 06/05/18 US Abdomen revealed  normal spleen size 01/31/18 ECHO revealed LV EF of 63%, no regional wall motion abnormalities, no significant valvular hear disease  Initial pre-treatment 07/25/18 BCR-ABL revealed IS at 71.1362%  08/28/18 EKG reviewed  2. 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 04/10/18 Hemochromatosis DNA, PCR revealed a single mutation of H63D  PLAN: -Discussed pt labwork, 03/26/19; blood counts and chemistries are NML -Discussed 03/26/2019 LDH steady at 234 -Discussed 03/26/2019 BCR/ABL at 0.0109% and her continues to be in MMR -Pt is currently in major molecular remission -Pt has Grade 2 fatigue, joint pain, bowel symptoms  -Will consider a Chest CT and ECHO if SOB continues -Will consider adding in a diuretic if SOB and fluid retention continues -Will test thyroid levels with next labs due to symptoms and known medication side effects -Due to Grade 2 symptoms will lower dosage of Imatinib to 200 mg per day  -Pt has no prohibitive toxicities from continuing 200 mg Imatinib at this time -Will repeat BCR/ABL at next labs due to dosage change -Advised pt that if she notices increased SOB, leg swelling, weight gain or her symptoms continue to persist to contact immediately -Advised pt to begin taking Valium as prescribed to improve muscle cramps  -Will Rx Tizanidine if Valium does not work well enough -Will see back in 2 months, with labs 2 weeks prior  FOLLOW UP: Labs in 6 weeks Phone with Dr Irene Limbo in 8 weeks  The total time spent in the appt was 20 minutes and more than 50% was on counseling and direct patient cares.  All of the patient's questions were answered with apparent satisfaction. The patient knows to call the clinic with any problems, questions or concerns.   Sullivan Lone MD [place] AAHIVMS [facility] Hematology/Oncology Physician [facility]  (Office):       [phone] (Work cell):  [phone] (Fax):           [phone]  04/09/2019 4:33 AM  I, Yevette Edwards, am acting as a  scribe for Dr. Sullivan Lone.   .I have reviewed the above documentation for accuracy and completeness, and I agree with the above. Brunetta Genera MD

## 2019-04-10 ENCOUNTER — Telehealth: Payer: Self-pay | Admitting: Hematology

## 2019-04-10 DIAGNOSIS — F0634 Mood disorder due to known physiological condition with mixed features: Secondary | ICD-10-CM | POA: Diagnosis not present

## 2019-04-10 NOTE — Telephone Encounter (Signed)
Scheduled appt per 1/6 los.  Sent a message to the HIM pool message to get a calendar mailed out.

## 2019-05-06 ENCOUNTER — Telehealth: Payer: Self-pay | Admitting: Pharmacy Technician

## 2019-05-06 DIAGNOSIS — F0634 Mood disorder due to known physiological condition with mixed features: Secondary | ICD-10-CM | POA: Diagnosis not present

## 2019-05-06 NOTE — Telephone Encounter (Signed)
Oral Oncology Patient Advocate Encounter  Received notification from [place] that prior authorization for [place] is required.  PA submitted on CoverMyMeds Key [id number] Status is pending  Oral Oncology Clinic will continue to follow.  [place] Patient [place] Phone [phone] Fax [phone] 05/06/2019 12:48 PM

## 2019-05-07 NOTE — Telephone Encounter (Signed)
Oral Oncology Patient Advocate Encounter  Prior Authorization for [place] (Imatinib) has been approved.    PA# [id number] Effective dates: 05/07/19 through 05/06/20  Oral Oncology Clinic will continue to follow.   [place] Patient [place] Phone [phone] Fax [phone] 05/07/2019 10:20 AM

## 2019-05-21 ENCOUNTER — Inpatient Hospital Stay: Payer: BC Managed Care – PPO | Attending: Family

## 2019-05-21 ENCOUNTER — Other Ambulatory Visit: Payer: Self-pay

## 2019-05-21 DIAGNOSIS — C9210 Chronic myeloid leukemia, BCR/ABL-positive, not having achieved remission: Secondary | ICD-10-CM | POA: Diagnosis not present

## 2019-05-21 DIAGNOSIS — M2550 Pain in unspecified joint: Secondary | ICD-10-CM | POA: Diagnosis not present

## 2019-05-21 DIAGNOSIS — Z79899 Other long term (current) drug therapy: Secondary | ICD-10-CM | POA: Diagnosis not present

## 2019-05-21 DIAGNOSIS — R5383 Other fatigue: Secondary | ICD-10-CM | POA: Insufficient documentation

## 2019-05-21 LAB — CBC WITH DIFFERENTIAL/PLATELET
Abs Immature Granulocytes: 0.01 10*3/uL (ref 0.00–0.07)
Basophils Absolute: 0.1 10*3/uL (ref 0.0–0.1)
Basophils Relative: 1 %
Eosinophils Absolute: 0.4 10*3/uL (ref 0.0–0.5)
Eosinophils Relative: 5 %
HCT: 38.3 % (ref 36.0–46.0)
Hemoglobin: 13.4 g/dL (ref 12.0–15.0)
Immature Granulocytes: 0 %
Lymphocytes Relative: 30 %
Lymphs Abs: 2.1 10*3/uL (ref 0.7–4.0)
MCH: 30.8 pg (ref 26.0–34.0)
MCHC: 35 g/dL (ref 30.0–36.0)
MCV: 88 fL (ref 80.0–100.0)
Monocytes Absolute: 0.6 10*3/uL (ref 0.1–1.0)
Monocytes Relative: 8 %
Neutro Abs: 4 10*3/uL (ref 1.7–7.7)
Neutrophils Relative %: 56 %
Platelets: 276 10*3/uL (ref 150–400)
RBC: 4.35 MIL/uL (ref 3.87–5.11)
RDW: 12.5 % (ref 11.5–15.5)
WBC: 7.1 10*3/uL (ref 4.0–10.5)
nRBC: 0 % (ref 0.0–0.2)

## 2019-05-21 LAB — CMP (CANCER CENTER ONLY)
ALT: 31 U/L (ref 0–44)
AST: 23 U/L (ref 15–41)
Albumin: 4.3 g/dL (ref 3.5–5.0)
Alkaline Phosphatase: 69 U/L (ref 38–126)
Anion gap: 9 (ref 5–15)
BUN: 11 mg/dL (ref 6–20)
CO2: 25 mmol/L (ref 22–32)
Calcium: 9 mg/dL (ref 8.9–10.3)
Chloride: 106 mmol/L (ref 98–111)
Creatinine: 0.86 mg/dL (ref 0.44–1.00)
GFR, Est AFR Am: >60 mL/min (ref >60)
GFR, Estimated: >60 mL/min (ref >60)
Glucose, Bld: 90 mg/dL (ref 70–99)
Potassium: 4.1 mmol/L (ref 3.5–5.1)
Sodium: 140 mmol/L (ref 135–145)
Total Bilirubin: 0.5 mg/dL (ref 0.3–1.2)
Total Protein: 7 g/dL (ref 6.5–8.1)

## 2019-05-21 LAB — T4, FREE: Free T4: 0.8 ng/dL (ref 0.61–1.12)

## 2019-05-21 LAB — TSH: TSH: 1.598 u[IU]/mL (ref 0.308–3.960)

## 2019-05-29 LAB — BCR/ABL

## 2019-05-30 DIAGNOSIS — F0634 Mood disorder due to known physiological condition with mixed features: Secondary | ICD-10-CM | POA: Diagnosis not present

## 2019-06-04 ENCOUNTER — Inpatient Hospital Stay: Payer: BC Managed Care – PPO | Attending: Family | Admitting: Hematology

## 2019-06-04 ENCOUNTER — Telehealth: Payer: Self-pay

## 2019-06-04 DIAGNOSIS — C9210 Chronic myeloid leukemia, BCR/ABL-positive, not having achieved remission: Secondary | ICD-10-CM | POA: Diagnosis not present

## 2019-06-04 NOTE — Telephone Encounter (Signed)
Oral Oncology Patient Advocate Encounter  Received notification from [facility] that prior authorization for brand name Gleevec is required.  PA submitted on CoverMyMeds Key [id number] Status is pending  Oral Oncology Clinic will continue to follow.  [place] Patient [place] Phone [phone] Fax [phone] 06/04/2019 4:50 PM

## 2019-06-04 NOTE — Progress Notes (Signed)
HEMATOLOGY/ONCOLOGY CLINIC NOTE  Date of Service: 06/04/2019  Patient Care Team: Copland, Gay Filler, MD as PCP - General (Family Medicine)  CHIEF COMPLAINTS/PURPOSE OF CONSULTATION:  Chronic Myeloid Leukemia  HISTORY OF PRESENTING ILLNESS:   Victoria Hall is a wonderful 49 y.o. female who has been referred to [place] by Dr. Silvestre Mesi for evaluation and management of CML. The pt was formerly under the care of my colleague Dr. Burney Gauze, and has transferred her care closer to home. The pt reports that she is doing well overall.  Prior to today's visit, the pt was recently diagnosed with CML after a 05/16/18 bone marrow biopsy revealed a translocation 9;22 and FISH study revealed BCR-ABL gene rearranagement in 85% of cells. The pt was prescribed '400mg'$  Imatinib with Dr. Marin Olp on 06/03/18.  The pt reports that she had intermittent blurry vision for the last 4 years. She notes that she had night sweats for the last 20 years. She notes that her WBC have been elevated for the last 4 years. She notes that she has had elevated Basophils since April 2018. She has been evaluated for mast cell activation, and has had normal tryptase levels, including most recently on 06/03/18.  She reports that she went to the [facility] in October 2019 for suspicions of dysautonomia. The pt notes that she has "exercise intolerance," for the last 4 years, which she describes as needing to "lie on the couch for 2 weeks," after taking a run.  She has had intermittent rashes and hives for the last 20 years, which do resolve on their own. She keeps a food and activity journal for the last 2 years, and denies awareness of any associations with her rash onset. She has seen dermatology and immunology regarding this. The pt has also seen a rheumatologist for evaluation.  The pt notes that her hands and feet frequently fall asleep, and has been evaluated by neurology with an NCV. She notes that she has cervical  radiculopathy which she relates to her 2005 C5-7 fusion. She endorses exhaustion which "comes on out of no where." She notes her energy levels change "from minute to minute."   The pt denies abdominal pains or mouth sores.   The pt endorse frequent muscle and joint cramping as well. She has tried using CBD oil which she found to be helpful, but notes that the cost became prohibitive.  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  Most recent lab results (06/03/18) of CBC w/diff and CMP is as follows: all values are WNL except for WBC at 18.8k, ANC at 12.9k Basophils abs at 600, Abs immature granulocytes at 1.02k 06/24/18 LDH at 237  On review of systems, pt reports intermittent sudden-onset fatigue, intermittent headaches, intermittent hives and rashes, muscle and joint cramping, and denies abdominal pains, mouth sores, leg swelling, and any other symptoms.   On PMHx the pt reports chronic cervical radiculopathy with fusion of C5-7 in 2005. 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 She endorses concern for chemical exposure related to her hobbies of painting and furniture restoration.  Interval History:   I connected with  [name] on 06/04/19 by telephone and verified that I am speaking with the correct person using two identifiers.   I discussed the limitations of evaluation and management by telemedicine. The patient expressed understanding and agreed to proceed.  Other persons participating in the  visit and their role in the encounter:       -Yevette Edwards, Medical Scribe      -Pt's husband Patient's location: Home Provider's location: [place] [facility] is a wonderful 49 y.o. female who is here for evaluation and management of CML The patient's last visit with [place] was on 04/09/2019.  The pt reports that the muscle cramps that were so painful that they would wake  her up at night have improved with dose reduction. She still has some mild leg cramping during the day, diarrhea 1-2 times per week, and significant fatigue. At its worst she has been having runny stools 2-3 times per day. She has tried Imodium to help with her diarrhea but it often leads to constipation. She does not attribute her diarrhea to any specific dietary choices. Pt still has SOB with activity. Although she was experiencing this prior her diagnosis of CML, it has somewhat improved after the reduction in the dose of Imatinib. Pt is still having a fair amount of fatigue and it is stopping her from doing as much activity as she would like to do. Pt used her Valium twice but did not notice a difference so she discontinued taking it.  Patient notes she still notes the adverse effects are affecting her QOL and she is keen to switch to brand name Gleevac instead of the generic Imatinib to see if it helps her adverse effects prior to considering switching to a different drug.  Lab results (05/21/19) of CBC w/diff and CMP is as follows: all values are WNL. 05/21/2019 TSH at 1.598 05/21/2019 Free T4 at 0.80 05/21/2019 BCR/ABL1 at 0.0341, pt in MMR  On review of systems, pt reports fatigue, leg cramping, diarrhea, SOB, leg swelling and denies skin rashes any other symptoms.   MEDICAL HISTORY:  Past Medical History:  Diagnosis Date  . Anxiety   . Arthritis   . Cervical intraepithelial neoplasia grade 3 2002   s/p LEEP Rx repeat pap negative  . Cervical radiculopathy   . CML (chronic myelocytic leukemia) ([place]) 06/03/2018  . Fallopian tube disorder    right fallopian tube removed  . Gallstones 09/2015   On CT  . Headache    Migraines  . Hemorrhagic ovarian cyst 02/08/2016  . Hydrosalpinx    [facility]  . Hydrosalpinx 02/08/2016  . Interstitial cystitis   . Neurological abnormality    Neg workup with MRI/MRA in 2007 WNL except decreased caliber MCA proximally, distal cavernous  portion of left LCA which may represent true stenosis or technique on exam. Evaluated by Dr Linus Salmons.  . Palpitations   . Partial seizures (HCC)    questionable diag  . Pneumonia    walking pneumonia  . Polycystic ovary    multiple ovarian cysts removed  . S/P cervical discectomy    Dr Vertell Limber, anterior discectomy C5-C7  . S/P epidural steroid injection    Right hip  . Seizures ([place]) 2005   simple partial seizure disorder    SURGICAL HISTORY: Past Surgical History:  Procedure Laterality Date  . APPENDECTOMY  1986  . BUNIONECTOMY Left    bunion removal  . CERVICAL DISC SURGERY  2005   C5-C7  . COLONOSCOPY    . DENTAL SURGERY    . fallopian tue removed    . LAPAROSCOPIC BILATERAL SALPINGECTOMY Left 02/09/2016   Procedure: LAPAROSCOPIC LEFT  SALPINGECTOMY;  Surgeon: Janyth Contes, MD;  Location: [facility];  Service: Gynecology;  Laterality: Left;  .  LAPAROSCOPIC LYSIS OF ADHESIONS  02/09/2016   Procedure: LAPAROSCOPIC LYSIS OF ADHESIONS;  Surgeon: Janyth Contes, MD;  Location: [facility];  Service: Gynecology;;  momentum to adenexa  . LAPAROSCOPIC OVARIAN CYSTECTOMY Right 02/09/2016   Procedure: LAPAROSCOPIC OVARIAN CYSTECTOMY;  Surgeon: Janyth Contes, MD;  Location: [facility];  Service: Gynecology;  Laterality: Right;  x2 to right ovary  . LEEP  1998  . OOPHORECTOMY Left 02/09/2016   Procedure: LEFT OOPHORECTOMY;  Surgeon: Janyth Contes, MD;  Location: [facility];  Service: Gynecology;  Laterality: Left;  . TONSILLECTOMY  2001  . UPPER GI ENDOSCOPY      SOCIAL HISTORY: Social History   Socioeconomic History  . Marital status: Married    Spouse name: Not on file  . Number of children: 0  . Years of education: Not on file  . Highest education level: Not on file  Occupational History  . 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  . Occupation: Pharmacist, hospital  Tobacco Use  . Smoking status: Former Smoker     Packs/day: 0.00    Years: 0.00    Pack years: 0.00    Types: Cigarettes    Quit date: 11/02/2015    Years since quitting: 3.5  . Smokeless tobacco: Never Used  . Tobacco comment: currently smoking, began at beginning of June and plans to quit on June 14th. Has smoked off and on in the past.  Substance and Sexual Activity  . Alcohol use: No    Alcohol/week: 1.0 - 2.0 standard drinks    Types: 1 - 2 Standard drinks or equivalent per week    Comment: occasionally 0-2 per day  . Drug use: No  . Sexual activity: Yes  Other Topics Concern  . Not on file  Social History Narrative  . Not on file   Social Determinants of Health   Financial Resource Strain:   . Difficulty of Paying Living Expenses: Not on file  Food Insecurity:   . Worried About Charity fundraiser in the Last Year: Not on file  . Ran Out of Food in the Last Year: Not on file  Transportation Needs:   . Lack of Transportation (Medical): Not on file  . Lack of Transportation (Non-Medical): Not on file  Physical Activity:   . Days of Exercise per Week: Not on file  . Minutes of Exercise per Session: Not on file  Stress:   . Feeling of Stress : Not on file  Social Connections:   . Frequency of Communication with Friends and Family: Not on file  . Frequency of Social Gatherings with Friends and Family: Not on file  . Attends Religious Services: Not on file  . Active Member of Clubs or Organizations: Not on file  . Attends Archivist Meetings: Not on file  . Marital Status: Not on file  Intimate Partner Violence:   . Fear of Current or Ex-Partner: Not on file  . Emotionally Abused: Not on file  . Physically Abused: Not on file  . Sexually Abused: Not on file    FAMILY HISTORY: Family History  Adopted: Yes  Problem Relation Age of Onset  . COPD Mother   . Breast cancer Mother   . Other Mother        Teeth problems- all removed by age 1  . Rashes / Skin problems Sister   . Other Sister        Teeth  problems- all removed  by age 35  . Rashes / Skin problems Brother   . Asthma Brother   . Other Brother        Teeth problems- all removed by age 66    ALLERGIES:  is allergic to clindamycin/lincomycin; codeine; cyclobenzaprine; gabapentin; nyquil multi-symptom [pseudoeph-doxylamine-dm-apap]; uribel [meth-hyo-m bl-na phos-ph sal]; xanax [alprazolam]; and urelle.  MEDICATIONS:  Current Outpatient Medications  Medication Sig Dispense Refill  . diazepam (VALIUM) 2 MG tablet Take 0.5-1 tablets (1-2 mg total) by mouth every 8 (eight) hours as needed for anxiety. 30 tablet 0  . famotidine (PEPCID) 20 MG tablet Take 20 mg by mouth 2 (two) times daily as needed (management of imatinib ADEs: nausea, diarrhea, muscle and joint pain).    Marland Kitchen imatinib (GLEEVEC) 100 MG tablet Take 2 tablets (200 mg total) by mouth daily. Take with meals and large glass of water.Caution:Chemotherapy 60 tablet 2  . ketoconazole (NIZORAL) 2 % cream Apply topically 2 (two) times daily. to affected area    . loratadine (CLARITIN) 10 MG tablet Take 10 mg by mouth 2 (two) times daily as needed (management of imatinib ADEs: nausea, diarrhea, muscle and joint pain).     No current facility-administered medications for this visit.    REVIEW OF SYSTEMS:   A 10+ POINT REVIEW OF SYSTEMS WAS OBTAINED including neurology, dermatology, psychiatry, cardiac, respiratory, lymph, extremities, GI, GU, Musculoskeletal, constitutional, breasts, reproductive, HEENT.  All pertinent positives are noted in the HPI.  All others are negative.   PHYSICAL EXAMINATION: There were no vitals filed for this visit. Wt Readings from Last 3 Encounters:  02/03/19 180 lb (81.6 kg)  08/28/18 173 lb (78.5 kg)  07/25/18 175 lb 3.2 oz (79.5 kg)   There is no height or weight on file to calculate BMI.    Telehealth visit 06/04/19   LABORATORY DATA:  I have reviewed the data as listed  . CBC Latest Ref Rng & Units 05/21/2019 03/26/2019 01/16/2019  WBC  4.0 - 10.5 K/uL 7.1 7.4 6.8  Hemoglobin 12.0 - 15.0 g/dL 13.4 12.9 12.5  Hematocrit 36.0 - 46.0 % 38.3 37.2 35.9(L)  Platelets 150 - 400 K/uL 276 268 259   . CBC    Component Value Date/Time   WBC 7.1 05/21/2019 1132   RBC 4.35 05/21/2019 1132   HGB 13.4 05/21/2019 1132   HGB 12.5 01/16/2019 1402   HGB 15.1 03/26/2017 1101   HCT 38.3 05/21/2019 1132   HCT 44.0 03/26/2017 1101   PLT 276 05/21/2019 1132   PLT 259 01/16/2019 1402   PLT 330 03/26/2017 1101   MCV 88.0 05/21/2019 1132   MCV 84 03/26/2017 1101   MCH 30.8 05/21/2019 1132   MCHC 35.0 05/21/2019 1132   RDW 12.5 05/21/2019 1132   RDW 14.0 03/26/2017 1101   LYMPHSABS 2.1 05/21/2019 1132   LYMPHSABS 2.8 03/26/2017 1101   MONOABS 0.6 05/21/2019 1132   EOSABS 0.4 05/21/2019 1132   EOSABS 0.4 03/26/2017 1101   BASOSABS 0.1 05/21/2019 1132   BASOSABS 0.2 03/26/2017 1101     . CMP Latest Ref Rng & Units 05/21/2019 03/26/2019 01/16/2019  Glucose 70 - 99 mg/dL 90 90 94  BUN 6 - 20 mg/dL '11 10 10  '$ Creatinine 0.44 - 1.00 mg/dL 0.86 0.92 1.01(H)  Sodium 135 - 145 mmol/L 140 139 141  Potassium 3.5 - 5.1 mmol/L 4.1 4.1 4.1  Chloride 98 - 111 mmol/L 106 104 105  CO2 22 - 32 mmol/L '25 27 29  '$ Calcium 8.9 -  10.3 mg/dL 9.0 8.8(L) 9.0  Total Protein 6.5 - 8.1 g/dL 7.0 6.9 7.0  Total Bilirubin 0.3 - 1.2 mg/dL 0.5 0.4 0.4  Alkaline Phos 38 - 126 U/L 69 71 70  AST 15 - 41 U/L '23 30 21  '$ ALT 0 - 44 U/L '31 28 22   '$ 05/21/2019 BCR/ABL:    03/26/2019 BCR/ABL:    01/28/2019 FISH:   07/25/18 Initial BCR-ABL:    06/03/18 FISH:   05/16/18 Cytogenetics:   04/10/18 Hemochromatosis Panel:    RADIOGRAPHIC STUDIES: I have personally reviewed the radiological images as listed and agreed with the findings in the report. No results found.  01/31/18 ECHO Transthoracic:    ASSESSMENT & PLAN:   49 y.o. female with  1. CML- Chronic phase 05/16/18 BM Cytogenetics revealed translocation 9;22. 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 TSH and Free T4 is WNL.  -Discussed 05/21/2019 BCR/ABL1 at 0.0341. Pt is still in major molecular remission. -If BCR/ABL continues to rise may have to consider a different medication, as Imatinib at a higher dosage has significant toxicities for the pt.  -Pt has no prohibitive toxicities from continuing 200 mg Imatinib at this time but still with significant fatigue, muscle cramping and diarrhea. Patient has done her own research and is keen to try brand name Gleevac prior to considerations of switching to different TKI from toxicities standpoint. -Will allow pt to try brand name Gleevac to be sure that pt is not having any idiosyncratic reactions to the current version of Imatinib -Recommend pt receive the COVID19 vaccine when available -Advised pt to space out the timing of her COVID19 vaccine and the switch in medication so as to not have too many confounding factors at one time  -Rx Gleevac, Tizanidine -Will see back in 3 months, with labs one week prior   FOLLOW UP: Labs in 6 weeks Phone with Dr Irene Limbo in 8 weeks   All of the patient's questions were answered with apparent satisfaction. The patient knows to call the clinic with any problems, questions or concerns.   Sullivan Lone MD [place] AAHIVMS [facility] Hematology/Oncology Physician [facility]  (Office):       [phone] (Work cell):   [phone] (Fax):           [phone]  06/04/2019 4:29 PM  I, Yevette Edwards, am acting as a scribe for Dr. Sullivan Lone.   .I have reviewed the above documentation for accuracy and completeness, and I agree with the above. Brunetta Genera MD

## 2019-06-06 NOTE — Telephone Encounter (Signed)
Oral Oncology Patient Advocate Encounter  Received notification from [employer] [employer] that the request for prior authorization for Gleevec has been denied    This determination is currently being appealed.      This encounter will continue to be updated until final determination.    [facility] [facility] [place] Phone [phone] Fax [phone] 06/06/2019 10:40 AM

## 2019-06-10 ENCOUNTER — Telehealth: Payer: Self-pay | Admitting: Hematology

## 2019-06-10 MED ORDER — TIZANIDINE HCL 2 MG PO CAPS: 2.0000 mg | ORAL_CAPSULE | Freq: Three times a day (TID) | ORAL | 1 refills | Status: DC | PRN

## 2019-06-10 NOTE — Telephone Encounter (Signed)
I talk with patient regarding schedule  

## 2019-06-16 NOTE — Telephone Encounter (Signed)
Oral Chemotherapy Pharmacist Encounter   Received fax notification at the office that the appeal was denied for brand Gleevec. The insurance noted in there letter that Sprycel or Busulif could be used at alternatives on there formulary. Called Ms. Buchcanan to let her know about the determination. She want me to note that she was not interested in the switching to another CML medication due to side effect concerns.  Darl Pikes, PharmD, BCPS, BCOP, CPP Hematology/Oncology Clinical Pharmacist [facility]/AP [facility] [phone]  06/16/2019 11:35 AM

## 2019-06-24 ENCOUNTER — Encounter: Payer: Self-pay | Admitting: Family Medicine

## 2019-06-24 ENCOUNTER — Other Ambulatory Visit: Payer: Self-pay

## 2019-06-25 ENCOUNTER — Other Ambulatory Visit: Payer: Self-pay | Admitting: Hematology

## 2019-06-25 DIAGNOSIS — F0634 Mood disorder due to known physiological condition with mixed features: Secondary | ICD-10-CM | POA: Diagnosis not present

## 2019-06-25 DIAGNOSIS — C9210 Chronic myeloid leukemia, BCR/ABL-positive, not having achieved remission: Secondary | ICD-10-CM

## 2019-06-26 ENCOUNTER — Encounter: Payer: Self-pay | Admitting: Hematology

## 2019-07-18 DIAGNOSIS — F0634 Mood disorder due to known physiological condition with mixed features: Secondary | ICD-10-CM | POA: Diagnosis not present

## 2019-07-22 ENCOUNTER — Other Ambulatory Visit: Payer: Self-pay

## 2019-07-22 ENCOUNTER — Inpatient Hospital Stay: Payer: BC Managed Care – PPO | Attending: Family

## 2019-07-22 DIAGNOSIS — C9210 Chronic myeloid leukemia, BCR/ABL-positive, not having achieved remission: Secondary | ICD-10-CM | POA: Diagnosis not present

## 2019-07-22 LAB — CMP (CANCER CENTER ONLY)
ALT: 27 U/L (ref 0–44)
AST: 23 U/L (ref 15–41)
Albumin: 4.1 g/dL (ref 3.5–5.0)
Alkaline Phosphatase: 69 U/L (ref 38–126)
Anion gap: 6 (ref 5–15)
BUN: 9 mg/dL (ref 6–20)
CO2: 27 mmol/L (ref 22–32)
Calcium: 9.4 mg/dL (ref 8.9–10.3)
Chloride: 105 mmol/L (ref 98–111)
Creatinine: 0.96 mg/dL (ref 0.44–1.00)
GFR, Est AFR Am: >60 mL/min (ref >60)
GFR, Estimated: >60 mL/min (ref >60)
Glucose, Bld: 97 mg/dL (ref 70–99)
Potassium: 4.4 mmol/L (ref 3.5–5.1)
Sodium: 138 mmol/L (ref 135–145)
Total Bilirubin: 0.5 mg/dL (ref 0.3–1.2)
Total Protein: 7.1 g/dL (ref 6.5–8.1)

## 2019-07-22 LAB — CBC WITH DIFFERENTIAL/PLATELET
Abs Immature Granulocytes: 0.02 10*3/uL (ref 0.00–0.07)
Basophils Absolute: 0.1 10*3/uL (ref 0.0–0.1)
Basophils Relative: 1 %
Eosinophils Absolute: 0.4 10*3/uL (ref 0.0–0.5)
Eosinophils Relative: 6 %
HCT: 39.7 % (ref 36.0–46.0)
Hemoglobin: 13.7 g/dL (ref 12.0–15.0)
Immature Granulocytes: 0 %
Lymphocytes Relative: 32 %
Lymphs Abs: 2.3 10*3/uL (ref 0.7–4.0)
MCH: 30.5 pg (ref 26.0–34.0)
MCHC: 34.5 g/dL (ref 30.0–36.0)
MCV: 88.4 fL (ref 80.0–100.0)
Monocytes Absolute: 0.5 10*3/uL (ref 0.1–1.0)
Monocytes Relative: 8 %
Neutro Abs: 3.8 10*3/uL (ref 1.7–7.7)
Neutrophils Relative %: 53 %
Platelets: 282 10*3/uL (ref 150–400)
RBC: 4.49 MIL/uL (ref 3.87–5.11)
RDW: 12.8 % (ref 11.5–15.5)
WBC: 7.1 10*3/uL (ref 4.0–10.5)
nRBC: 0 % (ref 0.0–0.2)

## 2019-07-22 LAB — IRON AND TIBC
Iron: 99 ug/dL (ref 41–142)
Saturation Ratios: 31 % (ref 21–57)
TIBC: 319 ug/dL (ref 236–444)
UIBC: 220 ug/dL (ref 120–384)

## 2019-07-22 LAB — FERRITIN: Ferritin: 61 ng/mL (ref 11–307)

## 2019-07-22 NOTE — Patient Instructions (Addendum)
It was great to see you again today Please plan for a mammogram and colon cancer screening later this year I am glad to refer you to sports medicine at your convenience for your sternal pain We ordered a chest film for you to today to evaluate for possible hiatal hernia  You can try adderall 5- 10 mg twice a day (am and lunch/ early afternoon) as needed for fatigue.  Try the lower dose first.     Health Maintenance, Female Adopting a healthy lifestyle and getting preventive care are important in promoting health and wellness. Ask your health care provider about:  The right schedule for you to have regular tests and exams.  Things you can do on your own to prevent diseases and keep yourself healthy. What should I know about diet, weight, and exercise? Eat a healthy diet   Eat a diet that includes plenty of vegetables, fruits, low-fat dairy products, and lean protein.  Do not eat a lot of foods that are high in solid fats, added sugars, or sodium. Maintain a healthy weight Body mass index (BMI) is used to identify weight problems. It estimates body fat based on height and weight. Your health care provider can help determine your BMI and help you achieve or maintain a healthy weight. Get regular exercise Get regular exercise. This is one of the most important things you can do for your health. Most adults should:  Exercise for at least 150 minutes each week. The exercise should increase your heart rate and make you sweat (moderate-intensity exercise).  Do strengthening exercises at least twice a week. This is in addition to the moderate-intensity exercise.  Spend less time sitting. Even light physical activity can be beneficial. Watch cholesterol and blood lipids Have your blood tested for lipids and cholesterol at 50 years of age, then have this test every 5 years. Have your cholesterol levels checked more often if:  Your lipid or cholesterol levels are high.  You are older than 50  years of age.  You are at high risk for heart disease. What should I know about cancer screening? Depending on your health history and family history, you may need to have cancer screening at various ages. This may include screening for:  Breast cancer.  Cervical cancer.  Colorectal cancer.  Skin cancer.  Lung cancer. What should I know about heart disease, diabetes, and high blood pressure? Blood pressure and heart disease  High blood pressure causes heart disease and increases the risk of stroke. This is more likely to develop in people who have high blood pressure readings, are of African descent, or are overweight.  Have your blood pressure checked: ? Every 3-5 years if you are 24-6 years of age. ? Every year if you are 49 years old or older. Diabetes Have regular diabetes screenings. This checks your fasting blood sugar level. Have the screening done:  Once every three years after age 20 if you are at a normal weight and have a low risk for diabetes.  More often and at a younger age if you are overweight or have a high risk for diabetes. What should I know about preventing infection? Hepatitis B If you have a higher risk for hepatitis B, you should be screened for this virus. Talk with your health care provider to find out if you are at risk for hepatitis B infection. Hepatitis C Testing is recommended for:  Everyone born from [birth year] through 1966.  Anyone with known risk factors for hepatitis  C. Sexually transmitted infections (STIs)  Get screened for STIs, including gonorrhea and chlamydia, if: ? You are sexually active and are younger than 50 years of age. ? You are older than 50 years of age and your health care provider tells you that you are at risk for this type of infection. ? Your sexual activity has changed since you were last screened, and you are at increased risk for chlamydia or gonorrhea. Ask your health care provider if you are at risk.  Ask your health  care provider about whether you are at high risk for HIV. Your health care provider may recommend a prescription medicine to help prevent HIV infection. If you choose to take medicine to prevent HIV, you should first get tested for HIV. You should then be tested every 3 months for as long as you are taking the medicine. Pregnancy  If you are about to stop having your period (premenopausal) and you may become pregnant, seek counseling before you get pregnant.  Take 400 to 800 micrograms (mcg) of folic acid every day if you become pregnant.  Ask for birth control (contraception) if you want to prevent pregnancy. Osteoporosis and menopause Osteoporosis is a disease in which the bones lose minerals and strength with aging. This can result in bone fractures. If you are 3 years old or older, or if you are at risk for osteoporosis and fractures, ask your health care provider if you should:  Be screened for bone loss.  Take a calcium or vitamin D supplement to lower your risk of fractures.  Be given hormone replacement therapy (HRT) to treat symptoms of menopause. Follow these instructions at home: Lifestyle  Do not use any products that contain nicotine or tobacco, such as cigarettes, e-cigarettes, and chewing tobacco. If you need help quitting, ask your health care provider.  Do not use street drugs.  Do not share needles.  Ask your health care provider for help if you need support or information about quitting drugs. Alcohol use  Do not drink alcohol if: ? Your health care provider tells you not to drink. ? You are pregnant, may be pregnant, or are planning to become pregnant.  If you drink alcohol: ? Limit how much you use to 0-1 drink a day. ? Limit intake if you are breastfeeding.  Be aware of how much alcohol is in your drink. In the [place] one drink equals one 12 oz bottle of beer (355 mL), one 5 oz glass of wine (148 mL), or one 1 oz glass of hard liquor (44 mL). General  instructions  Schedule regular health, dental, and eye exams.  Stay current with your vaccines.  Tell your health care provider if: ? You often feel depressed. ? You have ever been abused or do not feel safe at home. Summary  Adopting a healthy lifestyle and getting preventive care are important in promoting health and wellness.  Follow your health care provider's instructions about healthy diet, exercising, and getting tested or screened for diseases.  Follow your health care provider's instructions on monitoring your cholesterol and blood pressure. This information is not intended to replace advice given to you by your health care provider. Make sure you discuss any questions you have with your health care provider. Document Revised: 03/13/2018 Document Reviewed: 03/13/2018 Elsevier Patient Education  2020 [employer].

## 2019-07-22 NOTE — Progress Notes (Signed)
Victoria Hall at [name] [place], [place], [place] [address] [phone]  Date:  07/23/2019   Name:  Victoria Hall   DOB:  28-May-1970   MRN:  [id number]  PCP:  Darreld Mclean, MD    Chief Complaint: Annual Exam   History of Present Illness:  Victoria Hall is a 49 y.o. very pleasant female patient who presents with the following:  Here today for complete physical Patient with history of CML, POTS, mast cell activation syndrome, hypermobility, hemochromatosis, POTS Last seen by myself in May 2020 She has generally been staying in during the pandemic  Her lead oncologist is Dr. Irene Limbo with [facility]; at her most recent visit in March she was in molecular remission They are trying to get her brand name Gleevac which is a big struggle- she would like to try this and see if she might have fewer SE when she currently has with generic  She continues to complain of severe fatigue, this has been an issue for her for several years.  Any physical activity will cause her to become exhausted, though she has no chest pain or shortness of breath.  She is not sleepy or somnolent, just tired.  She notes that occasionally she is able to do more physical activity and feels fine, but most often she is exhausted after even light exercise or housework  COVID-19 vaccine; she completed this on 4/7.  The 2nd shot caused a low grade fever and diarrhea This is now coming and going She feels like the skin on the right side of her abdomen is hot to the touch, she also feels that on occasion she will feel something slip through her diaphragm, perhaps a hiatal hernia.  This only occurs when she gets in a certain position, and she can resolve it by standing up straight  She did have an appendectomy as a teen so no concern of appendicitis   Known history of gallstones, she did not have a cholecystectomy.  However this seems different than any gallstone  of symptoms she had in the past  Colon cancer screening-2017, I believe she is actually overdue for follow-up now due to polyp She got in touch with Dr Fuller Plan a couple of months ago - virtual visit.  They wanted to wait until she got through her covid series.  She does have a plan to get her screen accomplished Pap is up-to-date; she did have an abnormal and had LEEP about 19 years ago.  All normal screening since that time Last pap 2020- negative HPV Mammogram January 2018- this is scheduled at the end of next month   She is a former smoker, light drinker  Patient Active Problem List   Diagnosis Date Noted  . CML (chronic myelocytic leukemia) ([facility]) 06/03/2018  . Hemochromatosis 04/18/2018  . Hypermobility syndrome 09/26/2017  . Mast cell activation syndrome ([facility]) 09/26/2017  . POTS (postural orthostatic tachycardia syndrome) 08/10/2017  . Incomplete right bundle branch block 07/11/2017  . Fatigue 07/11/2017  . Low back pain 07/21/2016  . Right knee pain 06/05/2016  . Left shoulder pain 06/05/2016  . Hydrosalpinx 02/08/2016  . Hemorrhagic ovarian cyst 02/08/2016  . Right hip pain 02/03/2016  . HYDROSALPINX 02/07/2008  . HEADACHE 10/28/2007  . TACHYCARDIA 10/14/2007  . ANXIETY DISORDER 05/28/2007  . POLYARTHRITIS 05/09/2007  . UNSPECIFIED DISORDERS OF NERVOUS SYSTEM 04/22/2007  . ABDOMINAL PAIN RIGHT LOWER QUADRANT 04/08/2007  . PALPITATIONS, RECURRENT  11/07/2006  . PROBLEMS W/SMELL/TASTE 06/29/2006  . TOBACCO ABUSE 01/08/2006  . VISUAL IMPAIRMENT 01/08/2006  . DISTURBANCE, VISUAL NOS 01/08/2006  . FIBROCYSTIC BREAST DISEASE 01/08/2006  . [place] DISEASE, CERVICAL 01/08/2006  . VERTIGO 01/08/2006    Past Medical History:  Diagnosis Date  . Anxiety   . Arthritis   . Cervical intraepithelial neoplasia grade 3 2002   s/p LEEP Rx repeat pap negative  . Cervical radiculopathy   . CML (chronic myelocytic leukemia) ([facility]) 06/03/2018  . Fallopian tube disorder    right fallopian  tube removed  . Gallstones 09/2015   On CT  . Headache    Migraines  . Hemorrhagic ovarian cyst 02/08/2016  . Hydrosalpinx    [facility]  . Hydrosalpinx 02/08/2016  . Interstitial cystitis   . Neurological abnormality    Neg workup with MRI/MRA in 2007 WNL except decreased caliber MCA proximally, distal cavernous portion of left LCA which may represent true stenosis or technique on exam. Evaluated by Dr Linus Salmons.  . Palpitations   . Partial seizures ([facility])    questionable diag  . Pneumonia    walking pneumonia  . Polycystic ovary    multiple ovarian cysts removed  . S/P cervical discectomy    Dr Vertell Limber, anterior discectomy C5-C7  . S/P epidural steroid injection    Right hip  . Seizures ([facility]) 2005   simple partial seizure disorder    Past Surgical History:  Procedure Laterality Date  . APPENDECTOMY  1986  . BUNIONECTOMY Left    bunion removal  . CERVICAL DISC SURGERY  2005   C5-C7  . COLONOSCOPY    . DENTAL SURGERY    . fallopian tue removed    . LAPAROSCOPIC BILATERAL SALPINGECTOMY Left 02/09/2016   Procedure: LAPAROSCOPIC LEFT  SALPINGECTOMY;  Surgeon: Janyth Contes, MD;  Location: [facility];  Service: Gynecology;  Laterality: Left;  . LAPAROSCOPIC LYSIS OF ADHESIONS  02/09/2016   Procedure: LAPAROSCOPIC LYSIS OF ADHESIONS;  Surgeon: Janyth Contes, MD;  Location: [facility];  Service: Gynecology;;  momentum to adenexa  . LAPAROSCOPIC OVARIAN CYSTECTOMY Right 02/09/2016   Procedure: LAPAROSCOPIC OVARIAN CYSTECTOMY;  Surgeon: Janyth Contes, MD;  Location: [facility];  Service: Gynecology;  Laterality: Right;  x2 to right ovary  . LEEP  1998  . OOPHORECTOMY Left 02/09/2016   Procedure: LEFT OOPHORECTOMY;  Surgeon: Janyth Contes, MD;  Location: [facility];  Service: Gynecology;  Laterality: Left;  . TONSILLECTOMY  2001  . UPPER GI ENDOSCOPY      Social History   Tobacco Use  . Smoking status: Former Smoker    Packs/day: 0.00    Years: 0.00     Pack years: 0.00    Types: Cigarettes    Quit date: 11/02/2015    Years since quitting: 3.7  . Smokeless tobacco: Never Used  . Tobacco comment: currently smoking, began at beginning of June and plans to quit on June 14th. Has smoked off and on in the past.  Substance Use Topics  . Alcohol use: No    Alcohol/week: 1.0 - 2.0 standard drinks    Types: 1 - 2 Standard drinks or equivalent per week    Comment: occasionally 0-2 per day  . Drug use: No    Family History  Adopted: Yes  Problem Relation Age of Onset  . COPD Mother   . Breast cancer Mother   . Other Mother        Teeth problems- all removed by age 5  .  Rashes / Skin problems Sister   . Other Sister        Teeth problems- all removed by age 81  . Rashes / Skin problems Brother   . Asthma Brother   . Other Brother        Teeth problems- all removed by age 80    Allergies  Allergen Reactions  . Clindamycin/Lincomycin Itching and Other (See Comments)    Dizziness, trouble swallowing  . Codeine Itching and Nausea And Vomiting  . Cyclobenzaprine Other (See Comments)    Fast heartbeat, restless leg, nightmares  . Gabapentin Other (See Comments)    Headaches, visual disturbances.   . Nyquil Multi-Symptom [Pseudoeph-Doxylamine-Dm-Apap] Other (See Comments)    Heart racing, dizziness, weakness  . Uribel [Meth-Hyo-M Bl-Na Phos-Ph Sal] Other (See Comments)    Heart racing, dizziness, blurry vision.  . Xanax [Alprazolam]     Bruising  . Urelle Nausea And Vomiting    Medication list has been reviewed and updated.  Current Outpatient Medications on File Prior to Visit  Medication Sig Dispense Refill  . diazepam (VALIUM) 2 MG tablet Take 0.5-1 tablets (1-2 mg total) by mouth every 8 (eight) hours as needed for anxiety. 30 tablet 0  . famotidine (PEPCID) 20 MG tablet Take 20 mg by mouth 2 (two) times daily as needed (management of imatinib ADEs: nausea, diarrhea, muscle and joint pain).    Marland Kitchen imatinib (GLEEVEC) 100 MG  tablet TAKE 2 TABLETS (200 MG TOTAL) BY MOUTH DAILY. TAKE WITH MEALS AND LARGE GLASS OF WATER.CAUTION:CHEMOTHERAPY 60 tablet 1  . ketoconazole (NIZORAL) 2 % cream Apply topically 2 (two) times daily. to affected area    . loratadine (CLARITIN) 10 MG tablet Take 10 mg by mouth 2 (two) times daily as needed (management of imatinib ADEs: nausea, diarrhea, muscle and joint pain).    . tizanidine (ZANAFLEX) 2 MG capsule Take 1 capsule (2 mg total) by mouth 3 (three) times daily as needed for muscle spasms. 30 capsule 1   No current facility-administered medications on file prior to visit.    Review of Systems:  As per HPI- otherwise negative.   Physical Examination: Vitals:   07/23/19 1251  BP: 108/76  Pulse: 92  Resp: 16  Temp: (!) 97.1 F (36.2 C)  SpO2: 100%   Vitals:   07/23/19 1251  Weight: 183 lb (83 kg)  Height: 5\' 6"  (1.676 m)   Body mass index is 29.54 kg/m. Ideal Body Weight: Weight in (lb) to have BMI = 25: 154.6  GEN: no acute distress.  Mild overweight, looks well  HEENT: Atraumatic, Normocephalic.  Ears and Nose: No external deformity. CV: RRR, No M/G/R. No JVD. No thrill. No extra heart sounds. PULM: CTA B, no wheezes, crackles, rhonchi. No retractions. No resp. distress. No accessory muscle use. ABD: S, NT, ND, +BS. No rebound. No HSM. EXTR: No c/c/e PSYCH: Normally interactive. Conversant.  I am unable to appreciate a hot feeling on the right side of her abdomen.  Her abdomen is benign on exam, no tenderness or masses   Assessment and Plan: Physical exam  Chronic fatigue - Plan: Ambulatory referral to Endocrinology, amphetamine-dextroamphetamine (ADDERALL) 10 MG tablet  Hiatal hernia - Plan: DG Chest 2 View  Screening for hyperlipidemia - Plan: Lipid panel  Screening for diabetes mellitus - Plan: Hemoglobin A1c  Muscle pain - Plan: Lactate Dehydrogenase (LDH)  Here today for a physical exam and a few other concerns-patient with a somewhat complex  medical history Her CML  is being treated by hematology, is stable She describes a sensation of something slipping through her diaphragm into her chest, wonders if she might have a hiatal hernia.  We discussed doing imaging to further evaluate this phenomenon, decide to do a chest x-ray first in hopes of avoiding a CT scan Her abdomen at this time is benign, I do not believe she has any acute abdominal pathology.  I have asked her to keep me apprised of her progress as far as her abdominal symptoms For chronic fatigue, Jeani Hawking would like to try a stimulant.  I feel that a low-dose of Adderall would be reasonable in her case.  We will have her start on 5 mg once or twice a day, can increase to 10 mg as needed.  She is aware of possible side effects and potential for habit formation  Will plan further follow- up pending labs.  Moderate medical decision making today  This visit occurred during the SARS-CoV-2 public health emergency.  Safety protocols were in place, including screening questions prior to the visit, additional usage of staff PPE, and extensive cleaning of exam room while observing appropriate contact time as indicated for disinfecting solutions.    Signed Lamar Blinks, MD

## 2019-07-23 ENCOUNTER — Encounter: Payer: Self-pay | Admitting: Family Medicine

## 2019-07-23 ENCOUNTER — Ambulatory Visit (INDEPENDENT_AMBULATORY_CARE_PROVIDER_SITE_OTHER): Payer: BC Managed Care – PPO | Admitting: Family Medicine

## 2019-07-23 VITALS — BP 108/76 | HR 92 | Temp 97.1°F | Resp 16 | Ht 66.0 in | Wt 183.0 lb

## 2019-07-23 DIAGNOSIS — Z0000 Encounter for general adult medical examination without abnormal findings: Secondary | ICD-10-CM

## 2019-07-23 DIAGNOSIS — M791 Myalgia, unspecified site: Secondary | ICD-10-CM | POA: Diagnosis not present

## 2019-07-23 DIAGNOSIS — R5382 Chronic fatigue, unspecified: Secondary | ICD-10-CM | POA: Diagnosis not present

## 2019-07-23 DIAGNOSIS — Z13220 Encounter for screening for lipoid disorders: Secondary | ICD-10-CM

## 2019-07-23 DIAGNOSIS — K449 Diaphragmatic hernia without obstruction or gangrene: Secondary | ICD-10-CM | POA: Diagnosis not present

## 2019-07-23 DIAGNOSIS — Z131 Encounter for screening for diabetes mellitus: Secondary | ICD-10-CM

## 2019-07-23 MED ORDER — AMPHETAMINE-DEXTROAMPHETAMINE 10 MG PO TABS: 5.0000 mg | ORAL_TABLET | Freq: Two times a day (BID) | ORAL | 0 refills | Status: DC

## 2019-07-24 LAB — LACTATE DEHYDROGENASE: LDH: 195 U/L (ref 100–200)

## 2019-07-24 LAB — LIPID PANEL
Cholesterol: 194 mg/dL (ref 0–200)
HDL: 46.2 mg/dL (ref >39.00)
LDL Cholesterol: 112 mg/dL — ABNORMAL HIGH (ref 0–99)
NonHDL: 147.61
Total CHOL/HDL Ratio: 4
Triglycerides: 180 mg/dL — ABNORMAL HIGH (ref 0.0–149.0)
VLDL: 36 mg/dL (ref 0.0–40.0)

## 2019-07-24 LAB — HEMOGLOBIN A1C: Hgb A1c MFr Bld: 5 % (ref 4.6–6.5)

## 2019-07-28 ENCOUNTER — Encounter: Payer: Self-pay | Admitting: Gastroenterology

## 2019-07-28 ENCOUNTER — Other Ambulatory Visit: Payer: Self-pay

## 2019-07-28 ENCOUNTER — Encounter: Payer: Self-pay | Admitting: Family Medicine

## 2019-07-28 ENCOUNTER — Ambulatory Visit
Admission: RE | Admit: 2019-07-28 | Discharge: 2019-07-28 | Disposition: A | Discharge status: Home / Self Care | Payer: BC Managed Care – PPO | Source: Ambulatory Visit | Attending: Family Medicine | Admitting: Family Medicine

## 2019-07-28 DIAGNOSIS — K449 Diaphragmatic hernia without obstruction or gangrene: Secondary | ICD-10-CM

## 2019-07-28 DIAGNOSIS — R0602 Shortness of breath: Secondary | ICD-10-CM | POA: Diagnosis not present

## 2019-07-29 ENCOUNTER — Telehealth: Payer: Self-pay | Admitting: Family Medicine

## 2019-07-29 NOTE — Telephone Encounter (Signed)
Prior Authorization Needed for Medication :   amphetamine-dextroamphetamine (ADDERALL) 10 MG tablet  Per patient's spouse, patient states that insurance company will send a form to fill out for authorization.   Please advise

## 2019-07-30 ENCOUNTER — Encounter: Payer: Self-pay | Admitting: Family Medicine

## 2019-07-30 NOTE — Telephone Encounter (Signed)
Victoria Hall will you watch for PA fax?

## 2019-07-30 NOTE — Telephone Encounter (Signed)
Pt's insurance not covering Adderall 10mg - does she have ADD/ADHD/narcolepsy?

## 2019-08-04 NOTE — Progress Notes (Signed)
HEMATOLOGY/ONCOLOGY CLINIC NOTE  Date of Service: 08/05/2019  Patient Care Team: Copland, Gay Filler, MD as PCP - General (Family Medicine)  CHIEF COMPLAINTS/PURPOSE OF CONSULTATION:  Chronic Myeloid Leukemia  HISTORY OF PRESENTING ILLNESS:   Victoria Hall is a wonderful 49 y.o. female who has been referred to [place] by Dr. Silvestre Mesi for evaluation and management of CML. The pt was formerly under the care of my colleague Dr. Burney Gauze, and has transferred her care closer to home. The pt reports that she is doing well overall.  Prior to today's visit, the pt was recently diagnosed with CML after a 05/16/18 bone marrow biopsy revealed a translocation 9;22 and FISH study revealed BCR-ABL gene rearranagement in 85% of cells. The pt was prescribed 460m Imatinib with Dr. EMarin Olpon 06/03/18.  The pt reports that she had intermittent blurry vision for the last 4 years. She notes that she had night sweats for the last 20 years. She notes that her WBC have been elevated for the last 4 years. She notes that she has had elevated Basophils since April 2018. She has been evaluated for mast cell activation, and has had normal tryptase levels, including most recently on 06/03/18.  [facility]. The pt notes that she has "exercise intolerance," for the last 4 years, which she describes as needing to "lie on the couch for 2 weeks," after taking a run.  She has had intermittent rashes and hives for the last 20 years, which do resolve on their own. She keeps a food and activity journal for the last 2 years, and denies awareness of any associations with her rash onset. She has seen dermatology and immunology regarding this. The pt has also seen a rheumatologist for evaluation.  The pt notes that her hands and feet frequently fall asleep, and has been evaluated by neurology with an NCV. She notes that she has cervical  radiculopathy which she relates to her 2005 C5-7 fusion. She endorses exhaustion which "comes on out of no where." She notes her energy levels change "from minute to minute."   The pt denies abdominal pains or mouth sores.   The pt endorse frequent muscle and joint cramping as well. She has tried using CBD oil which she found to be helpful, but notes that the cost became prohibitive.  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  Most recent lab results (06/03/18) of CBC w/diff and CMP is as follows: all values are WNL except for WBC at 18.8k, ANC at 12.9k Basophils abs at 600, Abs immature granulocytes at 1.02k 06/24/18 LDH at 237  On review of systems, pt reports intermittent sudden-onset fatigue, intermittent headaches, intermittent hives and rashes, muscle and joint cramping, and denies abdominal pains, mouth sores, leg swelling, and any other symptoms.   On PMHx the pt reports chronic cervical radiculopathy with fusion of C5-7 in 2005. 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  Interval History:  I connected with  [name] 08/05/19 by telephone and verified that I am speaking with the correct person using two identifiers.   I discussed the limitations of evaluation and management by telemedicine. The patient expressed understanding and agreed to proceed.  Other persons participating in the visit  and their role in the encounter:        -Yevette Edwards, Medical Scribe       -Mr. Jenean Lindau, Pt's husband  Patient's location: Home Provider's location: Victoria Hall is a wonderful 49 y.o. female who is here for evaluation and management of CML. The patient's last visit with [place] was on 06/04/2019. The pt reports that she is doing well overall.  The pt reports  that she is still having debilitating fatigue and brain fog. She believes that Ibrutinib is contributing to her fatigue, but she notes that her fatigue was present before her diagnosis of CML. Dr. Lorelei Pont is considering placing pt on low-dose Adderall and referring her to an Endocrinologist. Since going to 200 mg of Ibrutinib she is no longer experiencing rashes and continues to take antihistamines daily. She has only had a single episode of major leg cramps that wake her in the night since the dose was lowered. Pt does have smaller hand cramps and other muscle cramps throughout the day. Pt has had a nerve conduction study and was found to have an acute radiculopathy. She still has right-sided weakness that is unexplained. Pt has a routine Colonoscopy scheduled in a couple of months. She has been intermittently been experiencing constipation and diarrhea for at least five years.   Lab results (07/22/19) of CBC w/diff and CMP is as follows: all values are WNL. 07/22/2019 Iron and TIBC is as follows: Iron at 99, TIBC at 319, Sat Ratios at 31, UIBC at 220 07/22/2019 BCR/ABL shows "e14a2 at 0.0261%. MMR: Yes" 07/22/2019 Ferritin at 61 07/23/2019 LDH at 195  On review of systems, pt reports fatigue, cognitive issues, constipation, diarrhea, abdominal pain, improving muscle cramps, right-sided weakness and denies rash and any other symptoms.   MEDICAL HISTORY:  Past Medical History:  Diagnosis Date  . Anxiety   . Arthritis   . Cervical intraepithelial neoplasia grade 3 2002   s/p LEEP Rx repeat pap negative  . Cervical radiculopathy   . CML (chronic myelocytic leukemia) ([place]) 06/03/2018  . Fallopian tube disorder    right fallopian tube removed  . Gallstones 09/2015   On CT  . Headache    Migraines  . Hemorrhagic ovarian cyst 02/08/2016  . Hydrosalpinx    [facility]  . Hydrosalpinx 02/08/2016  . Interstitial cystitis   . Neurological abnormality    Neg workup with MRI/MRA in  2007 WNL except decreased caliber MCA proximally, distal cavernous portion of left LCA which may represent true stenosis or technique on exam. Evaluated by Dr Linus Salmons.  . Palpitations   . Partial seizures ([facility])    questionable diag  . Pneumonia    walking pneumonia  . Polycystic ovary    multiple ovarian cysts removed  . S/P cervical discectomy    Dr Vertell Limber, anterior discectomy C5-C7  . S/P epidural steroid injection    Right hip  . Seizures ([place]) 2005   simple partial seizure disorder    SURGICAL HISTORY: Past Surgical History:  Procedure Laterality Date  . APPENDECTOMY  1986  . BUNIONECTOMY Left    bunion removal  . CERVICAL DISC SURGERY  2005   C5-C7  . COLONOSCOPY    . DENTAL SURGERY    . fallopian tue removed    . LAPAROSCOPIC BILATERAL SALPINGECTOMY Left 02/09/2016   Procedure: LAPAROSCOPIC LEFT  SALPINGECTOMY;  Surgeon: Janyth Contes, MD;  Location: [facility];  Service: Gynecology;  Laterality: Left;  . LAPAROSCOPIC  LYSIS OF ADHESIONS  02/09/2016   Procedure: LAPAROSCOPIC LYSIS OF ADHESIONS;  Surgeon: Janyth Contes, MD;  Location: [facility];  Service: Gynecology;;  momentum to adenexa  . LAPAROSCOPIC OVARIAN CYSTECTOMY Right 02/09/2016   Procedure: LAPAROSCOPIC OVARIAN CYSTECTOMY;  Surgeon: Janyth Contes, MD;  Location: [facility];  Service: Gynecology;  Laterality: Right;  x2 to right ovary  . LEEP  1998  . OOPHORECTOMY Left 02/09/2016   Procedure: LEFT OOPHORECTOMY;  Surgeon: Janyth Contes, MD;  Location: [facility];  Service: Gynecology;  Laterality: Left;  . TONSILLECTOMY  2001  . UPPER GI ENDOSCOPY      SOCIAL HISTORY: Social History   Socioeconomic History  . Marital status: Married    Spouse name: Not on file  . Number of children: 0  . Years of education: Not on file  . Highest education level: Not on file  Occupational History  . 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  . Occupation:  Pharmacist, hospital  Tobacco Use  . Smoking status: Former Smoker    Packs/day: 0.00    Years: 0.00    Pack years: 0.00    Types: Cigarettes    Quit date: 11/02/2015    Years since quitting: 3.7  . Smokeless tobacco: Never Used  . Tobacco comment: currently smoking, began at beginning of June and plans to quit on June 14th. Has smoked off and on in the past.  Substance and Sexual Activity  . Alcohol use: No    Alcohol/week: 1.0 - 2.0 standard drinks    Types: 1 - 2 Standard drinks or equivalent per week    Comment: occasionally 0-2 per day  . Drug use: No  . Sexual activity: Yes  Other Topics Concern  . Not on file  Social History Narrative  . Not on file   Social Determinants of Health   Financial Resource Strain:   . Difficulty of Paying Living Expenses:   Food Insecurity:   . Worried About Charity fundraiser in the Last Year:   . Arboriculturist in the Last Year:   Transportation Needs:   . Film/video editor (Medical):   Marland Kitchen Lack of Transportation (Non-Medical):   Physical Activity:   . Days of Exercise per Week:   . Minutes of Exercise per Session:   Stress:   . Feeling of Stress :   Social Connections:   . Frequency of Communication with Friends and Family:   . Frequency of Social Gatherings with Friends and Family:   . Attends Religious Services:   . Active Member of Clubs or Organizations:   . Attends Archivist Meetings:   Marland Kitchen Marital Status:   Intimate Partner Violence:   . Fear of Current or Ex-Partner:   . Emotionally Abused:   Marland Kitchen Physically Abused:   . Sexually Abused:     FAMILY HISTORY: Family History  Adopted: Yes  Problem Relation Age of Onset  . COPD Mother   . Breast cancer Mother   . Other Mother        Teeth problems- all removed by age 107  . Rashes / Skin problems Sister   . Other Sister        Teeth problems- all removed by age 50  . Rashes / Skin problems Brother   . Asthma Brother   . Other Brother        Teeth problems- all  removed by age 74  ALLERGIES:  is allergic to clindamycin/lincomycin; codeine; cyclobenzaprine; gabapentin; nyquil multi-symptom [pseudoeph-doxylamine-dm-apap]; uribel [meth-hyo-m bl-na phos-ph sal]; xanax [alprazolam]; and urelle.  MEDICATIONS:  Current Outpatient Medications  Medication Sig Dispense Refill  . amphetamine-dextroamphetamine (ADDERALL) 10 MG tablet Take 0.5-1 tablets (5-10 mg total) by mouth 2 (two) times daily. Use as needed for fatigue 60 tablet 0  . diazepam (VALIUM) 2 MG tablet Take 0.5-1 tablets (1-2 mg total) by mouth every 8 (eight) hours as needed for anxiety. 30 tablet 0  . famotidine (PEPCID) 20 MG tablet Take 20 mg by mouth 2 (two) times daily as needed (management of imatinib ADEs: nausea, diarrhea, muscle and joint pain).    Marland Kitchen imatinib (GLEEVEC) 100 MG tablet TAKE 2 TABLETS (200 MG TOTAL) BY MOUTH DAILY. TAKE WITH MEALS AND LARGE GLASS OF WATER.CAUTION:CHEMOTHERAPY 60 tablet 1  . ketoconazole (NIZORAL) 2 % cream Apply topically 2 (two) times daily. to affected area    . loratadine (CLARITIN) 10 MG tablet Take 10 mg by mouth 2 (two) times daily as needed (management of imatinib ADEs: nausea, diarrhea, muscle and joint pain).    . tizanidine (ZANAFLEX) 2 MG capsule Take 1 capsule (2 mg total) by mouth 3 (three) times daily as needed for muscle spasms. 30 capsule 1   No current facility-administered medications for this visit.    REVIEW OF SYSTEMS:   A 10+ POINT REVIEW OF SYSTEMS WAS OBTAINED including neurology, dermatology, psychiatry, cardiac, respiratory, lymph, extremities, GI, GU, Musculoskeletal, constitutional, breasts, reproductive, HEENT.  All pertinent positives are noted in the HPI.  All others are negative.   PHYSICAL EXAMINATION: There were no vitals filed for this visit. Wt Readings from Last 3 Encounters:  07/23/19 183 lb (83 kg)  02/03/19 180 lb (81.6 kg)  08/28/18 173 lb (78.5 kg)   There is no height or weight on file to calculate BMI.     Telehealth visit  08/05/19   LABORATORY DATA:  I have reviewed the data as listed  . CBC Latest Ref Rng & Units 07/22/2019 05/21/2019 03/26/2019  WBC 4.0 - 10.5 K/uL 7.1 7.1 7.4  Hemoglobin 12.0 - 15.0 g/dL 13.7 13.4 12.9  Hematocrit 36.0 - 46.0 % 39.7 38.3 37.2  Platelets 150 - 400 K/uL 282 276 268   . CBC    Component Value Date/Time   WBC 7.1 07/22/2019 1106   RBC 4.49 07/22/2019 1106   HGB 13.7 07/22/2019 1106   HGB 12.5 01/16/2019 1402   HGB 15.1 03/26/2017 1101   HCT 39.7 07/22/2019 1106   HCT 44.0 03/26/2017 1101   PLT 282 07/22/2019 1106   PLT 259 01/16/2019 1402   PLT 330 03/26/2017 1101   MCV 88.4 07/22/2019 1106   MCV 84 03/26/2017 1101   MCH 30.5 07/22/2019 1106   MCHC 34.5 07/22/2019 1106   RDW 12.8 07/22/2019 1106   RDW 14.0 03/26/2017 1101   LYMPHSABS 2.3 07/22/2019 1106   LYMPHSABS 2.8 03/26/2017 1101   MONOABS 0.5 07/22/2019 1106   EOSABS 0.4 07/22/2019 1106   EOSABS 0.4 03/26/2017 1101   BASOSABS 0.1 07/22/2019 1106   BASOSABS 0.2 03/26/2017 1101     . CMP Latest Ref Rng & Units 07/22/2019 05/21/2019 03/26/2019  Glucose 70 - 99 mg/dL 97 90 90  BUN 6 - 20 mg/dL _0 Creatinine 0.44 - 1.00 mg/dL 0.96 0.86 0.92  Sodium 135 - 145 mmol/L 138 140 139  Potassium 3.5 - 5.1 mmol/L 4.4 4.1 4.1  Chloride 98 - 111 mmol/L  105 106 104  CO2 22 - 32 mmol/L _0 Calcium 8.9 - 10.3 mg/dL 9.4 9.0 8.8(L)  Total Protein 6.5 - 8.1 g/dL 7.1 7.0 6.9  Total Bilirubin 0.3 - 1.2 mg/dL 0.5 0.5 0.4  Alkaline Phos 38 - 126 U/L 69 69 71  AST 15 - 41 U/L _1 ALT 0 - 44 U/L _2 05/21/2019 BCR/ABL:    03/26/2019 BCR/ABL:    01/28/2019 FISH:   07/25/18 Initial BCR-ABL:    06/03/18 FISH:   05/16/18 Cytogenetics:   04/10/18 Hemochromatosis Panel:    RADIOGRAPHIC STUDIES: I have personally reviewed the radiological images as listed and agreed with the findings in the report. DG Chest 2 View  Result Date: 07/28/2019 CLINICAL DATA:  Mid  sternal pain in shortness of breath. EXAM: CHEST - 2 VIEW COMPARISON:  08/28/2018 FINDINGS: Lower cervical plate and screw fixator. Minimal scarring at the left lung base. Lungs appear otherwise clear. Cardiac and mediastinal margins appear normal. No blunting of the costophrenic angles. IMPRESSION: No active cardiopulmonary disease is radiographically apparent. Electronically Signed   By: Van Clines M.D.   On: 07/28/2019 11:13    01/31/18 ECHO Transthoracic:    ASSESSMENT & PLAN:   49 y.o. female with  1. CML- Chronic phase 05/16/18 BM Cytogenetics revealed translocation 9;22. FISH report revealed BCR-ABL gene rearrangement present in 86.5% of cells 06/03/18 FISH revealed BCR-ABL gene rearrangement present in 85.67% of cells, with some hypercellularity around 80% 06/05/18 US Abdomen revealed normal spleen size 01/31/18 ECHO revealed LV EF of 63%, no regional wall motion abnormalities, no significant valvular hear disease  Initial pre-treatment 07/25/18 BCR-ABL revealed IS at 71.1362%  08/28/18 EKG reviewed  2. 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  -Will get Copper and Carnitine levels with next lab to r/o nutritional deficiencies. -The pt has no prohibitive toxicities from continuing 200 mg Imatinib at this time.  -Pt still comfortable continuing Ibrutinib at 200 mg given current symptomology.  -If BCR/ABL is stable at next visit would consider lowering dose of Ibrutinib to 100 mg based on pt's wishes.  -Will see back in 12 weeks, with labs two weeks prior    FOLLOW UP: Labs in 10 weeks Phone with Dr Irene Limbo in 12  weeks   The total time spent in the appt was 20 minutes and more than 50% was on counseling and direct patient cares.  All of the patient's questions were answered with apparent satisfaction. The patient knows to call the clinic with any problems, questions or concerns.  Sullivan Lone MD [place] [facility]AHIVMS [facility] Hematology/Oncology Physician [facility]  (Office):       [phone] (Work cell):  [phone] (Fax):           [phone]  08/05/2019 4:18 PM  I, Yevette Edwards, am acting as a scribe for Dr. Sullivan Lone.   .I have reviewed the above documentation for accuracy and completeness, and I agree with the above. Brunetta Genera MD

## 2019-08-05 ENCOUNTER — Inpatient Hospital Stay: Payer: BC Managed Care – PPO | Attending: Family | Admitting: Hematology

## 2019-08-05 DIAGNOSIS — C9210 Chronic myeloid leukemia, BCR/ABL-positive, not having achieved remission: Secondary | ICD-10-CM | POA: Diagnosis not present

## 2019-08-06 ENCOUNTER — Other Ambulatory Visit: Payer: Self-pay

## 2019-08-07 ENCOUNTER — Telehealth: Payer: Self-pay | Admitting: Hematology

## 2019-08-07 NOTE — Telephone Encounter (Signed)
Scheduled per 05/04 los, patient has been called and notified.

## 2019-08-08 ENCOUNTER — Ambulatory Visit: Payer: BC Managed Care – PPO | Admitting: Endocrinology

## 2019-08-08 ENCOUNTER — Encounter: Payer: Self-pay | Admitting: Endocrinology

## 2019-08-08 ENCOUNTER — Other Ambulatory Visit: Payer: Self-pay

## 2019-08-08 VITALS — BP 100/70 | HR 109 | Ht 66.0 in | Wt 187.0 lb

## 2019-08-08 DIAGNOSIS — R000 Tachycardia, unspecified: Secondary | ICD-10-CM

## 2019-08-08 DIAGNOSIS — R232 Flushing: Secondary | ICD-10-CM

## 2019-08-08 DIAGNOSIS — R5383 Other fatigue: Secondary | ICD-10-CM | POA: Diagnosis not present

## 2019-08-08 LAB — CORTISOL
Cortisol, Plasma: 6.7 ug/dL
Cortisol, Plasma: 25.2 ug/dL

## 2019-08-08 MED ORDER — COSYNTROPIN 0.25 MG IJ SOLR
0.2500 mg | Freq: Once | INTRAMUSCULAR | Status: AC
  Administered 2019-08-08: 0.25 mg via INTRAMUSCULAR

## 2019-08-08 NOTE — Progress Notes (Signed)
Subjective:    Patient ID: Victoria Hall, female    DOB: April 11, 1970, 49 y.o.   MRN: [id number]  HPI Pt is ref by Dr Denny Peon, to consider poss endocrine causes of severe fatigue.  She also has muscle cramps, doe, weight gain, alternating constipation and diarrhea, and flushing.  She has not recently taken adderall.    Past Medical History:  Diagnosis Date  . Anxiety   . Arthritis   . Cervical intraepithelial neoplasia grade 3 2002   s/p LEEP Rx repeat pap negative  . Cervical radiculopathy   . CML (chronic myelocytic leukemia) ([place]) 06/03/2018  . Fallopian tube disorder    right fallopian tube removed  . Gallstones 09/2015   On CT  . Headache    Migraines  . Hemorrhagic ovarian cyst 02/08/2016  . Hydrosalpinx    [facility]  . Hydrosalpinx 02/08/2016  . Interstitial cystitis   . Neurological abnormality    Neg workup with MRI/MRA in 2007 WNL except decreased caliber MCA proximally, distal cavernous portion of left LCA which may represent true stenosis or technique on exam. Evaluated by Dr Linus Salmons.  . Palpitations   . Partial seizures (H[facility])    questionable diag  . Pneumonia    walking pneumonia  . Polycystic ovary    multiple ovarian cysts removed  . S/P cervical discectomy    Dr Vertell Limber, anterior discectomy C5-C7  . S/P epidural steroid injection    Right hip  . Seizures ([place]) 2005   simple partial seizure disorder    Past Surgical History:  Procedure Laterality Date  . APPENDECTOMY  1986  . BUNIONECTOMY Left    bunion removal  . CERVICAL DISC SURGERY  2005   C5-C7  . COLONOSCOPY    . DENTAL SURGERY    . fallopian tue removed    . LAPAROSCOPIC BILATERAL SALPINGECTOMY Left 02/09/2016   Procedure: LAPAROSCOPIC LEFT  SALPINGECTOMY;  Surgeon: Janyth Contes, MD;  Location: [facility];  Service: Gynecology;  Laterality: Left;  . LAPAROSCOPIC LYSIS OF ADHESIONS  02/09/2016   Procedure: LAPAROSCOPIC LYSIS OF ADHESIONS;  Surgeon: Janyth Contes,  MD;  Location: [facility];  Service: Gynecology;;  momentum to adenexa  . LAPAROSCOPIC OVARIAN CYSTECTOMY Right 02/09/2016   Procedure: LAPAROSCOPIC OVARIAN CYSTECTOMY;  Surgeon: Janyth Contes, MD;  Location: [facility];  Service: Gynecology;  Laterality: Right;  x2 to right ovary  . LEEP  1998  . OOPHORECTOMY Left 02/09/2016   Procedure: LEFT OOPHORECTOMY;  Surgeon: Janyth Contes, MD;  Location: [facility];  Service: Gynecology;  Laterality: Left;  . TONSILLECTOMY  2001  . UPPER GI ENDOSCOPY      Social History   Socioeconomic History  . Marital status: Married    Spouse name: Not on file  . Number of children: 0  . Years of education: Not on file  . Highest education level: Not on file  Occupational History  . 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  . Occupation: Pharmacist, hospital  Tobacco Use  . Smoking status: Former Smoker    Packs/day: 0.00    Years: 0.00    Pack years: 0.00    Types: Cigarettes    Quit date: 11/02/2015    Years since quitting: 3.7  . Smokeless tobacco: Never Used  . Tobacco comment: currently smoking, began at beginning of June and plans to quit on June 14th. Has smoked off and on in the past.  Substance and Sexual  Activity  . Alcohol use: No    Alcohol/week: 1.0 - 2.0 standard drinks    Types: 1 - 2 Standard drinks or equivalent per week    Comment: occasionally 0-2 per day  . Drug use: No  . Sexual activity: Yes  Other Topics Concern  . Not on file  Social History Narrative  . Not on file   Social Determinants of Health   Financial Resource Strain:   . Difficulty of Paying Living Expenses:   Food Insecurity:   . Worried About Charity fundraiser in the Last Year:   . Arboriculturist in the Last Year:   Transportation Needs:   . Film/video editor (Medical):   Marland Kitchen Lack of Transportation (Non-Medical):   Physical Activity:   . Days of Exercise per Week:   . Minutes of Exercise per Session:    Stress:   . Feeling of Stress :   Social Connections:   . Frequency of Communication with Friends and Family:   . Frequency of Social Gatherings with Friends and Family:   . Attends Religious Services:   . Active Member of Clubs or Organizations:   . Attends Archivist Meetings:   Marland Kitchen Marital Status:   Intimate Partner Violence:   . Fear of Current or Ex-Partner:   . Emotionally Abused:   Marland Kitchen Physically Abused:   . Sexually Abused:     Current Outpatient Medications on File Prior to Visit  Medication Sig Dispense Refill  . Ascorbic Acid (VITAMIN C) 100 MG tablet Take 100 mg by mouth daily.    . cholecalciferol (VITAMIN D3) 25 MCG (1000 UNIT) tablet Take 1,000 Units by mouth once a week.    . famotidine (PEPCID) 20 MG tablet Take 20 mg by mouth daily.     Marland Kitchen imatinib (GLEEVEC) 100 MG tablet TAKE 2 TABLETS (200 MG TOTAL) BY MOUTH DAILY. TAKE WITH MEALS AND LARGE GLASS OF WATER.CAUTION:CHEMOTHERAPY 60 tablet 1  . loratadine (CLARITIN) 10 MG tablet Take 10 mg by mouth daily.     Marland Kitchen amphetamine-dextroamphetamine (ADDERALL) 10 MG tablet Take 0.5-1 tablets (5-10 mg total) by mouth 2 (two) times daily. Use as needed for fatigue (Patient not taking: Reported on 08/08/2019) 60 tablet 0  . diazepam (VALIUM) 2 MG tablet Take 0.5-1 tablets (1-2 mg total) by mouth every 8 (eight) hours as needed for anxiety. (Patient not taking: Reported on 08/08/2019) 30 tablet 0  . tizanidine (ZANAFLEX) 2 MG capsule Take 1 capsule (2 mg total) by mouth 3 (three) times daily as needed for muscle spasms. (Patient not taking: Reported on 08/08/2019) 30 capsule 1   No current facility-administered medications on file prior to visit.    Allergies  Allergen Reactions  . Clindamycin/Lincomycin Itching and Other (See Comments)    Dizziness, trouble swallowing  . Codeine Itching and Nausea And Vomiting  . Cyclobenzaprine Other (See Comments)    Fast heartbeat, restless leg, nightmares  . Gabapentin Other (See  Comments)    Headaches, visual disturbances.   . Nyquil Multi-Symptom [Pseudoeph-Doxylamine-Dm-Apap] Other (See Comments)    Heart racing, dizziness, weakness  . Uribel [Meth-Hyo-M Bl-Na Phos-Ph Sal] Other (See Comments)    Heart racing, dizziness, blurry vision.  . Xanax [Alprazolam]     Bruising  . Urelle Nausea And Vomiting    Family History  Adopted: Yes  Problem Relation Age of Onset  . COPD Mother   . Breast cancer Mother   . Other Mother  Teeth problems- all removed by age 59  . Rashes / Skin problems Sister   . Other Sister        Teeth problems- all removed by age 69  . Rashes / Skin problems Brother   . Asthma Brother   . Other Brother        Teeth problems- all removed by age 72  . Adrenal disorder Neg Hx     BP 100/70   Pulse (!) 109   Ht 5\' 6"  (1.676 m)   Wt 187 lb (84.8 kg)   SpO2 97%   BMI 30.18 kg/m    Review of Systems Denies fever, weight loss, dizziness, n/v, depression, anxiety, vitiligo, excessive sweating.  She has cold intolerance.       Objective:   Physical Exam VS: see vs page GEN: no distress HEAD: head: no deformity eyes: no periorbital swelling, no proptosis external nose and ears are normal NECK: a healed scar is present (c-spine procedure).  I do not appreciate a nodule in the thyroid or elsewhere in the neck.   CHEST WALL: no deformity LUNGS: clear to auscultation CV: reg rate and rhythm, no murmur.  MUSCULOSKELETAL: muscle bulk and strength are grossly normal.  no obvious joint swelling.  gait is normal and steady EXTEMITIES: no deformity.  no edema PULSES: no carotid bruit NEURO:  cn 2-12 grossly intact.   readily moves all 4's.  sensation is intact to touch on all 4's SKIN:  Normal texture and temperature.  No rash or suspicious lesion is visible.   NODES:  None palpable at the neck PSYCH: alert, well-oriented.  Does not appear anxious nor depressed.  Lab Results  Component Value Date   TSH 1.598 05/21/2019     ACTH stimulation test is done: baseline cortisol level=7 then Cosyntropin 250 mcg is given im 45 minutes later, cortisol level=25 (normal response)  Lab Results  Component Value Date   HGBA1C 5.0 07/23/2019   I have reviewed outside records, and summarized: Pt was noted to have above sxs, and referred here. Wellness and dyslipidemia were also addressed      Assessment & Plan:  Fatigue, new to me, uncertain etiology.  Glucocorticoid insuff is excluded as a cause.   Patient Instructions  Blood tests are requested for you today.  We'll let you know about the results.  I would be happy to see you back here as needed.

## 2019-08-08 NOTE — Patient Instructions (Addendum)
Blood tests are requested for you today.  We'll let you know about the results.  I would be happy to see you back here as needed.

## 2019-08-12 DIAGNOSIS — F0634 Mood disorder due to known physiological condition with mixed features: Secondary | ICD-10-CM | POA: Diagnosis not present

## 2019-08-19 LAB — METANEPHRINES, PLASMA
Metanephrine, Free: <25 pg/mL (ref <=57)
Normetanephrine, Free: 63 pg/mL (ref <=148)
Total Metanephrines-Plasma: 63 pg/mL (ref <=205)

## 2019-08-19 LAB — CATECHOLAMINES, FRACTIONATED, PLASMA
Dopamine: <10 pg/mL
Epinephrine: <20 pg/mL
Norepinephrine: 414 pg/mL
Total Catecholamines: 414 pg/mL

## 2019-08-19 LAB — CALCITONIN: Calcitonin: <2 pg/mL (ref <=5)

## 2019-08-27 ENCOUNTER — Encounter: Payer: Self-pay | Admitting: Family Medicine

## 2019-08-27 ENCOUNTER — Other Ambulatory Visit: Payer: Self-pay | Admitting: Family Medicine

## 2019-08-27 ENCOUNTER — Telehealth: Payer: Self-pay | Admitting: Family Medicine

## 2019-08-27 ENCOUNTER — Ambulatory Visit
Admission: RE | Admit: 2019-08-27 | Discharge: 2019-08-27 | Disposition: A | Discharge status: Home / Self Care | Payer: BC Managed Care – PPO | Source: Ambulatory Visit | Attending: Family Medicine | Admitting: Family Medicine

## 2019-08-27 ENCOUNTER — Other Ambulatory Visit: Payer: Self-pay

## 2019-08-27 DIAGNOSIS — Z1239 Encounter for other screening for malignant neoplasm of breast: Secondary | ICD-10-CM

## 2019-08-27 DIAGNOSIS — N63 Unspecified lump in unspecified breast: Secondary | ICD-10-CM

## 2019-08-27 NOTE — Telephone Encounter (Signed)
CallerRyder Hall Call Back # [phone]  Patient states that she went to [facility] for a mammogram. Patient was told that she needed an ultrasound and diagnostics testing prior to appointment for mammogram.  Please Advise

## 2019-08-27 NOTE — Telephone Encounter (Signed)
Call the breast center, they have sent me orders to sign for diagnostic mammo and ultrasound We will take care of this tomorrow

## 2019-09-02 DIAGNOSIS — F0634 Mood disorder due to known physiological condition with mixed features: Secondary | ICD-10-CM | POA: Diagnosis not present

## 2019-09-02 HISTORY — PX: BREAST BIOPSY: SHX20

## 2019-09-03 ENCOUNTER — Other Ambulatory Visit: Payer: Self-pay

## 2019-09-03 ENCOUNTER — Ambulatory Visit
Admission: RE | Admit: 2019-09-03 | Discharge: 2019-09-03 | Disposition: A | Discharge status: Home / Self Care | Payer: BC Managed Care – PPO | Source: Ambulatory Visit | Attending: Family Medicine | Admitting: Family Medicine

## 2019-09-03 ENCOUNTER — Encounter: Payer: Self-pay | Admitting: Family Medicine

## 2019-09-03 ENCOUNTER — Other Ambulatory Visit: Payer: Self-pay | Admitting: Family Medicine

## 2019-09-03 DIAGNOSIS — N644 Mastodynia: Secondary | ICD-10-CM | POA: Diagnosis not present

## 2019-09-03 DIAGNOSIS — R922 Inconclusive mammogram: Secondary | ICD-10-CM | POA: Diagnosis not present

## 2019-09-03 DIAGNOSIS — N63 Unspecified lump in unspecified breast: Secondary | ICD-10-CM

## 2019-09-03 DIAGNOSIS — R921 Mammographic calcification found on diagnostic imaging of breast: Secondary | ICD-10-CM

## 2019-09-08 ENCOUNTER — Other Ambulatory Visit: Payer: BC Managed Care – PPO

## 2019-09-08 ENCOUNTER — Telehealth: Payer: Self-pay

## 2019-09-08 NOTE — Telephone Encounter (Signed)
Received call from pt, she would like to know if it is okay with Dr. Irene Limbo for her to hold on taking her Imatinib for a couple days before her Breast Tissue Biopsy on 06/10 because she would be getting Lidocaine w/epinephrine anesthesia which is flagged as contraindicated with Imatinib and can cause lidocaine toxicity.  Per Dr. Irene Limbo: toxicity is primarily a concern with IV Lidocaine use for heart arrhythmias, but even as such, she is okay to hold it for a couple of days. thx  TCT patient, unable to reach her, LVM.

## 2019-09-11 ENCOUNTER — Ambulatory Visit
Admission: RE | Admit: 2019-09-11 | Discharge: 2019-09-11 | Disposition: A | Discharge status: Home / Self Care | Payer: BC Managed Care – PPO | Source: Ambulatory Visit | Attending: Family Medicine | Admitting: Family Medicine

## 2019-09-11 ENCOUNTER — Other Ambulatory Visit: Payer: Self-pay

## 2019-09-11 DIAGNOSIS — R921 Mammographic calcification found on diagnostic imaging of breast: Secondary | ICD-10-CM

## 2019-09-11 DIAGNOSIS — N6011 Diffuse cystic mastopathy of right breast: Secondary | ICD-10-CM | POA: Diagnosis not present

## 2019-09-15 ENCOUNTER — Telehealth: Payer: Self-pay | Admitting: *Deleted

## 2019-09-15 ENCOUNTER — Ambulatory Visit (AMBULATORY_SURGERY_CENTER): Payer: Self-pay | Admitting: *Deleted

## 2019-09-15 ENCOUNTER — Other Ambulatory Visit: Payer: Self-pay

## 2019-09-15 VITALS — Ht 66.0 in | Wt 186.0 lb

## 2019-09-15 DIAGNOSIS — Z86010 Personal history of colonic polyps: Secondary | ICD-10-CM

## 2019-09-15 LAB — BCR/ABL

## 2019-09-15 MED ORDER — SUPREP BOWEL PREP KIT 17.5-3.13-1.6 GM/177ML PO SOLN: 1.0000 | Freq: Once | ORAL | 0 refills | Status: AC

## 2019-09-15 NOTE — Progress Notes (Signed)
No egg or soy allergy known to patient  No issues with past sedation with any surgeries  or procedures, no intubation problems  No diet pills per patient No home 02 use per patient  No blood thinners per patient  Pt denies issues with constipation  No A fib or A flutter  EMMI video sent to pt's e mail   Suprep Coupon to pt , code to pharmacy   Due to the COVID-19 pandemic we are asking patients to follow these guidelines. Please only bring one care partner. Please be aware that your care partner may wait in the car in the parking lot or if they feel like they will be too hot to wait in the car, they may wait in the lobby on the 4th floor. All care partners are required to wear a mask the entire time (we do not have any that we can provide them), they need to practice social distancing, and we will do a Covid check for all patient's and care partners when you arrive. Also we will check their temperature and your temperature. If the care partner waits in their car they need to stay in the parking lot the entire time and we will call them on their cell phone when the patient is ready for discharge so they can bring the car to the front of the building. Also all patient's will need to wear a mask into building.  Completed covid vaccines 07-09-19

## 2019-09-15 NOTE — Telephone Encounter (Signed)
Saw pt in [facility] today- she has a hx of CML- she takes Imatinib and she would like you toi call her as she has many questions about Propofol and this medication   Thanks , [place]

## 2019-09-17 NOTE — Telephone Encounter (Signed)
Victoria Hall,  I spoke to this pt yesterday and allayed her fears and answered all her questions.  She seemed satisfied at the end of our conversation.  Thanks,  Osvaldo Angst

## 2019-09-22 ENCOUNTER — Encounter: Payer: Self-pay | Admitting: Gastroenterology

## 2019-09-26 ENCOUNTER — Telehealth: Payer: Self-pay | Admitting: Gastroenterology

## 2019-09-26 MED ORDER — PLENVU 140 G PO SOLR: 1.0000 | Freq: Once | ORAL | Status: DC

## 2019-09-26 NOTE — Telephone Encounter (Signed)
Patient called states the prep medication requires prior auth please advise on alternative also sounds like she is looking to cancel sx

## 2019-09-26 NOTE — Telephone Encounter (Signed)
Spoke with pharmacy and they ran the [place] coupon, which made the cost of the prep $97.  Pt states she cannot pay that amount.  Sutab RX offered, but pt states she cannot take pills without water.  I offered her a Plenvu sample, with new instructions.  She is concerned about the interaction of the her Gleevec and Plenvu.   I called [facility] and spoke with Maudie Mercury to check interactions- I also put the RX into the computer to see if there is a contraindication and there is not.  Kim checked as well, and no contraindication.  I spoke with pt again and she is going to have her husband pick up RX today with new set of instructions.  I reviewed Plenvu instructions with her and she will call back if she has issues at all

## 2019-09-29 ENCOUNTER — Ambulatory Visit (AMBULATORY_SURGERY_CENTER): Payer: BC Managed Care – PPO | Admitting: Gastroenterology

## 2019-09-29 ENCOUNTER — Encounter: Payer: Self-pay | Admitting: Gastroenterology

## 2019-09-29 ENCOUNTER — Other Ambulatory Visit: Payer: Self-pay

## 2019-09-29 VITALS — BP 113/71 | HR 80 | Temp 97.3°F | Resp 17 | Ht 66.0 in | Wt 186.0 lb

## 2019-09-29 DIAGNOSIS — Z86010 Personal history of colonic polyps: Secondary | ICD-10-CM

## 2019-09-29 DIAGNOSIS — D125 Benign neoplasm of sigmoid colon: Secondary | ICD-10-CM

## 2019-09-29 DIAGNOSIS — Z1211 Encounter for screening for malignant neoplasm of colon: Secondary | ICD-10-CM | POA: Diagnosis not present

## 2019-09-29 MED ORDER — SODIUM CHLORIDE 0.9 % IV SOLN
500.0000 mL | Freq: Once | INTRAVENOUS | Status: DC
Start: 2019-09-29 — End: 2019-09-29

## 2019-09-29 NOTE — Op Note (Signed)
[facility] Patient Name: Victoria Hall Procedure Date: 09/29/2019 7:58 AM MRN: [id number] Endoscopist: Ladene Artist , MD Age: 49 Referring MD:  Date of Birth: 1971/01/14 Gender: Female Account #: [id number] Procedure:                Colonoscopy Indications:              High risk colon cancer surveillance: Personal                            history of sessile serrated colon polyp (10 mm or                            greater in size) Medicines:                Monitored Anesthesia Care Procedure:                Pre-Anesthesia Assessment:                           - Prior to the procedure, a History and Physical                            was performed, and patient medications and                            allergies were reviewed. The patient's tolerance of                            previous anesthesia was also reviewed. The risks                            and benefits of the procedure and the sedation                            options and risks were discussed with the patient.                            All questions were answered, and informed consent                            was obtained. Prior Anticoagulants: The patient has                            taken no previous anticoagulant or antiplatelet                            agents. ASA Grade Assessment: II - A patient with                            mild systemic disease. After reviewing the risks                            and benefits, the patient was deemed in  satisfactory condition to undergo the procedure.                           After obtaining informed consent, the colonoscope                            was passed under direct vision. Throughout the                            procedure, the patient's blood pressure, pulse, and                            oxygen saturations were monitored continuously. The                            Colonoscope was introduced through the anus  and                            advanced to the the cecum, identified by                            appendiceal orifice and ileocecal valve. The                            ileocecal valve, appendiceal orifice, and rectum                            were photographed. The quality of the bowel                            preparation was excellent. The colonoscopy was                            performed without difficulty. The patient tolerated                            the procedure well. Scope In: 8:05:00 AM Scope Out: 8:17:05 AM Scope Withdrawal Time: 0 hours 10 minutes 3 seconds  Total Procedure Duration: 0 hours 12 minutes 5 seconds  Findings:                 The perianal and digital rectal examinations were                            normal.                           A 6 mm polyp was found in the sigmoid colon. The                            polyp was sessile. The polyp was removed with a                            cold snare. Resection and retrieval were complete.  A 15 mm post polypectomy scar was found in the                            cecum. There was no evidence of the previous polyp.                           The exam was otherwise without abnormality on                            direct and retroflexion views. Complications:            No immediate complications. Estimated blood loss:                            None. Estimated Blood Loss:     Estimated blood loss: none. Impression:               - One 6 mm polyp in the sigmoid colon, removed with                            a cold snare. Resected and retrieved.                           - Post-polypectomy scar in the cecum.                           - The examination was otherwise normal on direct                            and retroflexion views. Recommendation:           - Repeat colonoscopy, likely 5 years, after studies                            are complete for surveillance based on pathology                             results.                           - Patient has a contact number available for                            emergencies. The signs and symptoms of potential                            delayed complications were discussed with the                            patient. Return to normal activities tomorrow.                            Written discharge instructions were provided to the                            patient.                           -  Resume previous diet.                           - Continue present medications.                           - Await pathology results. Ladene Artist, MD 09/29/2019 8:20:32 AM This report has been signed electronically.

## 2019-09-29 NOTE — Progress Notes (Signed)
Called to room to assist during endoscopic procedure.  Patient ID and intended procedure confirmed with present staff. Received instructions for my participation in the procedure from the performing physician.  

## 2019-09-29 NOTE — Progress Notes (Signed)
Pt's states no medical or surgical changes since previsit or office visit.  VS by VV.

## 2019-09-29 NOTE — Patient Instructions (Signed)
Impression/Recommendations:  Polyp handout given to patient.  Repeat colonoscopy for surveillance.  Date to be determined after pathology results reviewed.  Resume previous diet. Continue present medications. Await pathology results.  YOU HAD AN ENDOSCOPIC PROCEDURE TODAY AT [facility]:   Refer to the procedure report that was given to you for any specific questions about what was found during the examination.  If the procedure report does not answer your questions, please call your gastroenterologist to clarify.  If you requested that your care partner not be given the details of your procedure findings, then the procedure report has been included in a sealed envelope for you to review at your convenience later.  YOU SHOULD EXPECT: Some feelings of bloating in the abdomen. Passage of more gas than usual.  Walking can help get rid of the air that was put into your GI tract during the procedure and reduce the bloating. If you had a lower endoscopy (such as a colonoscopy or flexible sigmoidoscopy) you may notice spotting of blood in your stool or on the toilet paper. If you underwent a bowel prep for your procedure, you may not have a normal bowel movement for a few days.  Please Note:  You might notice some irritation and congestion in your nose or some drainage.  This is from the oxygen used during your procedure.  There is no need for concern and it should clear up in a day or so.  SYMPTOMS TO REPORT IMMEDIATELY:   Following lower endoscopy (colonoscopy or flexible sigmoidoscopy):  Excessive amounts of blood in the stool  Significant tenderness or worsening of abdominal pains  Swelling of the abdomen that is new, acute  Fever of 100F or higher For urgent or emergent issues, a gastroenterologist can be reached at any hour by calling [phone]. Do not use MyChart messaging for urgent concerns.    DIET:  We do recommend a small meal at first, but then you may proceed  to your regular diet.  Drink plenty of fluids but you should avoid alcoholic beverages for 24 hours.  ACTIVITY:  You should plan to take it easy for the rest of today and you should NOT DRIVE or use heavy machinery until tomorrow (because of the sedation medicines used during the test).    FOLLOW UP: Our staff will call the number listed on your records 48-72 hours following your procedure to check on you and address any questions or concerns that you may have regarding the information given to you following your procedure. If we do not reach you, we will leave a message.  We will attempt to reach you two times.  During this call, we will ask if you have developed any symptoms of COVID 19. If you develop any symptoms (ie: fever, flu-like symptoms, shortness of breath, cough etc.) before then, please call [phone].  If you test positive for Covid 19 in the 2 weeks post procedure, please call and report this information to [place].    If any biopsies were taken you will be contacted by phone or by letter within the next 1-3 weeks.  Please call us at [phone] if you have not heard about the biopsies in 3 weeks.    SIGNATURES/CONFIDENTIALITY: You and/or your care partner have signed paperwork which will be entered into your electronic medical record.  These signatures attest to the fact that that the information above on your After Visit Summary has been reviewed and is understood.  Full responsibility of  the confidentiality of this discharge information lies with you and/or your care-partner. 

## 2019-09-29 NOTE — Progress Notes (Signed)
Report to PACU, RN, vss, BBS= Clear.  

## 2019-10-01 ENCOUNTER — Encounter: Payer: Self-pay | Admitting: Family Medicine

## 2019-10-01 ENCOUNTER — Telehealth: Payer: Self-pay

## 2019-10-01 DIAGNOSIS — R0789 Other chest pain: Secondary | ICD-10-CM

## 2019-10-01 NOTE — Telephone Encounter (Signed)
  Follow up Call-  Call back number 09/29/2019  Post procedure Call Back phone  # [phone]  Permission to leave phone message Yes  Some recent data might be hidden     Patient questions:  Do you have a fever, pain , or abdominal swelling? No. Pain Score  0 *  Have you tolerated food without any problems? Yes.    Have you been able to return to your normal activities? Yes.    Do you have any questions about your discharge instructions: Diet   No. Medications  No. Follow up visit  No.  Do you have questions or concerns about your Care? No.  Actions: * If pain score is 4 or above: 1. No action needed, pain <4.Have you developed a fever since your procedure? no  2.   Have you had an respiratory symptoms (SOB or cough) since your procedure? no  3.   Have you tested positive for COVID 19 since your procedure no  4.   Have you had any family members/close contacts diagnosed with the COVID 19 since your procedure?  no   If yes to any of these questions please route to Joylene John, RN and Erenest Rasher, RN

## 2019-10-03 ENCOUNTER — Other Ambulatory Visit: Payer: Self-pay | Admitting: Surgery

## 2019-10-03 ENCOUNTER — Encounter: Payer: Self-pay | Admitting: Gastroenterology

## 2019-10-03 DIAGNOSIS — N6091 Unspecified benign mammary dysplasia of right breast: Secondary | ICD-10-CM | POA: Diagnosis not present

## 2019-10-03 DIAGNOSIS — R072 Precordial pain: Secondary | ICD-10-CM

## 2019-10-03 DIAGNOSIS — R079 Chest pain, unspecified: Secondary | ICD-10-CM | POA: Diagnosis not present

## 2019-10-08 ENCOUNTER — Telehealth: Payer: Self-pay | Admitting: *Deleted

## 2019-10-08 NOTE — Telephone Encounter (Signed)
Patient is scheduled for CT w/contrast tomorrow (ordered by another provider). She asks if there is any contraindication for her to have contrast since she takes imatinib Dr. Irene Limbo informed Per Dr. Irene Limbo - No contraindication for contrast - contacted patient and she verbalized understanding

## 2019-10-09 ENCOUNTER — Other Ambulatory Visit: Payer: Self-pay

## 2019-10-09 ENCOUNTER — Ambulatory Visit
Admission: RE | Admit: 2019-10-09 | Discharge: 2019-10-09 | Disposition: A | Discharge status: Home / Self Care | Payer: BC Managed Care – PPO | Source: Ambulatory Visit | Attending: Surgery | Admitting: Surgery

## 2019-10-09 DIAGNOSIS — R072 Precordial pain: Secondary | ICD-10-CM

## 2019-10-09 DIAGNOSIS — J9811 Atelectasis: Secondary | ICD-10-CM | POA: Diagnosis not present

## 2019-10-09 DIAGNOSIS — J439 Emphysema, unspecified: Secondary | ICD-10-CM | POA: Diagnosis not present

## 2019-10-09 MED ORDER — IOPAMIDOL (ISOVUE-300) INJECTION 61%
75.0000 mL | Freq: Once | INTRAVENOUS | Status: AC | PRN
  Administered 2019-10-09: 75 mL via INTRAVENOUS

## 2019-10-14 NOTE — Progress Notes (Signed)
I let her know that the chest CT was unremarkable.  We will follow-up with her in 6 months.

## 2019-10-15 ENCOUNTER — Other Ambulatory Visit: Payer: Self-pay

## 2019-10-15 ENCOUNTER — Inpatient Hospital Stay: Payer: BC Managed Care – PPO | Attending: Family

## 2019-10-15 DIAGNOSIS — C9210 Chronic myeloid leukemia, BCR/ABL-positive, not having achieved remission: Secondary | ICD-10-CM | POA: Insufficient documentation

## 2019-10-15 LAB — CBC WITH DIFFERENTIAL/PLATELET
Abs Immature Granulocytes: 0.01 10*3/uL (ref 0.00–0.07)
Basophils Absolute: 0.1 10*3/uL (ref 0.0–0.1)
Basophils Relative: 1 %
Eosinophils Absolute: 0.3 10*3/uL (ref 0.0–0.5)
Eosinophils Relative: 5 %
HCT: 40.2 % (ref 36.0–46.0)
Hemoglobin: 13.8 g/dL (ref 12.0–15.0)
Immature Granulocytes: 0 %
Lymphocytes Relative: 32 %
Lymphs Abs: 2.1 10*3/uL (ref 0.7–4.0)
MCH: 30.6 pg (ref 26.0–34.0)
MCHC: 34.3 g/dL (ref 30.0–36.0)
MCV: 89.1 fL (ref 80.0–100.0)
Monocytes Absolute: 0.6 10*3/uL (ref 0.1–1.0)
Monocytes Relative: 8 %
Neutro Abs: 3.5 10*3/uL (ref 1.7–7.7)
Neutrophils Relative %: 54 %
Platelets: 281 10*3/uL (ref 150–400)
RBC: 4.51 MIL/uL (ref 3.87–5.11)
RDW: 13.3 % (ref 11.5–15.5)
WBC: 6.5 10*3/uL (ref 4.0–10.5)
nRBC: 0 % (ref 0.0–0.2)

## 2019-10-15 LAB — CMP (CANCER CENTER ONLY)
ALT: 43 U/L (ref 0–44)
AST: 29 U/L (ref 15–41)
Albumin: 4.1 g/dL (ref 3.5–5.0)
Alkaline Phosphatase: 66 U/L (ref 38–126)
Anion gap: 4 — ABNORMAL LOW (ref 5–15)
BUN: 10 mg/dL (ref 6–20)
CO2: 28 mmol/L (ref 22–32)
Calcium: 9.7 mg/dL (ref 8.9–10.3)
Chloride: 107 mmol/L (ref 98–111)
Creatinine: 1.02 mg/dL — ABNORMAL HIGH (ref 0.44–1.00)
GFR, Est AFR Am: >60 mL/min (ref >60)
GFR, Estimated: >60 mL/min (ref >60)
Glucose, Bld: 94 mg/dL (ref 70–99)
Potassium: 4.5 mmol/L (ref 3.5–5.1)
Sodium: 139 mmol/L (ref 135–145)
Total Bilirubin: 0.4 mg/dL (ref 0.3–1.2)
Total Protein: 7 g/dL (ref 6.5–8.1)

## 2019-10-17 LAB — CARNITINE / ACYLCARNITINE PROFILE, BLD
Carnitine, Esterfied/Free: 0.3 Ratio (ref 0.0–0.9)
Carnitine, Free: 51 umol/L (ref 20–55)
Carnitine, Total: 67 umol/L (ref 27–73)

## 2019-10-17 LAB — COPPER, SERUM: Copper: 74 ug/dL — ABNORMAL LOW (ref 80–158)

## 2019-10-20 DIAGNOSIS — M940 Chondrocostal junction syndrome [Tietze]: Secondary | ICD-10-CM | POA: Diagnosis not present

## 2019-10-21 ENCOUNTER — Telehealth: Payer: Self-pay | Admitting: *Deleted

## 2019-10-21 NOTE — Telephone Encounter (Signed)
Patient called - Has an MRI scheduled this Friday (ordered by orthopedist). Wants to know if she should stop her Imatanib before due to use of contrast. Dr. Irene Limbo informed Contacted patient with Dr. Grier Mitts response: can hold Imatinib the day of MRI w/contrast. Patient verbalized understanding.  Patient stated this observation - when that when she was taking a 400 mg tablet and cutting it on scored line for 200 mg dose, the cramping, muscle pain and brain fog decreased substantially. She states now that she has begun taking TWO 100 mg tablets, those side effects have returned.

## 2019-10-22 LAB — BCR/ABL

## 2019-10-24 ENCOUNTER — Other Ambulatory Visit: Payer: Self-pay | Admitting: Orthopedic Surgery

## 2019-10-24 DIAGNOSIS — M940 Chondrocostal junction syndrome [Tietze]: Secondary | ICD-10-CM

## 2019-10-24 DIAGNOSIS — C9590 Leukemia, unspecified not having achieved remission: Secondary | ICD-10-CM

## 2019-10-24 DIAGNOSIS — R079 Chest pain, unspecified: Secondary | ICD-10-CM

## 2019-10-28 NOTE — Progress Notes (Signed)
HEMATOLOGY/ONCOLOGY CLINIC NOTE  Date of Service: 10/28/2019  Patient Care Team: Copland, Gay Filler, MD as PCP - General (Family Medicine)  CHIEF COMPLAINTS/PURPOSE OF CONSULTATION:  Chronic Myeloid Leukemia  HISTORY OF PRESENTING ILLNESS:   Victoria Hall is a wonderful 49 y.o. female who has been referred to [place] by Dr. Silvestre Hall for evaluation and management of CML. The pt was formerly under the care of my colleague Dr. Burney Hall, and has transferred her care closer to home. The pt reports that she is doing well overall.  Prior to today's visit, the pt was recently diagnosed with CML after a 05/16/18 bone marrow biopsy revealed a translocation 9;22 and FISH study revealed BCR-ABL gene rearranagement in 85% of cells. The pt was prescribed '400mg'$  Imatinib with Dr. Marin Hall on 06/03/18.  The pt reports that she had intermittent blurry vision for the last 4 years. She notes that she had night sweats for the last 20 years. She notes that her WBC have been elevated for the last 4 years. She notes that she has had elevated Basophils since April 2018. She has been evaluated for mast cell activation, and has had normal tryptase levels, including most recently on 06/03/18.  She reports that she went to the [facility] in October 2019 for suspicions of dysautonomia. The pt notes that she has "exercise intolerance," for the last 4 years, which she describes as needing to "lie on the couch for 2 weeks," after taking a run.  She has had intermittent rashes and hives for the last 20 years, which do resolve on their own. She keeps a food and activity journal for the last 2 years, and denies awareness of any associations with her rash onset. She has seen dermatology and immunology regarding this. The pt has also seen a rheumatologist for evaluation.  The pt notes that her hands and feet frequently fall asleep, and has been evaluated by neurology with an NCV. She notes that she has cervical  radiculopathy which she relates to her 2005 C5-7 fusion. She endorses exhaustion which "comes on out of no where." She notes her energy levels change "from minute to minute."   The pt denies abdominal pains or mouth sores.   The pt endorse frequent muscle and joint cramping as well. She has tried using CBD oil which she found to be helpful, but notes that the cost became prohibitive.  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  Most recent lab results (06/03/18) of CBC w/diff and CMP is as follows: all values are WNL except for WBC at 18.8k, ANC at 12.9k Basophils abs at 600, Abs immature granulocytes at 1.02k 06/24/18 LDH at 237  On review of systems, pt reports intermittent sudden-onset fatigue, intermittent headaches, intermittent hives and rashes, muscle and joint cramping, and denies abdominal pains, mouth sores, leg swelling, and any other symptoms.   On PMHx the pt reports chronic cervical radiculopathy with fusion of C5-7 in 2005. 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 She endorses concern for chemical exposure related to her hobbies of painting and furniture restoration.  Interval History:   I connected with  Victoria Hall on 10/28/19 by telephone and verified that I am speaking with the correct person using two identifiers.   I discussed the limitations of evaluation and management by telemedicine. The patient expressed understanding and agreed to proceed.  Other persons participating in the  visit and their role in the encounter:        -Victoria Hall, Medical Scribe  Patient's location: Home Provider's location: [place] [facility] [place] is a wonderful 49 y.o. female who is here for evaluation and management of CML. The patient's last visit with [place] was on 08/05/2019. The pt reports that she is doing well overall.  The pt reports that she has noticed  improved hand and foot cramping with the 200 mg dose of Imatinib. Pt denies using any significant sources of Zinc. She has had some bone aches in her shins and feet. These areas are also tender to the touch.   Pt is currently being worked up by an Doctor, general practice, Dr. Mardelle Hall, for hard painful lumps along her chest wall. Breast biopsies have already been performed and they were found to be benign. She and Dr. Georgette Hall have created a plan for continued monitoring. She has also recently been evaluated by an Endocrinologist that has not returned any new findings.   Of note since the patient's last visit, pt has had Right Breast Needle Core Biopsy Surgical Pathology [phone]) completed on 09/11/2019 with results revealing "FIBROCYSTIC CHANGE. FIBROADENOMATOID CHANGE. ATYPICAL LOBULAR HYPERPLASIA. MICROCALCIFICATIONS ARE ASSOCIATED WITH THESE PROCESSES".  Pt has had Sigmoid polyp Surgical Pathology ([id number]) completed on 09/29/2019 with results revealing "TUBULAR ADENOMA. - NO HIGH GRADE DYSPLASIA OR MALIGNANCY".  Lab results (10/15/19) of CBC w/diff and CMP is as follows: all values are WNL except for Creatinine at 1.02, Anion gap at 4. 10/15/2019 Carnitine/acylcarnitine profile is as follows: Total Carnitine at 67, Free Carnitine at 51, Esterified/Free Carnitine at 0.3. 10/15/2019 Copper at 74  On review of systems, pt reports bone pain, fatigue, SOB, improving muscle cramps and denies any other symptoms.    MEDICAL HISTORY:  Past Medical History:  Diagnosis Date  . Anxiety    Past hx- [facility] due to Low BP   . Arthritis   . Asthma    early teens only   . Cervical intraepithelial neoplasia grade 3 2002   s/p LEEP Rx repeat pap negative  . Cervical radiculopathy   . CML (chronic myelocytic leukemia) ([place]) 06/03/2018  . Fallopian tube disorder    right fallopian tube removed  . Gallstones 09/2015   On CT  . Headache    Migraines  . Hemorrhagic ovarian cyst 02/08/2016  .  Hydrosalpinx    [facility]  . Hydrosalpinx 02/08/2016  . Interstitial cystitis   . Neurological abnormality    Neg workup with MRI/MRA in 2007 WNL except decreased caliber MCA proximally, distal cavernous portion of left LCA which may represent true stenosis or technique on exam. Evaluated by Dr Linus Salmons.  . Palpitations   . Partial seizures (HCC)    questionable diag  . Pneumonia    walking pneumonia  . Polycystic ovary    multiple ovarian cysts removed  . S/P cervical discectomy    Dr Vertell Limber, anterior discectomy C5-C7  . S/P epidural steroid injection    Right hip  . Seizures ([place]) 2005   simple partial seizure disorder-was mast cell problems per pt 09-15-2019- no seizures per pt     SURGICAL HISTORY: Past Surgical History:  Procedure Laterality Date  . APPENDECTOMY  1986  . BUNIONECTOMY Left    bunion removal  . CERVICAL DISC SURGERY  2005   C5-C7  . COLONOSCOPY    . core breast biopsy     . DENTAL SURGERY    .  fallopian tue removed    . LAPAROSCOPIC BILATERAL SALPINGECTOMY Left 02/09/2016   Procedure: LAPAROSCOPIC LEFT  SALPINGECTOMY;  Surgeon: Janyth Contes, MD;  Location: [facility];  Service: Gynecology;  Laterality: Left;  . LAPAROSCOPIC LYSIS OF ADHESIONS  02/09/2016   Procedure: LAPAROSCOPIC LYSIS OF ADHESIONS;  Surgeon: Janyth Contes, MD;  Location: [facility];  Service: Gynecology;;  momentum to adenexa  . LAPAROSCOPIC OVARIAN CYSTECTOMY Right 02/09/2016   Procedure: LAPAROSCOPIC OVARIAN CYSTECTOMY;  Surgeon: Janyth Contes, MD;  Location: [facility];  Service: Gynecology;  Laterality: Right;  x2 to right ovary  . LEEP  1998  . OOPHORECTOMY Left 02/09/2016   Procedure: LEFT OOPHORECTOMY;  Surgeon: Janyth Contes, MD;  Location: [facility];  Service: Gynecology;  Laterality: Left;- pt states left ovary has NOT been removed   . POLYPECTOMY    . TONSILLECTOMY  2001  . UPPER GASTROINTESTINAL ENDOSCOPY    . UPPER GI ENDOSCOPY      SOCIAL  HISTORY: Social History   Socioeconomic History  . Marital status: Married    Spouse name: Not on file  . Number of children: 0  . Years of education: Not on file  . Highest education level: Not on file  Occupational History  . 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  . Occupation: Pharmacist, hospital  Tobacco Use  . Smoking status: Former Smoker    Packs/day: 0.00    Years: 0.00    Pack years: 0.00    Types: Cigarettes    Quit date: 11/02/2015    Years since quitting: 3.9  . Smokeless tobacco: Never Used  . Tobacco comment: currently smoking, began at beginning of June and plans to quit on June 14th. Has smoked off and on in the past.  Vaping Use  . Vaping Use: Every day  Substance and Sexual Activity  . Alcohol use: Yes    Alcohol/week: 1.0 - 2.0 standard drink    Types: 1 - 2 Standard drinks or equivalent per week    Comment: occasionally 0-2 per day- rare   . Drug use: No  . Sexual activity: Yes  Other Topics Concern  . Not on file  Social History Narrative  . Not on file   Social Determinants of Health   Financial Resource Strain:   . Difficulty of Paying Living Expenses:   Food Insecurity:   . Worried About Charity fundraiser in the Last Year:   . Arboriculturist in the Last Year:   Transportation Needs:   . Film/video editor (Medical):   Marland Kitchen Lack of Transportation (Non-Medical):   Physical Activity:   . Days of Exercise per Week:   . Minutes of Exercise per Session:   Stress:   . Feeling of Stress :   Social Connections:   . Frequency of Communication with Friends and Family:   . Frequency of Social Gatherings with Friends and Family:   . Attends Religious Services:   . Active Member of Clubs or Organizations:   . Attends Archivist Meetings:   Marland Kitchen Marital Status:   Intimate Partner Violence:   . Fear of Current or Ex-Partner:   . Emotionally Abused:   Marland Kitchen Physically Abused:   . Sexually Abused:      FAMILY HISTORY: Family History  Adopted: Yes  Problem Relation Age of Onset  . COPD Mother   . Breast cancer Mother   . Other Mother  Teeth problems- all removed by age 28  . Rashes / Skin problems Sister   . Other Sister        Teeth problems- all removed by age 37  . Rashes / Skin problems Brother   . Asthma Brother   . Other Brother        Teeth problems- all removed by age 81  . Breast cancer Maternal Aunt   . Adrenal disorder Neg Hx   . Colon cancer Neg Hx   . Colon polyps Neg Hx   . Esophageal cancer Neg Hx   . Stomach cancer Neg Hx   . Rectal cancer Neg Hx     ALLERGIES:  is allergic to clindamycin/lincomycin, codeine, cyclobenzaprine, gabapentin, nyquil multi-symptom [pseudoeph-doxylamine-dm-apap], uribel [meth-hyo-m bl-na phos-ph sal], xanax [alprazolam], and urelle.  MEDICATIONS:  Current Outpatient Medications  Medication Sig Dispense Refill  . amphetamine-dextroamphetamine (ADDERALL) 10 MG tablet Take 0.5-1 tablets (5-10 mg total) by mouth 2 (two) times daily. Use as needed for fatigue (Patient not taking: Reported on 08/08/2019) 60 tablet 0  . Ascorbic Acid (VITAMIN C) 100 MG tablet Take 100 mg by mouth daily.    . cholecalciferol (VITAMIN D3) 25 MCG (1000 UNIT) tablet Take 1,000 Units by mouth once a week. (Patient not taking: Reported on 09/29/2019)    . diazepam (VALIUM) 2 MG tablet Take 0.5-1 tablets (1-2 mg total) by mouth every 8 (eight) hours as needed for anxiety. (Patient not taking: Reported on 08/08/2019) 30 tablet 0  . famotidine (PEPCID) 20 MG tablet Take 10 mg by mouth daily.     Marland Kitchen imatinib (GLEEVEC) 100 MG tablet TAKE 2 TABLETS (200 MG TOTAL) BY MOUTH DAILY. TAKE WITH MEALS AND LARGE GLASS OF WATER.CAUTION:CHEMOTHERAPY 60 tablet 1  . loratadine (CLARITIN) 10 MG tablet Take 10 mg by mouth daily.     . tizanidine (ZANAFLEX) 2 MG capsule Take 1 capsule (2 mg total) by mouth 3 (three) times daily as needed for muscle spasms. (Patient not taking:  Reported on 08/08/2019) 30 capsule 1   No current facility-administered medications for this visit.    REVIEW OF SYSTEMS:   A 10+ POINT REVIEW OF SYSTEMS WAS OBTAINED including neurology, dermatology, psychiatry, cardiac, respiratory, lymph, extremities, GI, GU, Musculoskeletal, constitutional, breasts, reproductive, HEENT.  All pertinent positives are noted in the HPI.  All others are negative.   PHYSICAL EXAMINATION: There were no vitals filed for this visit. Wt Readings from Last 3 Encounters:  09/29/19 186 lb (84.4 kg)  09/15/19 186 lb (84.4 kg)  08/08/19 187 lb (84.8 kg)   There is no height or weight on file to calculate BMI.    Telehealth visit 10/28/19   LABORATORY DATA:  I have reviewed the data as listed  . CBC Latest Ref Rng & Units 10/15/2019 07/22/2019 05/21/2019  WBC 4.0 - 10.5 K/uL 6.5 7.1 7.1  Hemoglobin 12.0 - 15.0 g/dL 13.8 13.7 13.4  Hematocrit 36 - 46 % 40.2 39.7 38.3  Platelets 150 - 400 K/uL 281 282 276   . CBC    Component Value Date/Time   WBC 6.5 10/15/2019 1031   RBC 4.51 10/15/2019 1031   HGB 13.8 10/15/2019 1031   HGB 12.5 01/16/2019 1402   HGB 15.1 03/26/2017 1101   HCT 40.2 10/15/2019 1031   HCT 44.0 03/26/2017 1101   PLT 281 10/15/2019 1031   PLT 259 01/16/2019 1402   PLT 330 03/26/2017 1101   MCV 89.1 10/15/2019 1031   MCV 84 03/26/2017 1101  MCH 30.6 10/15/2019 1031   MCHC 34.3 10/15/2019 1031   RDW 13.3 10/15/2019 1031   RDW 14.0 03/26/2017 1101   LYMPHSABS 2.1 10/15/2019 1031   LYMPHSABS 2.8 03/26/2017 1101   MONOABS 0.6 10/15/2019 1031   EOSABS 0.3 10/15/2019 1031   EOSABS 0.4 03/26/2017 1101   BASOSABS 0.1 10/15/2019 1031   BASOSABS 0.2 03/26/2017 1101     . CMP Latest Ref Rng & Units 10/15/2019 07/22/2019 05/21/2019  Glucose 70 - 99 mg/dL 94 97 90  BUN 6 - 20 mg/dL '10 9 11  '$ Creatinine 0.44 - 1.00 mg/dL 1.02(H) 0.96 0.86  Sodium 135 - 145 mmol/L 139 138 140  Potassium 3.5 - 5.1 mmol/L 4.5 4.4 4.1  Chloride 98 - 111  mmol/L 107 105 106  CO2 22 - 32 mmol/L '28 27 25  '$ Calcium 8.9 - 10.3 mg/dL 9.7 9.4 9.0  Total Protein 6.5 - 8.1 g/dL 7.0 7.1 7.0  Total Bilirubin 0.3 - 1.2 mg/dL 0.4 0.5 0.5  Alkaline Phos 38 - 126 U/L 66 69 69  AST 15 - 41 U/L '29 23 23  '$ ALT 0 - 44 U/L 43 27 31   10/15/2019 BCR/ABL:    05/21/2019 BCR/ABL:    03/26/2019 BCR/ABL:    01/28/2019 FISH:   07/25/18 Initial BCR-ABL:    06/03/18 FISH:   05/16/18 Cytogenetics:   04/10/18 Hemochromatosis Panel:    RADIOGRAPHIC STUDIES: I have personally reviewed the radiological images as listed and agreed with the findings in the report. CT CHEST W CONTRAST  Result Date: 10/10/2019 CLINICAL DATA:  Chest pain and shortness of breath. History of leukemia. EXAM: CT CHEST WITH CONTRAST TECHNIQUE: Multidetector CT imaging of the chest was performed during intravenous contrast administration. CONTRAST:  48m ISOVUE-300 IOPAMIDOL (ISOVUE-300) INJECTION 61% COMPARISON:  Chest radiograph July 28, 2019 FINDINGS: Cardiovascular: There is no thoracic aortic aneurysm or dissection. Visualized great vessels appear unremarkable. There is no appreciable pericardial effusion or pericardial thickening. No major vessel pulmonary embolus demonstrated. Mediastinum/Nodes: Thyroid appears normal. There is no appreciable thoracic adenopathy. No esophageal lesions are appreciable. No evident pneumomediastinum. Lungs/Pleura: No pneumothorax. A small bulla is noted in the medial left upper lobe. There is slight bibasilar atelectasis. No edema or airspace opacity. No evident pleural effusions. Upper Abdomen: There is hepatic steatosis. Visualized upper abdominal structures otherwise appear unremarkable. Musculoskeletal: Postoperative change in lower cervical region. No blastic or lytic bone lesions. No evident chest wall lesions. IMPRESSION: 1. Mild bibasilar atelectasis. No edema or airspace opacity. No pleural effusions. No pneumothorax. 2.  No evident adenopathy. 3.  No thoracic aortic aneurysm or dissection. No major vessel pulmonary embolus. No pericardial thickening or effusion. 4.  Hepatic steatosis. Electronically Signed   By: WLowella GripIII M.D.   On: 10/10/2019 08:37    01/31/18 ECHO Transthoracic:    ASSESSMENT & PLAN:   49y.o. female with  1. CML- Chronic phase 05/16/18 BM Cytogenetics revealed translocation 9;22. FISH report revealed BCR-ABL gene rearrangement present in 86.5% of cells 06/03/18 FISH revealed BCR-ABL gene rearrangement present in 85.67% of cells, with some hypercellularity around 80% 06/05/18 [place]dom[place] revealed normal spleen size 01/31/18 ECHO revealed LV EF of 63%, no regional wall motion abnormalities, no significant valvular hear disease  Initial pre-treatment 07/25/18 BCR-ABL revealed IS at 71.1362%  08/28/18 EKG reviewed  2. 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 -The pt has no prohibitive toxicities from continuing 200 mg Imatinib at this time. Patient requesting '400mg'$  tabs that she can divide and take 1/2 tab ('200mg'$ ) a day - reports she tolerates this better than taking 2 x '100mg'$  tabs per day. -Recommend pt begin 2 mg Copper on M,W,F. Will recheck levels with next labs.  -Will see back in 3 months via phone, with labs 2 weeks prior   FOLLOW UP: Labs in 10 weeks Phone with Dr Irene Limbo in 12 weeks   The total time spent in the appt was 20 minutes and more than 50% was on counseling and direct patient cares.  All of the patient's questions were answered with apparent satisfaction. The patient knows to call the clinic with any problems, questions or concerns.   Sullivan Lone MD [place] AAHIVMS [facility]  [facility] Hematology/Oncology Physician [facility]  (Office):       [phone] (Work cell):  [phone] (Fax):           [phone]  10/28/2019 4:55 PM  I, Victoria Hall, am acting as a scribe for Dr. Sullivan Lone.   .I have reviewed the above documentation for accuracy and completeness, and I agree with the above. Brunetta Genera MD

## 2019-10-29 ENCOUNTER — Inpatient Hospital Stay (HOSPITAL_BASED_OUTPATIENT_CLINIC_OR_DEPARTMENT_OTHER): Payer: BC Managed Care – PPO | Admitting: Hematology

## 2019-10-29 ENCOUNTER — Encounter: Payer: Self-pay | Admitting: *Deleted

## 2019-10-29 DIAGNOSIS — E610 Copper deficiency: Secondary | ICD-10-CM | POA: Diagnosis not present

## 2019-10-29 DIAGNOSIS — C9210 Chronic myeloid leukemia, BCR/ABL-positive, not having achieved remission: Secondary | ICD-10-CM | POA: Diagnosis not present

## 2019-10-29 MED ORDER — IMATINIB MESYLATE 400 MG PO TABS: 200.0000 mg | ORAL_TABLET | Freq: Every day | ORAL | 3 refills | Status: DC

## 2019-10-29 NOTE — Progress Notes (Signed)
Letter requested by patient stating: who her provider is, when she was diagnosed/began treatment and how long treatment is expected to last.  Letter printed and signed by Dr. Irene Limbo. At patient request, 2 copies of letter prepared.  Letters printed, placed in envelope at reception desk of [facility] for pick up. Contacted patient/spouse and left message: letters ready to be picked up at reception desk or can be mailed - and requested they contact office to specify which is preferred.

## 2019-11-02 ENCOUNTER — Ambulatory Visit
Admission: RE | Admit: 2019-11-02 | Discharge: 2019-11-02 | Disposition: A | Discharge status: Home / Self Care | Payer: BC Managed Care – PPO | Source: Ambulatory Visit | Attending: Orthopedic Surgery | Admitting: Orthopedic Surgery

## 2019-11-02 DIAGNOSIS — M940 Chondrocostal junction syndrome [Tietze]: Secondary | ICD-10-CM | POA: Diagnosis not present

## 2019-11-02 DIAGNOSIS — R0781 Pleurodynia: Secondary | ICD-10-CM | POA: Diagnosis not present

## 2019-11-02 DIAGNOSIS — R079 Chest pain, unspecified: Secondary | ICD-10-CM

## 2019-11-02 DIAGNOSIS — C9590 Leukemia, unspecified not having achieved remission: Secondary | ICD-10-CM

## 2019-11-02 MED ORDER — GADOBENATE DIMEGLUMINE 529 MG/ML IV SOLN
18.0000 mL | Freq: Once | INTRAVENOUS | Status: AC | PRN
  Administered 2019-11-02: 18 mL via INTRAVENOUS

## 2019-11-07 ENCOUNTER — Telehealth: Payer: Self-pay | Admitting: Hematology

## 2019-11-07 NOTE — Telephone Encounter (Signed)
Scheduled per 07/28 los, patient has been called and notified. 

## 2019-11-10 ENCOUNTER — Telehealth: Payer: Self-pay | Admitting: *Deleted

## 2019-11-10 NOTE — Telephone Encounter (Signed)
Patient called - was prescribed a Medrol Dose pack by Dr. Mardelle Matte for costochondritis. She wants to know if she should hold Imatinib while she takes it? Dr. Irene Limbo informed.  Contacted patient with Dr. Grier Mitts response: She does not need to hold Imatinib with medrol dose pack. Patient verbalized understanding

## 2019-11-19 ENCOUNTER — Other Ambulatory Visit: Payer: BC Managed Care – PPO

## 2019-12-02 ENCOUNTER — Telehealth: Payer: Self-pay | Admitting: Gastroenterology

## 2019-12-02 ENCOUNTER — Encounter: Payer: Self-pay | Admitting: Family Medicine

## 2019-12-02 ENCOUNTER — Other Ambulatory Visit: Payer: Self-pay

## 2019-12-02 ENCOUNTER — Ambulatory Visit: Payer: BC Managed Care – PPO | Admitting: Family Medicine

## 2019-12-02 VITALS — BP 110/80 | HR 83 | Temp 98.6°F | Resp 18 | Ht 66.0 in | Wt 187.0 lb

## 2019-12-02 DIAGNOSIS — R1310 Dysphagia, unspecified: Secondary | ICD-10-CM

## 2019-12-02 NOTE — Patient Instructions (Signed)
Dysphagia Eating Plan, Bite Size Food This diet plan is for people with moderate swallowing problems who have transitioned from pureed and minced foods. Bite size foods are soft and cut into small chunks so that they can be swallowed safely. On this eating plan, you may be instructed to drink liquids that are thickened. Work with your health care provider and your diet and nutrition specialist (dietitian) to make sure that you are following the diet safely and getting all the nutrients you need. What are tips for following this plan? General guidelines for foods   You may eat foods that are tender, soft, and moist.  Always test food texture before taking a bite. Poke food with a fork or spoon to make sure it is tender.  Food should be easy to cut and shew. Avoid large pieces of food that require a lot of chewing.  Take small bites. Each bite should be smaller than your thumb nail (about 15mm by 15 mm).  If you were on pureed and minced food diet plans, you may eat any of the foods included in those diets.  Avoid foods that are very dry, hard, sticky, chewy, coarse, or crunchy.  If instructed by your health care provider, thicken liquids. Follow your health care provider's instructions for what products to use, how to do this, and to what thickness. ? Your health care provider may recommend using a commercial thickener, rice cereal, or potato flakes. Ask your health care provider to recommend thickeners. ? Thickened liquids are usually a "pudding-like" consistency, or they may be as thick as honey or thick enough to eat with a spoon. Cooking  To moisten foods, you may add liquids while you are blending, mashing, or grinding your foods to the right consistency. These liquids include gravies, sauces, vegetable or fruit juice, milk, half and half, or water.  Strain extra liquid from foods before eating.  Reheat foods slowly to prevent a tough crust from forming.  Prepare foods in advance.  Meal planning  Eat a variety of foods to get all the nutrients you need.  Some foods may be tolerated better than others. Work with your dietitian to identify which foods are safest for you to eat.  Follow your meal plan as told by your dietitian. What foods are allowed? Grains Moist breads without nuts or seeds. Biscuits, muffins, pancakes, and waffles that are well-moistened with syrup, jelly, margarine, or butter. Cooked cereals. Moist bread stuffing. Moist rice. Well-moistened cold cereal with small chunks. Well-cooked pasta, noodles, rice, and bread dressing in small pieces and thick sauce. Soft dumplings or spaetzle in small pieces and butter or gravy. Vegetables Soft, well-cooked vegetables in small pieces. Soft-cooked, mashed potatoes. Thickened vegetable juice. Fruits Canned or cooked fruits that are soft or moist and do not have skin or seeds. Fresh, soft bananas. Thickened fruit juices. Meat and other protein foods Tender, moist meats or poultry in small pieces. Moist meatballs or meatloaf. Fish without bones. Eggs or egg substitutes in small pieces. Tofu. Tempeh and meat alternatives in small pieces. Well-cooked, tender beans, peas, baked beans, and other legumes. Dairy Thickened milk. Cream cheese. Yogurt. Cottage cheese. Sour cream. Small pieces of soft cheese. Fats and oils Butter. Oils. Margarine. Mayonnaise. Gravy. Spreads. Sweets and desserts Soft, smooth, moist desserts. Pudding. Custard. Moist cakes. Jam. Jelly. Honey. Preserves. Ask your health care provider whether you can have frozen desserts. Seasoning and other foods All seasonings and sweeteners. All sauces with small chunks. Prepared tuna, egg, or chicken   salad without raw fruits or vegetables. Moist casseroles with small, tender pieces of meat. Soups with tender meat. What foods are not allowed? Grains Coarse or dry cereals. Dry breads. Toast. Crackers. Tough, crusty breads, such as French bread and baguettes.  Dry pancakes, waffles, and muffins. Sticky rice. Dry bread stuffing. Granola. Popcorn. Chips. Vegetables All raw vegetables. Cooked corn. Rubbery or stiff cooked vegetables. Stringy vegetables, such as celery. Tough, crisp fried potatoes. Potato skins. Fruits Hard, crunchy, stringy, high-pulp, and juicy raw fruits such as apples, pineapple, papaya, and watermelon. Small, round fruits, such as grapes. Dried fruit and fruit leather. Meat and other protein foods Large pieces of meat. Dry, tough meats, such as bacon, sausage, and hot dogs. Chicken, turkey, or fish with skin and bones. Crunchy peanut butter. Nuts. Seeds. Nut and seed butters. Dairy Yogurt with nuts, seeds, or large chunks. Large chunks of cheese. Frozen desserts and milk consistency not allowed by your dietitian. Sweets and desserts Dry cakes. Chewy or dry cookies. Any desserts with nuts, seeds, dry fruits, coconut, pineapple, or anything dry, sticky, or hard. Chewy caramel. Licorice. Taffy-type candies. Ask your health care provider whether you can have frozen desserts. Seasoning and other foods Soups with tough or large chunks of meats, poultry, or vegetables. Corn or clam chowder. Smoothies with large chunks of fruit. Summary  Bite size foods can be helpful for people with moderate swallowing problems.  On the dysphagia eating plan, you may eat foods that are soft, moist, and cut into pieces smaller than 15mm by 15mm.  You may be instructed to thicken liquids. Follow your health care provider's instructions about how to do this and to what consistency. This information is not intended to replace advice given to you by your health care provider. Make sure you discuss any questions you have with your health care provider. Document Revised: 07/11/2018 Document Reviewed: 06/30/2016 Elsevier Patient Education  2020 Elsevier Inc.  

## 2019-12-02 NOTE — Progress Notes (Signed)
Patient ID: Victoria Hall, female    DOB: 1971-02-25  Age: 49 y.o. MRN: [id number]    Subjective:  Subjective  HPI Victoria Hall presents for trouble swallowing x 2 episodes   She states it is like she forgot how to swallow  Pt is scared --- it does not occur every time but has occurred 2 x in the last few days.    Review of Systems  Constitutional: Negative for appetite change, diaphoresis, fatigue and unexpected weight change.  Eyes: Negative for pain, redness and visual disturbance.  Respiratory: Negative for cough, chest tightness, shortness of breath and wheezing.   Cardiovascular: Negative for chest pain, palpitations and leg swelling.  Gastrointestinal: Negative for abdominal pain, diarrhea, nausea, rectal pain and vomiting.  Endocrine: Negative for cold intolerance, heat intolerance, polydipsia, polyphagia and polyuria.  Genitourinary: Negative for difficulty urinating, dysuria and frequency.  Neurological: Negative for dizziness, light-headedness, numbness and headaches.    History Past Medical History:  Diagnosis Date  . Anxiety    Past hx- [facility] due to Low BP   . Arthritis   . Asthma    early teens only   . Cervical intraepithelial neoplasia grade 3 2002   s/p LEEP Rx repeat pap negative  . Cervical radiculopathy   . CML (chronic myelocytic leukemia) ([place]) 06/03/2018  . Fallopian tube disorder    right fallopian tube removed  . Gallstones 09/2015   On CT  . Headache    Migraines  . Hemorrhagic ovarian cyst 02/08/2016  . Hydrosalpinx    [facility]  . Hydrosalpinx 02/08/2016  . Interstitial cystitis   . Neurological abnormality    Neg workup with MRI/MRA in 2007 WNL except decreased caliber MCA proximally, distal cavernous portion of left LCA which may represent true stenosis or technique on exam. Evaluated by Dr Linus Salmons.  . Palpitations   . Partial seizures ([facility])    questionable diag  . Pneumonia    walking  pneumonia  . Polycystic ovary    multiple ovarian cysts removed  . S/P cervical discectomy    Dr Vertell Limber, anterior discectomy C5-C7  . S/P epidural steroid injection    Right hip  . Seizures ([place]) 2005   simple partial seizure disorder-was mast cell problems per pt 09-15-2019- no seizures per pt     She has a past surgical history that includes Appendectomy (1986); Tonsillectomy (2001); Bunionectomy (Left); Cervical disc surgery (2005); LEEP (1998); fallopian tue removed; Colonoscopy; Upper gi endoscopy; Dental surgery; Laparoscopic bilateral salpingectomy (Left, 02/09/2016); Oophorectomy (Left, 02/09/2016); Laparoscopic lysis of adhesions (02/09/2016); Laparoscopic ovarian cystectomy (Right, 02/09/2016); Polypectomy; Upper gastrointestinal endoscopy; and core breast biopsy .   Her family history includes Asthma in her brother; Breast cancer in her maternal aunt and mother; COPD in her mother; Other in her brother, mother, and sister; Rashes / Skin problems in her brother and sister. She was adopted.She reports that she quit smoking about 4 years ago. Her smoking use included cigarettes. She smoked 0.00 packs per day for 0.00 years. She has never used smokeless tobacco. She reports current alcohol use of about 1.0 - 2.0 standard drink of alcohol per week. She reports that she does not use drugs.  Current Outpatient Medications on File Prior to Visit  Medication Sig Dispense Refill  . Ascorbic Acid (VITAMIN C) 100 MG tablet Take 100 mg by mouth daily.    . famotidine (PEPCID) 20 MG tablet Take 10 mg by mouth daily.     Marland Kitchen  imatinib (GLEEVEC) 400 MG tablet Take 0.5 tablets (200 mg total) by mouth daily. Take with meals and large glass of water.Caution:Chemotherapy 30 tablet 3  . loratadine (CLARITIN) 10 MG tablet Take 10 mg by mouth daily.     Marland Kitchen amphetamine-dextroamphetamine (ADDERALL) 10 MG tablet Take 0.5-1 tablets (5-10 mg total) by mouth 2 (two) times daily. Use as needed for fatigue (Patient not  taking: Reported on 08/08/2019) 60 tablet 0  . cholecalciferol (VITAMIN D3) 25 MCG (1000 UNIT) tablet Take 1,000 Units by mouth once a week. (Patient not taking: Reported on 09/29/2019)    . diazepam (VALIUM) 2 MG tablet Take 0.5-1 tablets (1-2 mg total) by mouth every 8 (eight) hours as needed for anxiety. (Patient not taking: Reported on 08/08/2019) 30 tablet 0  . tizanidine (ZANAFLEX) 2 MG capsule Take 1 capsule (2 mg total) by mouth 3 (three) times daily as needed for muscle spasms. (Patient not taking: Reported on 08/08/2019) 30 capsule 1   No current facility-administered medications on file prior to visit.     Objective:  Objective  Physical Exam Vitals and nursing note reviewed.  Constitutional:      Appearance: She is well-developed.  HENT:     Head: Normocephalic and atraumatic.  Eyes:     Conjunctiva/sclera: Conjunctivae normal.  Neck:     Thyroid: No thyromegaly.     Vascular: No carotid bruit or JVD.  Cardiovascular:     Rate and Rhythm: Normal rate and regular rhythm.     Heart sounds: Normal heart sounds. No murmur heard.   Pulmonary:     Effort: Pulmonary effort is normal. No respiratory distress.     Breath sounds: Normal breath sounds. No wheezing or rales.  Chest:     Chest wall: No tenderness.  Abdominal:     General: There is no distension.     Tenderness: There is no abdominal tenderness. There is no guarding.  Musculoskeletal:     Cervical back: Normal range of motion and neck supple.  Neurological:     Mental Status: She is alert and oriented to person, place, and time.    BP 110/80 (BP Location: Right Arm, Patient Position: Sitting, Cuff Size: Normal)   Pulse 83   Temp 98.6 F (37 C) (Oral)   Resp 18   Ht 5\' 6"  (1.676 m)   Wt 187 lb (84.8 kg)   SpO2 98%   BMI 30.18 kg/m  Wt Readings from Last 3 Encounters:  12/02/19 187 lb (84.8 kg)  09/29/19 186 lb (84.4 kg)  09/15/19 186 lb (84.4 kg)     Lab Results  Component Value Date   WBC 6.5  10/15/2019   HGB 13.8 10/15/2019   HCT 40.2 10/15/2019   PLT 281 10/15/2019   GLUCOSE 94 10/15/2019   CHOL 194 07/23/2019   TRIG 180.0 (H) 07/23/2019   HDL 46.20 07/23/2019   LDLDIRECT 120 12/16/2014   LDLCALC 112 (H) 07/23/2019   ALT 43 10/15/2019   AST 29 10/15/2019   NA 139 10/15/2019   K 4.5 10/15/2019   CL 107 10/15/2019   CREATININE 1.02 (H) 10/15/2019   BUN 10 10/15/2019   CO2 28 10/15/2019   TSH 1.598 05/21/2019   INR 0.91 05/16/2018   HGBA1C 5.0 07/23/2019    MR CHEST W WO CONTRAST  Result Date: 11/02/2019 CLINICAL DATA:  Sternal and rib pain since 2019. Costochondritis. History of chronic myeloid leukemia diagnosed in February of 2020 EXAM: MRI CHEST WALL WITH AND WITHOUT  CONTRAST TECHNIQUE: Multiplanar, multiecho pulse sequences of the chest wall was obtained without and with intravenous contrast. CONTRAST:  28mL MULTIHANCE GADOBENATE DIMEGLUMINE 529 MG/ML IV SOLN COMPARISON:  CT chest 10/09/2019 FINDINGS: Bones/Joint/Cartilage The bone marrow signal of the sternum and anterior portions of the bilateral ribs is maintained. No bone marrow edema. No marrow replacing lesion. The signal of the costochondral cartilage is unremarkable. Bilateral sternoclavicular joints are normal in appearance. There is no associated soft tissue edema along the anterior chest wall. Portions of the thoracic and lower cervical spine are visualized on sagittal sequences. There is ACDF hardware within the lower cervical spine. Vertebral bodies appear otherwise within normal limits. Muscles and Tendons Medial portions of the bilateral pectoralis musculature is within normal limits without evidence of muscle strain or tear. Soft tissues No soft tissue fluid collection or evidence of mass. There is no areas of abnormal postcontrast enhancement. IMPRESSION: Unremarkable pre and post-contrast MRI of the anterior chest wall. No findings to explain patient's pain. No marrow replacing lesion identified.  Electronically Signed   By: Davina Poke D.O.   On: 11/02/2019 15:10     Assessment & Plan:  Plan  I am having Victoria Hall maintain her diazepam, famotidine, loratadine, tizanidine, amphetamine-dextroamphetamine, vitamin C, cholecalciferol, and imatinib.  No orders of the defined types were placed in this encounter.   Problem List Items Addressed This Visit    None    Visit Diagnoses    Dysphagia, unspecified type    -  Primary   Relevant Orders   Ambulatory referral to Gastroenterology   SLP modified barium swallow    con't pepcid  Cut food very small and chew well before swallowing  Will try to get into GI sooner than Oct  Swallow study  Follow-up: Return if symptoms worsen or fail to improve.  Ann Held, DO

## 2019-12-03 ENCOUNTER — Other Ambulatory Visit (HOSPITAL_COMMUNITY): Payer: Self-pay

## 2019-12-03 ENCOUNTER — Telehealth: Payer: Self-pay | Admitting: *Deleted

## 2019-12-03 DIAGNOSIS — R1310 Dysphagia, unspecified: Secondary | ICD-10-CM

## 2019-12-03 NOTE — Telephone Encounter (Signed)
Patient called - patient takes 200 mg of Imatinib (GLEEVEC) every day. She called yesterday asking if ok to dissolve in liquid to take as she has developed some issues with swallowing. [place], [facility] for information/directions: Per Ms. Fanning, For the 200 mg tablet she can dissolve it in either 200 mL of water or apple juice and stir it until disintegrated - once it's disintegrated she'll need to take it immediately. Contacted patient with this information  - she verbalized understanding. Monque Haggar contact this office if further concerns or questions.

## 2019-12-03 NOTE — Addendum Note (Signed)
Addended by: Roma Schanz R on: 12/03/2019 12:17 PM   Modules accepted: Orders

## 2019-12-03 NOTE — Telephone Encounter (Signed)
Patient has been rescheduled to 12/17/19.  Dr. Nonda Lou office notified

## 2019-12-04 DIAGNOSIS — R1312 Dysphagia, oropharyngeal phase: Secondary | ICD-10-CM | POA: Diagnosis not present

## 2019-12-04 DIAGNOSIS — C9210 Chronic myeloid leukemia, BCR/ABL-positive, not having achieved remission: Secondary | ICD-10-CM | POA: Diagnosis not present

## 2019-12-10 ENCOUNTER — Telehealth: Payer: Self-pay | Admitting: *Deleted

## 2019-12-10 DIAGNOSIS — R1314 Dysphagia, pharyngoesophageal phase: Secondary | ICD-10-CM | POA: Diagnosis not present

## 2019-12-10 NOTE — Telephone Encounter (Signed)
Patient having difficulty swallowing (ongoing evaluation) and having barium swallow test this Friday. Does she need to hold imatinib in regards to test? Also, has been dissolving the imatinib 200 mg capsule in apple juice (on advice of oral chemo pharm) as she cannot swallow the capsule due to size. She said it tastes absolutely horrible and wants to know if next prescription can be for 100 mg (smaller) tablets and she will take 2 each day. Dr.Kale informed. Contacted patient with Dr. Grier Mitts response/left voice mail: she can hold imatinib  the day of the barium swallow. Can prescribe 2 x 100mg  tabs if her insurance allow for that, she can discuss this with pharmacist and let office know. Encouraged to contact office for further questions

## 2019-12-12 ENCOUNTER — Encounter (HOSPITAL_COMMUNITY): Payer: BC Managed Care – PPO

## 2019-12-12 ENCOUNTER — Ambulatory Visit (HOSPITAL_COMMUNITY): Payer: BC Managed Care – PPO

## 2019-12-12 DIAGNOSIS — Z981 Arthrodesis status: Secondary | ICD-10-CM | POA: Diagnosis not present

## 2019-12-12 DIAGNOSIS — M47812 Spondylosis without myelopathy or radiculopathy, cervical region: Secondary | ICD-10-CM | POA: Diagnosis not present

## 2019-12-12 DIAGNOSIS — R1310 Dysphagia, unspecified: Secondary | ICD-10-CM | POA: Diagnosis not present

## 2019-12-12 DIAGNOSIS — K224 Dyskinesia of esophagus: Secondary | ICD-10-CM | POA: Diagnosis not present

## 2019-12-17 ENCOUNTER — Encounter: Payer: Self-pay | Admitting: Gastroenterology

## 2019-12-17 ENCOUNTER — Ambulatory Visit (INDEPENDENT_AMBULATORY_CARE_PROVIDER_SITE_OTHER): Payer: BC Managed Care – PPO | Admitting: Gastroenterology

## 2019-12-17 VITALS — BP 118/64 | HR 105 | Ht 66.0 in | Wt 185.0 lb

## 2019-12-17 DIAGNOSIS — R1310 Dysphagia, unspecified: Secondary | ICD-10-CM | POA: Diagnosis not present

## 2019-12-17 NOTE — Progress Notes (Signed)
History of Present Illness: This is a 49 year old female who notes the sudden onset of swallowing difficulties on August 26.  She states 5 days prior she had a COVID-19 booster shot with Pfizer vaccine.  She states she had fevers and achiness in her arm immediately following the injection.  She has had persistent problems with swallowing pills and certain foods and she localizes the symptoms to her neck.  She underwent speech pathology evaluation as below and barium esophagram as below.   Speech pathology evaluation 12/04/2019 Impressions: Patient presents with grossly normal oropharyngeal biomechanics. No penetration/aspiration or significant biomechanical deficits. At times she displays struggle and discomfort behavior when initiating a swallow, and intermittently brings bolus from the pharynx back into the oral cavity to re-swallow. There was one instance of backflow of puree material at the post cricoid space. She does have intermittent mild gagging, though no clear oropharyngeal triggers when this occurs outside of just attempting to initiate a swallow. There is intermittent trace post cricoid residue post swallow that is concerning for possible esophageal issues. Patient was able to continue intake with cues for relaxation of neck and shoulder tension and resistive breathing. Recommend that she continue a soft solid diet with thin liquids and adherence to swallowing strategies below. Pills should be crushed, and patient has already spoken with pharmacist about this. Given patient complaints and exam results, there is concern for possible esophageal dysphagia and/or reflux. Recommend short term follow up via esophagram at physician discretion. Discussed small frequent meals throughout the day, and supplementation with high calorie smoothies or products like Boost Plus/Ensure Plus. Provided handouts on soft solid diet and high calorie smoothie. Have sent puree diet via [facility]. Advised patient that  if symptoms worsen or hydration/nutrition status becomes further compromised over the weekend she will need to present to her local ED for management. There is no indication to actively follow for oropharyngeal swallowing. Available for reconsult as indicated. Patient verbalized understanding of all exam results and recommendations.   Recommendations: Diet: soft-solid, thin liquids Swallowing strategies: sit upright during all PO (as close to 90 degrees as possible), alternate liquids and puree/grounds/solids, sit upright 30 minutes after eating, practice resistive/diaphragmatic breathing Administer Medications: crushed, if permissible Other recommendations: concern for esophageal dysphagia, barium swallow/esophagram    BA esophagram 12/12/2019:   . Pharynx: Normal handling of bolus without reflux, laryngeal penetration or aspiration. No mass.  . Esophagus: Normal mucosa. No mass or stricture. Normal distensibility and contour. Slow clearance of contrast from the esophagus into the stomach likely due to delay of primary peristaltic waves.  . Gastroesophageal junction: Normal. No hiatal hernia or reflux.  . Additional comments: Patient unable to swallow the barium tablet. Status post C5-C7 ACDF with osseous fusion. Hardware is intact. Slight retrolisthesis at C4-C5 with associated degenerative changes at this level.   CONCLUSION:   1. Nonspecific esophageal dysmotility.  2. No structural abnormalities are visible.   Current Medications, Allergies, Past Medical History, Past Surgical History, Family History and Social History were reviewed in [employer] record.   Physical Exam: General: Well developed, well nourished, no acute distress Head: Normocephalic and atraumatic Eyes:  sclerae anicteric, EOMI Ears: Normal auditory acuity Mouth: Not examined, mask on during Covid-19 pandemic Lungs: Clear throughout to auscultation Heart: Regular rate and rhythm; no  murmurs, rubs or bruits Abdomen: Soft, non tender and non distended. No masses, hepatosplenomegaly or hernias noted. Normal Bowel sounds Rectal: Not done Musculoskeletal: Symmetrical with no gross deformities  Pulses:  Normal pulses noted Extremities: No clubbing, cyanosis, edema or deformities noted Neurological: Alert oriented x 4, grossly nonfocal Psychological:  Alert and cooperative. Normal mood and affect   Assessment and Recommendations:  1.  New onset dysphagia which appears to be primarily oropharyngeal based on findings noted in the swallowing study.  Nonspecific esophageal motility disorder was noted which could be contributing to her swallowing difficulties.  No reflux noted.  No hiatal hernia noted.  No strictures or other esophageal abnormalities noted.  Given the sudden onset of her swallowing symptoms neurologic and muscular disorders should be considered.  She is advised to follow recommendations provided by the speech pathologist and to return to her PCP for consideration of further evaluation. I offered EGD as an option to further evaluate her esophagus, stomach and proximal duodenum however I feel it is reasonable to defer this exam at this time and she prefers to defer at this time.  Esophageal manometry can be considered if her symptoms persist.

## 2019-12-17 NOTE — Patient Instructions (Signed)
Ongoing follow up with your primary care physician.   Thank you for choosing me and [facility].  Malcolm T. Stark, Jr., MD., FACG  

## 2019-12-18 ENCOUNTER — Telehealth (INDEPENDENT_AMBULATORY_CARE_PROVIDER_SITE_OTHER): Payer: BC Managed Care – PPO | Admitting: Family Medicine

## 2019-12-18 ENCOUNTER — Other Ambulatory Visit: Payer: Self-pay

## 2019-12-18 DIAGNOSIS — D8940 Mast cell activation, unspecified: Secondary | ICD-10-CM | POA: Diagnosis not present

## 2019-12-18 DIAGNOSIS — R748 Abnormal levels of other serum enzymes: Secondary | ICD-10-CM

## 2019-12-18 NOTE — Progress Notes (Signed)
[place] [facility] [address] [place],  [id number] [phone]  Date:  12/18/2019   Name:  Victoria Hall   DOB:  [birth year]   MRN:  [id number]  PCP:  Darreld Mclean, MD    Chief Complaint: No chief complaint on file.   History of Present Illness:  Victoria Hall is a 50 y.o. very pleasant female patient who presents with the following:  Last seen by myself in April of this year Patient with history of CML, POTS, mast cell activation syndrome, hypermobility, hemochromatosis, POTS, chronic fatigue Her oncologist is Dr. Irene Limbo  Virtual visit today to discuss swelling concern with swallowing Patient location is home, my location is office.  Patient identity confirmed 2 factors, she gives consent for virtual visit today.  The patient myself are present on the phone call  She got her covid 3rd dose on 8/20- got a booster at 6 months due to [facility] The next day she had a fever up to 101.4 which did not surprise her  She seemed to get generally back to normal, but on 8/26 she was eating dinner and suddenly found that she could not swallow- The patient stated she felt she forgotten how to swallow, she could not figure out what was going on with her swallowing function She was eventually able to complete her meal, but then had more episodes of the same situation  She saw my partner Dr Etter Sjogren on 8/31 She started to get scared as she was sometimes actually having to cough her food back up, it would seem to get stuck.  She could swallow thin liquids but solids or thick liquids were causing the problem She went to see Dr Carol Ada with ENT- he did not think this was an ENT issue, more gastro  She then saw DR Start who took another look at her swallowing study  In the interim, she saw Dr. Fuller Plan with gastroenterology- followed up with him yesterday: 1.  New onset dysphagia which appears to be primarily  oropharyngeal based on findings noted in the swallowing study.  Nonspecific esophageal motility disorder was noted which could be contributing to her swallowing difficulties.  No reflux noted.  No hiatal hernia noted.  No strictures or other esophageal abnormalities noted.  Given the sudden onset of her swallowing symptoms neurologic and muscular disorders should be considered.  She is advised to follow recommendations provided by the speech pathologist and to return to her PCP for consideration of further evaluation. I offered EGD as an option to further evaluate her esophagus, stomach and proximal duodenum however I feel it is reasonable to defer this exam at this time and she prefers to defer at this time.  Esophageal manometry can be considered if her symptoms persist.  For the time being she continues to have some difficulty swallowing thicker soft foods, and some of her pills.  Generally she is swallowing liquids without difficulty last night she was eating some soup, she developed some blisters on her lower lip- she has eaten this type of soup for years, nothing new to her  No blisters anywhere else on her body.  The blisters are now somewhat better, though not completely healed No vomiting  No pain with swallowing Bowels are moving normally No belly pain   Patient Active Problem List   Diagnosis Date Noted  . Flushing 08/08/2019  . CML (chronic myelocytic leukemia) ([place]) 06/03/2018  . Hemochromatosis 04/18/2018  .  Hypermobility syndrome 09/26/2017  . Mast cell activation syndrome ([place]) 09/26/2017  . POTS (postural orthostatic tachycardia syndrome) 08/10/2017  . Incomplete right bundle branch block 07/11/2017  . Fatigue 07/11/2017  . Low back pain 07/21/2016  . Right knee pain 06/05/2016  . Left shoulder pain 06/05/2016  . Hydrosalpinx 02/08/2016  . Hemorrhagic ovarian cyst 02/08/2016  . Right hip pain 02/03/2016  . HYDROSALPINX 02/07/2008  . HEADACHE 10/28/2007  . TACHYCARDIA  10/14/2007  . ANXIETY DISORDER 05/28/2007  . POLYARTHRITIS 05/09/2007  . UNSPECIFIED DISORDERS OF NERVOUS SYSTEM 04/22/2007  . ABDOMINAL PAIN RIGHT LOWER QUADRANT 04/08/2007  . PALPITATIONS, RECURRENT 11/07/2006  . PROBLEMS W/SMELL/TASTE 06/29/2006  . TOBACCO ABUSE 01/08/2006  . VISUAL IMPAIRMENT 01/08/2006  . DISTURBANCE, VISUAL NOS 01/08/2006  . FIBROCYSTIC BREAST DISEASE 01/08/2006  . [place] DISEASE, CERVICAL 01/08/2006  . VERTIGO 01/08/2006    Past Medical History:  Diagnosis Date  . Anxiety    Past hx- [facility] states due to Low BP   . Arthritis   . Asthma    early teens only   . Cervical intraepithelial neoplasia grade 3 2002   s/p LEEP Rx repeat pap negative  . Cervical radiculopathy   . CML (chronic myelocytic leukemia) ([place]) 06/03/2018  . Fallopian tube disorder    right fallopian tube removed  . Gallstones 09/2015   On CT  . Headache    Migraines  . Hemorrhagic ovarian cyst 02/08/2016  . [facility]drosalpinx    [facility]  . Hydrosalpinx 02/08/2016  . Interstitial cystitis   . Neurological abnormality    Neg workup with MRI/MRA in 2007 WNL except decreased caliber MCA proximally, distal cavernous portion of left LCA which may represent true stenosis or technique on exam. Evaluated by Dr Linus Salmons.  . Palpitations   . Partial seizures (HCC)    questionable diag  . Pneumonia    walking pneumonia  . Polycystic ovary    multiple ovarian cysts removed  . S/P cervical discectomy    Dr Vertell Limber, anterior discectomy C5-C7  . S/P epidural steroid injection    Right hip  . Seizures ([place]) 2005   simple partial seizure disorder-was mast cell problems per pt 09-15-2019- no seizures per pt     Past Surgical History:  Procedure Laterality Date  . APPENDECTOMY  1986  . BUNIONECTOMY Left    bunion removal  . CERVICAL DISC SURGERY  2005   C5-C7  . COLONOSCOPY    . core breast biopsy     . DENTAL SURGERY    . fallopian tue removed    . LAPAROSCOPIC BILATERAL  SALPINGECTOMY Left 02/09/2016   Procedure: LAPAROSCOPIC LEFT  SALPINGECTOMY;  Surgeon: Janyth Contes, MD;  Location: [facility];  Service: Gynecology;  Laterality: Left;  . LAPAROSCOPIC LYSIS OF ADHESIONS  02/09/2016   Procedure: LAPAROSCOPIC LYSIS OF ADHESIONS;  Surgeon: Janyth Contes, MD;  Location: [facility];  Service: Gynecology;;  momentum to adenexa  . LAPAROSCOPIC OVARIAN CYSTECTOMY Right 02/09/2016   Procedure: LAPAROSCOPIC OVARIAN CYSTECTOMY;  Surgeon: Janyth Contes, MD;  Location: [facility];  Service: Gynecology;  Laterality: Right;  x2 to right ovary  . LEEP  1998  . OOPHORECTOMY Left 02/09/2016   Procedure: LEFT OOPHORECTOMY;  Surgeon: Janyth Contes, MD;  Location: [facility];  Service: Gynecology;  Laterality: Left;- pt states left ovary has NOT been removed   . POLYPECTOMY    . TONSILLECTOMY  2001  . UPPER GASTROINTESTINAL ENDOSCOPY    . UPPER GI ENDOSCOPY  Social History   Tobacco Use  . Smoking status: Former Smoker    Packs/day: 0.00    Years: 0.00    Pack years: 0.00    Types: Cigarettes    Quit date: 11/02/2015    Years since quitting: 4.1  . Smokeless tobacco: Never Used  . Tobacco comment: currently smoking, began at beginning of June and plans to quit on June 14th. Has smoked off and on in the past.  Vaping Use  . Vaping Use: Every day  Substance Use Topics  . Alcohol use: Yes    Alcohol/week: 1.0 - 2.0 standard drink    Types: 1 - 2 Standard drinks or equivalent per week    Comment: occasionally 0-2 per day- rare   . Drug use: No    Family History  Adopted: Yes  Problem Relation Age of Onset  . COPD Mother   . Breast cancer Mother   . Other Mother        Teeth problems- all removed by age 23  . Rashes / Skin problems Sister   . Other Sister        Teeth problems- all removed by age 29  . Rashes / Skin problems Brother   . Asthma Brother   . Other Brother        Teeth problems- all removed by age 10  . Breast cancer Maternal Aunt    . Adrenal disorder Neg Hx   . Colon cancer Neg Hx   . Colon polyps Neg Hx   . Esophageal cancer Neg Hx   . Stomach cancer Neg Hx   . Rectal cancer Neg Hx     Allergies  Allergen Reactions  . Clindamycin/Lincomycin Itching and Other (See Comments)    Dizziness, trouble swallowing  . Codeine Itching and Nausea And Vomiting  . Cyclobenzaprine Other (See Comments)    Fast heartbeat, restless leg, nightmares  . Gabapentin Other (See Comments)    Headaches, visual disturbances.   . Nyquil Multi-Symptom [Pseudoeph-Doxylamine-Dm-Apap] Other (See Comments)    Heart racing, dizziness, weakness  . Uribel [Meth-Hyo-M Bl-Na Phos-Ph Sal] Other (See Comments)    Heart racing, dizziness, blurry vision.  . Xanax [Alprazolam]     Bruising  . Urelle Nausea And Vomiting    Medication list has been reviewed and updated.  Current Outpatient Medications on File Prior to Visit  Medication Sig Dispense Refill  . Ascorbic Acid (VITAMIN C) 100 MG tablet Take 100 mg by mouth daily.    . cholecalciferol (VITAMIN D3) 25 MCG (1000 UNIT) tablet Take 1,000 Units by mouth once a week.     . diazepam (VALIUM) 2 MG tablet Take 0.5-1 tablets (1-2 mg total) by mouth every 8 (eight) hours as needed for anxiety. 30 tablet 0  . famotidine (PEPCID) 20 MG tablet Take 10 mg by mouth daily.     Marland Kitchen imatinib (GLEEVEC) 400 MG tablet Take 0.5 tablets (200 mg total) by mouth daily. Take with meals and large glass of water.Caution:Chemotherapy 30 tablet 3  . loratadine (CLARITIN) 10 MG tablet Take 10 mg by mouth daily.      No current facility-administered medications on file prior to visit.    Review of Systems:  As per HPI- otherwise negative.   Physical Examination: There were no vitals filed for this visit. There were no vitals filed for this visit. There is no height or weight on file to calculate BMI. Ideal Body Weight:    Pt observed via video monitor- she  looks well and her normal self Her o2 sats are fine  at home Blood pressure was checked yesterday at GI  BP Readings from Last 3 Encounters:  12/17/19 118/64  12/02/19 110/80  09/29/19 113/71   Pulse Readings from Last 3 Encounters:  12/17/19 (!) 105  12/02/19 83  09/29/19 80    Assessment and Plan: Mast cell activation syndrome (HCC) - Plan: CBC with Differential/Platelet, Sedimentation rate, C-reactive protein, Tryptase  Elevated serum tryptase - Plan: Tryptase  Patient with a somewhat complex past medical history, here today with concern of change in swallowing function shortly after the third COVID-19 vaccine.  She does have a history of mast cell activation disorder, we wonder if inflammatory response may have been triggered by recent vaccination.  She would like to get some lab work-up which is reasonable, and then continue to follow her symptoms.  I am hopeful that her swallowing function will return to normal without any further intervention  Pt has a medrol dose pack on hand that she would like to take after her blood work is drawn -this is also reasonable She is asked to let me know if any changes or gets worse, or if any new concerns I will be in touch with her labs asap Signed Lamar Blinks, MD

## 2019-12-19 ENCOUNTER — Other Ambulatory Visit: Payer: BC Managed Care – PPO

## 2019-12-19 ENCOUNTER — Other Ambulatory Visit: Payer: Self-pay

## 2019-12-19 DIAGNOSIS — R748 Abnormal levels of other serum enzymes: Secondary | ICD-10-CM

## 2019-12-19 DIAGNOSIS — D8940 Mast cell activation, unspecified: Secondary | ICD-10-CM

## 2019-12-19 LAB — CBC WITH DIFFERENTIAL/PLATELET
Absolute Monocytes: 469 cells/uL (ref 200–950)
Basophils Absolute: 78 cells/uL (ref 0–200)
Basophils Relative: 1.1 %
Eosinophils Absolute: 362 cells/uL (ref 15–500)
Eosinophils Relative: 5.1 %
HCT: 40.4 % (ref 35.0–45.0)
Hemoglobin: 13.8 g/dL (ref 11.7–15.5)
Lymphs Abs: 2322 cells/uL (ref 850–3900)
MCH: 31 pg (ref 27.0–33.0)
MCHC: 34.2 g/dL (ref 32.0–36.0)
MCV: 90.8 fL (ref 80.0–100.0)
MPV: 10.9 fL (ref 7.5–12.5)
Monocytes Relative: 6.6 %
Neutro Abs: 3870 cells/uL (ref 1500–7800)
Neutrophils Relative %: 54.5 %
Platelets: 263 10*3/uL (ref 140–400)
RBC: 4.45 10*6/uL (ref 3.80–5.10)
RDW: 12.6 % (ref 11.0–15.0)
Total Lymphocyte: 32.7 %
WBC: 7.1 10*3/uL (ref 3.8–10.8)

## 2019-12-19 LAB — EXTRA SPECIMEN

## 2019-12-19 NOTE — Addendum Note (Signed)
Addended by: Kelle Darting A on: 12/19/2019 07:28 AM   Modules accepted: Orders

## 2019-12-22 ENCOUNTER — Telehealth: Payer: Self-pay

## 2019-12-22 LAB — TRYPTASE: Tryptase: 1.6 mcg/L (ref <11.0)

## 2019-12-22 NOTE — Telephone Encounter (Signed)
Left message to return call per PCP patient to come in for labs. Patient advised to call back to schedule. Lab orders in.

## 2019-12-25 ENCOUNTER — Ambulatory Visit: Payer: BC Managed Care – PPO | Admitting: Family Medicine

## 2019-12-25 ENCOUNTER — Telehealth: Payer: BC Managed Care – PPO | Admitting: Family Medicine

## 2019-12-25 ENCOUNTER — Telehealth: Payer: Self-pay | Admitting: Family Medicine

## 2019-12-25 NOTE — Telephone Encounter (Signed)
Sed rate/ CRP did not come back to chart. Will inquire with lab

## 2019-12-26 ENCOUNTER — Encounter: Payer: Self-pay | Admitting: Family Medicine

## 2019-12-26 DIAGNOSIS — D8940 Mast cell activation, unspecified: Secondary | ICD-10-CM

## 2020-01-07 ENCOUNTER — Other Ambulatory Visit: Payer: Self-pay

## 2020-01-07 ENCOUNTER — Inpatient Hospital Stay: Payer: BC Managed Care – PPO | Attending: Family

## 2020-01-07 DIAGNOSIS — C9210 Chronic myeloid leukemia, BCR/ABL-positive, not having achieved remission: Secondary | ICD-10-CM | POA: Diagnosis not present

## 2020-01-07 DIAGNOSIS — Z79899 Other long term (current) drug therapy: Secondary | ICD-10-CM | POA: Insufficient documentation

## 2020-01-07 DIAGNOSIS — Z87891 Personal history of nicotine dependence: Secondary | ICD-10-CM | POA: Diagnosis not present

## 2020-01-07 DIAGNOSIS — D8940 Mast cell activation, unspecified: Secondary | ICD-10-CM | POA: Diagnosis not present

## 2020-01-07 LAB — CBC WITH DIFFERENTIAL/PLATELET
Abs Immature Granulocytes: 0.02 10*3/uL (ref 0.00–0.07)
Basophils Absolute: 0.1 10*3/uL (ref 0.0–0.1)
Basophils Relative: 1 %
Eosinophils Absolute: 0.3 10*3/uL (ref 0.0–0.5)
Eosinophils Relative: 4 %
HCT: 40.8 % (ref 36.0–46.0)
Hemoglobin: 13.9 g/dL (ref 12.0–15.0)
Immature Granulocytes: 0 %
Lymphocytes Relative: 33 %
Lymphs Abs: 2.5 10*3/uL (ref 0.7–4.0)
MCH: 29.4 pg (ref 26.0–34.0)
MCHC: 34.1 g/dL (ref 30.0–36.0)
MCV: 86.4 fL (ref 80.0–100.0)
Monocytes Absolute: 0.6 10*3/uL (ref 0.1–1.0)
Monocytes Relative: 7 %
Neutro Abs: 4.2 10*3/uL (ref 1.7–7.7)
Neutrophils Relative %: 55 %
Platelets: 295 10*3/uL (ref 150–400)
RBC: 4.72 MIL/uL (ref 3.87–5.11)
RDW: 12.7 % (ref 11.5–15.5)
WBC: 7.6 10*3/uL (ref 4.0–10.5)
nRBC: 0 % (ref 0.0–0.2)

## 2020-01-07 LAB — CMP (CANCER CENTER ONLY)
ALT: 51 U/L — ABNORMAL HIGH (ref 0–44)
AST: 30 U/L (ref 15–41)
Albumin: 4.4 g/dL (ref 3.5–5.0)
Alkaline Phosphatase: 76 U/L (ref 38–126)
Anion gap: 6 (ref 5–15)
BUN: 12 mg/dL (ref 6–20)
CO2: 30 mmol/L (ref 22–32)
Calcium: 10.1 mg/dL (ref 8.9–10.3)
Chloride: 103 mmol/L (ref 98–111)
Creatinine: 0.98 mg/dL (ref 0.44–1.00)
GFR, Estimated: >60 mL/min (ref >60)
Glucose, Bld: 93 mg/dL (ref 70–99)
Potassium: 4.1 mmol/L (ref 3.5–5.1)
Sodium: 139 mmol/L (ref 135–145)
Total Bilirubin: 0.5 mg/dL (ref 0.3–1.2)
Total Protein: 7.8 g/dL (ref 6.5–8.1)

## 2020-01-07 LAB — VITAMIN D 25 HYDROXY (VIT D DEFICIENCY, FRACTURES): Vit D, 25-Hydroxy: 23.24 ng/mL — ABNORMAL LOW (ref 30–100)

## 2020-01-11 LAB — COPPER, SERUM: Copper: 89 ug/dL (ref 80–158)

## 2020-01-12 LAB — BCR/ABL

## 2020-01-14 ENCOUNTER — Ambulatory Visit: Payer: BC Managed Care – PPO | Admitting: Gastroenterology

## 2020-01-22 ENCOUNTER — Inpatient Hospital Stay (HOSPITAL_BASED_OUTPATIENT_CLINIC_OR_DEPARTMENT_OTHER): Payer: BC Managed Care – PPO | Admitting: Hematology

## 2020-01-22 DIAGNOSIS — C9210 Chronic myeloid leukemia, BCR/ABL-positive, not having achieved remission: Secondary | ICD-10-CM | POA: Diagnosis not present

## 2020-01-22 DIAGNOSIS — Z87891 Personal history of nicotine dependence: Secondary | ICD-10-CM | POA: Diagnosis not present

## 2020-01-22 DIAGNOSIS — Z79899 Other long term (current) drug therapy: Secondary | ICD-10-CM | POA: Diagnosis not present

## 2020-01-22 MED ORDER — IMATINIB MESYLATE 100 MG PO TABS: 100.0000 mg | ORAL_TABLET | Freq: Every day | ORAL | 3 refills | Status: DC

## 2020-01-22 NOTE — Progress Notes (Signed)
HEMATOLOGY/ONCOLOGY CLINIC NOTE  Date of Service: 01/22/2020  Patient Care Team: Copland, Gay Filler, MD as PCP - General (Family Medicine)  CHIEF COMPLAINTS/PURPOSE OF CONSULTATION:  Chronic Myeloid Leukemia  HISTORY OF PRESENTING ILLNESS:   Victoria Hall is a wonderful 49 y.o. female who has been referred to [place] by Dr. Silvestre Mesi for evaluation and management of CML. The pt was formerly under the care of my colleague Dr. Burney Gauze, and has transferred her care closer to home. The pt reports that she is doing well overall.  Prior to today's visit, the pt was recently diagnosed with CML after a 05/16/18 bone marrow biopsy revealed a translocation 9;22 and FISH study revealed BCR-ABL gene rearranagement in 85% of cells. The pt was prescribed 469m Imatinib with Dr. EMarin Olpon 06/03/18.  The pt reports that she had intermittent blurry vision for the last 4 years. She notes that she had night sweats for the last 20 years. She notes that her WBC have been elevated for the last 4 years. She notes that she has had elevated Basophils since April 2018. She has been evaluated for mast cell activation, and has had normal tryptase levels, including most recently on 06/03/18.  [facility]. The pt notes that she has "exercise intolerance," for the last 4 years, which she describes as needing to "lie on the couch for 2 weeks," after taking a run.  She has had intermittent rashes and hives for the last 20 years, which do resolve on their own. She keeps a food and activity journal for the last 2 years, and denies awareness of any associations with her rash onset. She has seen dermatology and immunology regarding this. The pt has also seen a rheumatologist for evaluation.  The pt notes that her hands and feet frequently fall asleep, and has been evaluated by neurology with an NCV. She notes that she has cervical  radiculopathy which she relates to her 2005 C5-7 fusion. She endorses exhaustion which "comes on out of no where." She notes her energy levels change "from minute to minute."   The pt denies abdominal pains or mouth sores.   The pt endorse frequent muscle and joint cramping as well. She has tried using CBD oil which she found to be helpful, but notes that the cost became prohibitive.  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  Most recent lab results (06/03/18) of CBC w/diff and CMP is as follows: all values are WNL except for WBC at 18.8k, ANC at 12.9k Basophils abs at 600, Abs immature granulocytes at 1.02k 06/24/18 LDH at 237  On review of systems, pt reports intermittent sudden-onset fatigue, intermittent headaches, intermittent hives and rashes, muscle and joint cramping, and denies abdominal pains, mouth sores, leg swelling, and any other symptoms.   On PMHx the pt reports chronic cervical radiculopathy with fusion of C5-7 in 2005. 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  Interval History:  I connected with  LArne Clevelandon 01/22/20 by telephone and verified that I am speaking with the correct person using two identifiers.   I discussed the limitations of evaluation and management by telemedicine. The patient expressed understanding and agreed to proceed.  Other persons participating in the visit  and their role in the encounter:        -Yevette Edwards, Medical Scribe  Patient's location: Home Provider's location: [name] [facility] [place] is a wonderful 49 y.o. female who is here for evaluation and management of CML. The patient's last visit with [place] was on 10/29/2019. The pt reports that she is doing well overall.  The pt reports that she was having  difficulty swallowing in late August/early September, which was thought to be caused by overreactive nerves. This has fortunately resolved. She notes that her mother and sibling are also experiencing muscle spasms and are considering speaking to a genetic counselor about a potential genetic condition. Pt has continued experiencing muscle cramps, shortness of breath, and fatigue that is limiting. She describes these symptoms as a 3-5 on a scale of 10.  Of note since the patient's last visit, pt has had BCR/ABL completed on 01/07/2020 with results revealing "Not Detected".  Lab results today (01/07/20) of CBC w/diff and CMP is as follows: all values are WNL except for ALT at 51. 01/07/2020 Vitamin D 25 (OH) at 23.24 01/07/2020 Copper at 89  On review of systems, pt reports chronic muscle spasms, fatigue, shortness of breath and denies dysphagia and any other symptoms.   MEDICAL HISTORY:  Past Medical History:  Diagnosis Date  . Anxiety    [facility] hx- [facility] due to Low BP   . Arthritis   . Asthma    early teens only   . Cervical intraepithelial neoplasia grade 3 2002   s/p LEEP Rx repeat pap negative  . Cervical radiculopathy   . CML (chronic myelocytic leukemia) ([place]) 06/03/2018  . Fallopian tube disorder    right fallopian tube removed  . Gallstones 09/2015   On CT  . Headache    Migraines  . Hemorrhagic ovarian cyst 02/08/2016  . Hydrosalpinx    [facility]  . Hydrosalpinx 02/08/2016  . Interstitial cystitis   . Neurological abnormality    Neg workup with MRI/MRA in 2007 WNL except decreased caliber MCA proximally, distal cavernous portion of left LCA which may represent true stenosis or technique on exam. Evaluated by Dr Linus Salmons.  . Palpitations   . Partial seizures (H[facility])    questionable diag  . Pneumonia    walking pneumonia  . Polycystic ovary    multiple ovarian cysts removed  . S/P cervical discectomy    Dr Vertell Limber, anterior discectomy C5-C7  . S/P  epidural steroid injection    Right hip  . Seizures ([place]) 2005   simple partial seizure disorder-was mast cell problems per pt 09-15-2019- no seizures per pt     SURGICAL HISTORY: Past Surgical History:  Procedure Laterality Date  . APPENDECTOMY  1986  . BUNIONECTOMY Left    bunion removal  . CERVICAL DISC SURGERY  2005   C5-C7  . COLONOSCOPY    . core breast biopsy     . DENTAL SURGERY    . fallopian tue removed    . LAPAROSCOPIC BILATERAL SALPINGECTOMY Left 02/09/2016   Procedure: LAPAROSCOPIC LEFT  SALPINGECTOMY;  Surgeon: Janyth Contes, MD;  Location: [facility];  Service: Gynecology;  Laterality: Left;  . LAPAROSCOPIC LYSIS OF ADHESIONS  02/09/2016   Procedure: LAPAROSCOPIC LYSIS OF ADHESIONS;  Surgeon: Janyth Contes, MD;  Location: [facility];  Service: Gynecology;;  momentum to adenexa  . LAPAROSCOPIC OVARIAN CYSTECTOMY Right 02/09/2016   Procedure: LAPAROSCOPIC OVARIAN CYSTECTOMY;  Surgeon: Janyth Contes, MD;  Location: [facility];  Service: Gynecology;  Laterality: Right;  x2 to right ovary  . LEEP  1998  . OOPHORECTOMY Left 02/09/2016   Procedure: LEFT OOPHORECTOMY;  Surgeon: Janyth Contes, MD;  Location: [facility];  Service: Gynecology;  Laterality: Left;- pt states left ovary has NOT been removed   . POLYPECTOMY    . TONSILLECTOMY  2001  . UPPER GASTROINTESTINAL ENDOSCOPY    . UPPER GI ENDOSCOPY      SOCIAL HISTORY: Social History   Socioeconomic History  . Marital status: Married    Spouse name: Not on file  . Number of children: 0  . Years of education: Not on file  . Highest education level: Not on file  Occupational History  . 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  . Occupation: Pharmacist, hospital  . Occupation: Curator  Tobacco Use  . Smoking status: Former Smoker    Packs/day: 0.00    Years: 0.00    Pack years: 0.00    Types: Cigarettes    Quit date: 11/02/2015    Years since quitting: 4.2   . Smokeless tobacco: Never Used  . Tobacco comment: currently smoking, began at beginning of June and plans to quit on June 14th. Has smoked off and on in the past.  Vaping Use  . Vaping Use: Every day  Substance and Sexual Activity  . Alcohol use: Yes    Alcohol/week: 1.0 - 2.0 standard drink    Types: 1 - 2 Standard drinks or equivalent per week    Comment: occasionally 0-2 per day- rare   . Drug use: No  . Sexual activity: Yes  Other Topics Concern  . Not on file  Social History Narrative  . Not on file   Social Determinants of Health   Financial Resource Strain:   . Difficulty of Paying Living Expenses: Not on file  Food Insecurity:   . Worried About Charity fundraiser in the Last Year: Not on file  . Ran Out of Food in the Last Year: Not on file  Transportation Needs:   . Lack of Transportation (Medical): Not on file  . Lack of Transportation (Non-Medical): Not on file  Physical Activity:   . Days of Exercise per Week: Not on file  . Minutes of Exercise per Session: Not on file  Stress:   . Feeling of Stress : Not on file  Social Connections:   . Frequency of Communication with Friends and Family: Not on file  . Frequency of Social Gatherings with Friends and Family: Not on file  . Attends Religious Services: Not on file  . Active Member of Clubs or Organizations: Not on file  . Attends Archivist Meetings: Not on file  . Marital Status: Not on file  Intimate Partner Violence:   . Fear of Current or Ex-Partner: Not on file  . Emotionally Abused: Not on file  . Physically Abused: Not on file  . Sexually Abused: Not on file    FAMILY HISTORY: Family History  Adopted: Yes  Problem Relation Age of Onset  . COPD Mother   . Breast cancer Mother   . Other Mother        Teeth problems- all removed by age 67  . Rashes / Skin problems Sister   . Other Sister        Teeth problems- all removed by age 6  . Rashes / Skin problems Brother   . Asthma  Brother   . Other Brother        Teeth problems- all removed by age 71  . Breast cancer Maternal Aunt   . Adrenal disorder Neg Hx   . Colon cancer Neg Hx   . Colon polyps Neg Hx   . Esophageal cancer Neg Hx   . Stomach cancer Neg Hx   . Rectal cancer Neg Hx     ALLERGIES:  is allergic to clindamycin/lincomycin, codeine, cyclobenzaprine, gabapentin, nyquil multi-symptom [pseudoeph-doxylamine-dm-apap], uribel [meth-hyo-m bl-na phos-ph sal], xanax [alprazolam], and urelle.  MEDICATIONS:  Current Outpatient Medications  Medication Sig Dispense Refill  . Ascorbic Acid (VITAMIN C) 100 MG tablet Take 100 mg by mouth daily.    . cholecalciferol (VITAMIN D3) 25 MCG (1000 UNIT) tablet Take 1,000 Units by mouth once a week.     . diazepam (VALIUM) 2 MG tablet Take 0.5-1 tablets (1-2 mg total) by mouth every 8 (eight) hours as needed for anxiety. 30 tablet 0  . famotidine (PEPCID) 20 MG tablet Take 10 mg by mouth daily.     Marland Kitchen imatinib (GLEEVEC) 400 MG tablet Take 0.5 tablets (200 mg total) by mouth daily. Take with meals and large glass of water.Caution:Chemotherapy 30 tablet 3  . loratadine (CLARITIN) 10 MG tablet Take 10 mg by mouth daily.      No current facility-administered medications for this visit.    REVIEW OF SYSTEMS:   A 10+ POINT REVIEW OF SYSTEMS WAS OBTAINED including neurology, dermatology, psychiatry, cardiac, respiratory, lymph, extremities, GI, GU, Musculoskeletal, constitutional, breasts, reproductive, HEENT.  All pertinent positives are noted in the HPI.  All others are negative.   PHYSICAL EXAMINATION: There were no vitals filed for this visit. Wt Readings from Last 3 Encounters:  12/17/19 185 lb (83.9 kg)  12/02/19 187 lb (84.8 kg)  09/29/19 186 lb (84.4 kg)   There is no height or weight on file to calculate BMI.    Telehealth visit 01/22/2020  LABORATORY DATA:  I have reviewed the data as listed  . CBC Latest Ref Rng & Units 01/07/2020 12/19/2019 10/15/2019  WBC  4.0 - 10.5 K/uL 7.6 7.1 6.5  Hemoglobin 12.0 - 15.0 g/dL 13.9 13.8 13.8  Hematocrit 36 - 46 % 40.8 40.4 40.2  Platelets 150 - 400 K/uL 295 263 281   . CBC    Component Value Date/Time   WBC 7.6 01/07/2020 1151   RBC 4.72 01/07/2020 1151   HGB 13.9 01/07/2020 1151   HGB 12.5 01/16/2019 1402   HGB 15.1 03/26/2017 1101   HCT 40.8 01/07/2020 1151   HCT 44.0 03/26/2017 1101   PLT 295 01/07/2020 1151   PLT 259 01/16/2019 1402   PLT 330 03/26/2017 1101   MCV 86.4 01/07/2020 1151   MCV 84 03/26/2017 1101   MCH 29.4 01/07/2020 1151   MCHC 34.1 01/07/2020 1151   RDW 12.7 01/07/2020 1151   RDW 14.0 03/26/2017 1101   LYMPHSABS 2.5 01/07/2020 1151   LYMPHSABS 2.8 03/26/2017 1101   MONOABS 0.6 01/07/2020 1151   EOSABS 0.3 01/07/2020 1151   EOSABS 0.4 03/26/2017 1101   BASOSABS 0.1 01/07/2020 1151   BASOSABS 0.2 03/26/2017 1101     . CMP Latest Ref Rng & Units 01/07/2020 10/15/2019 07/22/2019  Glucose 70 - 99 mg/dL 93 94 97  BUN 6 - 20 mg/dL _0 Creatinine 0.44 - 1.00 mg/dL 0.98 1.02(H) 0.96  Sodium 135 - 145 mmol/L 139 139 138  Potassium 3.5 - 5.1 mmol/L 4.1  4.5 4.4  Chloride 98 - 111 mmol/L 103 107 105  CO2 22 - 32 mmol/L _0 Calcium 8.9 - 10.3 mg/dL 10.1 9.7 9.4  Total Protein 6.5 - 8.1 g/dL 7.8 7.0 7.1  Total Bilirubin 0.3 - 1.2 mg/dL 0.5 0.4 0.5  Alkaline Phos 38 - 126 U/L 76 66 69  AST 15 - 41 U/L _1 ALT 0 - 44 U/L 51(H) 43 27   01/07/2020 BCR/ABL:   10/15/2019 BCR/ABL:    05/21/2019 BCR/ABL:    03/26/2019 BCR/ABL:    01/28/2019 FISH:   07/25/18 Initial BCR-ABL:    06/03/18 FISH:   05/16/18 Cytogenetics:   04/10/18 Hemochromatosis Panel:    RADIOGRAPHIC STUDIES: I have personally reviewed the radiological images as listed and agreed with the findings in the report. No results found.  01/31/18 ECHO Transthoracic:    ASSESSMENT & PLAN:   49 y.o. female with  1. CML- Chronic phase 05/16/18 BM Cytogenetics revealed translocation  9;22. 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  -Discussed 01/07/2020 BCR/ABL is Undetectable - No lab or clinical evidence of CML progression at this time.  -Advised pt that we try not to drop below 50% of standard dose if our goal is to maintain undetectable levels and eventually hold Imatinib. -Advised pt that if we continue to reduce the dose and BCR ABL undetectable status is lost it may take longer to hold Imatinib, if at all possible. -Advised pt that the standard course of treatment is to switch TKI as opposed to further reducing the dose of Imatinib if symptoms are too bothersome - Pt would prefer not to switch TKIs due to concern other risk of  toxicity.  -The pt has no prohibitive toxicities from continuing 100 mg Imatinib at this time. -Continue 2 mg Copper on M,W,F.  -Will see back in 2 months, with labs 2 weeks prior.   FOLLOW UP: Labs in 6 weeks Phone visit with Dr Irene Limbo in 8 weeks   The total time spent in the appt was 25 minutes and more than 50% was on counseling and direct patient cares.  All of the patient's questions were answered with apparent satisfaction. The patient knows to call the clinic with any problems, questions or concerns.   Sullivan Lone MD [place] [facility] Hematology/Oncology Physician [facility]  (Office):        [phone] (Work cell):  [phone] (Fax):           [phone]  01/22/2020 7:43 AM  I, Yevette Edwards, am acting as a scribe for Dr. Sullivan Lone.   .I have reviewed the above documentation for accuracy and completeness, and I agree with the above. Brunetta Genera MD

## 2020-01-29 ENCOUNTER — Telehealth: Payer: Self-pay | Admitting: *Deleted

## 2020-01-29 NOTE — Telephone Encounter (Signed)
-----   Message from Brunetta Genera, MD sent at 01/28/2020 11:01 PM EDT ----- Plz inform patient her vit d level are  a little low-- increase vit d to 2000 units po daily

## 2020-01-29 NOTE — Telephone Encounter (Signed)
Patient verbalized understanding of trest results and said she will adjust current vit D intake accordingly. States she was not on Vitamin D at the time the test was done. She has since resumed taking it.  Medication list updated.   Patient asked that Dr. Irene Limbo be informed that with decrease in Imatinib she has less muscle pain, but increased joint pain. Shortness of breath has improved. Dr.Kale informed.

## 2020-02-23 DIAGNOSIS — F0634 Mood disorder due to known physiological condition with mixed features: Secondary | ICD-10-CM | POA: Diagnosis not present

## 2020-03-04 ENCOUNTER — Inpatient Hospital Stay: Payer: BC Managed Care – PPO | Attending: Family

## 2020-03-04 ENCOUNTER — Other Ambulatory Visit: Payer: Self-pay

## 2020-03-04 DIAGNOSIS — C9210 Chronic myeloid leukemia, BCR/ABL-positive, not having achieved remission: Secondary | ICD-10-CM | POA: Diagnosis not present

## 2020-03-04 LAB — CMP (CANCER CENTER ONLY)
ALT: 42 U/L (ref 0–44)
AST: 25 U/L (ref 15–41)
Albumin: 3.9 g/dL (ref 3.5–5.0)
Alkaline Phosphatase: 65 U/L (ref 38–126)
Anion gap: 7 (ref 5–15)
BUN: 10 mg/dL (ref 6–20)
CO2: 27 mmol/L (ref 22–32)
Calcium: 9.2 mg/dL (ref 8.9–10.3)
Chloride: 106 mmol/L (ref 98–111)
Creatinine: 1.02 mg/dL — ABNORMAL HIGH (ref 0.44–1.00)
GFR, Estimated: >60 mL/min (ref >60)
Glucose, Bld: 104 mg/dL — ABNORMAL HIGH (ref 70–99)
Potassium: 3.8 mmol/L (ref 3.5–5.1)
Sodium: 140 mmol/L (ref 135–145)
Total Bilirubin: 0.6 mg/dL (ref 0.3–1.2)
Total Protein: 6.7 g/dL (ref 6.5–8.1)

## 2020-03-04 LAB — CBC WITH DIFFERENTIAL/PLATELET
Abs Immature Granulocytes: 0.02 10*3/uL (ref 0.00–0.07)
Basophils Absolute: 0.1 10*3/uL (ref 0.0–0.1)
Basophils Relative: 1 %
Eosinophils Absolute: 0.3 10*3/uL (ref 0.0–0.5)
Eosinophils Relative: 5 %
HCT: 39.4 % (ref 36.0–46.0)
Hemoglobin: 13.5 g/dL (ref 12.0–15.0)
Immature Granulocytes: 0 %
Lymphocytes Relative: 33 %
Lymphs Abs: 2.1 10*3/uL (ref 0.7–4.0)
MCH: 30.1 pg (ref 26.0–34.0)
MCHC: 34.3 g/dL (ref 30.0–36.0)
MCV: 87.9 fL (ref 80.0–100.0)
Monocytes Absolute: 0.5 10*3/uL (ref 0.1–1.0)
Monocytes Relative: 7 %
Neutro Abs: 3.5 10*3/uL (ref 1.7–7.7)
Neutrophils Relative %: 54 %
Platelets: 284 10*3/uL (ref 150–400)
RBC: 4.48 MIL/uL (ref 3.87–5.11)
RDW: 12.8 % (ref 11.5–15.5)
WBC: 6.5 10*3/uL (ref 4.0–10.5)
nRBC: 0 % (ref 0.0–0.2)

## 2020-03-09 ENCOUNTER — Other Ambulatory Visit: Payer: Self-pay | Admitting: Surgery

## 2020-03-09 DIAGNOSIS — N6091 Unspecified benign mammary dysplasia of right breast: Secondary | ICD-10-CM

## 2020-03-11 ENCOUNTER — Encounter: Payer: Self-pay | Admitting: Family Medicine

## 2020-03-11 DIAGNOSIS — D8940 Mast cell activation, unspecified: Secondary | ICD-10-CM

## 2020-03-11 DIAGNOSIS — M542 Cervicalgia: Secondary | ICD-10-CM

## 2020-03-11 LAB — BCR/ABL

## 2020-03-12 ENCOUNTER — Other Ambulatory Visit: Payer: Self-pay

## 2020-03-12 ENCOUNTER — Encounter: Payer: Self-pay | Admitting: Family Medicine

## 2020-03-12 ENCOUNTER — Other Ambulatory Visit (INDEPENDENT_AMBULATORY_CARE_PROVIDER_SITE_OTHER): Payer: BC Managed Care – PPO

## 2020-03-12 ENCOUNTER — Ambulatory Visit (HOSPITAL_BASED_OUTPATIENT_CLINIC_OR_DEPARTMENT_OTHER)
Admission: RE | Admit: 2020-03-12 | Discharge: 2020-03-12 | Disposition: A | Discharge status: Home / Self Care | Payer: BC Managed Care – PPO | Source: Ambulatory Visit | Attending: Family Medicine | Admitting: Family Medicine

## 2020-03-12 DIAGNOSIS — M542 Cervicalgia: Secondary | ICD-10-CM

## 2020-03-12 DIAGNOSIS — D8940 Mast cell activation, unspecified: Secondary | ICD-10-CM

## 2020-03-12 DIAGNOSIS — M47812 Spondylosis without myelopathy or radiculopathy, cervical region: Secondary | ICD-10-CM | POA: Diagnosis not present

## 2020-03-12 DIAGNOSIS — M4322 Fusion of spine, cervical region: Secondary | ICD-10-CM | POA: Diagnosis not present

## 2020-03-12 DIAGNOSIS — Z981 Arthrodesis status: Secondary | ICD-10-CM | POA: Diagnosis not present

## 2020-03-12 LAB — CK: Total CK: 133 U/L (ref 7–177)

## 2020-03-13 ENCOUNTER — Encounter: Payer: Self-pay | Admitting: Family Medicine

## 2020-03-13 LAB — LACTATE DEHYDROGENASE: LDH: 214 U/L (ref 120–250)

## 2020-03-17 ENCOUNTER — Ambulatory Visit: Admission: RE | Admit: 2020-03-17 | Payer: BC Managed Care – PPO | Source: Ambulatory Visit

## 2020-03-17 ENCOUNTER — Other Ambulatory Visit: Payer: Self-pay

## 2020-03-17 ENCOUNTER — Other Ambulatory Visit: Payer: Self-pay | Admitting: Surgery

## 2020-03-17 ENCOUNTER — Ambulatory Visit
Admission: RE | Admit: 2020-03-17 | Discharge: 2020-03-17 | Disposition: A | Discharge status: Home / Self Care | Payer: BC Managed Care – PPO | Source: Ambulatory Visit | Attending: Surgery | Admitting: Surgery

## 2020-03-17 DIAGNOSIS — N6091 Unspecified benign mammary dysplasia of right breast: Secondary | ICD-10-CM

## 2020-03-17 DIAGNOSIS — R921 Mammographic calcification found on diagnostic imaging of breast: Secondary | ICD-10-CM | POA: Diagnosis not present

## 2020-03-18 ENCOUNTER — Inpatient Hospital Stay (HOSPITAL_BASED_OUTPATIENT_CLINIC_OR_DEPARTMENT_OTHER): Payer: BC Managed Care – PPO | Admitting: Hematology

## 2020-03-18 DIAGNOSIS — C9210 Chronic myeloid leukemia, BCR/ABL-positive, not having achieved remission: Secondary | ICD-10-CM | POA: Diagnosis not present

## 2020-03-18 NOTE — Progress Notes (Signed)
HEMATOLOGY/ONCOLOGY CLINIC NOTE  Date of Service: 03/18/2020  Patient Care Team: Copland, Gay Filler, MD as PCP - General (Family Medicine)  CHIEF COMPLAINTS/PURPOSE OF CONSULTATION:  Chronic Myeloid Leukemia  HISTORY OF PRESENTING ILLNESS:   Victoria Hall is a wonderful 49 y.o. female who has been referred to [place] by Dr. Silvestre Mesi for evaluation and management of CML. The pt was formerly under the care of my colleague Dr. Burney Gauze, and has transferred her care closer to home. The pt reports that she is doing well overall.  Prior to today's visit, the pt was recently diagnosed with CML after a 05/16/18 bone marrow biopsy revealed a translocation 9;22 and FISH study revealed BCR-ABL gene rearranagement in 85% of cells. The pt was prescribed $RemoveBefore'400mg'WmMkcVUbvtVAF$  Imatinib with Dr. Marin Olp on 06/03/18.  The pt reports that she had intermittent blurry vision for the last 4 years. She notes that she had night sweats for the last 20 years. She notes that her WBC have been elevated for the last 4 years. She notes that she has had elevated Basophils since April 2018. She has been evaluated for mast cell activation, and has had normal tryptase levels, including most recently on 06/03/18.  She reports that she went to the [facility] in October 2019 for suspicions of dysautonomia. The pt notes that she has "exercise intolerance," for the last 4 years, which she describes as needing to "lie on the couch for 2 weeks," after taking a run.  She has had intermittent rashes and hives for the last 20 years, which do resolve on their own. She keeps a food and activity journal for the last 2 years, and denies awareness of any associations with her rash onset. She has seen dermatology and immunology regarding this. The pt has also seen a rheumatologist for evaluation.  The pt notes that her hands and feet frequently fall asleep, and has been evaluated by neurology with an NCV. She notes that she has cervical  radiculopathy which she relates to her 2005 C5-7 fusion. She endorses exhaustion which "comes on out of no where." She notes her energy levels change "from minute to minute."   The pt denies abdominal pains or mouth sores.   The pt endorse frequent muscle and joint cramping as well. She has tried using CBD oil which she found to be helpful, but notes that the cost became prohibitive.  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  Most recent lab results (06/03/18) of CBC w/diff and CMP is as follows: all values are WNL except for WBC at 18.8k, ANC at 12.9k Basophils abs at 600, Abs immature granulocytes at 1.02k 06/24/18 LDH at 237  On review of systems, pt reports intermittent sudden-onset fatigue, intermittent headaches, intermittent hives and rashes, muscle and joint cramping, and denies abdominal pains, mouth sores, leg swelling, and any other symptoms.   On PMHx the pt reports chronic cervical radiculopathy with fusion of C5-7 in 2005. 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 She endorses concern for chemical exposure related to her hobbies of painting and furniture restoration.  Interval History:  I connected with  Victoria Hall on 03/18/20 by telephone and verified that I am speaking with the correct person using two identifiers.   I discussed the limitations of evaluation and management by telemedicine. The patient expressed understanding and agreed to proceed.  Other persons participating in the visit  and their role in the encounter:         -Yevette Edwards, Medical Scribe  Patient's location: Home Provider's location: [place] [facility] [place] is a wonderful 49 y.o. female who is here for evaluation and management of CML. The patient's last visit with [place] was on 01/22/2020. The pt reports that she is doing well overall.  The pt reports that she had improvement in  muscle cramps, energy, tinnitus, mental focus and shortness of breath after dropping down to 100 mg Imatinib. A few days later pt began to experience joint pain, rash, flushing, and diarrhea. The joint pain was significant and limiting her mobility. On 02/15/20 pt increased her dose to 150 mg Imatinib and re-experienced muscle cramps, tinnitus, shortness of breath, and abdominal fullness. She restarted 200 mg Imatinib last night because of significant joint pain remaining at 150 mg.   Pt saw a physician at [facility] who thought that she potentially had a Mast Cell disorder. They recommended pt follow up with an Allergist, who felt that the pt's symptoms are exclusively caused by her leukemia. Pt is planning to see a Mast Cell Specialist.   Lab results (03/04/20) of CBC w/diff and CMP is as follows: all values are WNL except for Glucose at 104, Creatinine at 1.02. 03/04/2020 BCR/ABL at 0.0129%, MMR: Yes  On review of systems, pt reports muscle cramps, tinnitus, SOB, abdominal fullness, fogginess and denies leg swelling and any other symptoms.    MEDICAL HISTORY:  Past Medical History:  Diagnosis Date  . Anxiety    Past hx- [facility] due to Low BP   . Arthritis   . Asthma    early teens only   . Cervical intraepithelial neoplasia grade 3 2002   s/p LEEP Rx repeat pap negative  . Cervical radiculopathy   . CML (chronic myelocytic leukemia) ([place]) 06/03/2018  . Fallopian tube disorder    right fallopian tube removed  . Gallstones 09/2015   On CT  . Headache    Migraines  . Hemorrhagic ovarian cyst 02/08/2016  . Hydrosalpinx    [facility]  . Hydrosalpinx 02/08/2016  . Interstitial cystitis   . Neurological abnormality    Neg workup with MRI/MRA in 2007 WNL except decreased caliber MCA proximally, distal cavernous portion of left LCA which may represent true stenosis or technique on exam. Evaluated by Dr Linus Salmons.  . Palpitations   . Partial seizures (HCC)     questionable diag  . Pneumonia    walking pneumonia  . Polycystic ovary    multiple ovarian cysts removed  . S/P cervical discectomy    Dr Vertell Limber, anterior discectomy C5-C7  . S/P epidural steroid injection    Right hip  . Seizures ([place]) 2005   simple partial seizure disorder-was mast cell problems per pt 09-15-2019- no seizures per pt     SURGICAL HISTORY: Past Surgical History:  Procedure Laterality Date  . APPENDECTOMY  1986  . BREAST BIOPSY Right 09/2019  . BUNIONECTOMY Left    bunion removal  . CERVICAL DISC SURGERY  2005   C5-C7  . COLONOSCOPY    . core breast biopsy     . DENTAL SURGERY    . fallopian tue removed    . LAPAROSCOPIC BILATERAL SALPINGECTOMY Left 02/09/2016   Procedure: LAPAROSCOPIC LEFT  SALPINGECTOMY;  Surgeon: Janyth Contes, MD;  Location: [facility];  Service: Gynecology;  Laterality: Left;  . LAPAROSCOPIC LYSIS OF ADHESIONS  02/09/2016  Procedure: LAPAROSCOPIC LYSIS OF ADHESIONS;  Surgeon: Janyth Contes, MD;  Location: [facility];  Service: Gynecology;;  momentum to adenexa  . LAPAROSCOPIC OVARIAN CYSTECTOMY Right 02/09/2016   Procedure: LAPAROSCOPIC OVARIAN CYSTECTOMY;  Surgeon: Janyth Contes, MD;  Location: [facility];  Service: Gynecology;  Laterality: Right;  x2 to right ovary  . LEEP  1998  . OOPHORECTOMY Left 02/09/2016   Procedure: LEFT OOPHORECTOMY;  Surgeon: Janyth Contes, MD;  Location: [facility];  Service: Gynecology;  Laterality: Left;- pt states left ovary has NOT been removed   . POLYPECTOMY    . TONSILLECTOMY  2001  . UPPER GASTROINTESTINAL ENDOSCOPY    . UPPER GI ENDOSCOPY      SOCIAL HISTORY: Social History   Socioeconomic History  . Marital status: Married    Spouse name: Not on file  . Number of children: 0  . Years of education: Not on file  . Highest education level: Not on file  Occupational History  . 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  .  Occupation: Pharmacist, hospital  . Occupation: Curator  Tobacco Use  . Smoking status: Former Smoker    Packs/day: 0.00    Years: 0.00    Pack years: 0.00    Types: Cigarettes    Quit date: 11/02/2015    Years since quitting: 4.3  . Smokeless tobacco: Never Used  . Tobacco comment: currently smoking, began at beginning of June and plans to quit on June 14th. Has smoked off and on in the past.  Vaping Use  . Vaping Use: Every day  Substance and Sexual Activity  . Alcohol use: Yes    Alcohol/week: 1.0 - 2.0 standard drink    Types: 1 - 2 Standard drinks or equivalent per week    Comment: occasionally 0-2 per day- rare   . Drug use: No  . Sexual activity: Yes  Other Topics Concern  . Not on file  Social History Narrative  . Not on file   Social Determinants of Health   Financial Resource Strain: Not on file  Food Insecurity: Not on file  Transportation Needs: Not on file  Physical Activity: Not on file  Stress: Not on file  Social Connections: Not on file  Intimate Partner Violence: Not on file    FAMILY HISTORY: Family History  Adopted: Yes  Problem Relation Age of Onset  . COPD Mother   . Breast cancer Mother   . Other Mother        Teeth problems- all removed by age 15  . Rashes / Skin problems Sister   . Other Sister        Teeth problems- all removed by age 38  . Rashes / Skin problems Brother   . Asthma Brother   . Other Brother        Teeth problems- all removed by age 74  . Breast cancer Maternal Aunt   . Adrenal disorder Neg Hx   . Colon cancer Neg Hx   . Colon polyps Neg Hx   . Esophageal cancer Neg Hx   . Stomach cancer Neg Hx   . Rectal cancer Neg Hx     ALLERGIES:  is allergic to clindamycin/lincomycin, codeine, cyclobenzaprine, gabapentin, nyquil multi-symptom [pseudoeph-doxylamine-dm-apap], uribel [meth-hyo-m bl-na phos-ph sal], xanax [alprazolam], and urelle.  MEDICATIONS:  Current Outpatient Medications  Medication Sig Dispense Refill  . Ascorbic Acid  (VITAMIN C) 100 MG tablet Take 100 mg by mouth daily.    Marland Kitchen  cholecalciferol (VITAMIN D3) 25 MCG (1000 UNIT) tablet Take 2,000 Units by mouth once a week.    . diazepam (VALIUM) 2 MG tablet Take 0.5-1 tablets (1-2 mg total) by mouth every 8 (eight) hours as needed for anxiety. 30 tablet 0  . famotidine (PEPCID) 20 MG tablet Take 10 mg by mouth daily.     Marland Kitchen imatinib (GLEEVEC) 100 MG tablet Take 1 tablet (100 mg total) by mouth daily. Take with meals and large glass of water.Caution:Chemotherapy 30 tablet 3  . loratadine (CLARITIN) 10 MG tablet Take 10 mg by mouth daily.      No current facility-administered medications for this visit.    REVIEW OF SYSTEMS:   A 10+ POINT REVIEW OF SYSTEMS WAS OBTAINED including neurology, dermatology, psychiatry, cardiac, respiratory, lymph, extremities, GI, GU, Musculoskeletal, constitutional, breasts, reproductive, HEENT.  All pertinent positives are noted in the HPI.  All others are negative.   PHYSICAL EXAMINATION: There were no vitals filed for this visit. Wt Readings from Last 3 Encounters:  12/17/19 185 lb (83.9 kg)  12/02/19 187 lb (84.8 kg)  09/29/19 186 lb (84.4 kg)   There is no height or weight on file to calculate BMI.    Telehealth visit 03/18/2020  LABORATORY DATA:  I have reviewed the data as listed  . CBC Latest Ref Rng & Units 03/04/2020 01/07/2020 12/19/2019  WBC 4.0 - 10.5 K/uL 6.5 7.6 7.1  Hemoglobin 12.0 - 15.0 g/dL 13.5 13.9 13.8  Hematocrit 36.0 - 46.0 % 39.4 40.8 40.4  Platelets 150 - 400 K/uL 284 295 263   . CBC    Component Value Date/Time   WBC 6.5 03/04/2020 0934   RBC 4.48 03/04/2020 0934   HGB 13.5 03/04/2020 0934   HGB 12.5 01/16/2019 1402   HGB 15.1 03/26/2017 1101   HCT 39.4 03/04/2020 0934   HCT 44.0 03/26/2017 1101   PLT 284 03/04/2020 0934   PLT 259 01/16/2019 1402   PLT 330 03/26/2017 1101   MCV 87.9 03/04/2020 0934   MCV 84 03/26/2017 1101   MCH 30.1 03/04/2020 0934   MCHC 34.3 03/04/2020 0934   RDW  12.8 03/04/2020 0934   RDW 14.0 03/26/2017 1101   LYMPHSABS 2.1 03/04/2020 0934   LYMPHSABS 2.8 03/26/2017 1101   MONOABS 0.5 03/04/2020 0934   EOSABS 0.3 03/04/2020 0934   EOSABS 0.4 03/26/2017 1101   BASOSABS 0.1 03/04/2020 0934   BASOSABS 0.2 03/26/2017 1101     . CMP Latest Ref Rng & Units 03/04/2020 01/07/2020 10/15/2019  Glucose 70 - 99 mg/dL 104(H) 93 94  BUN 6 - 20 mg/dL $Remove'10 12 10  'ohwdhJz$ Creatinine 0.44 - 1.00 mg/dL 1.02(H) 0.98 1.02(H)  Sodium 135 - 145 mmol/L 140 139 139  Potassium 3.5 - 5.1 mmol/L 3.8 4.1 4.5  Chloride 98 - 111 mmol/L 106 103 107  CO2 22 - 32 mmol/L $RemoveB'27 30 28  'RKrRHBGq$ Calcium 8.9 - 10.3 mg/dL 9.2 10.1 9.7  Total Protein 6.5 - 8.1 g/dL 6.7 7.8 7.0  Total Bilirubin 0.3 - 1.2 mg/dL 0.6 0.5 0.4  Alkaline Phos 38 - 126 U/L 65 76 66  AST 15 - 41 U/L $Remo'25 30 29  'szvkT$ ALT 0 - 44 U/L 42 51(H) 43   03/04/2020 BCR/ABL:   01/07/2020 BCR/ABL:   10/15/2019 BCR/ABL:    05/21/2019 BCR/ABL:    03/26/2019 BCR/ABL:    01/28/2019 FISH:   07/25/18 Initial BCR-ABL:    06/03/18 FISH:   05/16/18 Cytogenetics:   04/10/18 Hemochromatosis Panel:  RADIOGRAPHIC STUDIES: I have personally reviewed the radiological images as listed and agreed with the findings in the report. DG Neck Soft Tissue  Result Date: 03/12/2020 CLINICAL DATA:  Evaluation for metallic foreign body. EXAM: NECK SOFT TISSUES - 1+ VIEW COMPARISON:  CT 10/09/2019. FINDINGS: Paraspinal soft tissues are unremarkable. Epiglottis and retropharyngeal space are normal. Cervical airway widely patent. No radiopaque soft tissue foreign body. Prior C5 through C7 anterior fusion. Hardware intact. Anatomic alignment. Degenerative change thoracic spine. Pulmonary apices are clear. IMPRESSION: 1.  Soft tissues unremarkable.  No radiopaque foreign body. 2. Prior C5 through C7 anterior fusion. Hardware intact. Anatomic alignment. Electronically Signed   By: Marcello Moores  Register   On: 03/12/2020 15:25   MM DIAG BREAST TOMO UNI  RIGHT  Result Date: 03/17/2020 CLINICAL DATA:  Short interval follow-up of RIGHT breast. Patient had stereotactic guided core biopsy of RIGHT breast calcifications performed in June 2021. Pathology showed atypical lobular hyperplasia with microcalcifications. Patient met with Dr. Georgette [name] and it was decided that the patient would have mammographic follow-up rather than excision. Patient has a personal history of chronic myelocytic leukemia. EXAM: DIGITAL DIAGNOSTIC UNILATERAL RIGHT MAMMOGRAM WITH TOMO AND CAD COMPARISON:  Previous exam(s). ACR Breast Density Category c: The breast tissue is heterogeneously dense, which may obscure small masses. FINDINGS: X shaped clip is identified in the LATERAL portion of the RIGHT breast, associated with few residual calcifications. The appearance is similar to post clip images at the time of stereotactic biopsy. There are no suspicious changes. No suspicious mass or distortion in the RIGHT breast. Mammographic images were processed with CAD. IMPRESSION: Stable appearance of RIGHT breast calcifications at the site of atypical lobular hyperplasia. RECOMMENDATION: Recommend bilateral diagnostic mammogram in 6 months. After that, consider 1 additional diagnostic mammogram in June 2023. Patient is scheduled see Dr. Georgette [name] for follow-up in January 2022. I have discussed the findings and recommendations with the patient. If applicable, a reminder letter will be sent to the patient regarding the next appointment. BI-RADS CATEGORY  2: Benign. Electronically Signed   By: Nolon Nations M.D.   On: 03/17/2020 12:18    01/31/18 ECHO Transthoracic:    ASSESSMENT & PLAN:   49 y.o. female with  1. CML- Chronic phase 05/16/18 BM Cytogenetics revealed translocation 9;22. FISH report revealed BCR-ABL gene rearrangement present in 86.5% of cells 06/03/18 FISH revealed BCR-ABL gene rearrangement present in 85.67% of cells, with some hypercellularity around 80% 06/05/18 US Abdomen revealed  normal spleen size 01/31/18 ECHO revealed LV EF of 63%, no regional wall motion abnormalities, no significant valvular hear disease  Initial pre-treatment 07/25/18 BCR-ABL revealed IS at 71.1362%  08/28/18 EKG reviewed  2. 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 -Advised pt that 03/04/20 BCR/ABL is now detectable but pt continues to be in MMR. -Advised pt that we would not expect to see these symptoms from [facility] when someone is barely detectable. -Discussed switching to a different TKI with less side effect profile. Pt prefers to continue Imatinib at this time. -Pt feels that her symptoms are manageable and not debilitating at 200 mg Imatinib.  -The pt has no prohibitive toxicities from continuing 200 mg Imatinib at this time. -Continue 2 mg Copper on M,W,F.  -Will see back in 3 months, with labs 2 weeks prior   FOLLOW UP: Labs in 10 weeks Phone visit with Dr Irene Limbo in 3  months   The total time spent in the appt was 35 minutes and more than 50% was on counseling and direct patient cares.  All of the patient's questions were answered with apparent satisfaction. The patient knows to call the clinic with any problems, questions or concerns.   Sullivan Lone MD [place] AAHIVMS [facility] Hematology/Oncology Physician [facility]  (Office):       [phone] (Work cell):  [phone] (Fax):           [phone]  03/18/2020 9:56 AM  I, Yevette Edwards, am acting as a scribe for Dr. Sullivan Lone.   .I have reviewed the above documentation for accuracy and completeness, and I agree with the above. Brunetta Genera MD

## 2020-03-23 ENCOUNTER — Other Ambulatory Visit: Payer: Self-pay | Admitting: *Deleted

## 2020-03-23 DIAGNOSIS — C9210 Chronic myeloid leukemia, BCR/ABL-positive, not having achieved remission: Secondary | ICD-10-CM

## 2020-03-23 MED ORDER — IMATINIB MESYLATE 100 MG PO TABS: 200.0000 mg | ORAL_TABLET | Freq: Every day | ORAL | 3 refills | Status: DC

## 2020-04-06 ENCOUNTER — Other Ambulatory Visit: Payer: Self-pay

## 2020-04-06 ENCOUNTER — Encounter: Payer: Self-pay | Admitting: Allergy and Immunology

## 2020-04-06 ENCOUNTER — Ambulatory Visit (INDEPENDENT_AMBULATORY_CARE_PROVIDER_SITE_OTHER): Payer: 59 | Admitting: Allergy and Immunology

## 2020-04-06 VITALS — BP 130/78 | HR 97 | Temp 97.5°F | Resp 16 | Ht 66.0 in | Wt 195.2 lb

## 2020-04-06 DIAGNOSIS — C9210 Chronic myeloid leukemia, BCR/ABL-positive, not having achieved remission: Secondary | ICD-10-CM

## 2020-04-06 DIAGNOSIS — L500 Allergic urticaria: Secondary | ICD-10-CM | POA: Diagnosis not present

## 2020-04-06 DIAGNOSIS — J3089 Other allergic rhinitis: Secondary | ICD-10-CM | POA: Diagnosis not present

## 2020-04-06 NOTE — Progress Notes (Signed)
[place]   Follow-up Note  Referring Provider: Darreld Mclean, MD Primary Provider: Darreld Mclean, MD Date of Office Visit: 04/06/2020  Subjective:   Victoria Hall (DOB: Sep 05, 1970) is a 50 y.o. female who returns to the [facility] [place] on 04/06/2020 in re-evaluation of the following:  HPI: Victoria Hall returns to this clinic in evaluation of several issues.  I have not seen her in this clinic since 23 April 2018.  During her last evaluation there appeared to be significant basophilia and neutrophilia that was a persistent issue and she went on to receive the diagnosis of CML with BCR-ABL translocation treated with imatinib.  A lot of her chronic symptoms which were multiorgan in etiology including rashes and flushing and hives and diarrhea and constipation and night sweats and joint arthralgia appear to respond but she has also developed some side effects with the use of imatinib including significant rash at high dose that was managed to some degree with Claritin and Pepcid.  She has been altering her dose of imatinib to see if she can find a dose that produces good results regarding prevention of her chronic symptoms and does not precipitate any side effects.  Her concern today is the fact that she may be having another systemic disease besides for CML that appears to respond to imatinib and when she lowers the dose of imatinib this other systemic disease presents itself with various symptomatology.  In the past she has been thoroughly evaluated at [facility] for mastocytosis and during the bone marrow biopsy of which she received the diagnosis of CML an analysis of c-kit mutation was performed which did not identify a genetic mutation consistent with mastocytosis and she had a screening 24-hour urine test for 5-HIAA and investigation of a flushing disorder which was normal..  Allergies as of 04/06/2020      Reactions    Clindamycin/lincomycin Itching, Other (See Comments)   Dizziness, trouble swallowing   Codeine Itching, Nausea And Vomiting   Cyclobenzaprine Other (See Comments)   Fast heartbeat, restless leg, nightmares   Gabapentin Other (See Comments)   Headaches, visual disturbances.    Nyquil Multi-symptom [pseudoeph-doxylamine-dm-apap] Other (See Comments)   Heart racing, dizziness, weakness   Uribel [meth-hyo-m Bl-na Phos-ph Sal] Other (See Comments)   Heart racing, dizziness, blurry vision.   Xanax [alprazolam]    Bruising   Urelle Nausea And Vomiting      Medication List      cholecalciferol 25 MCG (1000 UNIT) tablet Commonly known as: VITAMIN D3 Take 2,000 Units by mouth once a week.   diazepam 2 MG tablet Commonly known as: VALIUM Take 0.5-1 tablets (1-2 mg total) by mouth every 8 (eight) hours as needed for anxiety.   famotidine 20 MG tablet Commonly known as: PEPCID Take 10 mg by mouth daily.   imatinib 100 MG tablet Commonly known as: GLEEVEC Take 2 tablets (200 mg total) by mouth daily. Take with meals and large glass of water.Caution:Chemotherapy   loratadine 10 MG tablet Commonly known as: CLARITIN Take 10 mg by mouth daily.   vitamin C 100 MG tablet Take 100 mg by mouth daily.       Past Medical History:  Diagnosis Date  . Anxiety    [facility]- [facility] due to Low BP   . Arthritis   . Asthma    early teens only   . Cervical intraepithelial neoplasia grade 3 2002  s/p LEEP Rx repeat pap negative  . Cervical radiculopathy   . CML (chronic myelocytic leukemia) ([place]) 06/03/2018  . Fallopian tube disorder    right fallopian tube removed  . Gallstones 09/2015   On CT  . Headache    Migraines  . Hemorrhagic ovarian cyst 02/08/2016  . Hydrosalpinx    [facility]  . Hydrosalpinx 02/08/2016  . Interstitial cystitis   . Neurological abnormality    Neg workup with MRI/MRA in 2007 WNL except decreased caliber MCA proximally, distal  cavernous portion of left LCA which may represent true stenosis or technique on exam. Evaluated by Dr Linus Salmons.  . Palpitations   . Partial seizures (HCC)    questionable diag  . Pneumonia    walking pneumonia  . Polycystic ovary    multiple ovarian cysts removed  . S/P cervical discectomy    Dr Vertell Limber, anterior discectomy C5-C7  . S/P epidural steroid injection    Right hip  . Seizures ([place]) 2005   simple partial seizure disorder-was mast cell problems per pt 09-15-2019- no seizures per pt     Past Surgical History:  Procedure Laterality Date  . APPENDECTOMY  1986  . BREAST BIOPSY Right 09/2019  . BUNIONECTOMY Left    bunion removal  . CERVICAL DISC SURGERY  2005   C5-C7  . COLONOSCOPY    . core breast biopsy     . DENTAL SURGERY    . fallopian tue removed    . LAPAROSCOPIC BILATERAL SALPINGECTOMY Left 02/09/2016   Procedure: LAPAROSCOPIC LEFT  SALPINGECTOMY;  Surgeon: Janyth Contes, MD;  Location: [facility];  Service: Gynecology;  Laterality: Left;  . LAPAROSCOPIC LYSIS OF ADHESIONS  02/09/2016   Procedure: LAPAROSCOPIC LYSIS OF ADHESIONS;  Surgeon: Janyth Contes, MD;  Location: [facility];  Service: Gynecology;;  momentum to adenexa  . LAPAROSCOPIC OVARIAN CYSTECTOMY Right 02/09/2016   Procedure: LAPAROSCOPIC OVARIAN CYSTECTOMY;  Surgeon: Janyth Contes, MD;  Location: [facility];  Service: Gynecology;  Laterality: Right;  x2 to right ovary  . LEEP  1998  . OOPHORECTOMY Left 02/09/2016   Procedure: LEFT OOPHORECTOMY;  Surgeon: Janyth Contes, MD;  Location: [facility];  Service: Gynecology;  Laterality: Left;- pt states left ovary has NOT been removed   . POLYPECTOMY    . TONSILLECTOMY  2001  . UPPER GASTROINTESTINAL ENDOSCOPY    . UPPER GI ENDOSCOPY      Review of systems negative except as noted in HPI / PMHx or noted below:  Review of Systems  Constitutional: Negative.   HENT: Negative.   Eyes: Negative.   Respiratory: Negative.   Cardiovascular: Negative.    Gastrointestinal: Negative.   Genitourinary: Negative.   Musculoskeletal: Negative.   Skin: Negative.   Neurological: Negative.   Endo/Heme/Allergies: Negative.   Psychiatric/Behavioral: Negative.      Objective:   Vitals:   04/06/20 1336  BP: 130/78  Pulse: 97  Resp: 16  Temp: (!) 97.5 F (36.4 C)  SpO2: 98%   Height: _0  (167.6 cm)  Weight: 195 lb 3.2 oz (88.5 kg)   Physical Exam Constitutional:      Appearance: She is not diaphoretic.  HENT:     Head: Normocephalic.     Right Ear: Tympanic membrane, ear canal and external ear normal.     Left Ear: Tympanic membrane, ear canal and external ear normal.     Nose: Nose normal. No mucosal edema or rhinorrhea.     Mouth/Throat:     Mouth:  Oropharynx is clear and moist and mucous membranes are normal.     Pharynx: Uvula midline. No oropharyngeal exudate.  Eyes:     Conjunctiva/sclera: Conjunctivae normal.  Neck:     Thyroid: No thyromegaly.     Trachea: Trachea normal. No tracheal tenderness or tracheal deviation.  Cardiovascular:     Rate and Rhythm: Normal rate and regular rhythm.     Heart sounds: Normal heart sounds, S1 normal and S2 normal. No murmur heard.   Pulmonary:     Effort: No respiratory distress.     Breath sounds: Normal breath sounds. No stridor. No wheezing or rales.  Musculoskeletal:        General: No edema.  Lymphadenopathy:     Head:     Right side of head: No tonsillar adenopathy.     Left side of head: No tonsillar adenopathy.     Cervical: No cervical adenopathy.  Skin:    Findings: No erythema or rash.     Nails: There is no clubbing.  Neurological:     Mental Status: She is alert.     Diagnostics: none  Assessment and Plan:   1. CML (chronic myelocytic leukemia) ([place])     I have discussed Elene's case with several physicians I respect regarding evaluation of mast cell disease and I think the consensus is that there has been a very thorough evaluation for possible mast  cell disease to date.  The only thing that can be done is to repeat the studies but everyone felt that this approach was of very low diagnostic yield and will probably not come back much different from the analysis that was obtained earlier.  If we could convince the hematologist to repeat a bone marrow biopsy and redo all the studies that would at least exclude a mast cell disease once again.  But I am not sure that a hematologist is actually going to go ahead with another bone marrow given the negative evaluation for mastocytosis or mast cell disease that was documented previously.  So that raises the possibility of utilizing a different therapy for your hematologic genetic mutation other than what you are using currently.  I am not really that well versed on therapy for Philadelphia chromosome mutations but certainly there are other tyrosine kinase inhibitors that could be tried such as nilotinib (Tasigna), dasatinib (Sprycel), or bosutinib (Bosulif). This would be an issue for the hematologist to explore.  But it should be noted that hematologist follow specific indices of therapy and your response to therapy as a measure of those indices has been very good and usually hematologist are not very excited about changing therapy if there has been such a good response.  In summary, with all the available information there does not appear to be a suggestion that a mast cell disease is precipitating her issue or issues.  Further exploration for any type of mast cell issue would require a bone marrow biopsy with appropriate diagnostic laboratory studies performed on that bone marrow biopsy.  I have relayed this conversation to [place] via [employer].  Allena Katz, MD Allergy / Immunology [place]

## 2020-04-20 ENCOUNTER — Ambulatory Visit: Payer: BC Managed Care – PPO | Admitting: Allergy and Immunology

## 2020-04-21 ENCOUNTER — Telehealth: Payer: Self-pay | Admitting: Allergy and Immunology

## 2020-04-21 ENCOUNTER — Telehealth: Payer: Self-pay | Admitting: Family Medicine

## 2020-04-21 DIAGNOSIS — Z0000 Encounter for general adult medical examination without abnormal findings: Secondary | ICD-10-CM

## 2020-04-21 DIAGNOSIS — Z0100 Encounter for examination of eyes and vision without abnormal findings: Secondary | ICD-10-CM

## 2020-04-21 DIAGNOSIS — Z789 Other specified health status: Secondary | ICD-10-CM

## 2020-04-21 NOTE — Telephone Encounter (Signed)
Patient was returning your call [phone].

## 2020-04-21 NOTE — Telephone Encounter (Signed)
Requesting a referral to :  Dr Darrick [facility] [place], [place], [place] [address] Hours:  Open ? [place]: Mask required  Staff wear masks  More details Phone: [phone]

## 2020-04-21 NOTE — Telephone Encounter (Signed)
Pt's husband request a call back, stated they were waiting on a call back from Dr. Neldon Mc.

## 2020-04-21 NOTE — Telephone Encounter (Signed)
Referrals placed for patient

## 2020-04-21 NOTE — Telephone Encounter (Signed)
Patient husband call he would like a referral for his wife to this  ophthalmologist Dr.Giegengack [phone]   Please avdice

## 2020-04-21 NOTE — Telephone Encounter (Signed)
Left a message regarding for patient to call back to discuss the reasoning of the call from Dr. Neldon Mc.

## 2020-04-22 NOTE — Telephone Encounter (Signed)
Received message from front office staff. Routed in different encounter in error. "  "Patient husband called to follow up on the  Referral request"  I have called patient to let her know referrals have been placed. She is thankful and will reach out for any further needs.

## 2020-04-22 NOTE — Telephone Encounter (Signed)
Patient husband called to follow up on the  Referral request

## 2020-04-22 NOTE — Telephone Encounter (Signed)
Patient's husband called back stating patient had Leukemia and her symptoms were probably being caused by her Leukemia in the past. She was having  immuno-inflammatory symptoms occurring an not sure what is causing this issue. Patient was having symptoms that came back possibly cause by a mast cell disorder most recently. [facility] get [facility] about Mastocytosis issue back in 2019 but found out that it was Leukemia at that time.. Patient was treated and in remission, and is waiting to speak with Dr. Neldon Mc regarding what his thoughts were since seeing him in office back 04/06/2020. Patient is concern and wanted to speak with him directly regarding this.

## 2020-04-23 NOTE — Telephone Encounter (Signed)
Patient notified

## 2020-04-23 NOTE — Telephone Encounter (Signed)
Please let Victoria Hall know that I am still awaiting a response from two different physicians about her case. I contacted them over two weeks ago and have not heard anything back. That may means they have nothing to offer. I will contact them again today.

## 2020-04-26 ENCOUNTER — Telehealth: Payer: Self-pay | Admitting: Family Medicine

## 2020-04-26 NOTE — Telephone Encounter (Signed)
Referral faxed again to [phone]

## 2020-04-26 NOTE — Telephone Encounter (Signed)
Patient states Dr.Copland placed a referral to [facility], MATTHEW, and his office has not received the referral. She would like to update fax number for provider   Marilynne Halsted Fax Number : [phone]

## 2020-04-27 NOTE — Patient Instructions (Addendum)
It was good to see you again today- take care!  We will plan to get you set up for a CT scan for your abd/pelvis to evaluate your bloating   Please let me know if I can do anything else to help

## 2020-04-27 NOTE — Progress Notes (Signed)
[facility] [address], [place], [place] [address] [phone]  Date:  04/28/2020   Name:  Victoria Hall   DOB:  12-08-1970   MRN:  [id number]  PCP:  Darreld Mclean, MD    Chief Complaint: Bloated (Enlarged liver shown on xray/) and Shortness of Breath (Medication side effect? Discuss nodule In chest xray)   History of Present Illness:  Victoria Hall is a 50 y.o. very pleasant female patient who presents with the following:  Patient with history of CML, POTS, mast cell activation syndrome, hypermobility, hemochromatosis, chronic fatigue Her oncologist is Dr. Irene Limbo Here today with concern of bloating in her abdomen Last seen by myself by video in September   Last visit with Dr Irene Limbo in December   Pt reports that she is recommended to see a mast cell specialist,-the specialists are few and far between, she may end up needing to travel to [place] or [place].  Dr Tina Griffiths is helping her with this She tends to have significant side effects from her [place] She and Dr. Irene Limbo decreased her dose of Gleevec for a while- 100 mg- she did feel better.  Her SOB and fatigue got a lot better.  She had some muscle cramps still but better She however developed some other sx such as rashes and hives.  They had her go back up to 200 mg of Gleevec and it seems to be helping resolve these new symptoms.  However, they are not sure why she responded this way to the decreased dose of Gleevec, could there be something else going on as far as a precancerous mast cell syndrome  She had a CT chest in July: IMPRESSION: 1. Mild bibasilar atelectasis. No edema or airspace opacity. No pleural effusions. No pneumothorax. 2.  No evident adenopathy. 3. No thoracic aortic aneurysm or dissection. No major vessel pulmonary embolus. No pericardial thickening or effusion. 4.  Hepatic steatosis.  She has noted swelling and bloating in  her abdomen recently  Her BM are normal She is using antihistamines - loratadine and famotidine- and this is controlling her rashes and itching mostly  She also notes that she has gained weight despite eating less and trying to watch her diet    Patient Active Problem List   Diagnosis Date Noted  . Flushing 08/08/2019  . CML (chronic myelocytic leukemia) ([place]) 06/03/2018  . Hemochromatosis 04/18/2018  . Hypermobility syndrome 09/26/2017  . Mast cell activation syndrome ([place]) 09/26/2017  . POTS (postural orthostatic tachycardia syndrome) 08/10/2017  . Incomplete right bundle branch block 07/11/2017  . Fatigue 07/11/2017  . Low back pain 07/21/2016  . Right knee pain 06/05/2016  . Left shoulder pain 06/05/2016  . Hydrosalpinx 02/08/2016  . Hemorrhagic ovarian cyst 02/08/2016  . Right hip pain 02/03/2016  . HYDROSALPINX 02/07/2008  . HEADACHE 10/28/2007  . TACHYCARDIA 10/14/2007  . ANXIETY DISORDER 05/28/2007  . POLYARTHRITIS 05/09/2007  . UNSPECIFIED DISORDERS OF NERVOUS SYSTEM 04/22/2007  . ABDOMINAL PAIN RIGHT LOWER QUADRANT 04/08/2007  . PALPITATIONS, RECURRENT 11/07/2006  . PROBLEMS W/SMELL/TASTE 06/29/2006  . TOBACCO ABUSE 01/08/2006  . VISUAL IMPAIRMENT 01/08/2006  . DISTURBANCE, VISUAL NOS 01/08/2006  . FIBROCYSTIC BREAST DISEASE 01/08/2006  . [place] DISEASE, CERVICAL 01/08/2006  . VERTIGO 01/08/2006    Past Medical History:  Diagnosis Date  . Anxiety    [facility]- [facility] due to Low BP   . Arthritis   . Asthma  early teens only   . Cervical intraepithelial neoplasia grade 3 2002   s/p LEEP Rx repeat pap negative  . Cervical radiculopathy   . CML (chronic myelocytic leukemia) ([place]) 06/03/2018  . Fallopian tube disorder    right fallopian tube removed  . Gallstones 09/2015   On CT  . Headache    Migraines  . Hemorrhagic ovarian cyst 02/08/2016  . Hydrosalpinx    [facility]  . Hydrosalpinx 02/08/2016  . Interstitial cystitis   .  Neurological abnormality    Neg workup with MRI/MRA in 2007 WNL except decreased caliber MCA proximally, distal cavernous portion of left LCA which may represent true stenosis or technique on exam. Evaluated by Dr Linus Salmons.  . Palpitations   . Partial seizures (HCC)    questionable diag  . Pneumonia    walking pneumonia  . Polycystic ovary    multiple ovarian cysts removed  . S/P cervical discectomy    Dr Vertell Limber, anterior discectomy C5-C7  . S/P epidural steroid injection    Right hip  . Seizures ([place]) 2005   simple partial seizure disorder-was mast cell problems per pt 09-15-2019- no seizures per pt     Past Surgical History:  Procedure Laterality Date  . APPENDECTOMY  1986  . BREAST BIOPSY Right 09/2019  . BUNIONECTOMY Left    bunion removal  . CERVICAL DISC SURGERY  2005   C5-C7  . COLONOSCOPY    . core breast biopsy     . DENTAL SURGERY    . fallopian tue removed    . LAPAROSCOPIC BILATERAL SALPINGECTOMY Left 02/09/2016   Procedure: LAPAROSCOPIC LEFT  SALPINGECTOMY;  Surgeon: Janyth Contes, MD;  Location: [facility];  Service: Gynecology;  Laterality: Left;  . LAPAROSCOPIC LYSIS OF ADHESIONS  02/09/2016   Procedure: LAPAROSCOPIC LYSIS OF ADHESIONS;  Surgeon: Janyth Contes, MD;  Location: [facility];  Service: Gynecology;;  momentum to adenexa  . LAPAROSCOPIC OVARIAN CYSTECTOMY Right 02/09/2016   Procedure: LAPAROSCOPIC OVARIAN CYSTECTOMY;  Surgeon: Janyth Contes, MD;  Location: [facility];  Service: Gynecology;  Laterality: Right;  x2 to right ovary  . LEEP  1998  . OOPHORECTOMY Left 02/09/2016   Procedure: LEFT OOPHORECTOMY;  Surgeon: Janyth Contes, MD;  Location: [facility];  Service: Gynecology;  Laterality: Left;- pt states left ovary has NOT been removed   . POLYPECTOMY    . TONSILLECTOMY  2001  . UPPER GASTROINTESTINAL ENDOSCOPY    . UPPER GI ENDOSCOPY      Social History   Tobacco Use  . Smoking status: Former Smoker    Packs/day: 0.00    Years: 0.00     Pack years: 0.00    Types: Cigarettes    Quit date: 11/02/2015    Years since quitting: 4.4  . Smokeless tobacco: Never Used  . Tobacco comment: currently smoking, began at beginning of June and plans to quit on June 14th. Has smoked off and on in the past.  Vaping Use  . Vaping Use: Every day  Substance Use Topics  . Alcohol use: Yes    Alcohol/week: 1.0 - 2.0 standard drink    Types: 1 - 2 Standard drinks or equivalent per week    Comment: occasionally 0-2 per day- rare   . Drug use: No    Family History  Adopted: Yes  Problem Relation Age of Onset  . COPD Mother   . Breast cancer Mother   . Other Mother        Teeth problems-  all removed by age 92  . Rashes / Skin problems Sister   . Other Sister        Teeth problems- all removed by age 44  . Rashes / Skin problems Brother   . Asthma Brother   . Other Brother        Teeth problems- all removed by age 62  . Breast cancer Maternal Aunt   . Adrenal disorder Neg Hx   . Colon cancer Neg Hx   . Colon polyps Neg Hx   . Esophageal cancer Neg Hx   . Stomach cancer Neg Hx   . Rectal cancer Neg Hx     Allergies  Allergen Reactions  . Clindamycin/Lincomycin Itching and Other (See Comments)    Dizziness, trouble swallowing  . Codeine Itching and Nausea And Vomiting  . Cyclobenzaprine Other (See Comments)    Fast heartbeat, restless leg, nightmares  . Gabapentin Other (See Comments)    Headaches, visual disturbances.   . Nyquil Multi-Symptom [Pseudoeph-Doxylamine-Dm-Apap] Other (See Comments)    Heart racing, dizziness, weakness  . Uribel [Meth-Hyo-M Bl-Na Phos-Ph Sal] Other (See Comments)    Heart racing, dizziness, blurry vision.  . Xanax [Alprazolam]     Bruising  . Urelle Nausea And Vomiting    Medication list has been reviewed and updated.  Current Outpatient Medications on File Prior to Visit  Medication Sig Dispense Refill  . Ascorbic Acid (VITAMIN C) 100 MG tablet Take 100 mg by mouth daily.    .  cholecalciferol (VITAMIN D3) 25 MCG (1000 UNIT) tablet Take 2,000 Units by mouth once a week.    . diazepam (VALIUM) 2 MG tablet Take 0.5-1 tablets (1-2 mg total) by mouth every 8 (eight) hours as needed for anxiety. 30 tablet 0  . famotidine (PEPCID) 20 MG tablet Take 10 mg by mouth daily.    [place] imatinib (GLEEVEC) 100 MG tablet Take 2 tablets (200 mg total) by mouth daily. Take with meals and large glass of water.Caution:Chemotherapy 30 tablet 3  . loratadine (CLARITIN) 10 MG tablet Take 10 mg by mouth daily.     No current facility-administered medications on file prior to visit.    Review of Systems:  As per HPI- otherwise negative.   Physical Examination: Vitals:   04/28/20 1038  BP: 116/80  Pulse: 99  Resp: 17  SpO2: 99%   Vitals:   04/28/20 1038  Weight: 195 lb (88.5 kg)  Height: 5\' 6"  (1.676 m)   Body mass index is 31.47 kg/m. Ideal Body Weight: Weight in (lb) to have BMI = 25: 154.6  GEN: no acute distress.  Overweight, otherwise looks well  HEENT: Atraumatic, Normocephalic.  Ears and Nose: No external deformity. CV: RRR, No M/G/R. No JVD. No thrill. No extra heart sounds. PULM: CTA B, no wheezes, crackles, rhonchi. No retractions. No resp. distress. No accessory muscle use. ABD: S, NT, ND, +BS. No rebound. No HSM- belly is benign. EXTR: No c/c/e PSYCH: Normally interactive. Conversant.    Assessment and Plan: Abdominal bloating - Plan: Basic metabolic panel  Early satiety  Anxiety - Plan: clonazePAM (KLONOPIN) 0.5 MG tablet   Patient with somewhat complex past medical history, here today with concern of abdominal bloating, early satiety, and weight gain.  She also notes that her liver showed evidence of fatty infiltration on recent chest imaging.  She is concerned about liver enlargement, possible ascites-she notes that she will feel short of breath if she tries to bend over, thinks this is likely  due to abdominal bloating  We will arrange a CT scan to  evaluate her abdomen  She also does have history of anxiety, will be interested in trying clonazepam as she has read this may be less likely to activate her mast cell symptoms.  I sent in a prescription, she will try half tablet first.  Can do CT at [facility] imaging  This visit occurred during the SARS-CoV-2 public health emergency.  Safety protocols were in place, including screening questions prior to the visit, additional usage of staff PPE, and extensive cleaning of exam room while observing appropriate contact time as indicated for disinfecting solutions.    Signed Lamar Blinks, MD  Received her labs as below, message to patient  Results for orders placed or performed in visit on 17/79/39  Basic metabolic panel  Result Value Ref Range   Sodium 139 135 - 145 mEq/L   Potassium 4.3 3.5 - 5.1 mEq/L   Chloride 103 96 - 112 mEq/L   CO2 30 19 - 32 mEq/L   Glucose, Bld 85 70 - 99 mg/dL   BUN 11 6 - 23 mg/dL   Creatinine, Ser 0.98 0.40 - 1.20 mg/dL   GFR 67.33 >60.00 mL/min   Calcium 9.7 8.4 - 10.5 mg/dL

## 2020-04-28 ENCOUNTER — Encounter: Payer: Self-pay | Admitting: Family Medicine

## 2020-04-28 ENCOUNTER — Other Ambulatory Visit: Payer: Self-pay

## 2020-04-28 ENCOUNTER — Ambulatory Visit: Payer: 59 | Admitting: Family Medicine

## 2020-04-28 VITALS — BP 116/80 | HR 99 | Resp 17 | Ht 66.0 in | Wt 195.0 lb

## 2020-04-28 DIAGNOSIS — R140 Abdominal distension (gaseous): Secondary | ICD-10-CM | POA: Diagnosis not present

## 2020-04-28 DIAGNOSIS — F419 Anxiety disorder, unspecified: Secondary | ICD-10-CM | POA: Diagnosis not present

## 2020-04-28 DIAGNOSIS — R6881 Early satiety: Secondary | ICD-10-CM

## 2020-04-28 LAB — BASIC METABOLIC PANEL
BUN: 11 mg/dL (ref 6–23)
CO2: 30 mEq/L (ref 19–32)
Calcium: 9.7 mg/dL (ref 8.4–10.5)
Chloride: 103 mEq/L (ref 96–112)
Creatinine, Ser: 0.98 mg/dL (ref 0.40–1.20)
GFR: 67.33 mL/min (ref >60.00)
Glucose, Bld: 85 mg/dL (ref 70–99)
Potassium: 4.3 mEq/L (ref 3.5–5.1)
Sodium: 139 mEq/L (ref 135–145)

## 2020-04-28 MED ORDER — CLONAZEPAM 0.5 MG PO TABS: 0.5000 mg | ORAL_TABLET | Freq: Two times a day (BID) | ORAL | 1 refills | Status: DC | PRN

## 2020-05-04 NOTE — Telephone Encounter (Signed)
Patient was calling back to see if you had heard anything from those doctors. [phone].

## 2020-05-05 ENCOUNTER — Encounter: Payer: Self-pay | Admitting: Allergy and Immunology

## 2020-05-06 ENCOUNTER — Telehealth: Payer: Self-pay | Admitting: *Deleted

## 2020-05-06 NOTE — Telephone Encounter (Signed)
Patient called -  has abd CT scan on 2/10 w/contrast. She wants to know if she needs to hold her Imatinib either before or after the CT. Dr.Kale informed. Contacted patient with Dr. Grier Mitts response -  though no official guidelines --okay to hold day before, day of and day after CT. Patient verbalized understanding.

## 2020-05-13 ENCOUNTER — Ambulatory Visit
Admission: RE | Admit: 2020-05-13 | Discharge: 2020-05-13 | Disposition: A | Discharge status: Home / Self Care | Payer: 59 | Source: Ambulatory Visit | Attending: Family Medicine | Admitting: Family Medicine

## 2020-05-13 ENCOUNTER — Other Ambulatory Visit: Payer: Self-pay

## 2020-05-13 ENCOUNTER — Encounter: Payer: Self-pay | Admitting: Family Medicine

## 2020-05-13 DIAGNOSIS — R140 Abdominal distension (gaseous): Secondary | ICD-10-CM

## 2020-05-13 DIAGNOSIS — R6881 Early satiety: Secondary | ICD-10-CM

## 2020-05-13 MED ORDER — IOPAMIDOL (ISOVUE-300) INJECTION 61%
100.0000 mL | Freq: Once | INTRAVENOUS | Status: AC | PRN
  Administered 2020-05-13: 100 mL via INTRAVENOUS

## 2020-05-13 NOTE — Telephone Encounter (Signed)
Will need to follow up on this

## 2020-05-18 ENCOUNTER — Ambulatory Visit: Payer: 59 | Attending: Internal Medicine

## 2020-05-18 DIAGNOSIS — Z23 Encounter for immunization: Secondary | ICD-10-CM

## 2020-05-18 NOTE — Progress Notes (Signed)
   Covid-19 Vaccination Clinic  Name:  Shaquella Stamant    MRN: [id number] DOB: [birth year]  05/18/2020  Ms. Litaker was observed post Covid-19 immunization for 30 minutes based on pre-vaccination screening without incident. She was provided with Vaccine Information Sheet and instruction to access the V-Safe system.  Vaccinated by [facility] Brunette  Ms. Leonhart was instructed to call 911 with any severe reactions post vaccine: Marland Kitchen Difficulty breathing  . Swelling of face and throat  . A fast heartbeat  . A bad rash all over body  . Dizziness and weakness   Immunizations Administered    Name Date Dose VIS Date Route   PFIZER Comrnaty(Gray TOP) Covid-19 Vaccine 05/18/2020 10:57 AM 0.3 mL 03/11/2020 Intramuscular   Manufacturer: [place]   Lot: [id number]   NDC: [id number]

## 2020-05-25 ENCOUNTER — Telehealth: Payer: Self-pay | Admitting: Family Medicine

## 2020-05-25 NOTE — Telephone Encounter (Signed)
Patient states she would like to go to [facility]. To Dr Lloyd Huger.    [facility] 4.0 29 Google reviews Pulmonologist in [place], [place] COVID-19 info: [email or web address] Get online care: [email or web address] Located in: [employer] Address: [address], [place],  [address] Hours:  Closes soon ? 5PM ? [place]: Mask required  Staff wear masks  More details Phone: [phone] Suggest an edit Questions & answers

## 2020-05-26 NOTE — Telephone Encounter (Signed)
Done Sent [facility] fax [facility] DR[facility]Lugar

## 2020-05-27 ENCOUNTER — Other Ambulatory Visit: Payer: Self-pay

## 2020-05-27 ENCOUNTER — Inpatient Hospital Stay: Payer: 59 | Attending: Family

## 2020-05-27 DIAGNOSIS — C9210 Chronic myeloid leukemia, BCR/ABL-positive, not having achieved remission: Secondary | ICD-10-CM | POA: Diagnosis not present

## 2020-05-27 LAB — CMP (CANCER CENTER ONLY)
ALT: 41 U/L (ref 0–44)
AST: 25 U/L (ref 15–41)
Albumin: 4.3 g/dL (ref 3.5–5.0)
Alkaline Phosphatase: 73 U/L (ref 38–126)
Anion gap: 9 (ref 5–15)
BUN: 11 mg/dL (ref 6–20)
CO2: 26 mmol/L (ref 22–32)
Calcium: 9.4 mg/dL (ref 8.9–10.3)
Chloride: 105 mmol/L (ref 98–111)
Creatinine: 1.06 mg/dL — ABNORMAL HIGH (ref 0.44–1.00)
GFR, Estimated: >60 mL/min (ref >60)
Glucose, Bld: 92 mg/dL (ref 70–99)
Potassium: 4.3 mmol/L (ref 3.5–5.1)
Sodium: 140 mmol/L (ref 135–145)
Total Bilirubin: 0.4 mg/dL (ref 0.3–1.2)
Total Protein: 7.5 g/dL (ref 6.5–8.1)

## 2020-05-27 LAB — CBC WITH DIFFERENTIAL/PLATELET
Abs Immature Granulocytes: 0.01 10*3/uL (ref 0.00–0.07)
Basophils Absolute: 0.1 10*3/uL (ref 0.0–0.1)
Basophils Relative: 1 %
Eosinophils Absolute: 0.3 10*3/uL (ref 0.0–0.5)
Eosinophils Relative: 4 %
HCT: 41.4 % (ref 36.0–46.0)
Hemoglobin: 14 g/dL (ref 12.0–15.0)
Immature Granulocytes: 0 %
Lymphocytes Relative: 32 %
Lymphs Abs: 2.7 10*3/uL (ref 0.7–4.0)
MCH: 29.7 pg (ref 26.0–34.0)
MCHC: 33.8 g/dL (ref 30.0–36.0)
MCV: 87.9 fL (ref 80.0–100.0)
Monocytes Absolute: 0.6 10*3/uL (ref 0.1–1.0)
Monocytes Relative: 8 %
Neutro Abs: 4.6 10*3/uL (ref 1.7–7.7)
Neutrophils Relative %: 55 %
Platelets: 316 10*3/uL (ref 150–400)
RBC: 4.71 MIL/uL (ref 3.87–5.11)
RDW: 13.2 % (ref 11.5–15.5)
WBC: 8.4 10*3/uL (ref 4.0–10.5)
nRBC: 0 % (ref 0.0–0.2)

## 2020-05-27 LAB — VITAMIN D 25 HYDROXY (VIT D DEFICIENCY, FRACTURES): Vit D, 25-Hydroxy: 31.49 ng/mL (ref 30–100)

## 2020-05-27 LAB — TSH: TSH: 2.416 u[IU]/mL (ref 0.308–3.960)

## 2020-05-29 LAB — COPPER, SERUM: Copper: 69 ug/dL — ABNORMAL LOW (ref 80–158)

## 2020-06-01 ENCOUNTER — Telehealth: Payer: Self-pay

## 2020-06-01 NOTE — Telephone Encounter (Signed)
Received call that it appears that on 2/24 this pt. was supposed to get a BCR-ABL drawn along with her other labs, but this was not done even though it was released. Lab wants to know if Dr. Irene Limbo needs this done sooner or wait till pt's. next visit.

## 2020-06-02 ENCOUNTER — Telehealth: Payer: Self-pay | Admitting: *Deleted

## 2020-06-02 ENCOUNTER — Other Ambulatory Visit: Payer: Self-pay | Admitting: *Deleted

## 2020-06-02 DIAGNOSIS — C9210 Chronic myeloid leukemia, BCR/ABL-positive, not having achieved remission: Secondary | ICD-10-CM

## 2020-06-02 NOTE — Telephone Encounter (Signed)
Contacted patient per Dr. Grier Mitts request: "Patient needs to have BCR-ABL molecular testing done so we have the results to discuss at her phone visit on 3/17."   LVM with information and informed patient that scheduling will contact her to schedule appt - Also requested patient contact office at her convenience  to confirm receipt of message.

## 2020-06-03 ENCOUNTER — Other Ambulatory Visit: Payer: Self-pay | Admitting: *Deleted

## 2020-06-03 ENCOUNTER — Other Ambulatory Visit: Payer: Self-pay

## 2020-06-03 ENCOUNTER — Inpatient Hospital Stay: Payer: 59 | Attending: Family

## 2020-06-03 ENCOUNTER — Telehealth: Payer: Self-pay | Admitting: *Deleted

## 2020-06-03 DIAGNOSIS — Z87891 Personal history of nicotine dependence: Secondary | ICD-10-CM | POA: Insufficient documentation

## 2020-06-03 DIAGNOSIS — C9210 Chronic myeloid leukemia, BCR/ABL-positive, not having achieved remission: Secondary | ICD-10-CM | POA: Insufficient documentation

## 2020-06-03 DIAGNOSIS — N6091 Unspecified benign mammary dysplasia of right breast: Secondary | ICD-10-CM

## 2020-06-03 DIAGNOSIS — E610 Copper deficiency: Secondary | ICD-10-CM | POA: Insufficient documentation

## 2020-06-03 DIAGNOSIS — Z79899 Other long term (current) drug therapy: Secondary | ICD-10-CM | POA: Insufficient documentation

## 2020-06-03 NOTE — Telephone Encounter (Signed)
Patient called office in response to messages. Scheduled her for lab appt this morning to have BCR-ABL drawn.

## 2020-06-07 ENCOUNTER — Encounter: Payer: Self-pay | Admitting: Family Medicine

## 2020-06-08 ENCOUNTER — Encounter: Payer: Self-pay | Admitting: Family Medicine

## 2020-06-09 LAB — BCR/ABL

## 2020-06-16 NOTE — Progress Notes (Signed)
HEMATOLOGY/ONCOLOGY CLINIC NOTE  Date of Service: 06/17/2020  Patient Care Team: Copland, Gay Filler, MD as PCP - General (Family Medicine)  CHIEF COMPLAINTS/PURPOSE OF CONSULTATION:  Chronic Myeloid Leukemia  HISTORY OF PRESENTING ILLNESS:   Victoria Hall is a wonderful 50 y.o. female who has been referred to [place] by Dr. Silvestre Hall for evaluation and management of CML. The pt was formerly under the care of my colleague Dr. Burney Hall, and has transferred her care closer to home. The pt reports that she is doing well overall.  Prior to today's visit, the pt was recently diagnosed with CML after a 05/16/18 bone marrow biopsy revealed a translocation 9;22 and FISH study revealed BCR-ABL gene rearranagement in 85% of cells. The pt was prescribed 434m Imatinib with Dr. EMarin Hall 06/03/18.  The pt reports that she had intermittent blurry vision for the last 4 years. She notes that she had night sweats for the last 20 years. She notes that her WBC have been elevated for the last 4 years. She notes that she has had elevated Basophils since April 2018. She has been evaluated for mast cell activation, and has had normal tryptase levels, including most recently on 06/03/18.  [facility]. The pt notes that she has "exercise intolerance," for the last 4 years, which she describes as needing to "lie on the couch for 2 weeks," after taking a run.  She has had intermittent rashes and hives for the last 20 years, which do resolve on their own. She keeps a food and activity journal for the last 2 years, and denies awareness of any associations with her rash onset. She has seen dermatology and immunology regarding this. The pt has also seen a rheumatologist for evaluation.  The pt notes that her hands and feet frequently fall asleep, and has been evaluated by neurology with an NCV. She notes that she has cervical  radiculopathy which she relates to her 2005 C5-7 fusion. She endorses exhaustion which "comes on out of no where." She notes her energy levels change "from minute to minute."   The pt denies abdominal pains or mouth sores.   The pt endorse frequent muscle and joint cramping as well. She has tried using CBD oil which she found to be helpful, but notes that the cost became prohibitive.  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  Most recent lab results (06/03/18) of CBC w/diff and CMP is as follows: all values are WNL except for WBC at 18.8k, ANC at 12.9k Basophils abs at 600, Abs immature granulocytes at 1.02k 06/24/18 LDH at 237  On review of systems, pt reports intermittent sudden-onset fatigue, intermittent headaches, intermittent hives and rashes, muscle and joint cramping, and denies abdominal pains, mouth sores, leg swelling, and any other symptoms.   On PMHx the pt reports chronic cervical radiculopathy with fusion of C5-7 in 2005. 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  Interval History:  I connected with Victoria Clevelandon 06/17/2020 by telephone and verified that I am speaking with the correct person using two identifiers.   I discussed the limitations of evaluation and management by telemedicine. The patient expressed understanding and agreed to proceed.   Other persons participating in the visit  and their role in the encounter:                                                         - Reinaldo Raddle, Medical Scribe     Patient's location: Home Provider's location: [place] [facility] [place] is a wonderful 50 y.o. female who is here for evaluation and management of CML. The patient's last visit with [place] was on 03/18/2020. The pt reports that she is doing well  overall.  The pt reports that she went to see Dr. Gaylyn Rong who did not give her a full review for Mastocytosis. She said they claimed this was due to being evaluated at [facility]. The pt notes that she has an appointment with another allergist at [facility]. The pt reports that she has hip pain still and bone pains. The pt notes that her husband believes she is improving. The pt notes that the rash she has is still there on her upper arms, but not on her chest and abdomen anymore.   Lab results 05/27/2020 of CBC w/diff and CMP is as follows: all values are WNL except for Creatinine of 1.06. 05/27/2020 Vitamin D 25 Hydroxy of 31.49. 05/27/2020 TSH of 2.416. 05/27/2020 Copper of 69.  06/03/2020 BCR-ABL detected (0.0039%)., in MMR  On review of systems, pt reports hip pain, joint pain, skin rash and denies any other symptoms.  MEDICAL HISTORY:  Past Medical History:  Diagnosis Date  . Anxiety    [facility] hx- [facility] due to Low BP   . Arthritis   . Asthma    early teens only   . Cervical intraepithelial neoplasia grade 3 2002   s/p LEEP Rx repeat pap negative  . Cervical radiculopathy   . CML (chronic myelocytic leukemia) ([place]) 06/03/2018  . Fallopian tube disorder    right fallopian tube removed  . Gallstones 09/2015   On CT  . Headache    Migraines  . Hemorrhagic ovarian cyst 02/08/2016  . Hydrosalpinx    [facility]  . Hydrosalpinx 02/08/2016  . Interstitial cystitis   . Neurological abnormality    Neg workup with MRI/MRA in 2007 WNL except decreased caliber MCA proximally, distal cavernous portion of left LCA which may represent true stenosis or technique on exam. Evaluated by Dr Linus Salmons.  . Palpitations   . Partial seizures (H[facility])    questionable diag  . Pneumonia    walking pneumonia  . Polycystic ovary    multiple ovarian cysts removed  . S/P cervical discectomy    Dr Vertell Limber, anterior discectomy C5-C7  . S/P epidural steroid injection    Right hip  .  Seizures ([place]) 2005   simple partial seizure disorder-was mast cell problems per pt 09-15-2019- no seizures per pt     SURGICAL HISTORY: Past Surgical History:  Procedure Laterality Date  . APPENDECTOMY  1986  . BREAST BIOPSY Right 09/2019  . BUNIONECTOMY Left    bunion removal  . CERVICAL DISC SURGERY  2005   C5-C7  . COLONOSCOPY    . core breast biopsy     . DENTAL SURGERY    . fallopian tue removed    . LAPAROSCOPIC BILATERAL SALPINGECTOMY Left 02/09/2016   Procedure: LAPAROSCOPIC LEFT  SALPINGECTOMY;  Surgeon: Janyth Contes, MD;  Location:  [facility];  Service: Gynecology;  Laterality: Left;  . LAPAROSCOPIC LYSIS OF ADHESIONS  02/09/2016   Procedure: LAPAROSCOPIC LYSIS OF ADHESIONS;  Surgeon: Janyth Contes, MD;  Location: [facility];  Service: Gynecology;;  momentum to adenexa  . LAPAROSCOPIC OVARIAN CYSTECTOMY Right 02/09/2016   Procedure: LAPAROSCOPIC OVARIAN CYSTECTOMY;  Surgeon: Janyth Contes, MD;  Location: [facility];  Service: Gynecology;  Laterality: Right;  x2 to right ovary  . LEEP  1998  . OOPHORECTOMY Left 02/09/2016   Procedure: LEFT OOPHORECTOMY;  Surgeon: Janyth Contes, MD;  Location: [facility];  Service: Gynecology;  Laterality: Left;- pt states left ovary has NOT been removed   . POLYPECTOMY    . TONSILLECTOMY  2001  . UPPER GASTROINTESTINAL ENDOSCOPY    . UPPER GI ENDOSCOPY      SOCIAL HISTORY: Social History   Socioeconomic History  . Marital status: Married    Spouse name: Not on file  . Number of children: 0  . Years of education: Not on file  . Highest education level: Not on file  Occupational History  . 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  . Occupation: Pharmacist, hospital  . Occupation: Curator  Tobacco Use  . Smoking status: Former Smoker    Packs/day: 0.00    Years: 0.00    Pack years: 0.00    Types: Cigarettes    Quit date: 11/02/2015    Years since quitting: 4.6  . Smokeless  tobacco: Never Used  . Tobacco comment: currently smoking, began at beginning of June and plans to quit on June 14th. Has smoked off and on in the past.  Vaping Use  . Vaping Use: Every day  Substance and Sexual Activity  . Alcohol use: Yes    Alcohol/week: 1.0 - 2.0 standard drink    Types: 1 - 2 Standard drinks or equivalent per week    Comment: occasionally 0-2 per day- rare   . Drug use: No  . Sexual activity: Yes  Other Topics Concern  . Not on file  Social History Narrative  . Not on file   Social Determinants of Health   Financial Resource Strain: Not on file  Food Insecurity: Not on file  Transportation Needs: Not on file  Physical Activity: Not on file  Stress: Not on file  Social Connections: Not on file  Intimate Partner Violence: Not on file    FAMILY HISTORY: Family History  Adopted: Yes  Problem Relation Age of Onset  . COPD Mother   . Breast cancer Mother   . Other Mother        Teeth problems- all removed by age 45  . Rashes / Skin problems Sister   . Other Sister        Teeth problems- all removed by age 61  . Rashes / Skin problems Brother   . Asthma Brother   . Other Brother        Teeth problems- all removed by age 24  . Breast cancer Maternal Aunt   . Adrenal disorder Neg Hx   . Colon cancer Neg Hx   . Colon polyps Neg Hx   . Esophageal cancer Neg Hx   . Stomach cancer Neg Hx   . Rectal cancer Neg Hx     ALLERGIES:  is allergic to clindamycin/lincomycin, codeine, cyclobenzaprine, gabapentin, nyquil multi-symptom [pseudoeph-doxylamine-dm-apap], uribel [meth-hyo-m bl-na phos-ph sal], xanax [alprazolam], and urelle.  MEDICATIONS:  Current Outpatient Medications  Medication Sig Dispense Refill  .  Ascorbic Acid (VITAMIN C) 100 MG tablet Take 100 mg by mouth daily.    . cholecalciferol (VITAMIN D3) 25 MCG (1000 UNIT) tablet Take 2,000 Units by mouth once a week.    . clonazePAM (KLONOPIN) 0.5 MG tablet Take 1 tablet (0.5 mg total) by mouth 2  (two) times daily as needed for anxiety. 20 tablet 1  . famotidine (PEPCID) 20 MG tablet Take 10 mg by mouth daily.    Marland Kitchen imatinib (GLEEVEC) 100 MG tablet Take 2 tablets (200 mg total) by mouth daily. Take with meals and large glass of water.Caution:Chemotherapy 30 tablet 3  . loratadine (CLARITIN) 10 MG tablet Take 10 mg by mouth daily.     No current facility-administered medications for this visit.    REVIEW OF SYSTEMS:   10 Point review of Systems was done is negative except as noted above.   PHYSICAL EXAMINATION: There were no vitals filed for this visit. Wt Readings from Last 3 Encounters:  04/28/20 195 lb (88.5 kg)  04/06/20 195 lb 3.2 oz (88.5 kg)  12/17/19 185 lb (83.9 kg)   There is no height or weight on file to calculate BMI.     Telehealth Visit.   LABORATORY DATA:  I have reviewed the data as listed   CBC Latest Ref Rng & Units 05/27/2020 03/04/2020 01/07/2020  WBC 4.0 - 10.5 K/uL 8.4 6.5 7.6  Hemoglobin 12.0 - 15.0 g/dL 14.0 13.5 13.9  Hematocrit 36.0 - 46.0 % 41.4 39.4 40.8  Platelets 150 - 400 K/uL 316 284 295    CBC    Component Value Date/Time   WBC 8.4 05/27/2020 1031   RBC 4.71 05/27/2020 1031   HGB 14.0 05/27/2020 1031   HGB 12.5 01/16/2019 1402   HGB 15.1 03/26/2017 1101   HCT 41.4 05/27/2020 1031   HCT 44.0 03/26/2017 1101   PLT 316 05/27/2020 1031   PLT 259 01/16/2019 1402   PLT 330 03/26/2017 1101   MCV 87.9 05/27/2020 1031   MCV 84 03/26/2017 1101   MCH 29.7 05/27/2020 1031   MCHC 33.8 05/27/2020 1031   RDW 13.2 05/27/2020 1031   RDW 14.0 03/26/2017 1101   LYMPHSABS 2.7 05/27/2020 1031   LYMPHSABS 2.8 03/26/2017 1101   MONOABS 0.6 05/27/2020 1031   EOSABS 0.3 05/27/2020 1031   EOSABS 0.4 03/26/2017 1101   BASOSABS 0.1 05/27/2020 1031   BASOSABS 0.2 03/26/2017 1101     . CMP Latest Ref Rng & Units 05/27/2020 04/28/2020 03/04/2020  Glucose 70 - 99 mg/dL 92 85 104(H)  BUN 6 - 20 mg/dL _0 Creatinine 0.44 - 1.00 mg/dL 1.06(H)  0.98 1.02(H)  Sodium 135 - 145 mmol/L 140 139 140  Potassium 3.5 - 5.1 mmol/L 4.3 4.3 3.8  Chloride 98 - 111 mmol/L 105 103 106  CO2 22 - 32 mmol/L _1 Calcium 8.9 - 10.3 mg/dL 9.4 9.7 9.2  Total Protein 6.5 - 8.1 g/dL 7.5 - 6.7  Total Bilirubin 0.3 - 1.2 mg/dL 0.4 - 0.6  Alkaline Phos 38 - 126 U/L 73 - 65  AST 15 - 41 U/L 25 - 25  ALT 0 - 44 U/L 41 - 42   03/04/2020 BCR/ABL:   01/07/2020 BCR/ABL:   10/15/2019 BCR/ABL:    05/21/2019 BCR/ABL:    03/26/2019 BCR/ABL:    01/28/2019 FISH:   07/25/18 Initial BCR-ABL:    06/03/18 FISH:   05/16/18 Cytogenetics:   04/10/18 Hemochromatosis Panel:    RADIOGRAPHIC STUDIES: I  have personally reviewed the radiological images as listed and agreed with the findings in the report. No results found.  01/31/18 ECHO Transthoracic:   06/03/2020 BCR-ABL PCR    ASSESSMENT & PLAN:   50 y.o. female with  1. CML- Chronic phase 05/16/18 BM Cytogenetics revealed translocation 9;22. FISH report revealed BCR-ABL gene rearrangement present in 86.5% of cells 06/03/18 FISH revealed BCR-ABL gene rearrangement present in 85.67% of cells, with some hypercellularity around 80% 06/05/18 US Abdomen revealed normal spleen size 01/31/18 ECHO revealed LV EF of 63%, no regional wall motion abnormalities, no significant valvular hear disease  Initial pre-treatment 07/25/18 BCR-ABL revealed IS at 71.1362%  08/28/18 EKG reviewed  2. 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 Mild copper deficiency  PLAN: -Discussed pt labwork, 05/27/2020; all blood counts completely normal, blood counts normal, chemistries stable, TSH normal, Vit D low normal, copper lower. -Discussed pt 06/03/2020 BCR-ABL; dropped by another log. At a very minimal level.remains in MMR -Advised pt that if she has an active rash, the dermatologist can biopsy this. -Advised pt that we will keep Imatinib dose @ 200 mg po  daily due to known occurred toxicities from dosage at 300 mg. -Discussed options of changing medications to observe if no toxicities from other TKI. The pt wishes to continue on Imatinib at this time due to unknown toxicities of other drugs. -Recommended pt f/u w dermatologist ASAP if she has active rash. -The pt has no prohibitive toxicities from continuing 200 mg Imatinib at this time. -Start 2 mg Copper on M,W,F. The pt denies being on this prior. Goal Copper of >=80.  -Start 5000 IU Vitamin D daily. Discussed Ergocalciferol if pt prefers. -Continue daily antihistamines.  -Will see back in 3 months. Labs in 10 weeks.   FOLLOW UP: Labs in 10 weeks Phone visit with Dr Irene Limbo in 3 months   The total time spent in the appointment was 30 minutes and more than 50% was on counseling and direct patient cares.   All of the patient's questions were answered with apparent satisfaction. The patient knows to call the clinic with any problems, questions or concerns.   Sullivan Lone MD [place] AAHIVMS [facility] Hematology/Oncology Physician [facility]  (Office):       [phone] (Work cell):  [phone] (Fax):           [phone]  06/17/2020 2:34 PM  I, Reinaldo Raddle, am acting as scribe for Dr. Sullivan Lone, MD.     .I have reviewed the above documentation for accuracy and completeness, and I agree with the above. Brunetta Genera MD

## 2020-06-17 ENCOUNTER — Inpatient Hospital Stay (HOSPITAL_BASED_OUTPATIENT_CLINIC_OR_DEPARTMENT_OTHER): Payer: 59 | Admitting: Hematology

## 2020-06-17 DIAGNOSIS — C9210 Chronic myeloid leukemia, BCR/ABL-positive, not having achieved remission: Secondary | ICD-10-CM | POA: Diagnosis not present

## 2020-06-17 DIAGNOSIS — Z87891 Personal history of nicotine dependence: Secondary | ICD-10-CM | POA: Diagnosis not present

## 2020-06-17 DIAGNOSIS — Z79899 Other long term (current) drug therapy: Secondary | ICD-10-CM | POA: Diagnosis not present

## 2020-06-17 DIAGNOSIS — E610 Copper deficiency: Secondary | ICD-10-CM

## 2020-06-17 DIAGNOSIS — E559 Vitamin D deficiency, unspecified: Secondary | ICD-10-CM | POA: Diagnosis not present

## 2020-06-21 ENCOUNTER — Telehealth: Payer: Self-pay | Admitting: Hematology

## 2020-06-21 NOTE — Telephone Encounter (Signed)
Scheduled follow-up appointments per 3/17 los. Patient is aware. ?

## 2020-06-22 ENCOUNTER — Other Ambulatory Visit: Payer: Self-pay | Admitting: Hematology

## 2020-06-22 DIAGNOSIS — C9210 Chronic myeloid leukemia, BCR/ABL-positive, not having achieved remission: Secondary | ICD-10-CM

## 2020-07-20 ENCOUNTER — Telehealth: Payer: Self-pay

## 2020-07-20 ENCOUNTER — Other Ambulatory Visit (HOSPITAL_COMMUNITY): Payer: Self-pay

## 2020-07-20 NOTE — Telephone Encounter (Addendum)
Oral Oncology Patient Advocate Encounter  Received notification from [place] that prior authorization for [employer] is required.  PA submitted on CoverMyMeds Key [id number] Status is pending  Oral Oncology Clinic will continue to follow.  [place] Patient [place] Phone [phone] Fax [phone] 07/20/2020 10:23 AM

## 2020-07-21 ENCOUNTER — Telehealth: Payer: Self-pay

## 2020-07-21 ENCOUNTER — Other Ambulatory Visit: Payer: Self-pay

## 2020-07-21 ENCOUNTER — Encounter: Payer: Self-pay | Admitting: Family Medicine

## 2020-07-21 DIAGNOSIS — C9210 Chronic myeloid leukemia, BCR/ABL-positive, not having achieved remission: Secondary | ICD-10-CM

## 2020-07-21 MED ORDER — TRAMADOL HCL 50 MG PO TABS: 50.0000 mg | ORAL_TABLET | Freq: Four times a day (QID) | ORAL | 0 refills | Status: AC | PRN

## 2020-07-21 NOTE — Telephone Encounter (Signed)
Patient called in for the 2:40 appt. That appt slot has been booked for some time with a Annual Physical. However I was able to put  Her in the  11:40 slot in person. If this is not okay, Please let me know and I can call to resche.

## 2020-07-21 NOTE — Telephone Encounter (Signed)
There are numerous possible causes for her findings. She appears to have copper deficiency which can cause spasticity of the muscles. Additionally Gleevec can cause these symptoms and can be improved with calcium/magnesium supplementation/hydration. It would be best to have the patient in clinic tomorrow [facility] clinic could work) to do lab work prior to starting these interventions.

## 2020-07-21 NOTE — Telephone Encounter (Signed)
Pt contacted to come in to be seen tomorrow 07/22/20 at 2:30 pm by Dr Lorenso Courier. Pt verbalized understanding.

## 2020-07-21 NOTE — Telephone Encounter (Signed)
Pt called in to report second day of "terrible"/ increase in cramping of hands and legs due to generic Gleevec. Pt states she has a history of side effects with this medication but never this bad. Pt states that when she has tried to stop it in the past per Dr Grier Mitts direction, for a short period to see if it helped,  the side-effects were even worse. Pt is unsure what to do. Pt states she is having to use a cane to walk for the first time in two years. Will let MD know for further direction.

## 2020-07-22 ENCOUNTER — Other Ambulatory Visit: Payer: Self-pay | Admitting: Hematology and Oncology

## 2020-07-22 ENCOUNTER — Ambulatory Visit (HOSPITAL_BASED_OUTPATIENT_CLINIC_OR_DEPARTMENT_OTHER)
Admission: RE | Admit: 2020-07-22 | Discharge: 2020-07-22 | Disposition: A | Discharge status: Home / Self Care | Payer: 59 | Source: Ambulatory Visit | Attending: Family Medicine | Admitting: Family Medicine

## 2020-07-22 ENCOUNTER — Other Ambulatory Visit: Payer: Self-pay

## 2020-07-22 ENCOUNTER — Encounter: Payer: Self-pay | Admitting: Family Medicine

## 2020-07-22 ENCOUNTER — Telehealth: Payer: Self-pay | Admitting: Hematology and Oncology

## 2020-07-22 ENCOUNTER — Inpatient Hospital Stay (HOSPITAL_BASED_OUTPATIENT_CLINIC_OR_DEPARTMENT_OTHER): Payer: 59 | Admitting: Hematology and Oncology

## 2020-07-22 ENCOUNTER — Inpatient Hospital Stay: Payer: 59 | Attending: Family

## 2020-07-22 ENCOUNTER — Encounter: Payer: Self-pay | Admitting: Hematology and Oncology

## 2020-07-22 ENCOUNTER — Ambulatory Visit (INDEPENDENT_AMBULATORY_CARE_PROVIDER_SITE_OTHER): Payer: 59 | Admitting: Family Medicine

## 2020-07-22 VITALS — BP 126/81 | HR 91 | Temp 97.9°F | Resp 18 | Ht 66.0 in | Wt 198.4 lb

## 2020-07-22 VITALS — BP 126/70 | HR 100 | Temp 97.2°F | Resp 18 | Ht 66.0 in | Wt 198.0 lb

## 2020-07-22 DIAGNOSIS — M25551 Pain in right hip: Secondary | ICD-10-CM

## 2020-07-22 DIAGNOSIS — C9210 Chronic myeloid leukemia, BCR/ABL-positive, not having achieved remission: Secondary | ICD-10-CM

## 2020-07-22 DIAGNOSIS — E610 Copper deficiency: Secondary | ICD-10-CM | POA: Insufficient documentation

## 2020-07-22 DIAGNOSIS — M7989 Other specified soft tissue disorders: Secondary | ICD-10-CM

## 2020-07-22 DIAGNOSIS — M62838 Other muscle spasm: Secondary | ICD-10-CM | POA: Insufficient documentation

## 2020-07-22 LAB — CBC WITH DIFFERENTIAL (CANCER CENTER ONLY)
Abs Immature Granulocytes: 0.02 K/uL (ref 0.00–0.07)
Basophils Absolute: 0.1 K/uL (ref 0.0–0.1)
Basophils Relative: 1 %
Eosinophils Absolute: 0.4 K/uL (ref 0.0–0.5)
Eosinophils Relative: 5 %
HCT: 37.7 % (ref 36.0–46.0)
Hemoglobin: 12.9 g/dL (ref 12.0–15.0)
Immature Granulocytes: 0 %
Lymphocytes Relative: 34 %
Lymphs Abs: 2.4 K/uL (ref 0.7–4.0)
MCH: 30.1 pg (ref 26.0–34.0)
MCHC: 34.2 g/dL (ref 30.0–36.0)
MCV: 87.9 fL (ref 80.0–100.0)
Monocytes Absolute: 0.5 K/uL (ref 0.1–1.0)
Monocytes Relative: 8 %
Neutro Abs: 3.7 K/uL (ref 1.7–7.7)
Neutrophils Relative %: 52 %
Platelet Count: 277 K/uL (ref 150–400)
RBC: 4.29 MIL/uL (ref 3.87–5.11)
RDW: 13.2 % (ref 11.5–15.5)
WBC Count: 7.2 K/uL (ref 4.0–10.5)
nRBC: 0 % (ref 0.0–0.2)

## 2020-07-22 LAB — CMP (CANCER CENTER ONLY)
ALT: 33 U/L (ref 0–44)
AST: 24 U/L (ref 15–41)
Albumin: 4.1 g/dL (ref 3.5–5.0)
Alkaline Phosphatase: 65 U/L (ref 38–126)
Anion gap: 9 (ref 5–15)
BUN: 10 mg/dL (ref 6–20)
CO2: 28 mmol/L (ref 22–32)
Calcium: 9.4 mg/dL (ref 8.9–10.3)
Chloride: 105 mmol/L (ref 98–111)
Creatinine: 0.98 mg/dL (ref 0.44–1.00)
GFR, Estimated: >60 mL/min (ref >60)
Glucose, Bld: 112 mg/dL — ABNORMAL HIGH (ref 70–99)
Potassium: 4 mmol/L (ref 3.5–5.1)
Sodium: 142 mmol/L (ref 135–145)
Total Bilirubin: 0.3 mg/dL (ref 0.3–1.2)
Total Protein: 7 g/dL (ref 6.5–8.1)

## 2020-07-22 LAB — LACTATE DEHYDROGENASE: LDH: 248 U/L — ABNORMAL HIGH (ref 98–192)

## 2020-07-22 LAB — MAGNESIUM: Magnesium: 1.7 mg/dL (ref 1.7–2.4)

## 2020-07-22 MED ORDER — CALCIUM GLUCONATE 500 MG PO TABS
1.0000 | ORAL_TABLET | Freq: Every day | ORAL | 1 refills | Status: DC
Start: 2020-07-22 — End: 2020-07-23

## 2020-07-22 MED ORDER — COPPER CAPS 2 MG PO CAPS: 2.0000 mg | ORAL_CAPSULE | ORAL | 0 refills | Status: DC

## 2020-07-22 MED ORDER — MAGNESIUM OXIDE 400 (241.3 MG) MG PO TABS: 400.0000 mg | ORAL_TABLET | Freq: Every day | ORAL | 3 refills | Status: DC

## 2020-07-22 NOTE — Progress Notes (Signed)
Victoria Hall:([phone]) [phone]   [phone]  PROGRESS NOTE  Patient Care Team: Copland, Gay Filler, MD as PCP - General (Family Medicine)  Hematological/Oncological History # CML on Imatinib Therapy --currently follows with Dr. Irene Limbo. Last BCR/ABL was on 06/03/2020 and showed 0.0039%. Stable on imatinib 200 mg PO daily  Interval History:  Victoria Hall 50 y.o. female with medical history significant for CML on imatinib who presents for an urgent visit. The patient's last visit was on 06/17/2020 with Dr. Irene Limbo.  Presents today with concern for muscle spasms.  [name] notes that she is having severe muscle spasms of her upper extremities which has been causing her much distress.  She reports that her right arm "curled up" in a cramped position over the last few days and she had difficulty stretching it back out.  She notes that there was some relief with a hot shower but she is continuing to have pain and discomfort.  She reports it is most severe yesterday and she is feeling somewhat better today.  The patient is aware that she has a copper deficiency has been doing her best to try to eat more copper rich foods including calves livers, however this has not been pleasant and she does not enjoy eating it.  She is also doing her best to try to find copper supplementation.  She denies having any issues with chest pain, shortness of breath, cough, fevers, chills, sweats, nausea, vomiting or diarrhea.  A full 10 point ROS is listed below.  MEDICAL HISTORY:  Past Medical History:  Diagnosis Date  . Anxiety    [facility]- [facility] due to Low BP   . Arthritis   . Asthma    early teens only   . Cervical intraepithelial neoplasia grade 3 2002   s/p LEEP Rx repeat pap negative  . Cervical radiculopathy   . CML (chronic myelocytic leukemia) ([place]) 06/03/2018  . Fallopian tube disorder    right fallopian tube removed  . Gallstones 09/2015   On CT  .  Headache    Migraines  . Hemorrhagic ovarian cyst 02/08/2016  . Hydrosalpinx    [facility]  . Hydrosalpinx 02/08/2016  . Interstitial cystitis   . Neurological abnormality    Neg workup with MRI/MRA in 2007 WNL except decreased caliber MCA proximally, distal cavernous portion of left LCA which may represent true stenosis or technique on exam. Evaluated by Dr Linus Salmons.  . Palpitations   . Partial seizures (HCC)    questionable diag  . Pneumonia    walking pneumonia  . Polycystic ovary    multiple ovarian cysts removed  . S/P cervical discectomy    Dr Vertell Limber, anterior discectomy C5-C7  . S/P epidural steroid injection    Right hip  . Seizures ([place]) 2005   simple partial seizure disorder-was mast cell problems per pt 09-15-2019- no seizures per pt     SURGICAL HISTORY: Past Surgical History:  Procedure Laterality Date  . APPENDECTOMY  1986  . BREAST BIOPSY Right 09/2019  . BUNIONECTOMY Left    bunion removal  . CERVICAL DISC SURGERY  2005   C5-C7  . COLONOSCOPY    . core breast biopsy     . DENTAL SURGERY    . fallopian tue removed    . LAPAROSCOPIC BILATERAL SALPINGECTOMY Left 02/09/2016   Procedure: LAPAROSCOPIC LEFT  SALPINGECTOMY;  Surgeon: Janyth Contes, MD;  Location: [facility];  Service: Gynecology;  Laterality: Left;  .  LAPAROSCOPIC LYSIS OF ADHESIONS  02/09/2016   Procedure: LAPAROSCOPIC LYSIS OF ADHESIONS;  Surgeon: Janyth Contes, MD;  Location: [facility];  Service: Gynecology;;  momentum to adenexa  . LAPAROSCOPIC OVARIAN CYSTECTOMY Right 02/09/2016   Procedure: LAPAROSCOPIC OVARIAN CYSTECTOMY;  Surgeon: Janyth Contes, MD;  Location: [facility];  Service: Gynecology;  Laterality: Right;  x2 to right ovary  . LEEP  1998  . OOPHORECTOMY Left 02/09/2016   Procedure: LEFT OOPHORECTOMY;  Surgeon: Janyth Contes, MD;  Location: [facility];  Service: Gynecology;  Laterality: Left;- pt states left ovary has NOT been removed   . POLYPECTOMY    .  TONSILLECTOMY  2001  . UPPER GASTROINTESTINAL ENDOSCOPY    . UPPER GI ENDOSCOPY      SOCIAL HISTORY: Social History   Socioeconomic History  . Marital status: Married    Spouse name: Not on file  . Number of children: 0  . Years of education: Not on file  . Highest education level: Not on file  Occupational History  . 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  . Occupation: Pharmacist, hospital  . Occupation: Curator  Tobacco Use  . Smoking status: Former Smoker    Packs/day: 0.00    Years: 0.00    Pack years: 0.00    Types: Cigarettes    Quit date: 11/02/2015    Years since quitting: 4.7  . Smokeless tobacco: Never Used  . Tobacco comment: currently smoking, began at beginning of June and plans to quit on June 14th. Has smoked off and on in the past.  Vaping Use  . Vaping Use: Every day  Substance and Sexual Activity  . Alcohol use: Yes    Alcohol/week: 1.0 - 2.0 standard drink    Types: 1 - 2 Standard drinks or equivalent per week    Comment: occasionally 0-2 per day- rare   . Drug use: No  . Sexual activity: Yes  Other Topics Concern  . Not on file  Social History Narrative  . Not on file   Social Determinants of Health   Financial Resource Strain: Not on file  Food Insecurity: Not on file  Transportation Needs: Not on file  Physical Activity: Not on file  Stress: Not on file  Social Connections: Not on file  Intimate Partner Violence: Not on file    FAMILY HISTORY: Family History  Adopted: Yes  Problem Relation Age of Onset  . COPD Mother   . Breast cancer Mother   . Other Mother        Teeth problems- all removed by age 79  . Rashes / Skin problems Sister   . Other Sister        Teeth problems- all removed by age 104  . Rashes / Skin problems Brother   . Asthma Brother   . Other Brother        Teeth problems- all removed by age 78  . Breast cancer Maternal Aunt   . Adrenal disorder Neg Hx   . Colon  cancer Neg Hx   . Colon polyps Neg Hx   . Esophageal cancer Neg Hx   . Stomach cancer Neg Hx   . Rectal cancer Neg Hx     ALLERGIES:  is allergic to clindamycin/lincomycin, codeine, cyclobenzaprine, gabapentin, nyquil multi-symptom [pseudoeph-doxylamine-dm-apap], uribel [meth-hyo-m bl-na phos-ph sal], xanax [alprazolam], and urelle.  MEDICATIONS:  Current Outpatient Medications  Medication Sig Dispense Refill  . Copper Gluconate (COPPER CAPS) 2 MG CAPS  Take 2 mg by mouth as directed. 80 capsule 0  . magnesium oxide (MAG-OX) 400 (241.3 Mg) MG tablet Take 1 tablet (400 mg total) by mouth daily. 30 tablet 3  . Ascorbic Acid (VITAMIN C) 100 MG tablet Take 100 mg by mouth daily.    . cholecalciferol (VITAMIN D3) 25 MCG (1000 UNIT) tablet Take 2,000 Units by mouth once a week.    . clonazePAM (KLONOPIN) 0.5 MG tablet Take 1 tablet (0.5 mg total) by mouth 2 (two) times daily as needed for anxiety. 20 tablet 1  . famotidine (PEPCID) 20 MG tablet Take 10 mg by mouth daily.    Marland Kitchen imatinib (GLEEVEC) 100 MG tablet TAKE 2 TABLETS BY MOUTH 1 TIME DAILY AT THE SAME TIME WITH FOOD AND A LARGE GLASS OF WATER. AVOID GRAPEFRUIT PRODUCTS. 60 tablet 2  . loratadine (CLARITIN) 10 MG tablet Take 10 mg by mouth daily.    [place] Calcium 500 MG TABS Please specify directions, refills and quantity 30 tablet 2  . traMADol (ULTRAM) 50 MG tablet Take 1 tablet (50 mg total) by mouth every 6 (six) hours as needed for up to 5 days. 25 tablet 0   No current facility-administered medications for this visit.    REVIEW OF SYSTEMS:   Constitutional: ( - ) fevers, ( - )  chills , ( - ) night sweats Eyes: ( - ) blurriness of vision, ( - ) double vision, ( - ) watery eyes Ears, nose, mouth, throat, and face: ( - ) mucositis, ( - ) sore throat Respiratory: ( - ) cough, ( - ) dyspnea, ( - ) wheezes Cardiovascular: ( - ) palpitation, ( - ) chest discomfort, ( - ) lower extremity swelling Gastrointestinal:  ( - ) nausea,  ( - ) heartburn, ( - ) change in bowel habits Skin: ( - ) abnormal skin rashes Lymphatics: ( - ) new lymphadenopathy, ( - ) easy bruising Neurological: ( - ) numbness, ( - ) tingling, ( - ) new weaknesses Behavioral/Psych: ( - ) mood change, ( - ) new changes  All other systems were reviewed with the patient and are negative.  PHYSICAL EXAMINATION:  Vitals:   07/22/20 1445  BP: 126/81  Pulse: 91  Resp: 18  Temp: 97.9 F (36.6 C)  SpO2: 99%   Filed Weights   07/22/20 1445  Weight: 198 lb 6.4 oz (90 kg)    GENERAL: Well-appearing middle-aged Caucasian female in no acute distress, alert, no distress and comfortable SKIN: skin color, texture, turgor are normal, no rashes or significant lesions EYES: conjunctiva are pink and non-injected, sclera clear LUNGS: clear to auscultation and percussion with normal breathing effort HEART: regular rate & rhythm and no murmurs and no lower extremity edema Musculoskeletal: no cyanosis of digits and no clubbing  PSYCH: alert & oriented x 3, fluent speech NEURO: no focal motor/sensory deficits  LABORATORY DATA:  I have reviewed the data as listed CBC Latest Ref Rng & Units 07/22/2020 05/27/2020 03/04/2020  WBC 4.0 - 10.5 K/uL 7.2 8.4 6.5  Hemoglobin 12.0 - 15.0 g/dL 12.9 14.0 13.5  Hematocrit 36.0 - 46.0 % 37.7 41.4 39.4  Platelets 150 - 400 K/uL 277 316 284    CMP Latest Ref Rng & Units 07/22/2020 05/27/2020 04/28/2020  Glucose 70 - 99 mg/dL 112(H) 92 85  BUN 6 - 20 mg/dL 10 11 11   Creatinine 0.44 - 1.00 mg/dL 0.98 1.06(H) 0.98  Sodium 135 - 145 mmol/L 142 140 139  Potassium 3.5 - 5.1 mmol/L 4.0 4.3 4.3  Chloride 98 - 111 mmol/L 105 105 103  CO2 22 - 32 mmol/L 28 26 30   Calcium 8.9 - 10.3 mg/dL 9.4 9.4 9.7  Total Protein 6.5 - 8.1 g/dL 7.0 7.5 -  Total Bilirubin 0.3 - 1.2 mg/dL 0.3 0.4 -  Alkaline Phos 38 - 126 U/L 65 73 -  AST 15 - 41 U/L 24 25 -  ALT 0 - 44 U/L 33 41 -    RADIOGRAPHIC STUDIES:  DG HIP UNILAT W OR W/O PELVIS 2-3  VIEWS RIGHT  Result Date: 07/22/2020 CLINICAL DATA:  Hip pain EXAM: DG HIP (WITH OR WITHOUT PELVIS) 2-3V RIGHT COMPARISON:  12/01/2015 FINDINGS: There is no evidence of hip fracture or dislocation. Progressive right hip joint space narrowing with tiny marginal osteophyte formation and subchondral cystic change. IMPRESSION: Progressive mild-moderate right hip osteoarthritis. Electronically Signed   By: Davina Poke D.O.   On: 07/22/2020 13:04    ASSESSMENT & PLAN Quinne Pires 50 y.o. female with medical history significant for CML on imatinib who presents for an urgent visit.  #Muscle Spasms on Imatinib #Copper Deficiency -- Findings are most consistent with specificity caused by imatinib therapy. -- NCCN guidelines recommend consideration of calcium supplementation and tonic water.  Called in 500 mg p.o. calcium gluconate and recommended the patient acquire tonic water from the local grocery store. -- Additionally there may be benefit from magnesium supplementation.  Prescribed for the patient magnesium oxide 400 mg p.o.  Noted that she could consider this if the calcium and tonic water did not improve her symptoms.  Warned her that this may cause loose stools or diarrhea --Copper deficiency may cause neurological symptoms, though I would consider this less likely than her imatinib therapy being the cause. -- In terms of copper we were unable to find a pharmacy locally that carry this including [facility] in [place].  Reportedly [facility] will do carry copper.  Would recommend the patient take 8 mg copper p.o. daily x1 week followed by 6 mg copper p.o. daily x1 week followed by 4 mg copper p.o. daily x1 week, followed by copper 2 mg p.o. daily until her levels are replete. -- Continue routine follow-up with Dr. Irene Limbo  # CML on Imatinib Therapy -- Currently under control and managed by Dr. Irene Limbo -- Continue routine follow-up with Dr. Irene Limbo   No orders of  the defined types were placed in this encounter.   All questions were answered. The patient knows to call the clinic with any problems, questions or concerns.  A total of more than 30 minutes were spent on this encounter and over half of that time was spent on counseling and coordination of care as outlined above.   Ledell Peoples, MD Department of Hematology/Oncology [place] [facility] Phone: [phone] Pager: [phone] Email: [email or web address]  07/24/2020 2:27 PM

## 2020-07-22 NOTE — Telephone Encounter (Signed)
Scheduled appt per 4/20 sch msg. Called pt, no answer. Left msg with appt date and time.

## 2020-07-22 NOTE — Patient Instructions (Addendum)
Good to see you today- I will be in touch with your hip films asap Let me know what oncology says- we can proceed to a CT of your hip if need be  I also ordered the diagnostic mammo and [place] of both underarms to be done at your convenience

## 2020-07-22 NOTE — Progress Notes (Signed)
[name] [place], [place], [place] [address] [phone]  Date:  07/22/2020   Name:  Victoria Hall   DOB:  1971-03-30   MRN:  [id number]  PCP:  Darreld Mclean, MD    Chief Complaint: Hip Pain (Right , onset: 2 days )   History of Present Illness:  Victoria Hall is a 50 y.o. very pleasant female patient who presents with the following:  Pt with complex history, here today with concern of pain in her hip Patient with history of CML, POTS, mast cell activation syndrome, hypermobility, hemochromatosis,chronic fatigue Her oncologist is Dr.Kale She contacted me about this yesterday-  Friday I started a new generic of imatinib, and since yesterday morning I've had severe muscle cramps and joint pain. I called Dr. Grier Mitts office and was told to call [facility] (Dr. Irene Limbo is out of town for a week). [facility] told me to call Dr. Irene Limbo. I called Dr. Grier Mitts nurse again, and was told to call back if it becomes intolerable. I told her it's already intolerable--that's the only time I ever call. Remember when my hip pain was so bad I was on a cane for a while before starting leukemia treatment? This is worse. It's waking me in agony all night, and I can barely walk. Other CML patients have advised that they have had issues with avascular necrosis, and that when cancer cells aggregate in the hip it can cause femoral death, with this sudden onset of pain. Since no one else is helping me, I'm turning to you. I called in tramadol for her to use as needed - she is going to pick this up today, has not tried it yet She took a 1/2 clonopin last night and it helped her sleep  Her hip pain is better today compared with yesterday, though not resolved Her right hip is painful- left is ok  She did have a hip problem in the past and saw sports med, ortho-we think this was maybe 3 or 4 years ago  She took the new med on  Friday Saturday she had pain and cramping in her right arm  Her arm was sore all day on Saturday Saturday evening her hip started to hurt- it got worse yesterdayr  She is seeing Dr Lorenso Courier later on today at oncology -her primary oncologist is out of town Patient Active Problem List   Diagnosis Date Noted  . [place] 08/08/2019  . CML (chronic myelocytic leukemia) ([place]) 06/03/2018  . Hemochromatosis 04/18/2018  . Hypermobility syndrome 09/26/2017  . Mast cell activation syndrome ([place]) 09/26/2017  . POTS (postural orthostatic tachycardia syndrome) 08/10/2017  . Incomplete right bundle branch block 07/11/2017  . Fatigue 07/11/2017  . Low back pain 07/21/2016  . Right knee pain 06/05/2016  . Left shoulder pain 06/05/2016  . Hydrosalpinx 02/08/2016  . Hemorrhagic ovarian cyst 02/08/2016  . Right hip pain 02/03/2016  . HYDROSALPINX 02/07/2008  . HEADACHE 10/28/2007  . TACHYCARDIA 10/14/2007  . ANXIETY DISORDER 05/28/2007  . POLYARTHRITIS 05/09/2007  . UNSPECIFIED DISORDERS OF NERVOUS SYSTEM 04/22/2007  . ABDOMINAL PAIN RIGHT LOWER QUADRANT 04/08/2007  . PALPITATIONS, RECURRENT 11/07/2006  . PROBLEMS W/SMELL/TASTE 06/29/2006  . TOBACCO ABUSE 01/08/2006  . VISUAL IMPAIRMENT 01/08/2006  . DISTURBANCE, VISUAL NOS 01/08/2006  . FIBROCYSTIC BREAST DISEASE 01/08/2006  . Iatan DISEASE, CERVICAL 01/08/2006  . VERTIGO 01/08/2006    Past Medical History:  Diagnosis Date  . Anxiety  [facility]- [facility] due [facility] Low BP   . Arthritis   . Asthma    early teens only   . Cervical intraepithelial neoplasia grade 3 2002   s/p LEEP Rx repeat pap negative  . Cervical radiculopathy   . CML (chronic myelocytic leukemia) ([place]) 06/03/2018  . Fallopian tube disorder    right fallopian tube removed  . Gallstones 09/2015   On CT  . Headache    Migraines  . Hemorrhagic ovarian cyst 02/08/2016  . Hydrosalpinx    [facility]  . Hydrosalpinx 02/08/2016  . Interstitial  cystitis   . Neurological abnormality    Neg workup with MRI/MRA in 2007 WNL except decreased caliber MCA proximally, distal cavernous portion of left LCA which may represent true stenosis or technique on exam. Evaluated by Dr Linus Salmons.  . Palpitations   . Partial seizures (HCC)    questionable diag  . Pneumonia    walking pneumonia  . Polycystic ovary    multiple ovarian cysts removed  . S/P cervical discectomy    Dr Vertell Limber, anterior discectomy C5-C7  . S/P epidural steroid injection    Right hip  . Seizures ([place]) 2005   simple partial seizure disorder-was mast cell problems per pt 09-15-2019- no seizures per pt     Past Surgical History:  Procedure Laterality Date  . APPENDECTOMY  1986  . BREAST BIOPSY Right 09/2019  . BUNIONECTOMY Left    bunion removal  . CERVICAL DISC SURGERY  2005   C5-C7  . COLONOSCOPY    . core breast biopsy     . DENTAL SURGERY    . fallopian tue removed    . LAPAROSCOPIC BILATERAL SALPINGECTOMY Left 02/09/2016   Procedure: LAPAROSCOPIC LEFT  SALPINGECTOMY;  Surgeon: Janyth Contes, MD;  Location: [facility];  Service: Gynecology;  Laterality: Left;  . LAPAROSCOPIC LYSIS OF ADHESIONS  02/09/2016   Procedure: LAPAROSCOPIC LYSIS OF ADHESIONS;  Surgeon: Janyth Contes, MD;  Location: [facility];  Service: Gynecology;;  momentum to adenexa  . LAPAROSCOPIC OVARIAN CYSTECTOMY Right 02/09/2016   Procedure: LAPAROSCOPIC OVARIAN CYSTECTOMY;  Surgeon: Janyth Contes, MD;  Location: [facility];  Service: Gynecology;  Laterality: Right;  x2 to right ovary  . LEEP  1998  . OOPHORECTOMY Left 02/09/2016   Procedure: LEFT OOPHORECTOMY;  Surgeon: Janyth Contes, MD;  Location: [facility];  Service: Gynecology;  Laterality: Left;- pt states left ovary has NOT been removed   . POLYPECTOMY    . TONSILLECTOMY  2001  . UPPER GASTROINTESTINAL ENDOSCOPY    . UPPER GI ENDOSCOPY      Social History   Tobacco Use  . Smoking status: Former Smoker    Packs/day: 0.00     Years: 0.00    Pack years: 0.00    Types: Cigarettes    Quit date: 11/02/2015    Years since quitting: 4.7  . Smokeless tobacco: Never Used  . Tobacco comment: currently smoking, began at beginning of June and plans to quit on June 14th. Has smoked off and on in the past.  Vaping Use  . Vaping Use: Every day  Substance Use Topics  . Alcohol use: Yes    Alcohol/week: 1.0 - 2.0 standard drink    Types: 1 - 2 Standard drinks or equivalent per week    Comment: occasionally 0-2 per day- rare   . Drug use: No    Family History  Adopted: Yes  Problem Relation Age of Onset  . COPD Mother   .  Breast cancer Mother   . Other Mother        Teeth problems- all removed by age 50  . Rashes / Skin problems Sister   . Other Sister        Teeth problems- all removed by age 35  . Rashes / Skin problems Brother   . Asthma Brother   . Other Brother        Teeth problems- all removed by age 79  . Breast cancer Maternal Aunt   . Adrenal disorder Neg Hx   . Colon cancer Neg Hx   . Colon polyps Neg Hx   . Esophageal cancer Neg Hx   . Stomach cancer Neg Hx   . Rectal cancer Neg Hx     Allergies  Allergen Reactions  . Clindamycin/Lincomycin Itching and Other (See Comments)    Dizziness, trouble swallowing  . Codeine Itching and Nausea And Vomiting  . Cyclobenzaprine Other (See Comments)    Fast heartbeat, restless leg, nightmares  . Gabapentin Other (See Comments)    Headaches, visual disturbances.   . Nyquil Multi-Symptom [Pseudoeph-Doxylamine-Dm-Apap] Other (See Comments)    Heart racing, dizziness, weakness  . Uribel [Meth-Hyo-M Bl-Na Phos-Ph Sal] Other (See Comments)    Heart racing, dizziness, blurry vision.  . Xanax [Alprazolam]     Bruising  . Urelle Nausea And Vomiting    Medication list has been reviewed and updated.  Current Outpatient Medications on File Prior to Visit  Medication Sig Dispense Refill  . Ascorbic Acid (VITAMIN C) 100 MG tablet Take 100 mg by mouth daily.     . cholecalciferol (VITAMIN D3) 25 MCG (1000 UNIT) tablet Take 2,000 Units by mouth once a week.    . clonazePAM (KLONOPIN) 0.5 MG tablet Take 1 tablet (0.5 mg total) by mouth 2 (two) times daily as needed for anxiety. 20 tablet 1  . famotidine (PEPCID) 20 MG tablet Take 10 mg by mouth daily.    Marland Kitchen imatinib (GLEEVEC) 100 MG tablet TAKE 2 TABLETS BY MOUTH 1 TIME DAILY AT THE SAME TIME WITH FOOD AND A LARGE GLASS OF WATER. AVOID GRAPEFRUIT PRODUCTS. 60 tablet 2  . loratadine (CLARITIN) 10 MG tablet Take 10 mg by mouth daily.    . traMADol (ULTRAM) 50 MG tablet Take 1 tablet (50 mg total) by mouth every 6 (six) hours as needed for up to 5 days. 25 tablet 0   No current facility-administered medications on file prior to visit.    Review of Systems:  As per HPI- otherwise negative.   Physical Examination: Vitals:   07/22/20 1153  BP: 126/70  Pulse: 100  Resp: 18  Temp: (!) 97.2 F (36.2 C)  SpO2: 100%   Vitals:   07/22/20 1153  Weight: 198 lb (89.8 kg)  Height: 5\' 6"  (1.676 m)   Body mass index is 31.96 kg/m. Ideal Body Weight: Weight in (lb) to have BMI = 25: 154.6  GEN: no acute distress.  Overweight, otherwise looks well HEENT: Atraumatic, Normocephalic.  Ears and Nose: No external deformity. CV: RRR, No M/G/R. No JVD. No thrill. No extra heart sounds. PULM: CTA B, no wheezes, crackles, rhonchi. No retractions. No resp. distress. No accessory muscle use. ABD: S, NT, ND, +BS. No rebound. No HSM. EXTR: No c/c/e PSYCH: Normally interactive. Conversant.  Moving more slowly than usual today due to hip pain The patient has generalized tenderness in her right hip to palpation both anteriorly and laterally.  She has some discomfort with flexed abduction  and abduction, but notes this is much better than it was yesterday.  No discomfort with internal or external rotation of the hip joint.  There is no redness or heat about the joint Victoria Hall also mentions that she sees a fullness in both  of her underarms, this seems to wax and wane and is symmetrical bilaterally.  It is not generally tender  Assessment and Plan: Pain in right hip - Plan: DG HIP UNILAT W OR W/O PELVIS 2-3 VIEWS RIGHT  Axillary swelling - Plan: MM DIAG BREAST TOMO BILATERAL, US BREAST COMPLETE UNI RIGHT INC AXILLA, US BREAST COMPLETE UNI LEFT INC AXILLA  Victoria Hall is here today with concern of pain in her right hip, this may be connected to a new generic of imatinib which she took just recently for her CML.  She was having quite severe hip pain yesterday, she is still having discomfort although improved.  We will obtain plain films today, then we will follow-up pending possibility of AVN  She also has noticed swelling of her bilateral axillary.  I have ordered diagnostic mammogram and bilateral ultrasound to further evaluate this concern This visit occurred during the SARS-CoV-2 public health emergency.  Safety protocols were in place, including screening questions prior to the visit, additional usage of staff PPE, and extensive cleaning of exam room while observing appropriate contact time as indicated for disinfecting solutions.    Signed Lamar Blinks, MD  Received her hip films as follows, message to patient  DG HIP UNILAT W OR W/O PELVIS 2-3 VIEWS RIGHT  Result Date: 07/22/2020 CLINICAL DATA:  Hip pain EXAM: DG HIP (WITH OR WITHOUT PELVIS) 2-3V RIGHT COMPARISON:  12/01/2015 FINDINGS: There is no evidence of hip fracture or dislocation. Progressive right hip joint space narrowing with tiny marginal osteophyte formation and subchondral cystic change. IMPRESSION: Progressive mild-moderate right hip osteoarthritis. Electronically Signed   By: Davina Poke D.O.   On: 07/22/2020 13:04

## 2020-07-23 ENCOUNTER — Other Ambulatory Visit: Payer: Self-pay | Admitting: Hematology and Oncology

## 2020-07-24 ENCOUNTER — Encounter: Payer: Self-pay | Admitting: Hematology and Oncology

## 2020-07-25 LAB — COPPER, SERUM: Copper: 71 ug/dL — ABNORMAL LOW (ref 80–158)

## 2020-07-27 ENCOUNTER — Telehealth: Payer: Self-pay

## 2020-07-27 NOTE — Telephone Encounter (Signed)
Oral Oncology Patient Advocate Encounter  Received notification from [place] that prior authorization for [place] (brand name) is required.  PA submitted on CoverMyMeds Key [id number] Status is pending  Oral Oncology Clinic will continue to follow.  [place] Patient [place] Phone [phone] Fax [phone] 07/27/2020 2:25 PM

## 2020-07-27 NOTE — Telephone Encounter (Signed)
Oral Oncology Patient Advocate Encounter  Received notification from [place] that the request for prior authorization for [place] (brand name) has been denied.    This determination is currently being appealed.  The appeal packet was faxed to [phone].   This encounter will continue to be updated until final determination.    [place] Patient [place] Phone [phone] Fax [phone] 07/27/2020 2:26 PM

## 2020-08-04 ENCOUNTER — Other Ambulatory Visit (HOSPITAL_COMMUNITY): Payer: Self-pay

## 2020-08-05 ENCOUNTER — Other Ambulatory Visit: Payer: Self-pay

## 2020-08-05 DIAGNOSIS — C9210 Chronic myeloid leukemia, BCR/ABL-positive, not having achieved remission: Secondary | ICD-10-CM

## 2020-08-05 MED ORDER — IMATINIB MESYLATE 100 MG PO TABS: ORAL_TABLET | ORAL | 2 refills | Status: DC

## 2020-08-26 ENCOUNTER — Other Ambulatory Visit: Payer: Self-pay

## 2020-08-26 ENCOUNTER — Inpatient Hospital Stay: Payer: 59 | Attending: Family

## 2020-08-26 ENCOUNTER — Other Ambulatory Visit: Payer: Self-pay | Admitting: Hematology

## 2020-08-26 DIAGNOSIS — C9210 Chronic myeloid leukemia, BCR/ABL-positive, not having achieved remission: Secondary | ICD-10-CM | POA: Diagnosis not present

## 2020-08-26 DIAGNOSIS — E559 Vitamin D deficiency, unspecified: Secondary | ICD-10-CM

## 2020-08-26 LAB — CBC WITH DIFFERENTIAL (CANCER CENTER ONLY)
Abs Immature Granulocytes: 0.01 10*3/uL (ref 0.00–0.07)
Basophils Absolute: 0.1 10*3/uL (ref 0.0–0.1)
Basophils Relative: 1 %
Eosinophils Absolute: 0.3 10*3/uL (ref 0.0–0.5)
Eosinophils Relative: 5 %
HCT: 39.1 % (ref 36.0–46.0)
Hemoglobin: 13.6 g/dL (ref 12.0–15.0)
Immature Granulocytes: 0 %
Lymphocytes Relative: 31 %
Lymphs Abs: 2 10*3/uL (ref 0.7–4.0)
MCH: 29.9 pg (ref 26.0–34.0)
MCHC: 34.8 g/dL (ref 30.0–36.0)
MCV: 85.9 fL (ref 80.0–100.0)
Monocytes Absolute: 0.4 10*3/uL (ref 0.1–1.0)
Monocytes Relative: 6 %
Neutro Abs: 3.7 10*3/uL (ref 1.7–7.7)
Neutrophils Relative %: 57 %
Platelet Count: 288 10*3/uL (ref 150–400)
RBC: 4.55 MIL/uL (ref 3.87–5.11)
RDW: 12.8 % (ref 11.5–15.5)
WBC Count: 6.5 10*3/uL (ref 4.0–10.5)
nRBC: 0 % (ref 0.0–0.2)

## 2020-08-26 LAB — CMP (CANCER CENTER ONLY)
ALT: 39 U/L (ref 0–44)
AST: 27 U/L (ref 15–41)
Albumin: 4 g/dL (ref 3.5–5.0)
Alkaline Phosphatase: 75 U/L (ref 38–126)
Anion gap: 12 (ref 5–15)
BUN: 10 mg/dL (ref 6–20)
CO2: 26 mmol/L (ref 22–32)
Calcium: 9.6 mg/dL (ref 8.9–10.3)
Chloride: 104 mmol/L (ref 98–111)
Creatinine: 1 mg/dL (ref 0.44–1.00)
GFR, Estimated: >60 mL/min (ref >60)
Glucose, Bld: 116 mg/dL — ABNORMAL HIGH (ref 70–99)
Potassium: 3.8 mmol/L (ref 3.5–5.1)
Sodium: 142 mmol/L (ref 135–145)
Total Bilirubin: 0.4 mg/dL (ref 0.3–1.2)
Total Protein: 7.1 g/dL (ref 6.5–8.1)

## 2020-08-26 LAB — VITAMIN D 25 HYDROXY (VIT D DEFICIENCY, FRACTURES): Vit D, 25-Hydroxy: 34.93 ng/mL (ref 30–100)

## 2020-08-26 LAB — MAGNESIUM: Magnesium: 1.8 mg/dL (ref 1.7–2.4)

## 2020-08-27 LAB — COPPER, SERUM: Copper: 71 ug/dL — ABNORMAL LOW (ref 80–158)

## 2020-09-02 LAB — BCR/ABL

## 2020-09-07 ENCOUNTER — Other Ambulatory Visit: Payer: Self-pay | Admitting: Family Medicine

## 2020-09-07 DIAGNOSIS — R921 Mammographic calcification found on diagnostic imaging of breast: Secondary | ICD-10-CM

## 2020-09-07 DIAGNOSIS — M7989 Other specified soft tissue disorders: Secondary | ICD-10-CM

## 2020-09-10 ENCOUNTER — Other Ambulatory Visit: Payer: 59

## 2020-09-10 ENCOUNTER — Ambulatory Visit
Admission: RE | Admit: 2020-09-10 | Discharge: 2020-09-10 | Disposition: A | Discharge status: Home / Self Care | Payer: 59 | Source: Ambulatory Visit | Attending: Family Medicine | Admitting: Family Medicine

## 2020-09-10 ENCOUNTER — Other Ambulatory Visit: Payer: Self-pay

## 2020-09-10 DIAGNOSIS — M7989 Other specified soft tissue disorders: Secondary | ICD-10-CM

## 2020-09-10 DIAGNOSIS — R921 Mammographic calcification found on diagnostic imaging of breast: Secondary | ICD-10-CM

## 2020-09-27 ENCOUNTER — Telehealth: Payer: Self-pay | Admitting: Hematology

## 2020-09-27 ENCOUNTER — Inpatient Hospital Stay: Payer: 59 | Admitting: Hematology

## 2020-09-27 NOTE — Telephone Encounter (Signed)
Rescheduled today's appointment due to provider not in office. Patient is aware of changes and stated her oral surgeon had been trying to reach Dr. Irene Limbo to discuss upcoming surgery.

## 2020-09-30 NOTE — Progress Notes (Signed)
HEMATOLOGY/ONCOLOGY CLINIC NOTE  Date of Service: 10/01/2020  Patient Care Team: Copland, Gay Filler, MD as PCP - General (Family Medicine)  CHIEF COMPLAINTS/PURPOSE OF CONSULTATION:  Chronic Myeloid Leukemia  HISTORY OF PRESENTING ILLNESS:   Victoria Hall is a wonderful 50 y.o. female who has been referred to [place] by Dr. Silvestre Mesi for evaluation and management of CML. The pt was formerly under the care of my colleague Dr. Burney Gauze, and has transferred her care closer to home. The pt reports that she is doing well overall.  Prior to today's visit, the pt was recently diagnosed with CML after a 05/16/18 bone marrow biopsy revealed a translocation 9;22 and FISH study revealed BCR-ABL gene rearranagement in 85% of cells. The pt was prescribed $RemoveBefore'400mg'AMArKpvEqolha$  Imatinib with Dr. Marin Olp on 06/03/18.  The pt reports that she had intermittent blurry vision for the last 4 years. She notes that she had night sweats for the last 20 years. She notes that her WBC have been elevated for the last 4 years. She notes that she has had elevated Basophils since April 2018. She has been evaluated for mast cell activation, and has had normal tryptase levels, including most recently on 06/03/18.  She reports that she went to [facility] in October 2019 for suspicions of dysautonomia. The pt notes that she has "exercise intolerance," for the last 4 years, which she describes as needing to "lie on the couch for 2 weeks," after taking a run.  She has had intermittent rashes and hives for the last 20 years, which do resolve on their own. She keeps a food and activity journal for the last 2 years, and denies awareness of any associations with her rash onset. She has seen dermatology and immunology regarding this. The pt has also seen a rheumatologist for evaluation.  The pt notes that her hands and feet frequently fall asleep, and has been evaluated by neurology with an NCV. She notes that she has cervical  radiculopathy which she relates to her 2005 C5-7 fusion. She endorses exhaustion which "comes on out of no where." She notes her energy levels change "from minute to minute."   The pt denies abdominal pains or mouth sores.   The pt endorse frequent muscle and joint cramping as well. She has tried using CBD oil which she found to be helpful, but notes that the cost became prohibitive.  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  Most recent lab results (06/03/18) of CBC w/diff and CMP is as follows: all values are WNL except for WBC at 18.8k, ANC at 12.9k Basophils abs at 600, Abs immature granulocytes at 1.02k 06/24/18 LDH at 237  On review of systems, pt reports intermittent sudden-onset fatigue, intermittent headaches, intermittent hives and rashes, muscle and joint cramping, and denies abdominal pains, mouth sores, leg swelling, and any other symptoms.   On PMHx the pt reports chronic cervical radiculopathy with fusion of C5-7 in 2005. 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 She endorses concern for chemical exposure related to her hobbies of painting and furniture restoration.  Interval History:  I connected with Arne Cleveland on 10/01/2020 by telephone and verified that I am speaking with the correct person using two identifiers.   I discussed the limitations of evaluation and management by telemedicine. The patient expressed understanding and agreed to proceed.   Other persons participating in the visit  and their role in the encounter:                                                         - Reinaldo Raddle, Medical Scribe     Patient's location: Home Provider's location: [place] is a wonderful 50 y.o. female who is here for evaluation and management of CML. The patient's last visit with [place] was on 06/17/2020. The pt reports that she is doing well  overall.  The pt reports that she has been improving since returning to the preparation [facility]) she had prior been on before switching. The spasms are much improved, as well as the cramping. She notes her fatigue is much improved as well. She started having issues with toleration of the copper due to stomach issues and cramps in her torso and neck that were acute. She notes an irregular heartbeat when on copper. The patient notes she read online that she needs to raise her magnesium levels prior to copper. Thus, she has not been taking her copper as of late.   The patient notes that her oral surgeon is attempting to do a complete retraction of her upper teeth. They have been trying to contact for a few weeks and plan on doing the procedure once approval received. They wish to do this within the next two weeks. She will be put under general anesthesia. The patient expressed her concerns with some anesthesia that counteracts with Gleevac.  Lab results today 08/26/2020 of CBC w/diff and CMP is as follows: all values are WNL except for Glucose of 116. 08/26/2020 Vit D 25 Hydroxy of 34.93. 08/26/2020 Copper of 71. 08/26/2020 Magnesium of 1.8. 08/26/2020 BCR-ABL of .005%.  On review of systems, pt reports intermittent muscle cramps and denies muscle spasms, nausea, fatigue, and any other symptoms.   MEDICAL HISTORY:  Past Medical History:  Diagnosis Date   Anxiety    Past hx- [facility] due to Low BP    Arthritis    Asthma    early teens only    Cervical intraepithelial neoplasia grade 3 2002   s/p LEEP Rx repeat pap negative   Cervical radiculopathy    CML (chronic myelocytic leukemia) ([place]) 06/03/2018   Fallopian tube disorder    right fallopian tube removed   Gallstones 09/2015   On CT   Headache    Migraines   Hemorrhagic ovarian cyst 02/08/2016   Hydrosalpinx    [facility]   Hydrosalpinx 02/08/2016   Interstitial cystitis    Neurological abnormality     Neg workup with MRI/MRA in 2007 WNL except decreased caliber MCA proximally, distal cavernous portion of left LCA which may represent true stenosis or technique on exam. Evaluated by Dr Linus Salmons.   Palpitations    Partial seizures (HCC)    questionable diag   Pneumonia    walking pneumonia   Polycystic ovary    multiple ovarian cysts removed   S/P cervical discectomy    Dr Vertell Limber, anterior discectomy C5-C7   S/P epidural steroid injection    Right hip   Seizures ([place]) 2005   simple partial seizure disorder-was mast cell problems per pt 09-15-2019- no seizures per pt     SURGICAL HISTORY: Past Surgical History:  Procedure Laterality Date  APPENDECTOMY  1986   BREAST BIOPSY Right 09/2019   BUNIONECTOMY Left    bunion removal   CERVICAL DISC SURGERY  2005   C5-C7   COLONOSCOPY     core breast biopsy      DENTAL SURGERY     fallopian tue removed     LAPAROSCOPIC BILATERAL SALPINGECTOMY Left 02/09/2016   Procedure: LAPAROSCOPIC LEFT  SALPINGECTOMY;  Surgeon: Sherian Rein, MD;  Location: [facility];  Service: Gynecology;  Laterality: Left;   LAPAROSCOPIC LYSIS OF ADHESIONS  02/09/2016   Procedure: LAPAROSCOPIC LYSIS OF ADHESIONS;  Surgeon: Sherian Rein, MD;  Location: [facility];  Service: Gynecology;;  momentum to adenexa   LAPAROSCOPIC OVARIAN CYSTECTOMY Right 02/09/2016   Procedure: LAPAROSCOPIC OVARIAN CYSTECTOMY;  Surgeon: Sherian Rein, MD;  Location: [facility];  Service: Gynecology;  Laterality: Right;  x2 to right ovary   LEEP  1998   OOPHORECTOMY Left 02/09/2016   Procedure: LEFT OOPHORECTOMY;  Surgeon: Sherian Rein, MD;  Location: [facility];  Service: Gynecology;  Laterality: Left;- pt states left ovary has NOT been removed    POLYPECTOMY     TONSILLECTOMY  2001   UPPER GASTROINTESTINAL ENDOSCOPY     UPPER GI ENDOSCOPY      SOCIAL HISTORY: Social History   Socioeconomic History   Marital status: Married    Spouse name: Not on file   Number of children:  0   Years of education: Not on file   Highest education level: Not on file  Occupational History   Occupation: novelist    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   Occupation: Runner, broadcasting/film/video   Occupation: Education administrator  Tobacco Use   Smoking status: Former    Packs/day: 0.00    Years: 0.00    Pack years: 0.00    Types: Cigarettes    Quit date: 11/02/2015    Years since quitting: 4.9   Smokeless tobacco: Never   Tobacco comments:    currently smoking, began at beginning of June and plans to quit on June 14th. Has smoked off and on in the past.  Vaping Use   Vaping Use: Every day  Substance and Sexual Activity   Alcohol use: Yes    Alcohol/week: 1.0 - 2.0 standard drink    Types: 1 - 2 Standard drinks or equivalent per week    Comment: occasionally 0-2 per day- rare    Drug use: No   Sexual activity: Yes  Other Topics Concern   Not on file  Social History Narrative   Not on file   Social Determinants of Health   Financial Resource Strain: Not on file  Food Insecurity: Not on file  Transportation Needs: Not on file  Physical Activity: Not on file  Stress: Not on file  Social Connections: Not on file  Intimate Partner Violence: Not on file    FAMILY HISTORY: Family History  Adopted: Yes  Problem Relation Age of Onset   COPD Mother    Breast cancer Mother    Other Mother        Teeth problems- all removed by age 61   Rashes / Skin problems Sister    Other Sister        Teeth problems- all removed by age 94   Rashes / Skin problems Brother    Asthma Brother    Other Brother        Teeth problems- all removed by age 18   Breast cancer Maternal Aunt  Adrenal disorder Neg Hx    Colon cancer Neg Hx    Colon polyps Neg Hx    Esophageal cancer Neg Hx    Stomach cancer Neg Hx    Rectal cancer Neg Hx     ALLERGIES:  is allergic to clindamycin/lincomycin, codeine, cyclobenzaprine, gabapentin, nyquil multi-symptom  [pseudoeph-doxylamine-dm-apap], uribel [meth-hyo-m bl-na phos-ph sal], xanax [alprazolam], and urelle.  MEDICATIONS:  Current Outpatient Medications  Medication Sig Dispense Refill   Ascorbic Acid (VITAMIN C) 100 MG tablet Take 100 mg by mouth daily.     cholecalciferol (VITAMIN D3) 25 MCG (1000 UNIT) tablet Take 2,000 Units by mouth once a week.     clonazePAM (KLONOPIN) 0.5 MG tablet Take 1 tablet (0.5 mg total) by mouth 2 (two) times daily as needed for anxiety. 20 tablet 1   Copper Gluconate (COPPER CAPS) 2 MG CAPS Take 2 mg by mouth as directed. 80 capsule 0   famotidine (PEPCID) 20 MG tablet Take 10 mg by mouth daily.     imatinib (GLEEVEC) 100 MG tablet TAKE 2 TABLETS BY MOUTH 1 TIME DAILY AT THE SAME TIME WITH FOOD AND A LARGE GLASS OF WATER. AVOID GRAPEFRUIT PRODUCTS. 60 tablet 2   loratadine (CLARITIN) 10 MG tablet Take 10 mg by mouth daily.     magnesium oxide (MAG-OX) 400 (241.3 Mg) MG tablet Take 1 tablet (400 mg total) by mouth daily. 30 tablet 3   Oyster Shell Calcium 500 MG TABS Please specify directions, refills and quantity 30 tablet 2   No current facility-administered medications for this visit.    REVIEW OF SYSTEMS:   10 Point review of Systems was done is negative except as noted above.   PHYSICAL EXAMINATION: There were no vitals filed for this visit. Wt Readings from Last 3 Encounters:  07/22/20 198 lb 6.4 oz (90 kg)  07/22/20 198 lb (89.8 kg)  04/28/20 195 lb (88.5 kg)   There is no height or weight on file to calculate BMI.     Telehealth Visit.   LABORATORY DATA:  I have reviewed the data as listed   CBC Latest Ref Rng & Units 08/26/2020 07/22/2020 05/27/2020  WBC 4.0 - 10.5 K/uL 6.5 7.2 8.4  Hemoglobin 12.0 - 15.0 g/dL 13.6 12.9 14.0  Hematocrit 36.0 - 46.0 % 39.1 37.7 41.4  Platelets 150 - 400 K/uL 288 277 316    CBC    Component Value Date/Time   WBC 6.5 08/26/2020 0746   WBC 8.4 05/27/2020 1031   RBC 4.55 08/26/2020 0746   HGB 13.6  08/26/2020 0746   HGB 15.1 03/26/2017 1101   HCT 39.1 08/26/2020 0746   HCT 44.0 03/26/2017 1101   PLT 288 08/26/2020 0746   PLT 330 03/26/2017 1101   MCV 85.9 08/26/2020 0746   MCV 84 03/26/2017 1101   MCH 29.9 08/26/2020 0746   MCHC 34.8 08/26/2020 0746   RDW 12.8 08/26/2020 0746   RDW 14.0 03/26/2017 1101   LYMPHSABS 2.0 08/26/2020 0746   LYMPHSABS 2.8 03/26/2017 1101   MONOABS 0.4 08/26/2020 0746   EOSABS 0.3 08/26/2020 0746   EOSABS 0.4 03/26/2017 1101   BASOSABS 0.1 08/26/2020 0746   BASOSABS 0.2 03/26/2017 1101     . CMP Latest Ref Rng & Units 08/26/2020 07/22/2020 05/27/2020  Glucose 70 - 99 mg/dL 116(H) 112(H) 92  BUN 6 - 20 mg/dL $Remove'10 10 11  'imrtzDp$ Creatinine 0.44 - 1.00 mg/dL 1.00 0.98 1.06(H)  Sodium 135 - 145 mmol/L 142 142 140  Potassium 3.5 - 5.1 mmol/L 3.8 4.0 4.3  Chloride 98 - 111 mmol/L 104 105 105  CO2 22 - 32 mmol/L 26 28 26   Calcium 8.9 - 10.3 mg/dL 9.6 9.4 9.4  Total Protein 6.5 - 8.1 g/dL 7.1 7.0 7.5  Total Bilirubin 0.3 - 1.2 mg/dL 0.4 0.3 0.4  Alkaline Phos 38 - 126 U/L 75 65 73  AST 15 - 41 U/L 27 24 25   ALT 0 - 44 U/L 39 33 41   03/04/2020 BCR/ABL:   01/07/2020 BCR/ABL:   10/15/2019 BCR/ABL:    05/21/2019 BCR/ABL:    03/26/2019 BCR/ABL:    01/28/2019 FISH:   07/25/18 Initial BCR-ABL:    06/03/18 FISH:   05/16/18 Cytogenetics:   04/10/18 Hemochromatosis Panel:    RADIOGRAPHIC STUDIES: I have personally reviewed the radiological images as listed and agreed with the findings in the report. MM DIAG BREAST TOMO BILATERAL  Result Date: 09/10/2020 CLINICAL DATA:  50 year old with personal history of chronic myelocytic leukemia, presenting with BILATERAL axillary swelling. Annual mammographic evaluation. Family history of breast cancer in her mother and her maternal aunt. The patient states that she has gained 40 pounds since beginning her therapy for CML. EXAM: DIGITAL DIAGNOSTIC BILATERAL MAMMOGRAM WITH TOMOSYNTHESIS AND CAD; 11/10/2020 AXILLARY  LEFT; 44 AXILLARY RIGHT TECHNIQUE: Bilateral digital diagnostic mammography and breast tomosynthesis was performed. The images were evaluated with computer-aided detection.; Targeted ultrasound examination of the left axilla was performed.; Targeted ultrasound examination of the right axilla was performed. COMPARISON:  Previous exam(s). ACR Breast Density Category c: The breast tissue is heterogeneously dense, which may obscure small masses. FINDINGS: Full field CC and MLO views of both breasts were obtained. RIGHT: No findings suspicious for malignancy. The visualized lymph nodes in the low axilla on the MLO view are normal in appearance. Targeted ultrasound of the RIGHT axilla in the area of concern is performed, demonstrating a prominent axillary fat pad. There is no pathologic lymphadenopathy. LEFT: No findings suspicious for malignancy. The visualized lymph nodes in the low axilla on the MLO view are normal in appearance. Targeted ultrasound the LEFT axilla in the area of concern is performed, demonstrating a prominent axillary fat pad. There is no pathologic. On correlative physical examination, the axillary fat pads are somewhat prominent. There is no palpable mass or lymphadenopathy. IMPRESSION: 1. No mammographic evidence of malignancy involving either breast. 2. No pathologic lymphadenopathy in either axilla. 3. Prominent axillary fat pads bilaterally which account for the clinical concern of axillary swelling. RECOMMENDATION: Screening mammogram in one year.(Code:SM-B-01Y) I have discussed the findings and recommendations with the patient. If applicable, a reminder letter will be sent to the patient regarding the next appointment. BI-RADS CATEGORY  2: Benign. Electronically Signed   By: [place] M.D.   On: 09/10/2020 15:01   Hulan Saas AXILLA LEFT  Result Date: 09/10/2020 CLINICAL DATA:  50 year old with personal history of chronic myelocytic leukemia, presenting with BILATERAL axillary swelling.  Annual mammographic evaluation. Family history of breast cancer in her mother and her maternal aunt. The patient states that she has gained 40 pounds since beginning her therapy for CML. EXAM: DIGITAL DIAGNOSTIC BILATERAL MAMMOGRAM WITH TOMOSYNTHESIS AND CAD; 11/10/2020 AXILLARY LEFT; 44 AXILLARY RIGHT TECHNIQUE: Bilateral digital diagnostic mammography and breast tomosynthesis was performed. The images were evaluated with computer-aided detection.; Targeted ultrasound examination of the left axilla was performed.; Targeted ultrasound examination of the right axilla was performed. COMPARISON:  Previous exam(s). ACR Breast Density Category c: The breast tissue is heterogeneously dense,  which may obscure small masses. FINDINGS: Full field CC and MLO views of both breasts were obtained. RIGHT: No findings suspicious for malignancy. The visualized lymph nodes in the low axilla on the MLO view are normal in appearance. Targeted ultrasound of the RIGHT axilla in the area of concern is performed, demonstrating a prominent axillary fat pad. There is no pathologic lymphadenopathy. LEFT: No findings suspicious for malignancy. The visualized lymph nodes in the low axilla on the MLO view are normal in appearance. Targeted ultrasound the LEFT axilla in the area of concern is performed, demonstrating a prominent axillary fat pad. There is no pathologic. On correlative physical examination, the axillary fat pads are somewhat prominent. There is no palpable mass or lymphadenopathy. IMPRESSION: 1. No mammographic evidence of malignancy involving either breast. 2. No pathologic lymphadenopathy in either axilla. 3. Prominent axillary fat pads bilaterally which account for the clinical concern of axillary swelling. RECOMMENDATION: Screening mammogram in one year.(Code:SM-B-01Y) I have discussed the findings and recommendations with the patient. If applicable, a reminder letter will be sent to the patient regarding the next appointment. BI-RADS  CATEGORY  2: Benign. Electronically Signed   By: Evangeline Dakin M.D.   On: 09/10/2020 15:01  [place] AXILLA RIGHT  Result Date: 09/10/2020 CLINICAL DATA:  50 year old with personal history of chronic myelocytic leukemia, presenting with BILATERAL axillary swelling. Annual mammographic evaluation. Family history of breast cancer in her mother and her maternal aunt. The patient states that she has gained 40 pounds since beginning her therapy for CML. EXAM: DIGITAL DIAGNOSTIC BILATERAL MAMMOGRAM WITH TOMOSYNTHESIS AND CAD; [place] AXILLARY LEFT; [place] AXILLARY RIGHT TECHNIQUE: Bilateral digital diagnostic mammography and breast tomosynthesis was performed. The images were evaluated with computer-aided detection.; Targeted ultrasound examination of the left axilla was performed.; Targeted ultrasound examination of the right axilla was performed. COMPARISON:  Previous exam(s). ACR Breast Density Category c: The breast tissue is heterogeneously dense, which may obscure small masses. FINDINGS: Full field CC and MLO views of both breasts were obtained. RIGHT: No findings suspicious for malignancy. The visualized lymph nodes in the low axilla on the MLO view are normal in appearance. Targeted ultrasound of the RIGHT axilla in the area of concern is performed, demonstrating a prominent axillary fat pad. There is no pathologic lymphadenopathy. LEFT: No findings suspicious for malignancy. The visualized lymph nodes in the low axilla on the MLO view are normal in appearance. Targeted ultrasound the LEFT axilla in the area of concern is performed, demonstrating a prominent axillary fat pad. There is no pathologic. On correlative physical examination, the axillary fat pads are somewhat prominent. There is no palpable mass or lymphadenopathy. IMPRESSION: 1. No mammographic evidence of malignancy involving either breast. 2. No pathologic lymphadenopathy in either axilla. 3. Prominent axillary fat pads bilaterally which account for the  clinical concern of axillary swelling. RECOMMENDATION: Screening mammogram in one year.(Code:SM-B-01Y) I have discussed the findings and recommendations with the patient. If applicable, a reminder letter will be sent to the patient regarding the next appointment. BI-RADS CATEGORY  2: Benign. Electronically Signed   By: Evangeline Dakin M.D.   On: 09/10/2020 15:01   01/31/18 ECHO Transthoracic:   06/03/2020 BCR-ABL PCR   08/26/2020 BCR-ABL   ASSESSMENT & PLAN:   50 y.o. female with  1. CML- Chronic phase 05/16/18 BM Cytogenetics revealed translocation 9;22. 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 Mild copper deficiency  PLAN: -Discussed pt labwork, 08/26/2020; still copper deficient, magnesium normal, blood counts and chemistries normal. -Advised pt that she is still in major molecular remission. -Recommended pt continue with same dosage @ $Remove'200mg'fBVhOIe$  po daily of Gleevec at this time. -Discussed holding Imatinib for 1 week prior and 1 week after significant dental procedure. -Advised pt we will follow up with oral surgeon and give our recommendations for this so they can plan accordingly. -Advised pt that we will keep Imatinib dose @ 200 mg po daily due to known occurred toxicities from dosage at 300 mg. -The pt has no prohibitive toxicities from continuing 200 mg Imatinib at this time. -Recommended pt restart 2 mg Copper on M,W,F.  -Continue 5000 IU Vitamin D daily. -Continue daily antihistamines.   -Will see back in 3 months with labs 2 weeks prior.   FOLLOW UP: RTC w Dr Irene Limbo in 3 months. Please schedule labs 2 weeks prior.    The total time spent in the appointment was 30 minutes and more than 50% was on counseling and direct patient cares.   All of the patient's questions were answered with apparent satisfaction. The patient knows to call the clinic with any problems, questions or concerns.   Sullivan Lone MD [place] AAHIVMS [facility] Hematology/Oncology Physician [facility]  (Office):       [phone] (Work cell):  [phone] (Fax):           [phone]  10/01/2020 2:02 PM  I, Reinaldo Raddle, am acting as scribe for Dr. Sullivan Lone, MD.    .I have reviewed the above documentation for accuracy and completeness, and I agree with the above. Brunetta Genera MD

## 2020-10-01 ENCOUNTER — Other Ambulatory Visit: Payer: Self-pay

## 2020-10-01 ENCOUNTER — Inpatient Hospital Stay: Payer: 59 | Attending: Family | Admitting: Hematology

## 2020-10-01 DIAGNOSIS — C9210 Chronic myeloid leukemia, BCR/ABL-positive, not having achieved remission: Secondary | ICD-10-CM | POA: Insufficient documentation

## 2020-10-01 DIAGNOSIS — E610 Copper deficiency: Secondary | ICD-10-CM

## 2020-10-01 NOTE — Progress Notes (Signed)
Per Dr Irene Limbo in reference to pt's dental extractions and questions from oral surgeon:  no absolute contraindication with those medications with Imatinib. Imatinib is a CYP3A4 moderate inhibitor and can increase levels of fentanyl and versed and could increase sedation --appropriate dose adjustments of these medications should be considered. if used together. Given plan for extensive dental extraction -- would recommend holding imatininb for 1 week prior and for 1-2 week after dental extraction to reduce excessive bleeding or delayed healing.  Will send above to Oral surgeon.

## 2020-10-06 ENCOUNTER — Telehealth: Payer: Self-pay | Admitting: Hematology

## 2020-10-06 NOTE — Telephone Encounter (Signed)
Left message with follow-up appointments per 7/1 los. 

## 2020-10-07 ENCOUNTER — Encounter: Payer: Self-pay | Admitting: Family

## 2020-10-14 NOTE — Progress Notes (Signed)
Contacted pt per her request. Pt asked about starting abx/amoxicillin pre oral surgery, pt wanted to make sure it was ok to take along with her Gleevic. Per Dr Irene Limbo the abx is compatible with the [facility]. Pt verbalized understanding.

## 2020-10-24 ENCOUNTER — Other Ambulatory Visit: Payer: Self-pay | Admitting: Hematology and Oncology

## 2020-11-03 ENCOUNTER — Other Ambulatory Visit: Payer: Self-pay | Admitting: Hematology and Oncology

## 2020-11-10 ENCOUNTER — Other Ambulatory Visit: Payer: Self-pay | Admitting: Hematology

## 2020-11-10 DIAGNOSIS — C9210 Chronic myeloid leukemia, BCR/ABL-positive, not having achieved remission: Secondary | ICD-10-CM

## 2020-11-15 ENCOUNTER — Encounter: Payer: Self-pay | Admitting: General Practice

## 2020-11-15 NOTE — Progress Notes (Signed)
[place] Spiritual Care Note  Received call from [facility] seeking suggestions for a counselor who specializes in chronic illness. Made three suggestions to explore and also encouraged her to reach out to [facility]/LCSW for additional suggestions if none is a good fit.   [place], [place], [facility] Pager [phone] Voicemail [phone]

## 2020-12-24 ENCOUNTER — Other Ambulatory Visit: Payer: Self-pay

## 2020-12-24 ENCOUNTER — Inpatient Hospital Stay: Payer: 59 | Attending: Family

## 2020-12-24 DIAGNOSIS — E610 Copper deficiency: Secondary | ICD-10-CM

## 2020-12-24 DIAGNOSIS — C9210 Chronic myeloid leukemia, BCR/ABL-positive, not having achieved remission: Secondary | ICD-10-CM | POA: Insufficient documentation

## 2020-12-24 LAB — CBC WITH DIFFERENTIAL/PLATELET
Abs Immature Granulocytes: 0.02 10*3/uL (ref 0.00–0.07)
Basophils Absolute: 0.1 10*3/uL (ref 0.0–0.1)
Basophils Relative: 1 %
Eosinophils Absolute: 0.3 10*3/uL (ref 0.0–0.5)
Eosinophils Relative: 4 %
HCT: 37.5 % (ref 36.0–46.0)
Hemoglobin: 13.2 g/dL (ref 12.0–15.0)
Immature Granulocytes: 0 %
Lymphocytes Relative: 33 %
Lymphs Abs: 2.6 10*3/uL (ref 0.7–4.0)
MCH: 30.1 pg (ref 26.0–34.0)
MCHC: 35.2 g/dL (ref 30.0–36.0)
MCV: 85.6 fL (ref 80.0–100.0)
Monocytes Absolute: 0.5 10*3/uL (ref 0.1–1.0)
Monocytes Relative: 6 %
Neutro Abs: 4.3 10*3/uL (ref 1.7–7.7)
Neutrophils Relative %: 56 %
Platelets: 279 10*3/uL (ref 150–400)
RBC: 4.38 MIL/uL (ref 3.87–5.11)
RDW: 12.8 % (ref 11.5–15.5)
WBC: 7.7 10*3/uL (ref 4.0–10.5)
nRBC: 0 % (ref 0.0–0.2)

## 2020-12-24 LAB — CMP (CANCER CENTER ONLY)
ALT: 32 U/L (ref 0–44)
AST: 27 U/L (ref 15–41)
Albumin: 4.1 g/dL (ref 3.5–5.0)
Alkaline Phosphatase: 75 U/L (ref 38–126)
Anion gap: 9 (ref 5–15)
BUN: 10 mg/dL (ref 6–20)
CO2: 27 mmol/L (ref 22–32)
Calcium: 9.5 mg/dL (ref 8.9–10.3)
Chloride: 105 mmol/L (ref 98–111)
Creatinine: 1.05 mg/dL — ABNORMAL HIGH (ref 0.44–1.00)
GFR, Estimated: >60 mL/min (ref >60)
Glucose, Bld: 109 mg/dL — ABNORMAL HIGH (ref 70–99)
Potassium: 4 mmol/L (ref 3.5–5.1)
Sodium: 141 mmol/L (ref 135–145)
Total Bilirubin: 0.5 mg/dL (ref 0.3–1.2)
Total Protein: 6.9 g/dL (ref 6.5–8.1)

## 2020-12-26 LAB — COPPER, SERUM: Copper: 80 ug/dL (ref 80–158)

## 2020-12-31 LAB — BCR/ABL

## 2021-01-07 ENCOUNTER — Inpatient Hospital Stay: Payer: 59 | Attending: Family | Admitting: Hematology

## 2021-01-07 NOTE — Progress Notes (Deleted)
Gleevec -- muscle cramps, fluid retention, abdominal bloating, peri-orbital swelling. Flushing , itching, joint pain, diarrhea --went away after Gleevec.  Rash- biopsied

## 2021-02-07 ENCOUNTER — Other Ambulatory Visit: Payer: Self-pay | Admitting: Hematology

## 2021-02-07 DIAGNOSIS — C9210 Chronic myeloid leukemia, BCR/ABL-positive, not having achieved remission: Secondary | ICD-10-CM

## 2021-03-22 ENCOUNTER — Telehealth: Payer: Self-pay | Admitting: Pharmacist

## 2021-03-22 NOTE — Telephone Encounter (Signed)
Oral Oncology Pharmacist Encounter   Prior Authorization for [place] has been denied.    Patient's insurance plan prefers generic imatinib. Will proceed with appeal process at this time for branded Gleevec.    Oral Oncology Clinic will continue to follow.    Leron Croak, PharmD, BCPS Hematology/Oncology Clinical Pharmacist [facility] [phone] 03/22/2021 3:50 PM

## 2021-03-22 NOTE — Telephone Encounter (Signed)
Oral Oncology Pharmacist Encounter   Received notification from [employer] that prior authorization for [place] is required.   PA submitted on verbally through [employer] (tele: [phone]) PA number: [id number] Status is pending   [facility] will continue to follow.   Leron Croak, PharmD, BCPS Hematology/Oncology Clinical Pharmacist [facility] [phone] 03/22/2021 11:11 AM

## 2021-03-23 NOTE — Telephone Encounter (Signed)
Oral Oncology Pharmacist Encounter   Appeal letter sent to [place] Department with supporting documentation.   Appeal faxed to: [phone].    Oral Oncology Clinic will continue to follow.    Leron Croak, PharmD, BCPS Hematology/Oncology Clinical Pharmacist [facility] [phone] 03/23/2021 3:53 PM

## 2021-03-29 NOTE — Telephone Encounter (Signed)
Oral Oncology Pharmacist Encounter   Appeal for Gleevec has been approved.     Effective dates: 02/24/21 through 03/26/22   Leron Croak, PharmD, BCPS Hematology/Oncology Clinical Pharmacist [facility] [phone] 03/29/2021 8:00 AM

## 2021-04-04 ENCOUNTER — Encounter: Payer: Self-pay | Admitting: Hematology

## 2021-04-05 ENCOUNTER — Other Ambulatory Visit: Payer: Self-pay

## 2021-04-05 ENCOUNTER — Inpatient Hospital Stay: Payer: 59 | Attending: Family

## 2021-04-05 DIAGNOSIS — C9210 Chronic myeloid leukemia, BCR/ABL-positive, not having achieved remission: Secondary | ICD-10-CM | POA: Insufficient documentation

## 2021-04-05 LAB — CBC WITH DIFFERENTIAL (CANCER CENTER ONLY)
Abs Immature Granulocytes: 0.02 10*3/uL (ref 0.00–0.07)
Basophils Absolute: 0.1 10*3/uL (ref 0.0–0.1)
Basophils Relative: 1 %
Eosinophils Absolute: 0.3 10*3/uL (ref 0.0–0.5)
Eosinophils Relative: 4 %
HCT: 38.4 % (ref 36.0–46.0)
Hemoglobin: 13.4 g/dL (ref 12.0–15.0)
Immature Granulocytes: 0 %
Lymphocytes Relative: 30 %
Lymphs Abs: 2.4 10*3/uL (ref 0.7–4.0)
MCH: 29.8 pg (ref 26.0–34.0)
MCHC: 34.9 g/dL (ref 30.0–36.0)
MCV: 85.3 fL (ref 80.0–100.0)
Monocytes Absolute: 0.6 10*3/uL (ref 0.1–1.0)
Monocytes Relative: 7 %
Neutro Abs: 4.6 10*3/uL (ref 1.7–7.7)
Neutrophils Relative %: 58 %
Platelet Count: 312 10*3/uL (ref 150–400)
RBC: 4.5 MIL/uL (ref 3.87–5.11)
RDW: 13.1 % (ref 11.5–15.5)
WBC Count: 7.9 10*3/uL (ref 4.0–10.5)
nRBC: 0 % (ref 0.0–0.2)

## 2021-04-05 LAB — CMP (CANCER CENTER ONLY)
ALT: 27 U/L (ref 0–44)
AST: 22 U/L (ref 15–41)
Albumin: 4.1 g/dL (ref 3.5–5.0)
Alkaline Phosphatase: 78 U/L (ref 38–126)
Anion gap: 5 (ref 5–15)
BUN: 9 mg/dL (ref 6–20)
CO2: 28 mmol/L (ref 22–32)
Calcium: 9.3 mg/dL (ref 8.9–10.3)
Chloride: 106 mmol/L (ref 98–111)
Creatinine: 0.94 mg/dL (ref 0.44–1.00)
GFR, Estimated: >60 mL/min (ref >60)
Glucose, Bld: 109 mg/dL — ABNORMAL HIGH (ref 70–99)
Potassium: 3.9 mmol/L (ref 3.5–5.1)
Sodium: 139 mmol/L (ref 135–145)
Total Bilirubin: 0.4 mg/dL (ref 0.3–1.2)
Total Protein: 6.9 g/dL (ref 6.5–8.1)

## 2021-04-05 LAB — VITAMIN D 25 HYDROXY (VIT D DEFICIENCY, FRACTURES): Vit D, 25-Hydroxy: 34.49 ng/mL (ref 30–100)

## 2021-04-07 LAB — COPPER, SERUM: Copper: 87 ug/dL (ref 80–158)

## 2021-04-11 LAB — BCR/ABL

## 2021-04-15 ENCOUNTER — Inpatient Hospital Stay (HOSPITAL_BASED_OUTPATIENT_CLINIC_OR_DEPARTMENT_OTHER): Payer: 59 | Admitting: Hematology

## 2021-04-15 DIAGNOSIS — E610 Copper deficiency: Secondary | ICD-10-CM | POA: Diagnosis not present

## 2021-04-15 DIAGNOSIS — R252 Cramp and spasm: Secondary | ICD-10-CM | POA: Diagnosis not present

## 2021-04-15 DIAGNOSIS — E559 Vitamin D deficiency, unspecified: Secondary | ICD-10-CM

## 2021-04-15 DIAGNOSIS — C9210 Chronic myeloid leukemia, BCR/ABL-positive, not having achieved remission: Secondary | ICD-10-CM

## 2021-04-15 MED ORDER — VITAMIN D 25 MCG (1000 UNIT) PO TABS: 5000.0000 [IU] | ORAL_TABLET | Freq: Every day | ORAL | Status: DC

## 2021-04-19 ENCOUNTER — Telehealth: Payer: Self-pay | Admitting: Hematology

## 2021-04-19 MED ORDER — BACLOFEN 5 MG PO TABS: 2.5000 mg | ORAL_TABLET | Freq: Two times a day (BID) | ORAL | 0 refills | Status: DC | PRN

## 2021-04-19 NOTE — Telephone Encounter (Signed)
Scheduled follow-up appointments per 11/3 los. Patient is aware. 

## 2021-04-21 ENCOUNTER — Encounter: Payer: Self-pay | Admitting: Family

## 2021-04-21 NOTE — Progress Notes (Addendum)
HEMATOLOGY/ONCOLOGY PHONE VISIT NOTE  Date of Service: .04/15/2021   Patient Care Team: Copland, Gay Filler, MD as PCP - General (Family Medicine)  CHIEF COMPLAINTS/PURPOSE OF CONSULTATION:  Follow-up for continued evaluation and management of CML  HISTORY OF PRESENTING ILLNESS:  Please see previous note for details of initial presentation.  Interval History:  I connected with Victoria Hall on .04/15/2021 2 by telephone and verified that I am speaking with the correct person using two identifiers.   I discussed the limitations of evaluation and management by telemedicine. The patient expressed understanding and agreed to proceed.   Other persons participating in the visit and their role in the encounter:                                                   none   Patient's location: Home Provider's location: [place] [facility] [place] was called to follow-up on her CML and for lab review. He notes that overall she is feeling a little better since her last visit with slightly decreased fatigue and muscle cramps. No issues with significant fluid retention. Continues to be on imatinib 200 mg p.o. daily. Labs 04/05/2020 showed normal CBC 25-hydroxy vitamin D 34.4 Copper 87  BCR/ABL -shows major molecular remission CMP within normal limits  We discussed in detail and patient would like to try low-dose muscle relaxants to see if that helps with her muscle cramps.  MEDICAL HISTORY:  Past Medical History:  Diagnosis Date   Anxiety    Past hx- [facility] due to Low BP    Arthritis    Asthma    early teens only    Cervical intraepithelial neoplasia grade 3 2002   s/p LEEP Rx repeat pap negative   Cervical radiculopathy    CML (chronic myelocytic leukemia) ([place]) 06/03/2018   Fallopian tube disorder    right fallopian tube removed   Gallstones 09/2015   On CT   Headache    Migraines   Hemorrhagic ovarian cyst 02/08/2016   Hydrosalpinx    [facility]   Hydrosalpinx 02/08/2016   Interstitial cystitis    Neurological abnormality    Neg workup with MRI/MRA in 2007 WNL except decreased caliber MCA proximally, distal cavernous portion of left LCA which may represent true stenosis or technique on exam. Evaluated by Dr Linus Salmons.   Palpitations    Partial seizures (HCC)    questionable diag   Pneumonia    walking pneumonia   Polycystic ovary    multiple ovarian cysts removed   S/P cervical discectomy    Dr Vertell Limber, anterior discectomy C5-C7   S/P epidural steroid injection    Right hip   Seizures ([place]) 2005   simple partial seizure disorder-was mast cell problems per pt 09-15-2019- no seizures per pt     SURGICAL HISTORY: Past Surgical History:  Procedure Laterality Date   APPENDECTOMY  1986   BREAST BIOPSY Right 09/2019   BUNIONECTOMY Left    bunion removal   CERVICAL DISC SURGERY  2005   C5-C7   COLONOSCOPY     core breast biopsy      DENTAL SURGERY     fallopian tue removed     LAPAROSCOPIC BILATERAL SALPINGECTOMY Left 02/09/2016   Procedure: LAPAROSCOPIC LEFT  SALPINGECTOMY;  Surgeon: Janyth Contes, MD;  Location: [facility];  Service: Gynecology;  Laterality: Left;   LAPAROSCOPIC LYSIS OF ADHESIONS  02/09/2016   Procedure: LAPAROSCOPIC LYSIS OF ADHESIONS;  Surgeon: Janyth Contes, MD;  Location: [facility];  Service: Gynecology;;  momentum to adenexa   LAPAROSCOPIC OVARIAN CYSTECTOMY Right 02/09/2016   Procedure: LAPAROSCOPIC OVARIAN CYSTECTOMY;  Surgeon: Janyth Contes, MD;  Location: [facility];  Service: Gynecology;  Laterality: Right;  x2 to right ovary   LEEP  1998   OOPHORECTOMY Left 02/09/2016   Procedure: LEFT OOPHORECTOMY;  Surgeon: Janyth Contes, MD;  Location: [facility];  Service: Gynecology;  Laterality: Left;- pt states left ovary has NOT been removed    POLYPECTOMY     TONSILLECTOMY  2001   UPPER GASTROINTESTINAL ENDOSCOPY     UPPER GI ENDOSCOPY      SOCIAL HISTORY: Social History    Socioeconomic History   Marital status: Married    Spouse name: Not on file   Number of children: 0   Years of education: Not on file   Highest education level: Not on file  Occupational History   Occupation: novelist    Comment: has been accepted into [employer]..   Occupation: Pharmacist, hospital   Occupation: Curator  Tobacco Use   Smoking status: Former    Packs/day: 0.00    Years: 0.00    Pack years: 0.00    Types: Cigarettes    Quit date: 11/02/2015    Years since quitting: 5.4   Smokeless tobacco: Never   Tobacco comments:    currently smoking, began at beginning of June and plans to quit on June 14th. Has smoked off and on in the past.  Vaping Use   Vaping Use: Every day  Substance and Sexual Activity   Alcohol use: Yes    Alcohol/week: 1.0 - 2.0 standard drink    Types: 1 - 2 Standard drinks or equivalent per week    Comment: occasionally 0-2 per day- rare    Drug use: No   Sexual activity: Yes  Other Topics Concern   Not on file  Social History Narrative   Not on file   Social Determinants of Health   Financial Resource Strain: Not on file  Food Insecurity: Not on file  Transportation Needs: Not on file  Physical Activity: Not on file  Stress: Not on file  Social Connections: Not on file  Intimate Partner Violence: Not on file    FAMILY HISTORY: Family History  Adopted: Yes  Problem Relation Age of Onset   COPD Mother    Breast cancer Mother    Other Mother        Teeth problems- all removed by age 77   Rashes / Skin problems Sister    Other Sister        Teeth problems- all removed by age 72   Rashes / Skin problems Brother    Asthma Brother    Other Brother        Teeth problems- all removed by age 46   Breast cancer Maternal Aunt    Adrenal disorder Neg Hx    Colon cancer Neg Hx    Colon polyps Neg Hx    Esophageal cancer Neg Hx    Stomach cancer Neg Hx    Rectal cancer Neg Hx     ALLERGIES:  is allergic to  clindamycin/lincomycin, codeine, cyclobenzaprine, gabapentin, nyquil multi-symptom [pseudoeph-doxylamine-dm-apap], uribel [meth-hyo-m bl-na phos-ph sal], xanax [alprazolam], and urelle.  MEDICATIONS:  Current Outpatient Medications  Medication  Sig Dispense Refill   Baclofen 5 MG TABS Take 2.5 mg by mouth 2 (two) times daily as needed. 30 tablet 0   Ascorbic Acid (VITAMIN C) 100 MG tablet Take 100 mg by mouth daily.     cholecalciferol (VITAMIN D3) 25 MCG (1000 UNIT) tablet Take 5 tablets (5,000 Units total) by mouth daily.     clonazePAM (KLONOPIN) 0.5 MG tablet Take 1 tablet (0.5 mg total) by mouth 2 (two) times daily as needed for anxiety. 20 tablet 1   Copper Gluconate (COPPER CAPS) 2 MG CAPS Take 2 mg by mouth as directed. 80 capsule 0   famotidine (PEPCID) 20 MG tablet Take 10 mg by mouth daily.     GLEEVEC 100 MG tablet TAKE 2 TABLETS BY MOUTH ONCE DAILY AT THE SAME TIME WITH FOOD AND A LARGE GLASS OF WATER. AVOID GRAPEFRUIT PRODUCTS. 60 tablet 1   loratadine (CLARITIN) 10 MG tablet Take 10 mg by mouth daily.     magnesium oxide (MAG-OX) 400 (240 Mg) MG tablet TAKE 1 TABLET BY MOUTH EVERY DAY 90 tablet 1   Oyster Shell Calcium 500 MG TABS Please specify directions, refills and quantity 30 tablet 2   No current facility-administered medications for this visit.    REVIEW OF SYSTEMS:   10 Point review of Systems was done is negative except as noted above.   PHYSICAL EXAMINATION: Telehealth Visit.   LABORATORY DATA:  I have reviewed the data as listed   CBC Latest Ref Rng & Units 04/05/2021 12/24/2020 08/26/2020  WBC 4.0 - 10.5 K/uL 7.9 7.7 6.5  Hemoglobin 12.0 - 15.0 g/dL 13.4 13.2 13.6  Hematocrit 36.0 - 46.0 % 38.4 37.5 39.1  Platelets 150 - 400 K/uL 312 279 288    CBC    Component Value Date/Time   WBC 7.9 04/05/2021 1444   WBC 7.7 12/24/2020 1441   RBC 4.50 04/05/2021 1444   HGB 13.4 04/05/2021 1444   HGB 15.1 03/26/2017 1101   HCT 38.4 04/05/2021 1444   HCT 44.0  03/26/2017 1101   PLT 312 04/05/2021 1444   PLT 330 03/26/2017 1101   MCV 85.3 04/05/2021 1444   MCV 84 03/26/2017 1101   MCH 29.8 04/05/2021 1444   MCHC 34.9 04/05/2021 1444   RDW 13.1 04/05/2021 1444   RDW 14.0 03/26/2017 1101   LYMPHSABS 2.4 04/05/2021 1444   LYMPHSABS 2.8 03/26/2017 1101   MONOABS 0.6 04/05/2021 1444   EOSABS 0.3 04/05/2021 1444   EOSABS 0.4 03/26/2017 1101   BASOSABS 0.1 04/05/2021 1444   BASOSABS 0.2 03/26/2017 1101     . CMP Latest Ref Rng & Units 04/05/2021 12/24/2020 08/26/2020  Glucose 70 - 99 mg/dL 109(H) 109(H) 116(H)  BUN 6 - 20 mg/dL 9 10 10   Creatinine 0.44 - 1.00 mg/dL 0.94 1.05(H) 1.00  Sodium 135 - 145 mmol/L 139 141 142  Potassium 3.5 - 5.1 mmol/L 3.9 4.0 3.8  Chloride 98 - 111 mmol/L 106 105 104  CO2 22 - 32 mmol/L 28 27 26   Calcium 8.9 - 10.3 mg/dL 9.3 9.5 9.6  Total Protein 6.5 - 8.1 g/dL 6.9 6.9 7.1  Total Bilirubin 0.3 - 1.2 mg/dL 0.4 0.5 0.4  Alkaline Phos 38 - 126 U/L 78 75 75  AST 15 - 41 U/L 22 27 27   ALT 0 - 44 U/L 27 32 39   Component     Latest Ref Rng & Units 04/05/2021  Vitamin D, 25-Hydroxy     30 - 100  ng/mL 34.49  Copper     80 - 158 ug/dL 87     05/16/18 Cytogenetics:   04/10/18 Hemochromatosis Panel:    RADIOGRAPHIC STUDIES: I have personally reviewed the radiological images as listed and agreed with the findings in the report. No results found.  01/31/18 ECHO Transthoracic:   06/03/2020 BCR-ABL PCR   08/26/2020 BCR-ABL    ASSESSMENT & PLAN:   51 y.o. female with  1. CML- Chronic phase 05/16/18 BM Cytogenetics revealed translocation 9;22. FISH report revealed BCR-ABL gene rearrangement present in 86.5% of cells 06/03/18 FISH revealed BCR-ABL gene rearrangement present in 85.67% of cells, with some hypercellularity around 80% 06/05/18 US Abdomen revealed normal spleen size 01/31/18 ECHO revealed LV EF of 63%, no regional wall motion abnormalities, no significant valvular hear disease  Initial  pre-treatment 07/25/18 BCR-ABL revealed IS at 71.1362%  08/28/18 EKG reviewed  2. 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 Mild copper deficiency- replaced.  PLAN: -Discussed lab results from 04/05/2021 as noted above Labs 04/05/2020 showed normal CBC 25-hydroxy vitamin D 34.4 Copper 87  BCR/ABL -shows major molecular remission CMP within normal limits -Advised pt that we will keep Imatinib dose @ 200 mg po daily due to known occurred toxicities from dosage at 300 mg. -The pt has no prohibitive toxicities from continuing 200 mg Imatinib at this time. -continue 2 mg Copper on M,W,F.  -Continue 5000 IU Vitamin D daily. -Continue daily antihistamines.  -Given low-dose baclofen 2.5 mg p.o. twice daily as needed for significant muscle cramps  FOLLOW UP: RTC w Dr Victoria Hall in 3 months. Please schedule labs 2 weeks prior.    All of the patient's questions were answered with apparent satisfaction. The patient knows to call the clinic with any problems, questions or concerns.   Sullivan Lone MD MS AAHIVMS [facility] Hematology/Oncology Physician [facility]        .Marland Kitchen

## 2021-04-25 NOTE — Addendum Note (Signed)
Addended by: Sullivan Lone on: 04/25/2021 06:45 AM   Modules accepted: Orders

## 2021-04-27 ENCOUNTER — Other Ambulatory Visit: Payer: Self-pay | Admitting: Hematology

## 2021-05-09 ENCOUNTER — Other Ambulatory Visit: Payer: Self-pay | Admitting: Hematology

## 2021-05-09 DIAGNOSIS — C9210 Chronic myeloid leukemia, BCR/ABL-positive, not having achieved remission: Secondary | ICD-10-CM

## 2021-05-11 ENCOUNTER — Encounter: Payer: Self-pay | Admitting: Family Medicine

## 2021-05-31 ENCOUNTER — Encounter: Payer: Self-pay | Admitting: Gastroenterology

## 2021-05-31 ENCOUNTER — Ambulatory Visit: Payer: 59 | Admitting: Gastroenterology

## 2021-05-31 VITALS — BP 140/78 | HR 97 | Wt 197.1 lb

## 2021-05-31 DIAGNOSIS — K921 Melena: Secondary | ICD-10-CM

## 2021-05-31 DIAGNOSIS — Z86010 Personal history of colonic polyps: Secondary | ICD-10-CM | POA: Diagnosis not present

## 2021-05-31 NOTE — Patient Instructions (Signed)
Call our office back if your recurrent bleeding.   The [facility] providers would like to encourage you to use [facility] to communicate with providers for non-urgent requests or questions.  Due to long hold times on the telephone, sending your provider a message by [facility] may be a faster and more efficient way to get a response.  Please allow 48 business hours for a response.  Please remember that this is for non-urgent requests.   Thank you for choosing me and [facility].  Pricilla Riffle. Dagoberto Ligas., MD., [facility] Regal

## 2021-05-31 NOTE — Progress Notes (Signed)
° ° °  History of Present Illness: This is a 51 year old female complaining of painless rectal bleeding.  She relates 2 episodes of bright red blood per rectum with bowel movements.  She noted a small amount of blood in the commode, on the stool and on the tissue paper.  There was no associated diarrhea, constipation or straining.  Her symptoms have not recurred for several weeks.  She is maintained on Gleevac and states that since her dose was reduced she has not had significant problems with diarrhea.  She relates ongoing problems with muscle spasms in her right back and right flank.  Colonoscopy 09/2019 - One 6 mm polyp in the sigmoid colon, removed with a cold snare. Resected and retrieved. - Post-polypectomy scar in the cecum. - The examination was otherwise normal on direct and retroflexion views.  Current Medications, Allergies, Past Medical History, Past Surgical History, Family History and Social History were reviewed in [employer] record.   Physical Exam: General: Well developed, well nourished, no acute distress Head: Normocephalic and atraumatic Eyes: Sclerae anicteric, EOMI Ears: Normal auditory acuity Mouth: Not examined, mask on during Covid-19 pandemic Lungs: Clear throughout to auscultation Heart: Regular rate and rhythm; no murmurs, rubs or bruits Abdomen: Soft, mild tenderness at right costal margin and along right flank and non distended. No masses, hepatosplenomegaly or hernias noted. Normal Bowel sounds Rectal: No lesions, no tenderness, no stool for Hemoccult  Musculoskeletal: Symmetrical with no gross deformities  Pulses:  Normal pulses noted Extremities: No clubbing, cyanosis, edema or deformities noted Neurological: Alert oriented x 4, grossly nonfocal Psychological:  Alert and cooperative. Normal mood and affect   Assessment and Recommendations:  Self-limited painless hematochezia.  Suspect hemorrhoidal bleeding.  Advised Preparation H  suppositories daily and consider colonoscopy if symptoms recur. Personal history of sessile serrated and adenomatous colon polyps.  Surveillance colonoscopy is recommended in June 2026. CML on Gleevec.

## 2021-06-14 NOTE — Progress Notes (Addendum)
Therapist, music at [name] ?[address], [address] ?[place], [place] [address] ?336 [phone] ?Fax [phone] ? ?Date:  06/15/2021  ? ?Name:  Victoria Hall   DOB:  [birth year]   MRN:  [id number] ? ?PCP:  Darreld Mclean, MD  ? ? ?Chief Complaint: Annual Exam and Vaginal Pain (During intercourse) ? ? ?History of Present Illness: ? ?Victoria Hall is a 51 y.o. very pleasant female patient who presents with the following: ? ?Patient is seen today for physical exam and Pap ?Most recent visit with myself was about 1 year ago ?Patient with history of CML, POTS, mast cell activation syndrome, hypermobility, hemochromatosis, chronic fatigue ?Her oncologist is Dr. Irene Limbo ? ?She saw her gastroenterologist, Dr. Fuller Plan last month for rectal bleeding ?Most recent colonoscopy June 2021 ?Dr. Fuller Plan suspected hemorrhoids, will not do colonoscopy at this point unless bleeding continues ? ?Most recent visit with oncology, Dr. Irene Limbo was in January ?PLAN: ?-Discussed lab results from 04/05/2021 as noted above ?Labs 04/05/2020 showed normal CBC ?25-hydroxy vitamin D 34.4 ?Copper 87  ?BCR/ABL -shows major molecular remission ?CMP within normal limits ?-Advised pt that we will keep Imatinib dose @ 200 mg po daily due to known occurred toxicities from dosage at 300 mg. ?-The pt has no prohibitive toxicities from continuing 200 mg Imatinib at this time. ?-continue 2 mg Copper on M,W,F.  ?-Continue 5000 IU Vitamin D daily. ?-Continue daily antihistamines.  ?-Given low-dose baclofen 2.5 mg p.o. twice daily as needed for significant muscle cramps ? ?Her asthma and allergy/mast cell care is with [facility].  Most recent visit was in September to follow-up on mast cell symptoms.  She had stopped imatinab a few months prior just for surgery, then restarted she notes that her mast cell symptoms continue to wax and wane ?She cannot determine triggers except for over- exertion  ?She unfortunately cannot do much physical  activity for this reason ? ?Flu vaccine- does not do  ?COVID booster up-to-date ?Pap smear 2020, update today ?She has noted pain with intercourse for about 2 months ?She has tried lots of different lubricants- not helpful  ?Her mother did have breast cancer-she is not aware of any other GYN cancers in the family ? ?She had an oophorectomy and salpingectomy but still has her uterus ?LMP was about 2009 ? ?She is not fasting today  ?Patient Active Problem List  ? Diagnosis Date Noted  ? [place] 08/08/2019  ? CML (chronic myelocytic leukemia) ([place]) 06/03/2018  ? Hemochromatosis 04/18/2018  ? Hypermobility syndrome 09/26/2017  ? Mast cell activation syndrome ([place]) 09/26/2017  ? POTS (postural orthostatic tachycardia syndrome) 08/10/2017  ? Incomplete right bundle branch block 07/11/2017  ? Fatigue 07/11/2017  ? Low back pain 07/21/2016  ? Right knee pain 06/05/2016  ? Left shoulder pain 06/05/2016  ? Hydrosalpinx 02/08/2016  ? Hemorrhagic ovarian cyst 02/08/2016  ? Right hip pain 02/03/2016  ? HYDROSALPINX 02/07/2008  ? HEADACHE 10/28/2007  ? TACHYCARDIA 10/14/2007  ? ANXIETY DISORDER 05/28/2007  ? POLYARTHRITIS 05/09/2007  ? UNSPECIFIED DISORDERS OF NERVOUS SYSTEM 04/22/2007  ? ABDOMINAL PAIN RIGHT LOWER QUADRANT 04/08/2007  ? PALPITATIONS, RECURRENT 11/07/2006  ? PROBLEMS W/SMELL/TASTE 06/29/2006  ? TOBACCO ABUSE 01/08/2006  ? VISUAL IMPAIRMENT 01/08/2006  ? DISTURBANCE, VISUAL NOS 01/08/2006  ? FIBROCYSTIC BREAST DISEASE 01/08/2006  ? [place] DISEASE, CERVICAL 01/08/2006  ? VERTIGO 01/08/2006  ? ? ?Past Medical History:  ?Diagnosis Date  ? Anxiety   ? [facility]- [facility] due  to Low BP   ? Arthritis   ? Asthma   ? early teens only   ? Cervical intraepithelial neoplasia grade 3 2002  ? s/p LEEP Rx repeat pap negative  ? Cervical radiculopathy   ? CML (chronic myelocytic leukemia) ([place]) 06/03/2018  ? Fallopian tube disorder   ? right fallopian tube removed  ? Gallstones 09/2015  ? On CT  ? Headache   ? Migraines  ?  Hemorrhagic ovarian cyst 02/08/2016  ? Hydrosalpinx   ? [facility]  ? Hydrosalpinx 02/08/2016  ? Interstitial cystitis   ? Neurological abnormality   ? Neg workup with MRI/MRA in 2007 WNL except decreased caliber MCA proximally, distal cavernous portion of left LCA which may represent true stenosis or technique on exam. Evaluated by Dr Linus Salmons.  ? Palpitations   ? Partial seizures ([place])   ? questionable diag  ? Pneumonia   ? walking pneumonia  ? Polycystic ovary   ? multiple ovarian cysts removed  ? S/P cervical discectomy   ? Dr Vertell Limber, anterior discectomy C5-C7  ? S/P epidural steroid injection   ? Right hip  ? Seizures ([place]) 2005  ? simple partial seizure disorder-was mast cell problems per pt 09-15-2019- no seizures per pt   ? ? ?Past Surgical History:  ?Procedure Laterality Date  ? APPENDECTOMY  1986  ? BREAST BIOPSY Right 09/2019  ? BUNIONECTOMY Left   ? bunion removal  ? [place] SURGERY  2005  ? C5-C7  ? COLONOSCOPY    ? core breast biopsy     ? DENTAL SURGERY    ? fallopian tue removed    ? LAPAROSCOPIC BILATERAL SALPINGECTOMY Left 02/09/2016  ? Procedure: LAPAROSCOPIC LEFT  SALPINGECTOMY;  Surgeon: Janyth Contes, MD;  Location: [facility];  Service: Gynecology;  Laterality: Left;  ? LAPAROSCOPIC LYSIS OF ADHESIONS  02/09/2016  ? Procedure: LAPAROSCOPIC LYSIS OF ADHESIONS;  Surgeon: Janyth Contes, MD;  Location: [facility];  Service: Gynecology;;  momentum to adenexa  ? LAPAROSCOPIC OVARIAN CYSTECTOMY Right 02/09/2016  ? Procedure: LAPAROSCOPIC OVARIAN CYSTECTOMY;  Surgeon: Janyth Contes, MD;  Location: [facility];  Service: Gynecology;  Laterality: Right;  x2 to right ovary  ? LEEP  1998  ? OOPHORECTOMY Left 02/09/2016  ? Procedure: LEFT OOPHORECTOMY;  Surgeon: Janyth Contes, MD;  Location: [facility];  Service: Gynecology;  Laterality: Left;- pt states left ovary has NOT been removed   ? POLYPECTOMY    ? TONSILLECTOMY  2001  ? UPPER GASTROINTESTINAL ENDOSCOPY    ? UPPER GI  ENDOSCOPY    ? ? ?Social History  ? ?Tobacco Use  ? Smoking status: Former  ?  Packs/day: 0.00  ?  Years: 0.00  ?  Pack years: 0.00  ?  Types: Cigarettes  ?  Quit date: 11/02/2015  ?  Years since quitting: 5.6  ? Smokeless tobacco: Never  ? Tobacco comments:  ?  currently smoking, began at beginning of June and plans to quit on June 14th. Has smoked off and on in the past.  ?Vaping Use  ? Vaping Use: Every day  ?Substance Use Topics  ? Alcohol use: Yes  ?  Alcohol/week: 1.0 - 2.0 standard drink  ?  Types: 1 - 2 Standard drinks or equivalent per week  ?  Comment: occasionally 0-2 per day- rare   ? Drug use: No  ? ? ?Family History  ?Adopted: Yes  ?Problem Relation Age of Onset  ? COPD Mother   ? Breast cancer  Mother   ? Other Mother   ?     Teeth problems- all removed by age 18  ? Rashes / Skin problems Sister   ? Other Sister   ?     Teeth problems- all removed by age 2  ? Rashes / Skin problems Brother   ? Asthma Brother   ? Other Brother   ?     Teeth problems- all removed by age 46  ? Breast cancer Maternal Aunt   ? Adrenal disorder Neg Hx   ? Colon cancer Neg Hx   ? Colon polyps Neg Hx   ? Esophageal cancer Neg Hx   ? Stomach cancer Neg Hx   ? Rectal cancer Neg Hx   ? ? ?Allergies  ?Allergen Reactions  ? Clindamycin/Lincomycin Itching and Other (See Comments)  ?  Dizziness, trouble swallowing  ? Codeine Itching and Nausea And Vomiting  ? Cyclobenzaprine Other (See Comments)  ?  Fast heartbeat, restless leg, nightmares  ? Gabapentin Other (See Comments)  ?  Headaches, visual disturbances.   ? Nyquil Multi-Symptom [Pseudoeph-Doxylamine-Dm-Apap] Other (See Comments)  ?  Heart racing, dizziness, weakness  ? Uribel [Meth-Hyo-M Bl-Na Phos-Ph Sal] Other (See Comments)  ?  Heart racing, dizziness, blurry vision.  ? Xanax [Alprazolam]   ?  Bruising  ? Urelle Nausea And Vomiting  ? ? ?Medication list has been reviewed and updated. ? ?Current Outpatient Medications on File Prior to Visit  ?Medication Sig Dispense Refill  ?  Ascorbic Acid (VITAMIN C) 100 MG tablet Take 100 mg by mouth daily.    ? cholecalciferol (VITAMIN D3) 25 MCG (1000 UNIT) tablet Take 5 tablets (5,000 Units total) by mouth daily.    ? clonazePAM (KLONOPIN) 0.5

## 2021-06-15 ENCOUNTER — Encounter: Payer: Self-pay | Admitting: Family Medicine

## 2021-06-15 ENCOUNTER — Other Ambulatory Visit (HOSPITAL_COMMUNITY)
Admission: RE | Admit: 2021-06-15 | Discharge: 2021-06-15 | Disposition: A | Discharge status: Home / Self Care | Payer: 59 | Source: Ambulatory Visit | Attending: Family Medicine | Admitting: Family Medicine

## 2021-06-15 ENCOUNTER — Ambulatory Visit (INDEPENDENT_AMBULATORY_CARE_PROVIDER_SITE_OTHER): Payer: 59 | Admitting: Family Medicine

## 2021-06-15 VITALS — BP 118/80 | HR 95 | Resp 20 | Ht 66.0 in | Wt 195.4 lb

## 2021-06-15 DIAGNOSIS — Z1329 Encounter for screening for other suspected endocrine disorder: Secondary | ICD-10-CM | POA: Diagnosis not present

## 2021-06-15 DIAGNOSIS — Z124 Encounter for screening for malignant neoplasm of cervix: Secondary | ICD-10-CM

## 2021-06-15 DIAGNOSIS — Z13220 Encounter for screening for lipoid disorders: Secondary | ICD-10-CM | POA: Diagnosis not present

## 2021-06-15 DIAGNOSIS — Z131 Encounter for screening for diabetes mellitus: Secondary | ICD-10-CM

## 2021-06-15 DIAGNOSIS — Z0000 Encounter for general adult medical examination without abnormal findings: Secondary | ICD-10-CM | POA: Diagnosis not present

## 2021-06-15 DIAGNOSIS — F419 Anxiety disorder, unspecified: Secondary | ICD-10-CM

## 2021-06-15 LAB — LIPID PANEL
Cholesterol: 172 mg/dL (ref 0–200)
HDL: 40.9 mg/dL (ref >39.00)
LDL Cholesterol: 93 mg/dL (ref 0–99)
NonHDL: 131.2
Total CHOL/HDL Ratio: 4
Triglycerides: 192 mg/dL — ABNORMAL HIGH (ref 0.0–149.0)
VLDL: 38.4 mg/dL (ref 0.0–40.0)

## 2021-06-15 LAB — HEMOGLOBIN A1C: Hgb A1c MFr Bld: 5.4 % (ref 4.6–6.5)

## 2021-06-15 LAB — TSH: TSH: 2.03 u[IU]/mL (ref 0.35–5.50)

## 2021-06-15 MED ORDER — CLONAZEPAM 0.5 MG PO TABS: 0.5000 mg | ORAL_TABLET | Freq: Two times a day (BID) | ORAL | 1 refills | Status: DC | PRN

## 2021-06-15 NOTE — Patient Instructions (Signed)
Good to see you again today!  I will be in touch with your pap and labs asap ? ?I will ask Dr Irene Limbo if he thinks a topical vaginal estrogen may be safe for you ? ? ? ? ?

## 2021-06-16 ENCOUNTER — Encounter: Payer: Self-pay | Admitting: Family Medicine

## 2021-06-16 LAB — CYTOLOGY - PAP
Adequacy: ABSENT
Comment: NEGATIVE
Diagnosis: NEGATIVE
High risk HPV: NEGATIVE

## 2021-06-20 ENCOUNTER — Encounter: Payer: Self-pay | Admitting: Family Medicine

## 2021-06-20 DIAGNOSIS — N949 Unspecified condition associated with female genital organs and menstrual cycle: Secondary | ICD-10-CM

## 2021-06-20 MED ORDER — ESTRADIOL 0.1 MG/GM VA CREA: 0.5000 g | TOPICAL_CREAM | Freq: Every day | VAGINAL | 12 refills | Status: DC

## 2021-07-07 ENCOUNTER — Inpatient Hospital Stay: Payer: 59 | Attending: Family

## 2021-07-07 ENCOUNTER — Other Ambulatory Visit: Payer: Self-pay

## 2021-07-07 DIAGNOSIS — C9210 Chronic myeloid leukemia, BCR/ABL-positive, not having achieved remission: Secondary | ICD-10-CM | POA: Diagnosis present

## 2021-07-07 LAB — CMP (CANCER CENTER ONLY)
ALT: 27 U/L (ref 0–44)
AST: 22 U/L (ref 15–41)
Albumin: 4.2 g/dL (ref 3.5–5.0)
Alkaline Phosphatase: 79 U/L (ref 38–126)
Anion gap: 5 (ref 5–15)
BUN: 11 mg/dL (ref 6–20)
CO2: 30 mmol/L (ref 22–32)
Calcium: 9.4 mg/dL (ref 8.9–10.3)
Chloride: 104 mmol/L (ref 98–111)
Creatinine: 0.98 mg/dL (ref 0.44–1.00)
GFR, Estimated: >60 mL/min (ref >60)
Glucose, Bld: 97 mg/dL (ref 70–99)
Potassium: 4.1 mmol/L (ref 3.5–5.1)
Sodium: 139 mmol/L (ref 135–145)
Total Bilirubin: 0.5 mg/dL (ref 0.3–1.2)
Total Protein: 7 g/dL (ref 6.5–8.1)

## 2021-07-07 LAB — CBC WITH DIFFERENTIAL/PLATELET
Abs Immature Granulocytes: 0.01 10*3/uL (ref 0.00–0.07)
Basophils Absolute: 0.1 10*3/uL (ref 0.0–0.1)
Basophils Relative: 1 %
Eosinophils Absolute: 0.3 10*3/uL (ref 0.0–0.5)
Eosinophils Relative: 4 %
HCT: 38.8 % (ref 36.0–46.0)
Hemoglobin: 13.7 g/dL (ref 12.0–15.0)
Immature Granulocytes: 0 %
Lymphocytes Relative: 33 %
Lymphs Abs: 2.5 10*3/uL (ref 0.7–4.0)
MCH: 30.3 pg (ref 26.0–34.0)
MCHC: 35.3 g/dL (ref 30.0–36.0)
MCV: 85.8 fL (ref 80.0–100.0)
Monocytes Absolute: 0.6 10*3/uL (ref 0.1–1.0)
Monocytes Relative: 7 %
Neutro Abs: 4.3 10*3/uL (ref 1.7–7.7)
Neutrophils Relative %: 55 %
Platelets: 310 10*3/uL (ref 150–400)
RBC: 4.52 MIL/uL (ref 3.87–5.11)
RDW: 13.2 % (ref 11.5–15.5)
WBC: 7.8 10*3/uL (ref 4.0–10.5)
nRBC: 0 % (ref 0.0–0.2)

## 2021-07-14 LAB — BCR/ABL

## 2021-07-20 ENCOUNTER — Encounter: Payer: Self-pay | Admitting: Family Medicine

## 2021-07-20 ENCOUNTER — Other Ambulatory Visit (HOSPITAL_COMMUNITY)
Admission: RE | Admit: 2021-07-20 | Discharge: 2021-07-20 | Disposition: A | Discharge status: Home / Self Care | Payer: 59 | Source: Ambulatory Visit | Attending: Family Medicine | Admitting: Family Medicine

## 2021-07-20 ENCOUNTER — Ambulatory Visit (INDEPENDENT_AMBULATORY_CARE_PROVIDER_SITE_OTHER): Payer: 59 | Admitting: Family Medicine

## 2021-07-20 VITALS — BP 122/76 | HR 90 | Temp 98.9°F | Resp 18 | Ht 66.0 in | Wt 194.2 lb

## 2021-07-20 DIAGNOSIS — R5381 Other malaise: Secondary | ICD-10-CM | POA: Diagnosis not present

## 2021-07-20 DIAGNOSIS — R202 Paresthesia of skin: Secondary | ICD-10-CM

## 2021-07-20 DIAGNOSIS — N898 Other specified noninflammatory disorders of vagina: Secondary | ICD-10-CM

## 2021-07-20 LAB — CBC WITH DIFFERENTIAL/PLATELET
Absolute Monocytes: 622 cells/uL (ref 200–950)
Basophils Absolute: 84 cells/uL (ref 0–200)
Basophils Relative: 1 %
Eosinophils Absolute: 328 cells/uL (ref 15–500)
Eosinophils Relative: 3.9 %
HCT: 39.6 % (ref 35.0–45.0)
Hemoglobin: 13.5 g/dL (ref 11.7–15.5)
Lymphs Abs: 2780 cells/uL (ref 850–3900)
MCH: 30.2 pg (ref 27.0–33.0)
MCHC: 34.1 g/dL (ref 32.0–36.0)
MCV: 88.6 fL (ref 80.0–100.0)
MPV: 10.2 fL (ref 7.5–12.5)
Monocytes Relative: 7.4 %
Neutro Abs: 4586 cells/uL (ref 1500–7800)
Neutrophils Relative %: 54.6 %
Platelets: 323 10*3/uL (ref 140–400)
RBC: 4.47 10*6/uL (ref 3.80–5.10)
RDW: 13.1 % (ref 11.0–15.0)
Total Lymphocyte: 33.1 %
WBC: 8.4 10*3/uL (ref 3.8–10.8)

## 2021-07-20 LAB — POCT URINALYSIS DIP (MANUAL ENTRY)
Bilirubin, UA: NEGATIVE
Blood, UA: NEGATIVE
Glucose, UA: NEGATIVE mg/dL
Ketones, POC UA: NEGATIVE mg/dL
Leukocytes, UA: NEGATIVE
Nitrite, UA: NEGATIVE
Protein Ur, POC: NEGATIVE mg/dL
Spec Grav, UA: <=1.005 — AB (ref 1.010–1.025)
Urobilinogen, UA: 0.2 E.U./dL
pH, UA: 6 (ref 5.0–8.0)

## 2021-07-20 NOTE — Progress Notes (Addendum)
Therapist, music at [name] ?[address], [address] ?[place], [place] [address] ?336 [phone] ?Fax [phone] ? ?Date:  07/20/2021  ? ?Name:  Victoria Hall   DOB:  03-02-1971   MRN:  [id number] ? ?PCP:  Darreld Mclean, MD  ? ? ?Chief Complaint: Vaginal Discharge (Vaginal pain, itching, a dark yellow discharge, body aches, nausea, feeling like I have a fever) ? ? ?History of Present Illness: ? ?Victoria Hall is a 51 y.o. very pleasant female patient who presents with the following: ? ?Victoria Hall is seen today for concern of possible acute illness ?Patient with history of CML, POTS, mast cell activation syndrome, hypermobility, hemochromatosis, chronic fatigue ?Her oncologist is Dr. Irene Limbo ? ?I saw her about a month ago, we started on a low-dose vaginal estrogen for vaginal dryness and discomfort-Estrace 0.1 mg cream 2-3 times weekly ?However she did not start it yet  ? ?She contacted me earlier today with the following: ?In addition to vaginal pain, I am now having itching, a dark yellow discharge, and am feeling like I have the flu (body aches, nausea, feeling like I have a fever). My temperatures-which usually reads low, is 99.2.  Could this be an infection, rather than low estrogen? ? ?She has noted vaginal itching around the vulva for a few weeks off and on- really itchy!  She was abe to settle the itching down by allowing it to air out.  However she then noted onset of unusual discharge for nearly a month- did not seem like a big deal but has continued so she was a bit concerned ?She is actually not yet started her vaginal estrogen, she notes that she does use tobacco with a vape pen and was concerned about possible increase of blood clot risk ? ?Last night she was not feeling so well (no particular symptoms, just felt off)- she checked her temp and it was 99.5 max over yesterday and today ?Now cough or ST, she felt achy however ?More tired than usual  ?No urinary sx that she  has noted  ?She has noted muscle cramping on her right body today-however this is not unusual for her, she notes this will happen when she is tired or not feeling her best ? ?Menopausal for a couple of years - no bleeding since  ?Pap done last month- normal  ?She has had a salpingectomy but otherwise notes that her reproductive organs are present ? ?Patient Active Problem List  ? Diagnosis Date Noted  ? [place] 08/08/2019  ? CML (chronic myelocytic leukemia) ([place]) 06/03/2018  ? Hemochromatosis 04/18/2018  ? Hypermobility syndrome 09/26/2017  ? Mast cell activation syndrome ([place]) 09/26/2017  ? POTS (postural orthostatic tachycardia syndrome) 08/10/2017  ? Incomplete right bundle branch block 07/11/2017  ? Fatigue 07/11/2017  ? Low back pain 07/21/2016  ? Right knee pain 06/05/2016  ? Left shoulder pain 06/05/2016  ? Hydrosalpinx 02/08/2016  ? Hemorrhagic ovarian cyst 02/08/2016  ? Right hip pain 02/03/2016  ? HYDROSALPINX 02/07/2008  ? HEADACHE 10/28/2007  ? TACHYCARDIA 10/14/2007  ? ANXIETY DISORDER 05/28/2007  ? POLYARTHRITIS 05/09/2007  ? UNSPECIFIED DISORDERS OF NERVOUS SYSTEM 04/22/2007  ? ABDOMINAL PAIN RIGHT LOWER QUADRANT 04/08/2007  ? PALPITATIONS, RECURRENT 11/07/2006  ? PROBLEMS W/SMELL/TASTE 06/29/2006  ? TOBACCO ABUSE 01/08/2006  ? VISUAL IMPAIRMENT 01/08/2006  ? DISTURBANCE, VISUAL NOS 01/08/2006  ? FIBROCYSTIC BREAST DISEASE 01/08/2006  ? [place] DISEASE, CERVICAL 01/08/2006  ? VERTIGO 01/08/2006  ? ? ?Past Medical History:  ?  Diagnosis Date  ? Anxiety   ? [facility]- [facility] due to Low BP   ? Arthritis   ? Asthma   ? early teens only   ? Cervical intraepithelial neoplasia grade 3 2002  ? s/p LEEP Rx repeat pap negative  ? Cervical radiculopathy   ? CML (chronic myelocytic leukemia) ([place]) 06/03/2018  ? Fallopian tube disorder   ? right fallopian tube removed  ? Gallstones 09/2015  ? On CT  ? Headache   ? Migraines  ? Hemorrhagic ovarian cyst 02/08/2016  ? Hydrosalpinx   ? [facility]   ? Hydrosalpinx 02/08/2016  ? Interstitial cystitis   ? Neurological abnormality   ? Neg workup with MRI/MRA in 2007 WNL except decreased caliber MCA proximally, distal cavernous portion of left LCA which may represent true stenosis or technique on exam. Evaluated by Dr Linus Salmons.  ? Palpitations   ? Partial seizures ([place])   ? questionable diag  ? Pneumonia   ? walking pneumonia  ? Polycystic ovary   ? multiple ovarian cysts removed  ? S/P cervical discectomy   ? Dr Vertell Limber, anterior discectomy C5-C7  ? S/P epidural steroid injection   ? Right hip  ? Seizures ([place]) 2005  ? simple partial seizure disorder-was mast cell problems per pt 09-15-2019- no seizures per pt   ? ? ?Past Surgical History:  ?Procedure Laterality Date  ? APPENDECTOMY  1986  ? BREAST BIOPSY Right 09/2019  ? BUNIONECTOMY Left   ? bunion removal  ? [place] SURGERY  2005  ? C5-C7  ? COLONOSCOPY    ? core breast biopsy     ? DENTAL SURGERY    ? fallopian tue removed    ? LAPAROSCOPIC BILATERAL SALPINGECTOMY Left 02/09/2016  ? Procedure: LAPAROSCOPIC LEFT  SALPINGECTOMY;  Surgeon: Janyth Contes, MD;  Location: [facility];  Service: Gynecology;  Laterality: Left;  ? LAPAROSCOPIC LYSIS OF ADHESIONS  02/09/2016  ? Procedure: LAPAROSCOPIC LYSIS OF ADHESIONS;  Surgeon: Janyth Contes, MD;  Location: [facility];  Service: Gynecology;;  momentum to adenexa  ? LAPAROSCOPIC OVARIAN CYSTECTOMY Right 02/09/2016  ? Procedure: LAPAROSCOPIC OVARIAN CYSTECTOMY;  Surgeon: Janyth Contes, MD;  Location: [facility];  Service: Gynecology;  Laterality: Right;  x2 to right ovary  ? LEEP  1998  ? OOPHORECTOMY Left 02/09/2016  ? Procedure: LEFT OOPHORECTOMY;  Surgeon: Janyth Contes, MD;  Location: [facility];  Service: Gynecology;  Laterality: Left;- pt states left ovary has NOT been removed   ? POLYPECTOMY    ? TONSILLECTOMY  2001  ? UPPER GASTROINTESTINAL ENDOSCOPY    ? UPPER GI ENDOSCOPY    ? ? ?Social History  ? ?Tobacco Use  ? Smoking status: Former  ?  Packs/day:  0.00  ?  Years: 0.00  ?  Pack years: 0.00  ?  Types: Cigarettes  ?  Quit date: 11/02/2015  ?  Years since quitting: 5.7  ? Smokeless tobacco: Never  ? Tobacco comments:  ?  currently smoking, began at beginning of June and plans to quit on June 14th. Has smoked off and on in the past.  ?Vaping Use  ? Vaping Use: Every day  ?Substance Use Topics  ? Alcohol use: Yes  ?  Alcohol/week: 1.0 - 2.0 standard drink  ?  Types: 1 - 2 Standard drinks or equivalent per week  ?  Comment: occasionally 0-2 per day- rare   ? Drug use: No  ? ? ?Family History  ?Adopted: Yes  ?  Problem Relation Age of Onset  ? COPD Mother   ? Breast cancer Mother   ? Other Mother   ?     Teeth problems- all removed by age 38  ? Rashes / Skin problems Sister   ? Other Sister   ?     Teeth problems- all removed by age 75  ? Rashes / Skin problems Brother   ? Asthma Brother   ? Other Brother   ?     Teeth problems- all removed by age 54  ? Breast cancer Maternal Aunt   ? Adrenal disorder Neg Hx   ? Colon cancer Neg Hx   ? Colon polyps Neg Hx   ? Esophageal cancer Neg Hx   ? Stomach cancer Neg Hx   ? Rectal cancer Neg Hx   ? ? ?Allergies  ?Allergen Reactions  ? Clindamycin/Lincomycin Itching and Other (See Comments)  ?  Dizziness, trouble swallowing  ? Codeine Itching and Nausea And Vomiting  ? Cyclobenzaprine Other (See Comments)  ?  Fast heartbeat, restless leg, nightmares  ? Gabapentin Other (See Comments)  ?  Headaches, visual disturbances.   ? Nyquil Multi-Symptom [Pseudoeph-Doxylamine-Dm-Apap] Other (See Comments)  ?  Heart racing, dizziness, weakness  ? Uribel [Meth-Hyo-M Bl-Na Phos-Ph Sal] Other (See Comments)  ?  Heart racing, dizziness, blurry vision.  ? Xanax [Alprazolam]   ?  Bruising  ? Urelle Nausea And Vomiting  ? ? ?Medication list has been reviewed and updated. ? ?Current Outpatient Medications on File Prior to Visit  ?Medication Sig Dispense Refill  ? Ascorbic Acid (VITAMIN C) 100 MG tablet Take 100 mg by mouth daily.    ? Baclofen 5 MG  TABS Take 2.5 mg by mouth 2 (two) times daily as needed. 30 tablet 0  ? clonazePAM (KLONOPIN) 0.5 MG tablet Take 1 tablet (0.5 mg total) by mouth 2 (two) times daily as needed for anxiety. 20 tablet 1

## 2021-07-20 NOTE — Patient Instructions (Signed)
It was good to see you again today, I will be in touch with your labs as soon as possible.  I do not see evidence of a significant vaginal infection at this time but we will certainly look at your lab results as well.  Please let me know if you are getting worse or not improving in the next couple of days, please continue to monitor your temperature ?

## 2021-07-21 LAB — URINE CULTURE
MICRO NUMBER:: 13284542
Result:: NO GROWTH
SPECIMEN QUALITY:: ADEQUATE

## 2021-07-22 ENCOUNTER — Inpatient Hospital Stay (HOSPITAL_BASED_OUTPATIENT_CLINIC_OR_DEPARTMENT_OTHER): Payer: 59 | Admitting: Hematology

## 2021-07-22 ENCOUNTER — Encounter: Payer: Self-pay | Admitting: Family Medicine

## 2021-07-22 ENCOUNTER — Other Ambulatory Visit: Payer: Self-pay | Admitting: Hematology

## 2021-07-22 DIAGNOSIS — C9210 Chronic myeloid leukemia, BCR/ABL-positive, not having achieved remission: Secondary | ICD-10-CM | POA: Diagnosis not present

## 2021-07-22 LAB — CERVICOVAGINAL ANCILLARY ONLY
Bacterial Vaginitis (gardnerella): NEGATIVE
Candida Glabrata: NEGATIVE
Candida Vaginitis: POSITIVE — AB
Chlamydia: NEGATIVE
Comment: NEGATIVE
Comment: NEGATIVE
Comment: NEGATIVE
Comment: NEGATIVE
Comment: NEGATIVE
Comment: NORMAL
Neisseria Gonorrhea: NEGATIVE
Trichomonas: NEGATIVE

## 2021-07-22 MED ORDER — FLUCONAZOLE 150 MG PO TABS: 150.0000 mg | ORAL_TABLET | Freq: Once | ORAL | 0 refills | Status: AC

## 2021-07-22 NOTE — Addendum Note (Signed)
Addended by: Lamar Blinks C on: 07/22/2021 01:22 PM ? ? Modules accepted: Orders ? ?

## 2021-07-24 ENCOUNTER — Encounter: Payer: Self-pay | Admitting: Family

## 2021-07-24 NOTE — Progress Notes (Signed)
? ? ?HEMATOLOGY/ONCOLOGY PHONE VISIT NOTE ? ?Date of Service: .07/22/2021 ? ? ?Patient Care Team: ?Copland, Gay Filler, MD as PCP - General (Family Medicine) ? ?CHIEF COMPLAINTS/PURPOSE OF CONSULTATION:  ?Follow-up for continued evaluation management of CML ? ?HISTORY OF PRESENTING ILLNESS:  ?Please see previous note for details of initial presentation. ? ?Interval History:  ? ?I connected with Victoria Hall on .07/22/2021 by telephone and verified that I am speaking with the correct person using two identifiers. ?  ?I discussed the limitations of evaluation and management by telemedicine. The patient expressed understanding and agreed to proceed. ?  ?Other persons participating in the visit and their role in the encounter:  none ?  ?Patient's location: Home ?Provider's location: [place] at [facility] ? ?[name] to follow-up on her CML and for lab review. ?She notes that she is feeling about the same as she has about 3 months ago. ?Still some intermittent leg cramps which are relatively manageable.  She has been using as needed clonazepam prescribed by her primary care physician. ?Has not used the baclofen and the medication was canceled. ?No infection issues. ?No significant new skin rashes. ?Overall patient feels she is tolerating her imatinib 200 mg p.o. daily reasonably well. ?Labs done on 07/07/2021 were discussed in detail with her.  BCR abl PCR still shows that she is still in major molecular remission. ? ?MEDICAL HISTORY:  ?Past Medical History:  ?Diagnosis Date  ? Anxiety   ? [facility] h[facility]- [facility] due to Low BP   ? Arthritis   ? Asthma   ? early teens only   ? Cervical intraepithelial neoplasia grade 3 2002  ? s/p LEEP Rx repeat pap negative  ? Cervical radiculopathy   ? CML (chronic myelocytic leukemia) ([place]) 06/03/2018  ? Fallopian tube disorder   ? right fallopian tube removed  ? Gallstones 09/2015  ? On CT  ? Headache   ? Migraines  ? Hemorrhagic ovarian cyst 02/08/2016  ? Hydrosalpinx    ? [facility]  ? Hydrosalpinx 02/08/2016  ? Interstitial cystitis   ? Neurological abnormality   ? Neg workup with MRI/MRA in 2007 WNL except decreased caliber MCA proximally, distal cavernous portion of left LCA which may represent true stenosis or technique on exam. Evaluated by Dr Linus Salmons.  ? Palpitations   ? Partial seizures ([place])   ? questionable diag  ? Pneumonia   ? walking pneumonia  ? Polycystic ovary   ? multiple ovarian cysts removed  ? S/P cervical discectomy   ? Dr Vertell Limber, anterior discectomy C5-C7  ? S/P epidural steroid injection   ? Right hip  ? Seizures ([place]) 2005  ? simple partial seizure disorder-was mast cell problems per pt 09-15-2019- no seizures per pt   ? ? ?SURGICAL HISTORY: ?Past Surgical History:  ?Procedure Laterality Date  ? APPENDECTOMY  1986  ? BREAST BIOPSY Right 09/2019  ? BUNIONECTOMY Left   ? bunion removal  ? [place] SURGERY  2005  ? C5-C7  ? COLONOSCOPY    ? core breast biopsy     ? DENTAL SURGERY    ? fallopian tue removed    ? LAPAROSCOPIC BILATERAL SALPINGECTOMY Left 02/09/2016  ? Procedure: LAPAROSCOPIC LEFT  SALPINGECTOMY;  Surgeon: Janyth Contes, MD;  Location: [facility];  Service: Gynecology;  Laterality: Left;  ? LAPAROSCOPIC LYSIS OF ADHESIONS  02/09/2016  ? Procedure: LAPAROSCOPIC LYSIS OF ADHESIONS;  Surgeon: Janyth Contes, MD;  Location: [facility];  Service: Gynecology;;  momentum to adenexa  ? LAPAROSCOPIC OVARIAN CYSTECTOMY Right 02/09/2016  ? Procedure: LAPAROSCOPIC OVARIAN CYSTECTOMY;  Surgeon: Janyth Contes, MD;  Location: [facility];  Service: Gynecology;  Laterality: Right;  x2 to right ovary  ? LEEP  1998  ? OOPHORECTOMY Left 02/09/2016  ? Procedure: LEFT OOPHORECTOMY;  Surgeon: Janyth Contes, MD;  Location: [facility];  Service: Gynecology;  Laterality: Left;- pt states left ovary has NOT been removed   ? POLYPECTOMY    ? TONSILLECTOMY  2001  ? UPPER GASTROINTESTINAL ENDOSCOPY    ? UPPER GI ENDOSCOPY    ? ? ?SOCIAL  HISTORY: ?Social History  ? ?Socioeconomic History  ? Marital status: Married  ?  Spouse name: Not on file  ? Number of children: 0  ? Years of education: Not on file  ? Highest education level: Not on file  ?Occupational History  ? Occupation: novelist  ?  Comment: has been accepted into [employer]..  ? Occupation: Pharmacist, hospital  ? Occupation: Curator  ?Tobacco Use  ? Smoking status: Former  ?  Packs/day: 0.00  ?  Years: 0.00  ?  Pack years: 0.00  ?  Types: Cigarettes  ?  Quit date: 11/02/2015  ?  Years since quitting: 5.7  ? Smokeless tobacco: Never  ? Tobacco comments:  ?  currently smoking, began at beginning of June and plans to quit on June 14th. Has smoked off and on in the past.  ?Vaping Use  ? Vaping Use: Every day  ?Substance and Sexual Activity  ? Alcohol use: Yes  ?  Alcohol/week: 1.0 - 2.0 standard drink  ?  Types: 1 - 2 Standard drinks or equivalent per week  ?  Comment: occasionally 0-2 per day- rare   ? Drug use: No  ? Sexual activity: Yes  ?Other Topics Concern  ? Not on file  ?Social History Narrative  ? Not on file  ? ?Social Determinants of Health  ? ?Financial Resource Strain: Not on file  ?Food Insecurity: Not on file  ?Transportation Needs: Not on file  ?Physical Activity: Not on file  ?Stress: Not on file  ?Social Connections: Not on file  ?Intimate Partner Violence: Not on file  ? ? ?FAMILY HISTORY: ?Family History  ?Adopted: Yes  ?Problem Relation Age of Onset  ? COPD Mother   ? Breast cancer Mother   ? Other Mother   ?     Teeth problems- all removed by age 57  ? Rashes / Skin problems Sister   ? Other Sister   ?     Teeth problems- all removed by age 32  ? Rashes / Skin problems Brother   ? Asthma Brother   ? Other Brother   ?     Teeth problems- all removed by age 63  ? Breast cancer Maternal Aunt   ? Adrenal disorder Neg Hx   ? Colon cancer Neg Hx   ? Colon polyps Neg Hx   ? Esophageal cancer Neg Hx   ? Stomach cancer Neg Hx   ? Rectal cancer Neg Hx   ? ? ?ALLERGIES:   is allergic to clindamycin/lincomycin, codeine, cyclobenzaprine, gabapentin, nyquil multi-symptom [pseudoeph-doxylamine-dm-apap], uribel [meth-hyo-m bl-na phos-ph sal], xanax [alprazolam], and urelle. ? ?MEDICATIONS:  ?Current Outpatient Medications  ?Medication Sig Dispense Refill  ? Ascorbic Acid (VITAMIN C) 100 MG tablet Take 100 mg by mouth daily.    ? clonazePAM (KLONOPIN) 0.5 MG tablet Take 1 tablet (0.5 mg total) by mouth 2 (two)  times daily as needed for anxiety. 20 tablet 1  ? Copper Gluconate (COPPER CAPS) 2 MG CAPS Take 2 mg by mouth as directed. 80 capsule 0  ? estradiol (ESTRACE VAGINAL) 0.1 MG/GM vaginal cream Place 0.5 g vaginally at bedtime. Use 2-3x weekly as needed to maintain vaginal comfort 42.5 g 12  ? famotidine (PEPCID) 20 MG tablet Take 10 mg by mouth daily.    ? GLEEVEC 100 MG tablet TAKE 2 TABLETS BY MOUTH ONCE DAILY AT THE SAME TIME WITH FOOD AND A LARGE GLASS OF WATER. AVOID GRAPEFRUIT PRODUCTS. 60 tablet 1  ? loratadine (CLARITIN) 10 MG tablet Take 10 mg by mouth daily.    ? magnesium oxide (MAG-OX) 400 (240 Mg) MG tablet TAKE 1 TABLET BY MOUTH EVERY DAY 90 tablet 1  ? Oyster Shell Calcium 500 MG TABS Please specify directions, refills and quantity 30 tablet 2  ? ?No current facility-administered medications for this visit.  ? ? ?REVIEW OF SYSTEMS:   ?10 Point review of Systems was done is negative except as noted above. ? ? ?PHYSICAL EXAMINATION: ?Telehealth Visit.  ? ?LABORATORY DATA:  ?I have reviewed the data as listed ? ? ? ?  Latest Ref Rng & Units 07/20/2021  ?  3:59 PM 07/07/2021  ?  2:07 PM 04/05/2021  ?  2:44 PM  ?CBC  ?WBC 3.8 - 10.8 Thousand/uL 8.4   7.8   7.9    ?Hemoglobin 11.7 - 15.5 g/dL 13.5   13.7   13.4    ?Hematocrit 35.0 - 45.0 % 39.6   38.8   38.4    ?Platelets 140 - 400 Thousand/uL 323   310   312    ? ? ?CBC ?   ?Component Value Date/Time  ? WBC 8.4 07/20/2021 1559  ? RBC 4.47 07/20/2021 1559  ? HGB 13.5 07/20/2021 1559  ? HGB 13.4 04/05/2021 1444  ? HGB 15.1  03/26/2017 1101  ? HCT 39.6 07/20/2021 1559  ? HCT 44.0 03/26/2017 1101  ? PLT 323 07/20/2021 1559  ? PLT 312 04/05/2021 1444  ? PLT 330 03/26/2017 1101  ? MCV 88.6 07/20/2021 1559  ? MCV 84 03/26/2017 1101  ? MCH 30.2

## 2021-07-25 ENCOUNTER — Other Ambulatory Visit: Payer: Self-pay | Admitting: Hematology

## 2021-07-25 DIAGNOSIS — C9210 Chronic myeloid leukemia, BCR/ABL-positive, not having achieved remission: Secondary | ICD-10-CM

## 2021-08-04 ENCOUNTER — Other Ambulatory Visit: Payer: Self-pay | Admitting: Family Medicine

## 2021-08-04 DIAGNOSIS — Z1231 Encounter for screening mammogram for malignant neoplasm of breast: Secondary | ICD-10-CM

## 2021-09-08 ENCOUNTER — Telehealth: Payer: Self-pay | Admitting: Hematology

## 2021-09-08 NOTE — Telephone Encounter (Signed)
Called patient regarding upcoming July appointment, patient is notified. 

## 2021-09-12 ENCOUNTER — Other Ambulatory Visit: Payer: Self-pay | Admitting: Hematology

## 2021-09-12 DIAGNOSIS — C9210 Chronic myeloid leukemia, BCR/ABL-positive, not having achieved remission: Secondary | ICD-10-CM

## 2021-09-14 ENCOUNTER — Ambulatory Visit
Admission: RE | Admit: 2021-09-14 | Discharge: 2021-09-14 | Disposition: A | Discharge status: Home / Self Care | Payer: 59 | Source: Ambulatory Visit | Attending: Family Medicine | Admitting: Family Medicine

## 2021-09-14 DIAGNOSIS — Z1231 Encounter for screening mammogram for malignant neoplasm of breast: Secondary | ICD-10-CM

## 2021-10-10 ENCOUNTER — Telehealth: Payer: Self-pay | Admitting: Hematology

## 2021-10-10 NOTE — Telephone Encounter (Signed)
Left message with rescheduled upcoming appointment due to provider's PAL. 

## 2021-10-13 ENCOUNTER — Other Ambulatory Visit: Payer: Self-pay

## 2021-10-13 DIAGNOSIS — C9210 Chronic myeloid leukemia, BCR/ABL-positive, not having achieved remission: Secondary | ICD-10-CM

## 2021-10-14 ENCOUNTER — Inpatient Hospital Stay: Payer: 59 | Attending: Family

## 2021-10-14 DIAGNOSIS — C9210 Chronic myeloid leukemia, BCR/ABL-positive, not having achieved remission: Secondary | ICD-10-CM | POA: Insufficient documentation

## 2021-10-14 LAB — CBC WITH DIFFERENTIAL (CANCER CENTER ONLY)
Abs Immature Granulocytes: 0.02 10*3/uL (ref 0.00–0.07)
Basophils Absolute: 0.1 10*3/uL (ref 0.0–0.1)
Basophils Relative: 1 %
Eosinophils Absolute: 0.3 10*3/uL (ref 0.0–0.5)
Eosinophils Relative: 4 %
HCT: 38.7 % (ref 36.0–46.0)
Hemoglobin: 13.9 g/dL (ref 12.0–15.0)
Immature Granulocytes: 0 %
Lymphocytes Relative: 41 %
Lymphs Abs: 3.3 10*3/uL (ref 0.7–4.0)
MCH: 30.3 pg (ref 26.0–34.0)
MCHC: 35.9 g/dL (ref 30.0–36.0)
MCV: 84.5 fL (ref 80.0–100.0)
Monocytes Absolute: 0.4 10*3/uL (ref 0.1–1.0)
Monocytes Relative: 5 %
Neutro Abs: 3.9 10*3/uL (ref 1.7–7.7)
Neutrophils Relative %: 49 %
Platelet Count: 336 10*3/uL (ref 150–400)
RBC: 4.58 MIL/uL (ref 3.87–5.11)
RDW: 13 % (ref 11.5–15.5)
WBC Count: 8 10*3/uL (ref 4.0–10.5)
nRBC: 0 % (ref 0.0–0.2)

## 2021-10-14 LAB — CMP (CANCER CENTER ONLY)
ALT: 27 U/L (ref 0–44)
AST: 21 U/L (ref 15–41)
Albumin: 4.2 g/dL (ref 3.5–5.0)
Alkaline Phosphatase: 75 U/L (ref 38–126)
Anion gap: 6 (ref 5–15)
BUN: 7 mg/dL (ref 6–20)
CO2: 27 mmol/L (ref 22–32)
Calcium: 9.3 mg/dL (ref 8.9–10.3)
Chloride: 106 mmol/L (ref 98–111)
Creatinine: 1.05 mg/dL — ABNORMAL HIGH (ref 0.44–1.00)
GFR, Estimated: >60 mL/min (ref >60)
Glucose, Bld: 115 mg/dL — ABNORMAL HIGH (ref 70–99)
Potassium: 3.9 mmol/L (ref 3.5–5.1)
Sodium: 139 mmol/L (ref 135–145)
Total Bilirubin: 0.7 mg/dL (ref 0.3–1.2)
Total Protein: 6.8 g/dL (ref 6.5–8.1)

## 2021-10-20 LAB — BCR/ABL

## 2021-10-21 ENCOUNTER — Inpatient Hospital Stay: Payer: 59 | Admitting: Hematology

## 2021-11-10 ENCOUNTER — Other Ambulatory Visit: Payer: Self-pay | Admitting: Hematology

## 2021-11-10 DIAGNOSIS — C9210 Chronic myeloid leukemia, BCR/ABL-positive, not having achieved remission: Secondary | ICD-10-CM

## 2021-11-11 ENCOUNTER — Inpatient Hospital Stay: Payer: 59 | Attending: Family | Admitting: Hematology

## 2021-11-11 DIAGNOSIS — C9210 Chronic myeloid leukemia, BCR/ABL-positive, not having achieved remission: Secondary | ICD-10-CM | POA: Insufficient documentation

## 2021-11-17 ENCOUNTER — Encounter: Payer: Self-pay | Admitting: Family

## 2021-11-17 NOTE — Progress Notes (Signed)
HEMATOLOGY/ONCOLOGY PHONE VISIT NOTE  Date of Service: .11/11/2021   Patient Care Team: Copland, Gay Filler, MD as PCP - General (Family Medicine)  CHIEF COMPLAINTS/PURPOSE OF CONSULTATION:  Follow-up for continued evaluation and management of CML  HISTORY OF PRESENTING ILLNESS:  Please see previous note for details of initial presentation.  Interval History:   I connected with Arne Cleveland on .11/11/2021 by telephone and verified that I am speaking with the correct person using two identifiers.   I discussed the limitations of evaluation and management by telemedicine. The patient expressed understanding and agreed to proceed.   Other persons participating in the visit and their role in the encounter:  none   Patient's location: Home Provider's location: [place] [facility] was called to follow-up on her CML and to evaluate her recent labs. She is scheduled to be seen in person but the visit was switched to a phone visit as per her preferences. She notes no acute new symptoms or issues related to her CML. Still with some intermittent muscle cramps. No new progressive rashes. No other acute new notable toxicities from her current dose of Gleevec at 200 mg p.o. daily. She notes easy tearing of her anal and vaginal mucosa.  She has been seen by OB/GYN and was given option for estrogen ointment for concerns with atrophic vaginitis. She also has some mild soreness in her mouth but no obvious ulceration or discomfort. Notes no other new medications. No new infection issues.  Labs done on 10/14/2021 were discussed in detail with the patient.  MEDICAL HISTORY:  Past Medical History:  Diagnosis Date   Anxiety    [facility]- [facility] due [facility] Low BP    Arthritis    Asthma    early teens only    Cervical intraepithelial neoplasia grade 3 2002   s/p LEEP Rx repeat pap negative   Cervical radiculopathy    CML (chronic myelocytic leukemia)  ([place]) 06/03/2018   Fallopian tube disorder    right fallopian tube removed   Gallstones 09/2015   On CT   Headache    Migraines   Hemorrhagic ovarian cyst 02/08/2016   Hydrosalpinx    [facility]   Hydrosalpinx 02/08/2016   Interstitial cystitis    Neurological abnormality    Neg workup with MRI/MRA in 2007 WNL except decreased caliber MCA proximally, distal cavernous portion of left LCA which may represent true stenosis or technique on exam. Evaluated by Dr Linus Salmons.   Palpitations    Partial seizures ([facility])    questionable diag   Pneumonia    walking pneumonia   Polycystic ovary    multiple ovarian cysts removed   S/P cervical discectomy    Dr Vertell Limber, anterior discectomy C5-C7   S/P epidural steroid injection    Right hip   Seizures ([place]) 2005   simple partial seizure disorder-was mast cell problems per pt 09-15-2019- no seizures per pt     SURGICAL HISTORY: Past Surgical History:  Procedure Laterality Date   APPENDECTOMY  1986   BREAST BIOPSY Right 09/2019   BUNIONECTOMY Left    bunion removal   CERVICAL DISC SURGERY  2005   C5-C7   COLONOSCOPY     core breast biopsy      DENTAL SURGERY     fallopian tue removed     LAPAROSCOPIC BILATERAL SALPINGECTOMY Left 02/09/2016   Procedure: LAPAROSCOPIC LEFT  SALPINGECTOMY;  Surgeon: Janyth Contes, MD;  Location:  [facility];  Service: Gynecology;  Laterality: Left;   LAPAROSCOPIC LYSIS OF ADHESIONS  02/09/2016   Procedure: LAPAROSCOPIC LYSIS OF ADHESIONS;  Surgeon: Janyth Contes, MD;  Location: [facility];  Service: Gynecology;;  momentum to adenexa   LAPAROSCOPIC OVARIAN CYSTECTOMY Right 02/09/2016   Procedure: LAPAROSCOPIC OVARIAN CYSTECTOMY;  Surgeon: Janyth Contes, MD;  Location: [facility];  Service: Gynecology;  Laterality: Right;  x2 to right ovary   LEEP  1998   OOPHORECTOMY Left 02/09/2016   Procedure: LEFT OOPHORECTOMY;  Surgeon: Janyth Contes, MD;  Location: [facility];  Service: Gynecology;   Laterality: Left;- pt states left ovary has NOT been removed    POLYPECTOMY     TONSILLECTOMY  2001   UPPER GASTROINTESTINAL ENDOSCOPY     UPPER GI ENDOSCOPY      SOCIAL HISTORY: Social History   Socioeconomic History   Marital status: Married    Spouse name: Not on file   Number of children: 0   Years of education: Not on file   Highest education level: Not on file  Occupational History   Occupation: novelist    Comment: has been accepted into [employer]..   Occupation: Pharmacist, hospital   Occupation: Curator  Tobacco Use   Smoking status: Former    Packs/day: 0.00    Years: 0.00    Total pack years: 0.00    Types: Cigarettes    Quit date: 11/02/2015    Years since quitting: 6.0   Smokeless tobacco: Never   Tobacco comments:    currently smoking, began at beginning of June and plans to quit on June 14th. Has smoked off and on in the past.  Vaping Use   Vaping Use: Every day  Substance and Sexual Activity   Alcohol use: Yes    Alcohol/week: 1.0 - 2.0 standard drink of alcohol    Types: 1 - 2 Standard drinks or equivalent per week    Comment: occasionally 0-2 per day- rare    Drug use: No   Sexual activity: Yes  Other Topics Concern   Not on file  Social History Narrative   Not on file   Social Determinants of Health   Financial Resource Strain: Not on file  Food Insecurity: Not on file  Transportation Needs: Not on file  Physical Activity: Not on file  Stress: Not on file  Social Connections: Not on file  Intimate Partner Violence: Not on file    FAMILY HISTORY: Family History  Adopted: Yes  Problem Relation Age of Onset   COPD Mother    Breast cancer Mother    Other Mother        Teeth problems- all removed by age 82   Rashes / Skin problems Sister    Other Sister        Teeth problems- all removed by age 57   Rashes / Skin problems Brother    Asthma Brother    Other Brother        Teeth problems- all removed by age 82   Breast  cancer Maternal Aunt    Adrenal disorder Neg Hx    Colon cancer Neg Hx    Colon polyps Neg Hx    Esophageal cancer Neg Hx    Stomach cancer Neg Hx    Rectal cancer Neg Hx     ALLERGIES:  is allergic to clindamycin/lincomycin, codeine, cyclobenzaprine, gabapentin, nyquil multi-symptom [pseudoeph-doxylamine-dm-apap], uribel [meth-hyo-m bl-na phos-ph sal], xanax [alprazolam], and urelle.  MEDICATIONS:  Current Outpatient Medications  Medication Sig Dispense Refill   Ascorbic Acid (VITAMIN C) 100 MG tablet Take 100 mg by mouth daily.     clonazePAM (KLONOPIN) 0.5 MG tablet Take 1 tablet (0.5 mg total) by mouth 2 (two) times daily as needed for anxiety. 20 tablet 1   Copper Gluconate (COPPER CAPS) 2 MG CAPS Take 2 mg by mouth as directed. 80 capsule 0   estradiol (ESTRACE VAGINAL) 0.1 MG/GM vaginal cream Place 0.5 g vaginally at bedtime. Use 2-3x weekly as needed to maintain vaginal comfort 42.5 g 12   famotidine (PEPCID) 20 MG tablet Take 10 mg by mouth daily.     GLEEVEC 100 MG tablet TAKE 2 TABLETS BY MOUTH ONCE DAILY AT THE SAME TIME WITH FOOD AND A LARGE GLASS OF WATER. AVOID GRAPEFRUIT PRODUCTS. 60 tablet 1   loratadine (CLARITIN) 10 MG tablet Take 10 mg by mouth daily.     magnesium oxide (MAG-OX) 400 (240 Mg) MG tablet TAKE 1 TABLET BY MOUTH EVERY DAY 90 tablet 1   Oyster Shell Calcium 500 MG TABS Please specify directions, refills and quantity 30 tablet 2   No current facility-administered medications for this visit.    REVIEW OF SYSTEMS:   10 Point review of Systems was done is negative except as noted above.   PHYSICAL EXAMINATION: Telehealth Visit.   LABORATORY DATA:  I have reviewed the data as listed      Latest Ref Rng & Units 10/14/2021   12:07 PM 07/20/2021    3:59 PM 07/07/2021    2:07 PM  CBC  WBC 4.0 - 10.5 K/uL 8.0  8.4  7.8   Hemoglobin 12.0 - 15.0 g/dL 13.9  13.5  13.7   Hematocrit 36.0 - 46.0 % 38.7  39.6  38.8   Platelets 150 - 400 K/uL 336  323  310      CBC    Component Value Date/Time   WBC 8.0 10/14/2021 1207   WBC 8.4 07/20/2021 1559   RBC 4.58 10/14/2021 1207   HGB 13.9 10/14/2021 1207   HGB 15.1 03/26/2017 1101   HCT 38.7 10/14/2021 1207   HCT 44.0 03/26/2017 1101   PLT 336 10/14/2021 1207   PLT 330 03/26/2017 1101   MCV 84.5 10/14/2021 1207   MCV 84 03/26/2017 1101   MCH 30.3 10/14/2021 1207   MCHC 35.9 10/14/2021 1207   RDW 13.0 10/14/2021 1207   RDW 14.0 03/26/2017 1101   LYMPHSABS 3.3 10/14/2021 1207   LYMPHSABS 2.8 03/26/2017 1101   MONOABS 0.4 10/14/2021 1207   EOSABS 0.3 10/14/2021 1207   EOSABS 0.4 03/26/2017 1101   BASOSABS 0.1 10/14/2021 1207   BASOSABS 0.2 03/26/2017 1101     .    Latest Ref Rng & Units 10/14/2021   12:07 PM 07/07/2021    2:07 PM 04/05/2021    2:44 PM  CMP  Glucose 70 - 99 mg/dL 115  97  109   BUN 6 - 20 mg/dL '7  11  9   '$ Creatinine 0.44 - 1.00 mg/dL 1.05  0.98  0.94   Sodium 135 - 145 mmol/L 139  139  139   Potassium 3.5 - 5.1 mmol/L 3.9  4.1  3.9   Chloride 98 - 111 mmol/L 106  104  106   CO2 22 - 32 mmol/L '27  30  28   '$ Calcium 8.9 - 10.3 mg/dL 9.3  9.4  9.3   Total Protein 6.5 - 8.1 g/dL 6.8  7.0  6.9  Total Bilirubin 0.3 - 1.2 mg/dL 0.7  0.5  0.4   Alkaline Phos 38 - 126 U/L 75  79  78   AST 15 - 41 U/L '21  22  22   '$ ALT 0 - 44 U/L '27  27  27     '$ 05/16/18 Cytogenetics:   04/10/18 Hemochromatosis Panel:    RADIOGRAPHIC STUDIES: I have personally reviewed the radiological images as listed and agreed with the findings in the report. No results found.  01/31/18 ECHO Transthoracic:     ASSESSMENT & PLAN:   51 y.o. female with  1. CML- Chronic phase 05/16/18 BM Cytogenetics revealed translocation 9;22. FISH report revealed BCR-ABL gene rearrangement present in 86.5% of cells 06/03/18 FISH revealed BCR-ABL gene rearrangement present in 85.67% of cells, with some hypercellularity around 80% 06/05/18 US Abdomen revealed normal spleen size 01/31/18 ECHO revealed LV EF of  63%, no regional wall motion abnormalities, no significant valvular hear disease  Initial pre-treatment 07/25/18 BCR-ABL revealed IS at 71.1362%  08/28/18 EKG reviewed  2. 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 Mild copper deficiency- replaced.  PLAN: -Patient still having grade 1 fairly tolerable muscle cramps at this time. Labs done show CBC and CMP are stable BCR-ABL PCR still continues to show major molecular remission. No other acute new symptoms suggestive of CML progression. She is having issues with what sounds like atrophic urethral vaginitis.  Has been following up with OB/GYN and has been given vaginal estrogen cream. No infection issues.  No other significant new toxicities from her current dose of Gleevec. Patient appropriate to continue Gleevec 200 mg p.o. daily at this time. Continue current vitamin D and copper replacement Reasonable to take B complex 1 capsule p.o. daily  FOLLOW UP: Return to clinic with Dr. Irene Limbo with labs in 3 months Please schedule labs 2 weeks prior to phone visit.    The total time spent in the appointment was 20 minutes*.  All of the patient's questions were answered with apparent satisfaction. The patient knows to call the clinic with any problems, questions or concerns.   Sullivan Lone MD MS AAHIVMS [facility] Hematology/Oncology Physician [facility]  .*Total Encounter Time as defined by the Centers for Medicare and Medicaid Services includes, in addition to the face-to-face time of a patient visit (documented in the note above) non-face-to-face time: obtaining and reviewing outside history, ordering and reviewing medications, tests or procedures, care coordination (communications with other health care professionals or caregivers) and documentation in the medical record.

## 2022-01-13 ENCOUNTER — Other Ambulatory Visit: Payer: Self-pay | Admitting: Hematology

## 2022-01-13 DIAGNOSIS — C9210 Chronic myeloid leukemia, BCR/ABL-positive, not having achieved remission: Secondary | ICD-10-CM

## 2022-01-20 NOTE — Progress Notes (Unsigned)
[place] [facility] [address] [place], [place] [address] [phone]  Date:  01/23/2022   Name:  Victoria Hall   DOB:  Jun 14, 1970   MRN:  [id number]  PCP:  Darreld Mclean, MD    Chief Complaint: Vaginal Pain (Discomfort has returned. Pt says she saw GYN and she thinks she is intolerant to Estradiol now and that she should try: Revaree Suppository, and a Volvadinia Gel. She also says they think this is a connective tissue disorder and she should see Genetics. She asks to be checked for another yeast infection. )   History of Present Illness:  Victoria Hall is a 51 y.o. very pleasant female patient who presents with the following:  Patient seen today with concern of recurrent vaginal discomfort Patient with history of CML, POTS, mast cell activation syndrome, hypermobility, hemochromatosis, chronic fatigue Her oncologist is Dr. Irene Limbo  Most recent visit with myself was in April of this year.  At that time she was struggling vaginal itching.  I had given her some vaginal estrogen cream but she had not started on as of yet She was positive for yeast vaginitis at last visit  Today pt notes she tried the vaginal estrogen about a month ago but unfortunately it seemed to cause a bad reaction.  She saw her GYN Dr. Orvan Seen just recently who agreed it did not seem to be helping.  They recommended that she stop the estrogen They wonder if this might be more af a connective tissue disorder -she notes easy tearing of the skin on her vulva and near her rectum and general fragility of her tissues  Dr Julien Girt is having her try a a baclofen and amitriptyline compounded cream for vulvodynia  Seen by her oncologist in August:   1. CML- Chronic phase 05/16/18 BM Cytogenetics revealed translocation 9;22. FISH report revealed BCR-ABL gene rearrangement present in 86.5% of cells 06/03/18 FISH revealed BCR-ABL gene rearrangement  present in 85.67% of cells, with some hypercellularity around 80% 06/05/18 US Abdomen revealed normal spleen size 01/31/18 ECHO revealed LV EF of 63%, no regional wall motion abnormalities, no significant valvular hear disease Initial pre-treatment 07/25/18 BCR-ABL revealed IS at 71.1362% 2. 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 Mild copper deficiency- replaced. PLAN: -Patient still having grade 1 fairly tolerable muscle cramps at this time. Labs done show CBC and CMP are stable BCR-ABL PCR still continues to show major molecular remission. No other acute new symptoms suggestive of CML progression. She is having issues with what sounds like atrophic urethral vaginitis.  Has been following up with OB/GYN and has been given vaginal estrogen cream. No infection issues.  No other significant new toxicities from her current dose of Gleevec. Patient appropriate to continue Gleevec 200 mg p.o. daily at this time. Continue current vitamin D and copper replacement Reasonable to take B complex 1 capsule p.o. daily   FOLLOW UP: Return to clinic with Dr. Irene Limbo with labs in 3 months Please schedule labs 2 weeks prior to phone visit. Patient Active Problem List   Diagnosis Date Noted   Flushing 08/08/2019   CML (chronic myelocytic leukemia) ([place]) 06/03/2018   Hemochromatosis 04/18/2018   Hypermobility syndrome 09/26/2017   Mast cell activation syndrome ([place]) 09/26/2017   POTS (postural orthostatic tachycardia syndrome) 08/10/2017   Incomplete right bundle branch block 07/11/2017   Fatigue 07/11/2017   Low back pain 07/21/2016  Right knee pain 06/05/2016   Left shoulder pain 06/05/2016   Hydrosalpinx 02/08/2016   Hemorrhagic ovarian cyst 02/08/2016   Right hip pain 02/03/2016   HYDROSALPINX 02/07/2008   HEADACHE 10/28/2007   TACHYCARDIA 10/14/2007   ANXIETY DISORDER 05/28/2007   POLYARTHRITIS 05/09/2007   UNSPECIFIED DISORDERS OF  NERVOUS SYSTEM 04/22/2007   ABDOMINAL PAIN RIGHT LOWER QUADRANT 04/08/2007   PALPITATIONS, RECURRENT 11/07/2006   PROBLEMS W/SMELL/TASTE 06/29/2006   TOBACCO ABUSE 01/08/2006   VISUAL IMPAIRMENT 01/08/2006   DISTURBANCE, VISUAL NOS 01/08/2006   FIBROCYSTIC BREAST DISEASE 01/08/2006   [place] DISEASE, CERVICAL 01/08/2006   VERTIGO 01/08/2006    Past Medical History:  Diagnosis Date   Anxiety    [facility]- [facility] Low BP    Arthritis    Asthma    early teens only    Cervical intraepithelial neoplasia grade 3 2002   s/p LEEP Rx repeat pap negative   Cervical radiculopathy    CML (chronic myelocytic leukemia) ([place]) 06/03/2018   Fallopian tube disorder    right fallopian tube removed   Gallstones 09/2015   On CT   Headache    Migraines   Hemorrhagic ovarian cyst 02/08/2016   Hydrosalpinx    [facility]   Hydrosalpinx 02/08/2016   Interstitial cystitis    Neurological abnormality    Neg workup with MRI/MRA in 2007 WNL except decreased caliber MCA proximally, distal cavernous portion of left LCA which may represent true stenosis or technique on exam. Evaluated by Dr Linus Salmons.   Palpitations    Partial seizures (HCC)    questionable diag   Pneumonia    walking pneumonia   Polycystic ovary    multiple ovarian cysts removed   S/P cervical discectomy    Dr Vertell Limber, anterior discectomy C5-C7   S/P epidural steroid injection    Right hip   Seizures ([place]) 2005   simple partial seizure disorder-was mast cell problems per pt 09-15-2019- no seizures per pt     Past Surgical History:  Procedure Laterality Date   APPENDECTOMY  1986   BREAST BIOPSY Right 09/2019   BUNIONECTOMY Left    bunion removal   CERVICAL DISC SURGERY  2005   C5-C7   COLONOSCOPY     core breast biopsy      DENTAL SURGERY     fallopian tue removed     LAPAROSCOPIC BILATERAL SALPINGECTOMY Left 02/09/2016   Procedure: LAPAROSCOPIC LEFT  SALPINGECTOMY;  Surgeon: Janyth Contes,  MD;  Location: [facility];  Service: Gynecology;  Laterality: Left;   LAPAROSCOPIC LYSIS OF ADHESIONS  02/09/2016   Procedure: LAPAROSCOPIC LYSIS OF ADHESIONS;  Surgeon: Janyth Contes, MD;  Location: [facility];  Service: Gynecology;;  momentum to adenexa   LAPAROSCOPIC OVARIAN CYSTECTOMY Right 02/09/2016   Procedure: LAPAROSCOPIC OVARIAN CYSTECTOMY;  Surgeon: Janyth Contes, MD;  Location: [facility];  Service: Gynecology;  Laterality: Right;  x2 to right ovary   LEEP  1998   OOPHORECTOMY Left 02/09/2016   Procedure: LEFT OOPHORECTOMY;  Surgeon: Janyth Contes, MD;  Location: [facility];  Service: Gynecology;  Laterality: Left;- pt states left ovary has NOT been removed    POLYPECTOMY     TONSILLECTOMY  2001   UPPER GASTROINTESTINAL ENDOSCOPY     UPPER GI ENDOSCOPY      Social History   Tobacco Use   Smoking status: Former    Packs/day: 0.00    Years: 0.00    Total pack years: 0.00  Types: Cigarettes    Quit date: 11/02/2015    Years since quitting: 6.2   Smokeless tobacco: Never   Tobacco comments:    currently smoking, began at beginning of June and plans to quit on June 14th. Has smoked off and on in the past.  Vaping Use   Vaping Use: Every day  Substance Use Topics   Alcohol use: Yes    Alcohol/week: 1.0 - 2.0 standard drink of alcohol    Types: 1 - 2 Standard drinks or equivalent per week    Comment: occasionally 0-2 per day- rare    Drug use: No    Family History  Adopted: Yes  Problem Relation Age of Onset   COPD Mother    Breast cancer Mother    Other Mother        Teeth problems- all removed by age 36   Rashes / Skin problems Sister    Other Sister        Teeth problems- all removed by age 30   Rashes / Skin problems Brother    Asthma Brother    Other Brother        Teeth problems- all removed by age 52   Breast cancer Maternal Aunt    Adrenal disorder Neg Hx    Colon cancer Neg Hx    Colon polyps Neg Hx    Esophageal cancer Neg Hx    Stomach cancer  Neg Hx    Rectal cancer Neg Hx     Allergies  Allergen Reactions   Clindamycin/Lincomycin Itching and Other (See Comments)    Dizziness, trouble swallowing   Codeine Itching and Nausea And Vomiting   Cyclobenzaprine Other (See Comments)    Fast heartbeat, restless leg, nightmares   Gabapentin Other (See Comments)    Headaches, visual disturbances.    Nyquil Multi-Symptom [Pseudoeph-Doxylamine-Dm-Apap] Other (See Comments)    Heart racing, dizziness, weakness   Uribel [Meth-Hyo-M Bl-Na Phos-Ph Sal] Other (See Comments)    Heart racing, dizziness, blurry vision.   Xanax [Alprazolam]     Bruising   Urelle Nausea And Vomiting    Medication list has been reviewed and updated.  Current Outpatient Medications on File Prior to Visit  Medication Sig Dispense Refill   Ascorbic Acid (VITAMIN C) 100 MG tablet Take 100 mg by mouth daily.     clonazePAM (KLONOPIN) 0.5 MG tablet Take 1 tablet (0.5 mg total) by mouth 2 (two) times daily as needed for anxiety. 20 tablet 1   Copper Gluconate (COPPER CAPS) 2 MG CAPS Take 2 mg by mouth as directed. 80 capsule 0   famotidine (PEPCID) 20 MG tablet Take 10 mg by mouth daily.     GLEEVEC 100 MG tablet TAKE 2 TABLETS BY MOUTH ONCE DAILY AT THE SAME TIME WITH FOOD AND A LARGE GLASS OF WATER. AVOID GRAPEFRUIT PRODUCTS. 60 tablet 1   loratadine (CLARITIN) 10 MG tablet Take 10 mg by mouth daily.     magnesium oxide (MAG-OX) 400 (240 Mg) MG tablet TAKE 1 TABLET BY MOUTH EVERY DAY 90 tablet 1   Oyster Shell Calcium 500 MG TABS Please specify directions, refills and quantity 30 tablet 2   No current facility-administered medications on file prior to visit.    Review of Systems:  As per HPI- otherwise negative.   Physical Examination: Vitals:   01/23/22 1330  BP: 116/70  Pulse: 70  Temp: 97.8 F (36.6 C)  SpO2: 97%   Vitals:   01/23/22 1330  Weight:  190 lb (86.2 kg)  Height: '5\' 6"'$  (1.676 m)   Body mass index is 30.67 kg/m. Ideal Body  Weight: Weight in (lb) to have BMI = 25: 154.6  GEN: no acute distress.  Mild obesity, looks well HEENT: Atraumatic, Normocephalic.  Ears and Nose: No external deformity. CV: RRR, No M/G/R. No JVD. No thrill. No extra heart sounds. PULM: CTA B, no wheezes, crackles, rhonchi. No retractions. No resp. distress. No accessory muscle use. ABD: S, NT, ND, +BS. No rebound. No HSM. EXTR: No c/c/e PSYCH: Normally interactive. Conversant.  Defer GYN exam as this was just done a couple of days ago per her gynecologist Patient also notes a tender bump on the ulnar aspect of her left wrist.  She is not aware of any injury.  Her left wrist seems larger than her right, it hurts to press on this 1 particular area  Assessment and Plan: Vaginal itching - Plan: fluconazole (DIFLUCAN) 150 MG tablet, Cervicovaginal ancillary only( [place])  Left wrist pain - Plan: Ambulatory referral to Hand Surgery  Connective tissue anomaly - Plan: Antinuclear Antib (ANA), Ambulatory referral to Genetics  Patient seen today for follow-up.  She notes continued vaginal itching and sensitivity.  Previously she did have a yeast vaginitis which improved with treatment.  We will check for yeast and BV with wet prep today and treat as appropriate Referral to hand surgery to look at her wrist issue Possible connective tissue anomaly.  Rheumatology evaluation may be helpful but they may not be able to see patient without lab evidence of abnormality.  Jeani Hawking reports she has had a significantly high ANA in the past, will check this for her today.  I also placed a referral to genetics in case they can help  Signed Lamar Blinks, MD

## 2022-01-20 NOTE — Patient Instructions (Incomplete)
Good to see you again today- I will be in touch with your swab results asap

## 2022-01-23 ENCOUNTER — Other Ambulatory Visit (HOSPITAL_COMMUNITY)
Admission: RE | Admit: 2022-01-23 | Discharge: 2022-01-23 | Disposition: A | Discharge status: Home / Self Care | Payer: 59 | Source: Ambulatory Visit | Attending: Family Medicine | Admitting: Family Medicine

## 2022-01-23 ENCOUNTER — Ambulatory Visit: Payer: 59 | Admitting: Family Medicine

## 2022-01-23 VITALS — BP 116/70 | HR 70 | Temp 97.8°F | Ht 66.0 in | Wt 190.0 lb

## 2022-01-23 DIAGNOSIS — Q798 Other congenital malformations of musculoskeletal system: Secondary | ICD-10-CM | POA: Diagnosis not present

## 2022-01-23 DIAGNOSIS — N898 Other specified noninflammatory disorders of vagina: Secondary | ICD-10-CM | POA: Diagnosis present

## 2022-01-23 DIAGNOSIS — M25532 Pain in left wrist: Secondary | ICD-10-CM

## 2022-01-23 MED ORDER — FLUCONAZOLE 150 MG PO TABS: 150.0000 mg | ORAL_TABLET | Freq: Once | ORAL | 0 refills | Status: AC

## 2022-01-25 ENCOUNTER — Encounter: Payer: Self-pay | Admitting: Family Medicine

## 2022-01-25 DIAGNOSIS — R768 Other specified abnormal immunological findings in serum: Secondary | ICD-10-CM

## 2022-01-25 LAB — ANTI-NUCLEAR AB-TITER (ANA TITER)
ANA TITER: 1:80 {titer} — ABNORMAL HIGH
ANA Titer 1: 1:80 {titer} — ABNORMAL HIGH

## 2022-01-25 LAB — CERVICOVAGINAL ANCILLARY ONLY
Bacterial Vaginitis (gardnerella): NEGATIVE
Candida Glabrata: NEGATIVE
Candida Vaginitis: POSITIVE — AB
Comment: NEGATIVE
Comment: NEGATIVE
Comment: NEGATIVE

## 2022-01-25 LAB — ANA: Anti Nuclear Antibody (ANA): POSITIVE — AB

## 2022-01-28 NOTE — Addendum Note (Signed)
Addended by: Lamar Blinks C on: 01/28/2022 06:55 AM   Modules accepted: Orders

## 2022-01-30 ENCOUNTER — Telehealth: Payer: Self-pay | Admitting: Family Medicine

## 2022-01-30 ENCOUNTER — Ambulatory Visit (INDEPENDENT_AMBULATORY_CARE_PROVIDER_SITE_OTHER): Payer: 59 | Admitting: Family Medicine

## 2022-01-30 ENCOUNTER — Ambulatory Visit (HOSPITAL_BASED_OUTPATIENT_CLINIC_OR_DEPARTMENT_OTHER)
Admission: RE | Admit: 2022-01-30 | Discharge: 2022-01-30 | Disposition: A | Discharge status: Home / Self Care | Payer: 59 | Source: Ambulatory Visit | Attending: Family Medicine | Admitting: Family Medicine

## 2022-01-30 VITALS — BP 110/70 | HR 79 | Temp 97.9°F | Resp 18 | Ht 66.0 in | Wt 188.6 lb

## 2022-01-30 DIAGNOSIS — M79641 Pain in right hand: Secondary | ICD-10-CM | POA: Diagnosis present

## 2022-01-30 MED ORDER — DOXYCYCLINE HYCLATE 100 MG PO CAPS: 100.0000 mg | ORAL_CAPSULE | Freq: Two times a day (BID) | ORAL | 0 refills | Status: DC

## 2022-01-30 NOTE — Telephone Encounter (Signed)
Pt called stating that she needed to have the rheumatology referral resent to the follow fax number:  F: [phone]

## 2022-01-30 NOTE — Patient Instructions (Signed)
Please go to lab and then to the ground floor imaging dept for your x-ray Start on doxycycline Let me know how things go the next couple of days!

## 2022-01-30 NOTE — Progress Notes (Signed)
[place] at [facility] [address] [place], [place] [address] [phone]  Date:  01/30/2022   Name:  Victoria Hall   DOB:  March 25, 1971   MRN:  [id number]  PCP:  Darreld Mclean, MD    Chief Complaint: Hand Pain (Pain and swelling in the Right hand. X 1 day. )   History of Present Illness:  Victoria Hall is a 51 y.o. very pleasant female patient who presents with the following:  Pt notes pain and swelling of her right 3rd MCP yesterday - NKI It is painful, she is using ice Her hip is sore today but no other joint issues noted  No personal or family history of gout We have been pursuing a potential connective tissue disease diagnosis, ANA was elevated She otherwise feels at her normal baseline, no fever   Patient Active Problem List   Diagnosis Date Noted   Flushing 08/08/2019   CML (chronic myelocytic leukemia) ([place]) 06/03/2018   Hemochromatosis 04/18/2018   Hypermobility syndrome 09/26/2017   Mast cell activation syndrome ([place]) 09/26/2017   POTS (postural orthostatic tachycardia syndrome) 08/10/2017   Incomplete right bundle branch block 07/11/2017   Fatigue 07/11/2017   Low back pain 07/21/2016   Right knee pain 06/05/2016   Left shoulder pain 06/05/2016   Hydrosalpinx 02/08/2016   Hemorrhagic ovarian cyst 02/08/2016   Right hip pain 02/03/2016   HYDROSALPINX 02/07/2008   HEADACHE 10/28/2007   TACHYCARDIA 10/14/2007   ANXIETY DISORDER 05/28/2007   POLYARTHRITIS 05/09/2007   UNSPECIFIED DISORDERS OF NERVOUS SYSTEM 04/22/2007   ABDOMINAL PAIN RIGHT LOWER QUADRANT 04/08/2007   PALPITATIONS, RECURRENT 11/07/2006   PROBLEMS W/SMELL/TASTE 06/29/2006   TOBACCO ABUSE 01/08/2006   VISUAL IMPAIRMENT 01/08/2006   DISTURBANCE, VISUAL NOS 01/08/2006   FIBROCYSTIC BREAST DISEASE 01/08/2006   [place] DISEASE, CERVICAL 01/08/2006   VERTIGO 01/08/2006    Past Medical History:  Diagnosis Date    Anxiety    [facility]- [facility] Low BP    Arthritis    Asthma    early teens only    Cervical intraepithelial neoplasia grade 3 2002   s/p LEEP Rx repeat pap negative   Cervical radiculopathy    CML (chronic myelocytic leukemia) ([place]) 06/03/2018   Fallopian tube disorder    right fallopian tube removed   Gallstones 09/2015   On CT   Headache    Migraines   Hemorrhagic ovarian cyst 02/08/2016   Hydrosalpinx    [facility]   Hydrosalpinx 02/08/2016   Interstitial cystitis    Neurological abnormality    Neg workup with MRI/MRA in 2007 WNL except decreased caliber MCA proximally, distal cavernous portion of left LCA which may represent true stenosis or technique on exam. Evaluated by Dr Linus Salmons.   Palpitations    Partial seizures (HCC)    questionable diag   Pneumonia    walking pneumonia   Polycystic ovary    multiple ovarian cysts removed   S/P cervical discectomy    Dr Vertell Limber, anterior discectomy C5-C7   S/P epidural steroid injection    Right hip   Seizures ([place]) 2005   simple partial seizure disorder-was mast cell problems per pt 09-15-2019- no seizures per pt     Past Surgical History:  Procedure Laterality Date   APPENDECTOMY  1986   BREAST BIOPSY Right 09/2019   BUNIONECTOMY Left    bunion removal   CERVICAL DISC SURGERY  2005   C5-C7   COLONOSCOPY     core breast biopsy      DENTAL SURGERY     fallopian tue removed     LAPAROSCOPIC BILATERAL SALPINGECTOMY Left 02/09/2016   Procedure: LAPAROSCOPIC LEFT  SALPINGECTOMY;  Surgeon: Janyth Contes, MD;  Location: [facility];  Service: Gynecology;  Laterality: Left;   LAPAROSCOPIC LYSIS OF ADHESIONS  02/09/2016   Procedure: LAPAROSCOPIC LYSIS OF ADHESIONS;  Surgeon: Janyth Contes, MD;  Location: [facility];  Service: Gynecology;;  momentum to adenexa   LAPAROSCOPIC OVARIAN CYSTECTOMY Right 02/09/2016   Procedure: LAPAROSCOPIC OVARIAN CYSTECTOMY;  Surgeon: Janyth Contes, MD;   Location: [facility];  Service: Gynecology;  Laterality: Right;  x2 to right ovary   LEEP  1998   OOPHORECTOMY Left 02/09/2016   Procedure: LEFT OOPHORECTOMY;  Surgeon: Janyth Contes, MD;  Location: [facility];  Service: Gynecology;  Laterality: Left;- pt states left ovary has NOT been removed    POLYPECTOMY     TONSILLECTOMY  2001   UPPER GASTROINTESTINAL ENDOSCOPY     UPPER GI ENDOSCOPY      Social History   Tobacco Use   Smoking status: Former    Packs/day: 0.00    Years: 0.00    Total pack years: 0.00    Types: Cigarettes    Quit date: 11/02/2015    Years since quitting: 6.2   Smokeless tobacco: Never   Tobacco comments:    currently smoking, began at beginning of June and plans to quit on June 14th. Has smoked off and on in the past.  Vaping Use   Vaping Use: Every day  Substance Use Topics   Alcohol use: Yes    Alcohol/week: 1.0 - 2.0 standard drink of alcohol    Types: 1 - 2 Standard drinks or equivalent per week    Comment: occasionally 0-2 per day- rare    Drug use: No    Family History  Adopted: Yes  Problem Relation Age of Onset   COPD Mother    Breast cancer Mother    Other Mother        Teeth problems- all removed by age 50   Rashes / Skin problems Sister    Other Sister        Teeth problems- all removed by age 72   Rashes / Skin problems Brother    Asthma Brother    Other Brother        Teeth problems- all removed by age 72   Breast cancer Maternal Aunt    Adrenal disorder Neg Hx    Colon cancer Neg Hx    Colon polyps Neg Hx    Esophageal cancer Neg Hx    Stomach cancer Neg Hx    Rectal cancer Neg Hx     Allergies  Allergen Reactions   Clindamycin/Lincomycin Itching and Other (See Comments)    Dizziness, trouble swallowing   Codeine Itching and Nausea And Vomiting   Cyclobenzaprine Other (See Comments)    Fast heartbeat, restless leg, nightmares   Gabapentin Other (See Comments)    Headaches, visual disturbances.    Nyquil Multi-Symptom  [Pseudoeph-Doxylamine-Dm-Apap] Other (See Comments)    Heart racing, dizziness, weakness   Uribel [Meth-Hyo-M Bl-Na Phos-Ph Sal] Other (See Comments)    Heart racing, dizziness, blurry vision.   Xanax [Alprazolam]     Bruising   Urelle Nausea And Vomiting    Medication list has been reviewed and updated.  Current Outpatient Medications on File Prior to Visit  Medication Sig Dispense Refill   Ascorbic Acid (VITAMIN C) 100 MG tablet Take 100 mg by mouth daily.     clonazePAM (KLONOPIN) 0.5 MG tablet Take 1 tablet (0.5 mg total) by mouth 2 (two) times daily as needed for anxiety. 20 tablet 1   Copper Gluconate (COPPER CAPS) 2 MG CAPS Take 2 mg by mouth as directed. 80 capsule 0   famotidine (PEPCID) 20 MG tablet Take 10 mg by mouth daily.     GLEEVEC 100 MG tablet TAKE 2 TABLETS BY MOUTH ONCE DAILY AT THE SAME TIME WITH FOOD AND A LARGE GLASS OF WATER. AVOID GRAPEFRUIT PRODUCTS. 60 tablet 1   loratadine (CLARITIN) 10 MG tablet Take 10 mg by mouth daily.     magnesium oxide (MAG-OX) 400 (240 Mg) MG tablet TAKE 1 TABLET BY MOUTH EVERY DAY 90 tablet 1   Oyster Shell Calcium 500 MG TABS Please specify directions, refills and quantity 30 tablet 2   No current facility-administered medications on file prior to visit.    Review of Systems:   Phhysical Examination: Vitals:   01/30/22 1532  BP: 110/70  Pulse: 79  Resp: 18  Temp: 97.9 F (36.6 C)  SpO2: 95%   Vitals:   01/30/22 1532  Weight: 188 lb 9.6 oz (85.5 kg)  Height: '5\' 6"'$  (1.676 m)   Body mass index is 30.44 kg/m. Ideal Body Weight: Weight in (lb) to have BMI = 25: 154.6  GEN: no acute distress.  Mildly obese, looks well HEENT: Atraumatic, Normocephalic.  Ears and Nose: No external deformity. CV: RRR, No M/G/R. No JVD. No thrill. No extra heart sounds. PULM: CTA B, no wheezes, crackles, rhonchi. No retractions. No resp. distress. No accessory muscle use. ABD: S, NT, ND, +BS. No rebound. No HSM. EXTR: No c/c/e PSYCH:  Normally interactive. Conversant.  Mild swelling and erythema of the right third MCP.  It is not acutely tender to suggest gout.  She has some discomfort with completely closing her fist but otherwise nontender to flexion and extension, normal strength     Assessment and Plan: Right hand pain - Plan: DG Hand Complete Right, Uric acid, doxycycline (VIBRAMYCIN) 100 MG capsule, Sedimentation rate  Patient seen today with swelling and tenderness of the right third MCP joint.  Gout is possible, will obtain a uric acid.  Also check sed rate and a plain film of the hand Cellulitis is possible, will start her on doxycycline for the time being I have asked her to let me know if not feeling better in the next few days, sooner if worse  Signed Lamar Blinks, MD

## 2022-01-31 ENCOUNTER — Encounter: Payer: Self-pay | Admitting: Family Medicine

## 2022-01-31 LAB — SEDIMENTATION RATE: Sed Rate: 10 mm/hr (ref 0–30)

## 2022-01-31 LAB — URIC ACID: Uric Acid, Serum: 5.1 mg/dL (ref 2.4–7.0)

## 2022-01-31 NOTE — Telephone Encounter (Signed)
Patient said she called to schedule appt with Dr. Gerilyn Nestle but they have not received referral. Please refax to [phone].

## 2022-02-02 ENCOUNTER — Encounter: Payer: Self-pay | Admitting: Family Medicine

## 2022-02-03 ENCOUNTER — Inpatient Hospital Stay: Payer: 59 | Attending: Family

## 2022-02-03 ENCOUNTER — Other Ambulatory Visit: Payer: Self-pay

## 2022-02-03 DIAGNOSIS — C9210 Chronic myeloid leukemia, BCR/ABL-positive, not having achieved remission: Secondary | ICD-10-CM | POA: Diagnosis not present

## 2022-02-03 LAB — CMP (CANCER CENTER ONLY)
ALT: 23 U/L (ref 0–44)
AST: 21 U/L (ref 15–41)
Albumin: 4.1 g/dL (ref 3.5–5.0)
Alkaline Phosphatase: 78 U/L (ref 38–126)
Anion gap: 4 — ABNORMAL LOW (ref 5–15)
BUN: 11 mg/dL (ref 6–20)
CO2: 31 mmol/L (ref 22–32)
Calcium: 9.4 mg/dL (ref 8.9–10.3)
Chloride: 106 mmol/L (ref 98–111)
Creatinine: 0.96 mg/dL (ref 0.44–1.00)
GFR, Estimated: >60 mL/min (ref >60)
Glucose, Bld: 101 mg/dL — ABNORMAL HIGH (ref 70–99)
Potassium: 4.1 mmol/L (ref 3.5–5.1)
Sodium: 141 mmol/L (ref 135–145)
Total Bilirubin: 0.4 mg/dL (ref 0.3–1.2)
Total Protein: 6.8 g/dL (ref 6.5–8.1)

## 2022-02-03 LAB — CBC WITH DIFFERENTIAL (CANCER CENTER ONLY)
Abs Immature Granulocytes: 0.02 10*3/uL (ref 0.00–0.07)
Basophils Absolute: 0.1 10*3/uL (ref 0.0–0.1)
Basophils Relative: 1 %
Eosinophils Absolute: 0.5 10*3/uL (ref 0.0–0.5)
Eosinophils Relative: 6 %
HCT: 39.1 % (ref 36.0–46.0)
Hemoglobin: 13.6 g/dL (ref 12.0–15.0)
Immature Granulocytes: 0 %
Lymphocytes Relative: 34 %
Lymphs Abs: 3.2 10*3/uL (ref 0.7–4.0)
MCH: 31.1 pg (ref 26.0–34.0)
MCHC: 34.8 g/dL (ref 30.0–36.0)
MCV: 89.3 fL (ref 80.0–100.0)
Monocytes Absolute: 0.5 10*3/uL (ref 0.1–1.0)
Monocytes Relative: 6 %
Neutro Abs: 5 10*3/uL (ref 1.7–7.7)
Neutrophils Relative %: 53 %
Platelet Count: 307 10*3/uL (ref 150–400)
RBC: 4.38 MIL/uL (ref 3.87–5.11)
RDW: 13.2 % (ref 11.5–15.5)
WBC Count: 9.3 10*3/uL (ref 4.0–10.5)
nRBC: 0 % (ref 0.0–0.2)

## 2022-02-07 LAB — BCR/ABL

## 2022-02-07 NOTE — Progress Notes (Unsigned)
[name] [address], [place], [place] [address] [phone]  Date:  02/08/2022   Name:  Victoria Hall   DOB:  January 17, 1971   MRN:  [id number]  PCP:  Darreld Mclean, MD    Chief Complaint: Vaginitis (Pt has tried Monostat- OTC x 7 days, no improvement. )   History of Present Illness:  Victoria Hall is a 51 y.o. very pleasant female patient who presents with the following:  Patient seen today for concern of possible recurrent yeast infection Patient with history of CML, POTS, mast cell activation syndrome, hypermobility, hemochromatosis, chronic fatigue Her oncologist is Dr. Irene Limbo I saw her most recently on 10/30 when she had pain and swelling of her right third MCP joint.  This seemed to resolve spontaneously-her sed rate and uric acid were normal Today patient notes her hand feels fine, she did notice a tiny scab near the knuckle.  We wonder if perhaps she had a mild infection due to a wound or splinter.  Earlier last month we saw her for concern of vaginal itching-a swab was positive for Candida vaginitis  She did a 7 day monistat - she finished her course within the last few days.  However, she still feels a bit irritated and wonders if the infection might persist.  She would like to repeat her swab today Patient Active Problem List   Diagnosis Date Noted   Flushing 08/08/2019   CML (chronic myelocytic leukemia) ([place]) 06/03/2018   Hemochromatosis 04/18/2018   Hypermobility syndrome 09/26/2017   Mast cell activation syndrome ([place]) 09/26/2017   POTS (postural orthostatic tachycardia syndrome) 08/10/2017   Incomplete right bundle branch block 07/11/2017   Fatigue 07/11/2017   Low back pain 07/21/2016   Right knee pain 06/05/2016   Left shoulder pain 06/05/2016   Hydrosalpinx 02/08/2016   Hemorrhagic ovarian cyst 02/08/2016   Right hip pain 02/03/2016   HYDROSALPINX 02/07/2008   HEADACHE  10/28/2007   TACHYCARDIA 10/14/2007   ANXIETY DISORDER 05/28/2007   POLYARTHRITIS 05/09/2007   UNSPECIFIED DISORDERS OF NERVOUS SYSTEM 04/22/2007   ABDOMINAL PAIN RIGHT LOWER QUADRANT 04/08/2007   PALPITATIONS, RECURRENT 11/07/2006   PROBLEMS W/SMELL/TASTE 06/29/2006   TOBACCO ABUSE 01/08/2006   VISUAL IMPAIRMENT 01/08/2006   DISTURBANCE, VISUAL NOS 01/08/2006   FIBROCYSTIC BREAST DISEASE 01/08/2006   [place] DISEASE, CERVICAL 01/08/2006   VERTIGO 01/08/2006    Past Medical History:  Diagnosis Date   Anxiety    [facility]- [facility] Low BP    Arthritis    Asthma    early teens only    Cervical intraepithelial neoplasia grade 3 2002   s/p LEEP Rx repeat pap negative   Cervical radiculopathy    CML (chronic myelocytic leukemia) ([place]) 06/03/2018   Fallopian tube disorder    right fallopian tube removed   Gallstones 09/2015   On CT   Headache    Migraines   Hemorrhagic ovarian cyst 02/08/2016   Hydrosalpinx    [facility]   Hydrosalpinx 02/08/2016   Interstitial cystitis    Neurological abnormality    Neg workup with MRI/MRA in 2007 WNL except decreased caliber MCA proximally, distal cavernous portion of left LCA which may represent true stenosis or technique on exam. Evaluated by Dr Linus Salmons.   Palpitations    Partial seizures (HCC)    questionable diag   Pneumonia    walking pneumonia   Polycystic ovary  multiple ovarian cysts removed   S/P cervical discectomy    Dr Vertell Limber, anterior discectomy C5-C7   S/P epidural steroid injection    Right hip   Seizures ([place]) 2005   simple partial seizure disorder-was mast cell problems per pt 09-15-2019- no seizures per pt     Past Surgical History:  Procedure Laterality Date   APPENDECTOMY  1986   BREAST BIOPSY Right 09/2019   BUNIONECTOMY Left    bunion removal   CERVICAL DISC SURGERY  2005   C5-C7   COLONOSCOPY     core breast biopsy      DENTAL SURGERY     fallopian tue removed      LAPAROSCOPIC BILATERAL SALPINGECTOMY Left 02/09/2016   Procedure: LAPAROSCOPIC LEFT  SALPINGECTOMY;  Surgeon: Janyth Contes, MD;  Location: [facility];  Service: Gynecology;  Laterality: Left;   LAPAROSCOPIC LYSIS OF ADHESIONS  02/09/2016   Procedure: LAPAROSCOPIC LYSIS OF ADHESIONS;  Surgeon: Janyth Contes, MD;  Location: [facility];  Service: Gynecology;;  momentum to adenexa   LAPAROSCOPIC OVARIAN CYSTECTOMY Right 02/09/2016   Procedure: LAPAROSCOPIC OVARIAN CYSTECTOMY;  Surgeon: Janyth Contes, MD;  Location: [facility];  Service: Gynecology;  Laterality: Right;  x2 to right ovary   LEEP  1998   OOPHORECTOMY Left 02/09/2016   Procedure: LEFT OOPHORECTOMY;  Surgeon: Janyth Contes, MD;  Location: [facility];  Service: Gynecology;  Laterality: Left;- pt states left ovary has NOT been removed    POLYPECTOMY     TONSILLECTOMY  2001   UPPER GASTROINTESTINAL ENDOSCOPY     UPPER GI ENDOSCOPY      Social History   Tobacco Use   Smoking status: Former    Packs/day: 0.00    Years: 0.00    Total pack years: 0.00    Types: Cigarettes    Quit date: 11/02/2015    Years since quitting: 6.2   Smokeless tobacco: Never   Tobacco comments:    currently smoking, began at beginning of June and plans to quit on June 14th. Has smoked off and on in the past.  Vaping Use   Vaping Use: Every day  Substance Use Topics   Alcohol use: Yes    Alcohol/week: 1.0 - 2.0 standard drink of alcohol    Types: 1 - 2 Standard drinks or equivalent per week    Comment: occasionally 0-2 per day- rare    Drug use: No    Family History  Adopted: Yes  Problem Relation Age of Onset   COPD Mother    Breast cancer Mother    Other Mother        Teeth problems- all removed by age 30   Rashes / Skin problems Sister    Other Sister        Teeth problems- all removed by age 79   Rashes / Skin problems Brother    Asthma Brother    Other Brother        Teeth problems- all removed by age 5   Breast cancer  Maternal Aunt    Adrenal disorder Neg Hx    Colon cancer Neg Hx    Colon polyps Neg Hx    Esophageal cancer Neg Hx    Stomach cancer Neg Hx    Rectal cancer Neg Hx     Allergies  Allergen Reactions   Clindamycin/Lincomycin Itching and Other (See Comments)    Dizziness, trouble swallowing   Codeine Itching and Nausea And Vomiting   Cyclobenzaprine Other (See Comments)  Fast heartbeat, restless leg, nightmares   Gabapentin Other (See Comments)    Headaches, visual disturbances.    Nyquil Multi-Symptom [Pseudoeph-Doxylamine-Dm-Apap] Other (See Comments)    Heart racing, dizziness, weakness   Uribel [Meth-Hyo-M Bl-Na Phos-Ph Sal] Other (See Comments)    Heart racing, dizziness, blurry vision.   Xanax [Alprazolam]     Bruising   Urelle Nausea And Vomiting    Medication list has been reviewed and updated.  Current Outpatient Medications on File Prior to Visit  Medication Sig Dispense Refill   Ascorbic Acid (VITAMIN C) 100 MG tablet Take 100 mg by mouth daily.     clonazePAM (KLONOPIN) 0.5 MG tablet Take 1 tablet (0.5 mg total) by mouth 2 (two) times daily as needed for anxiety. 20 tablet 1   Copper Gluconate (COPPER CAPS) 2 MG CAPS Take 2 mg by mouth as directed. 80 capsule 0   famotidine (PEPCID) 20 MG tablet Take 10 mg by mouth daily.     GLEEVEC 100 MG tablet TAKE 2 TABLETS BY MOUTH ONCE DAILY AT THE SAME TIME WITH FOOD AND A LARGE GLASS OF WATER. AVOID GRAPEFRUIT PRODUCTS. 60 tablet 1   loratadine (CLARITIN) 10 MG tablet Take 10 mg by mouth daily.     magnesium oxide (MAG-OX) 400 (240 Mg) MG tablet TAKE 1 TABLET BY MOUTH EVERY DAY 90 tablet 1   Oyster Shell Calcium 500 MG TABS Please specify directions, refills and quantity 30 tablet 2   No current facility-administered medications on file prior to visit.    Review of Systems:  As per HPI- otherwise negative.   Physical Examination: Vitals:   02/08/22 1524  BP: 118/70  Pulse: 89  Resp: 18  Temp: 97.8 F (36.6 C)   SpO2: 97%   Vitals:   02/08/22 1524  Weight: 189 lb (85.7 kg)  Height: '5\' 6"'$  (1.676 m)   Body mass index is 30.51 kg/m. Ideal Body Weight: Weight in (lb) to have BMI = 25: 154.6  GEN: no acute distress.  Mildly obese, looks well HEENT: Atraumatic, Normocephalic.  Ears and Nose: No external deformity. CV: RRR, No M/G/R. No JVD. No thrill. No extra heart sounds. PULM: CTA B, no wheezes, crackles, rhonchi. No retractions. No resp. distress. No accessory muscle use. ABD: S, NT, ND EXTR: No c/c/e PSYCH: Normally interactive. Conversant.  Vaginal tissues appear normal, no apparent inflammation, no redness or discharge.  Collected wet prep swab  Assessment and Plan: Yeast vaginitis - Plan: fluconazole (DIFLUCAN) 150 MG tablet, Cervicovaginal ancillary only( [place])  Patient seen today with concern of possible persistent yeast vaginitis.  Wet prep swab is pending as above, prescribed Diflucan to use if needed.  I will be in touch with her results  Signed Lamar Blinks, MD

## 2022-02-08 ENCOUNTER — Other Ambulatory Visit (HOSPITAL_COMMUNITY)
Admission: RE | Admit: 2022-02-08 | Discharge: 2022-02-08 | Disposition: A | Discharge status: Home / Self Care | Payer: 59 | Source: Ambulatory Visit | Attending: Family Medicine | Admitting: Family Medicine

## 2022-02-08 ENCOUNTER — Ambulatory Visit (INDEPENDENT_AMBULATORY_CARE_PROVIDER_SITE_OTHER): Payer: 59 | Admitting: Family Medicine

## 2022-02-08 VITALS — BP 118/70 | HR 89 | Temp 97.8°F | Resp 18 | Ht 66.0 in | Wt 189.0 lb

## 2022-02-08 DIAGNOSIS — B3731 Acute candidiasis of vulva and vagina: Secondary | ICD-10-CM | POA: Insufficient documentation

## 2022-02-08 MED ORDER — FLUCONAZOLE 150 MG PO TABS: 150.0000 mg | ORAL_TABLET | Freq: Once | ORAL | 0 refills | Status: AC

## 2022-02-08 NOTE — Patient Instructions (Signed)
I will be in touch asap!

## 2022-02-10 ENCOUNTER — Encounter: Payer: Self-pay | Admitting: Family Medicine

## 2022-02-10 LAB — CERVICOVAGINAL ANCILLARY ONLY
Bacterial Vaginitis (gardnerella): NEGATIVE
Candida Glabrata: NEGATIVE
Candida Vaginitis: NEGATIVE
Comment: NEGATIVE
Comment: NEGATIVE
Comment: NEGATIVE

## 2022-02-17 ENCOUNTER — Inpatient Hospital Stay (HOSPITAL_BASED_OUTPATIENT_CLINIC_OR_DEPARTMENT_OTHER): Payer: 59 | Admitting: Hematology

## 2022-02-17 DIAGNOSIS — C9210 Chronic myeloid leukemia, BCR/ABL-positive, not having achieved remission: Secondary | ICD-10-CM

## 2022-02-17 NOTE — Progress Notes (Addendum)
HEMATOLOGY/ONCOLOGY PHONE VISIT NOTE  Date of Service: .11/11/2021   Patient Care Team: Copland, Gay Filler, MD as PCP - General (Family Medicine)  CHIEF COMPLAINTS/PURPOSE OF CONSULTATION:  For continued evaluation and management of CML  HISTORY OF PRESENTING ILLNESS:  Please see previous note for details of initial presentation.  Interval History:   Marland KitchenMarland KitchenI connected with Arne Cleveland on 02/17/2022 at  3:30 PM EST by telephone visit and verified that I am speaking with the correct person using two identifiers.   I discussed the limitations, risks, security and privacy concerns of performing an evaluation and management service by telemedicine and the availability of in-person appointments. I also discussed with the patient that there may be a patient responsible charge related to this service. The patient expressed understanding and agreed to proceed.   Other persons participating in the visit and their role in the encounter: none   Patient's location: home  Provider's location: [facility]   Chief Complaint: Follow-up for CML and review of labs.  Patient notes no acute change in her symptoms or overall condition from a CML standpoint. Labs done 2 weeks ago were reviewed with her in detail and show undetectable BCR-ABL PCR. She reports having a-Vaginal yeast infection requiring treatment and is now resolved. -wrist left -- torn ligament, ganglion cyst -swelling rt wrist -- Dr Lorelei Pont -- ANA +ve --- rheumatology referral.  Has been given to her. Patient notes improvement in her intermittent rash. No shortness of breath or chest pain.  No abdominal pain or distention.. No weight loss.  Reasonable appetite.  Weight has been stable since her last clinic visit.   MEDICAL HISTORY:  Past Medical History:  Diagnosis Date   Anxiety    [facility]- [facility] due to Low BP    Arthritis    Asthma    early teens only    Cervical intraepithelial neoplasia grade 3 2002    s/p LEEP Rx repeat pap negative   Cervical radiculopathy    CML (chronic myelocytic leukemia) ([place]) 06/03/2018   Fallopian tube disorder    right fallopian tube removed   Gallstones 09/2015   On CT   Headache    Migraines   Hemorrhagic ovarian cyst 02/08/2016   Hydrosalpinx    [facility]   Hydrosalpinx 02/08/2016   Interstitial cystitis    Neurological abnormality    Neg workup with MRI/MRA in 2007 WNL except decreased caliber MCA proximally, distal cavernous portion of left LCA which may represent true stenosis or technique on exam. Evaluated by Dr Linus Salmons.   Palpitations    Partial seizures (HCC)    questionable diag   Pneumonia    walking pneumonia   Polycystic ovary    multiple ovarian cysts removed   S/P cervical discectomy    Dr Vertell Limber, anterior discectomy C5-C7   S/P epidural steroid injection    Right hip   Seizures ([place]) 2005   simple partial seizure disorder-was mast cell problems per pt 09-15-2019- no seizures per pt     SURGICAL HISTORY: Past Surgical History:  Procedure Laterality Date   APPENDECTOMY  1986   BREAST BIOPSY Right 09/2019   BUNIONECTOMY Left    bunion removal   CERVICAL DISC SURGERY  2005   C5-C7   COLONOSCOPY     core breast biopsy      DENTAL SURGERY     fallopian tue removed     LAPAROSCOPIC BILATERAL SALPINGECTOMY Left 02/09/2016   Procedure: LAPAROSCOPIC LEFT  SALPINGECTOMY;  Surgeon: Janyth Contes, MD;  Location: [facility];  Service: Gynecology;  Laterality: Left;   LAPAROSCOPIC LYSIS OF ADHESIONS  02/09/2016   Procedure: LAPAROSCOPIC LYSIS OF ADHESIONS;  Surgeon: Janyth Contes, MD;  Location: [facility];  Service: Gynecology;;  momentum to adenexa   LAPAROSCOPIC OVARIAN CYSTECTOMY Right 02/09/2016   Procedure: LAPAROSCOPIC OVARIAN CYSTECTOMY;  Surgeon: Janyth Contes, MD;  Location: [facility];  Service: Gynecology;  Laterality: Right;  x2 to right ovary   LEEP  1998   OOPHORECTOMY Left 02/09/2016   Procedure: LEFT  OOPHORECTOMY;  Surgeon: Janyth Contes, MD;  Location: [facility];  Service: Gynecology;  Laterality: Left;- pt states left ovary has NOT been removed    POLYPECTOMY     TONSILLECTOMY  2001   UPPER GASTROINTESTINAL ENDOSCOPY     UPPER GI ENDOSCOPY      SOCIAL HISTORY: Social History   Socioeconomic History   Marital status: Married    Spouse name: Not on file   Number of children: 0   Years of education: Not on file   Highest education level: Not on file  Occupational History   Occupation: novelist    Comment: has been accepted into [employer]..   Occupation: Pharmacist, hospital   Occupation: Curator  Tobacco Use   Smoking status: Former    Packs/day: 0.00    Years: 0.00    Total pack years: 0.00    Types: Cigarettes    Quit date: 11/02/2015    Years since quitting: 6.0   Smokeless tobacco: Never   Tobacco comments:    currently smoking, began at beginning of June and plans to quit on June 14th. Has smoked off and on in the past.  Vaping Use   Vaping Use: Every day  Substance and Sexual Activity   Alcohol use: Yes    Alcohol/week: 1.0 - 2.0 standard drink of alcohol    Types: 1 - 2 Standard drinks or equivalent per week    Comment: occasionally 0-2 per day- rare    Drug use: No   Sexual activity: Yes  Other Topics Concern   Not on file  Social History Narrative   Not on file   Social Determinants of Health   Financial Resource Strain: Not on file  Food Insecurity: Not on file  Transportation Needs: Not on file  Physical Activity: Not on file  Stress: Not on file  Social Connections: Not on file  Intimate Partner Violence: Not on file    FAMILY HISTORY: Family History  Adopted: Yes  Problem Relation Age of Onset   COPD Mother    Breast cancer Mother    Other Mother        Teeth problems- all removed by age 28   Rashes / Skin problems Sister    Other Sister        Teeth problems- all removed by age 100   Rashes / Skin problems Brother     Asthma Brother    Other Brother        Teeth problems- all removed by age 14   Breast cancer Maternal Aunt    Adrenal disorder Neg Hx    Colon cancer Neg Hx    Colon polyps Neg Hx    Esophageal cancer Neg Hx    Stomach cancer Neg Hx    Rectal cancer Neg Hx     ALLERGIES:  is allergic to clindamycin/lincomycin, codeine, cyclobenzaprine, gabapentin, nyquil multi-symptom [pseudoeph-doxylamine-dm-apap], uribel [meth-hyo-m bl-na phos-ph sal], xanax [alprazolam],  and urelle.  MEDICATIONS:  Current Outpatient Medications  Medication Sig Dispense Refill   Ascorbic Acid (VITAMIN C) 100 MG tablet Take 100 mg by mouth daily.     clonazePAM (KLONOPIN) 0.5 MG tablet Take 1 tablet (0.5 mg total) by mouth 2 (two) times daily as needed for anxiety. 20 tablet 1   Copper Gluconate (COPPER CAPS) 2 MG CAPS Take 2 mg by mouth as directed. 80 capsule 0   estradiol (ESTRACE VAGINAL) 0.1 MG/GM vaginal cream Place 0.5 g vaginally at bedtime. Use 2-3x weekly as needed to maintain vaginal comfort 42.5 g 12   famotidine (PEPCID) 20 MG tablet Take 10 mg by mouth daily.     GLEEVEC 100 MG tablet TAKE 2 TABLETS BY MOUTH ONCE DAILY AT THE SAME TIME WITH FOOD AND A LARGE GLASS OF WATER. AVOID GRAPEFRUIT PRODUCTS. 60 tablet 1   loratadine (CLARITIN) 10 MG tablet Take 10 mg by mouth daily.     magnesium oxide (MAG-OX) 400 (240 Mg) MG tablet TAKE 1 TABLET BY MOUTH EVERY DAY 90 tablet 1   Oyster Shell Calcium 500 MG TABS Please specify directions, refills and quantity 30 tablet 2   No current facility-administered medications for this visit.    REVIEW OF SYSTEMS:   10 Point review of Systems was done is negative except as noted above.  PHYSICAL EXAMINATION: Telehealth Visit.   LABORATORY DATA:  I have reviewed the data as listed      Latest Ref Rng & Units 02/03/2022    2:30 PM 10/14/2021   12:07 PM 07/20/2021    3:59 PM  CBC  WBC 4.0 - 10.5 K/uL 9.3  8.0  8.4   Hemoglobin 12.0 - 15.0 g/dL 13.6  13.9  13.5    Hematocrit 36.0 - 46.0 % 39.1  38.7  39.6   Platelets 150 - 400 K/uL 307  336  323     CBC    Component Value Date/Time   WBC 8.0 10/14/2021 1207   WBC 8.4 07/20/2021 1559   RBC 4.58 10/14/2021 1207   HGB 13.9 10/14/2021 1207   HGB 15.1 03/26/2017 1101   HCT 38.7 10/14/2021 1207   HCT 44.0 03/26/2017 1101   PLT 336 10/14/2021 1207   PLT 330 03/26/2017 1101   MCV 84.5 10/14/2021 1207   MCV 84 03/26/2017 1101   MCH 30.3 10/14/2021 1207   MCHC 35.9 10/14/2021 1207   RDW 13.0 10/14/2021 1207   RDW 14.0 03/26/2017 1101   LYMPHSABS 3.3 10/14/2021 1207   LYMPHSABS 2.8 03/26/2017 1101   MONOABS 0.4 10/14/2021 1207   EOSABS 0.3 10/14/2021 1207   EOSABS 0.4 03/26/2017 1101   BASOSABS 0.1 10/14/2021 1207   BASOSABS 0.2 03/26/2017 1101     .    Latest Ref Rng & Units 02/03/2022    2:30 PM 10/14/2021   12:07 PM 07/07/2021    2:07 PM  CMP  Glucose 70 - 99 mg/dL 101  115  97   BUN 6 - 20 mg/dL '11  7  11   '$ Creatinine 0.44 - 1.00 mg/dL 0.96  1.05  0.98   Sodium 135 - 145 mmol/L 141  139  139   Potassium 3.5 - 5.1 mmol/L 4.1  3.9  4.1   Chloride 98 - 111 mmol/L 106  106  104   CO2 22 - 32 mmol/L '31  27  30   '$ Calcium 8.9 - 10.3 mg/dL 9.4  9.3  9.4   Total Protein 6.5 -  8.1 g/dL 6.8  6.8  7.0   Total Bilirubin 0.3 - 1.2 mg/dL 0.4  0.7  0.5   Alkaline Phos 38 - 126 U/L 78  75  79   AST 15 - 41 U/L '21  21  22   '$ ALT 0 - 44 U/L '23  27  27     '$ 05/16/18 Cytogenetics:   04/10/18 Hemochromatosis Panel:    RADIOGRAPHIC STUDIES: I have personally reviewed the radiological images as listed and agreed with the findings in the report. No results found.  01/31/18 ECHO Transthoracic:     ASSESSMENT & PLAN:   51 y.o. female with  1. CML- Chronic phase 05/16/18 BM Cytogenetics revealed translocation 9;22. FISH report revealed BCR-ABL gene rearrangement present in 86.5% of cells 06/03/18 FISH revealed BCR-ABL gene rearrangement present in 85.67% of cells, with some hypercellularity  around 80% 06/05/18 US Abdomen revealed normal spleen size 01/31/18 ECHO revealed LV EF of 63%, no regional wall motion abnormalities, no significant valvular hear disease  Initial pre-treatment 07/25/18 BCR-ABL revealed IS at 71.1362%  08/28/18 EKG reviewed  2. 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 Mild copper deficiency- replaced.  PLAN: Patient's labs from 02/03/2022 were reviewed in detail with her CBC within normal limits, CMP stable BCR-ABL PCR shows undetectable levels of BCR-ABL. Patient continues to have tolerable grade 1 muscle cramps. No significant new rashes Will continue Gleevec at 200 mg p.o. daily at this time. Continue current vitamin D replacement -She herself has stopped her Replacement admissions afterloader Continue to take B complex 1 capsule p.o. daily  FOLLOW UP: Return to clinic with Dr. Irene Limbo with labs in 3 months Please schedule labs 2 weeks prior to phone visit.   The total time spent in the appointment was 20 minutes*.  All of the patient's questions were answered with apparent satisfaction. The patient knows to call the clinic with any problems, questions or concerns.   Sullivan Lone MD MS AAHIVMS [facility] Hematology/Oncology Physician [facility]  .*Total Encounter Time as defined by the Centers for Medicare and Medicaid Services includes, in addition to the face-to-face time of a patient visit (documented in the note above) non-face-to-face time: obtaining and reviewing outside history, ordering and reviewing medications, tests or procedures, care coordination (communications with other health care professionals or caregivers) and documentation in the medical record.

## 2022-02-24 ENCOUNTER — Encounter: Payer: Self-pay | Admitting: Family

## 2022-02-28 ENCOUNTER — Telehealth: Payer: Self-pay | Admitting: Pharmacist

## 2022-02-28 NOTE — Telephone Encounter (Signed)
Oral Oncology Pharmacist Encounter   Received notification from [facility] that prior authorization renewal for [place] is required.   PA faxed to [place] at [phone] Key: [id number] Status is pending   [facility] will continue to follow.   Leron Croak, PharmD, BCPS, [facility] Hematology/Oncology Clinical Pharmacist [facility] and [place] [phone] 02/28/2022 9:32 AM

## 2022-03-02 NOTE — Telephone Encounter (Signed)
Oral Oncology Patient Advocate Encounter  Received notification that the request for prior authorization for Gleevec has been denied due to patient does not meet preferred drug requirements.  An urgent appeal for the prior authorization denial will be initiated by the pharmacist.   This encounter will continue to be updated until final appeal determination.       Lady Deutscher, CPhT-Adv Oncology Pharmacy Patient [place] Direct Number: [phone]  Fax: [phone]

## 2022-03-06 NOTE — Telephone Encounter (Signed)
Oral Oncology Pharmacist Encounter   Appeal letter sent to [facility] with supporting documentation for branded Gleevec for patient. Appeal faxed to: [phone]    Oral Oncology Clinic will continue to follow.    Leron Croak, PharmD, BCPS, BCOP Hematology/Oncology Clinical Pharmacist [facility] and [place] [phone] 03/06/2022 2:50 PM

## 2022-03-09 NOTE — Telephone Encounter (Signed)
Oral Oncology Pharmacist Encounter   Received phone call from Dr. Treasa School requesting peer to peer with Dr. Irene Limbo for branded [place] for patient.   Phone number for P2P is [phone]. Contact information shared with Dr. Irene Limbo.    Leron Croak, PharmD, BCPS, [facility] Hematology/Oncology Clinical Pharmacist [facility] and [place] [phone] 03/09/2022 10:17 AM

## 2022-03-10 NOTE — Telephone Encounter (Signed)
Oral Oncology Pharmacist Encounter  Received fax from [place] that 2nd level appeal for branded Gleevec was denied. Patient's insurance will only cover generic imatinib, Sprycel or Bosulif for patient.  Dr. Irene Limbo notified of the above.   Leron Croak, PharmD, BCPS, [facility] Hematology/Oncology Clinical Pharmacist [facility] and [place] [phone] 03/10/2022 12:50 PM

## 2022-03-13 ENCOUNTER — Other Ambulatory Visit: Payer: Self-pay | Admitting: Hematology

## 2022-03-13 DIAGNOSIS — C9210 Chronic myeloid leukemia, BCR/ABL-positive, not having achieved remission: Secondary | ICD-10-CM

## 2022-04-04 ENCOUNTER — Telehealth: Payer: Self-pay | Admitting: Pharmacy Technician

## 2022-04-04 ENCOUNTER — Other Ambulatory Visit (HOSPITAL_COMMUNITY): Payer: Self-pay

## 2022-04-04 NOTE — Telephone Encounter (Signed)
Oral Oncology Patient Advocate Encounter   Received notification that prior authorization for Imatinib is required.   PA submitted on 04/04/22 Key [id number] Status is pending     Lady Deutscher, CPhT-Adv Oncology Pharmacy Patient [place] Direct Number: [phone]  Fax: [phone]

## 2022-04-05 ENCOUNTER — Other Ambulatory Visit (HOSPITAL_COMMUNITY): Payer: Self-pay

## 2022-04-05 NOTE — Telephone Encounter (Signed)
Oral Oncology Patient Advocate Encounter  Prior Authorization for Imatinib has been approved.    PA# [id number] Effective dates: 04/05/22 through 04/06/23  Patient must fill at [facility].    Lady Deutscher, CPhT-Adv Oncology Pharmacy Patient [place] Direct Number: [phone]  Fax: [phone]

## 2022-04-11 ENCOUNTER — Other Ambulatory Visit: Payer: Self-pay

## 2022-04-11 ENCOUNTER — Other Ambulatory Visit: Payer: Self-pay | Admitting: Hematology

## 2022-04-11 DIAGNOSIS — C9210 Chronic myeloid leukemia, BCR/ABL-positive, not having achieved remission: Secondary | ICD-10-CM

## 2022-04-11 MED ORDER — IMATINIB MESYLATE 100 MG PO TABS: ORAL_TABLET | ORAL | 1 refills | Status: DC

## 2022-04-12 ENCOUNTER — Encounter: Payer: Self-pay | Admitting: Family Medicine

## 2022-04-12 DIAGNOSIS — R102 Pelvic and perineal pain: Secondary | ICD-10-CM

## 2022-04-12 NOTE — Telephone Encounter (Signed)
Doesn't look like she has been seen for this- are you okay with the referral?

## 2022-04-14 ENCOUNTER — Telehealth: Payer: Self-pay

## 2022-04-14 ENCOUNTER — Encounter: Payer: Self-pay | Admitting: Hematology

## 2022-04-14 NOTE — Telephone Encounter (Signed)
Discussed with pt and husband on 3 occasions the process of how Pharmacy tried to appeal insurance decision for branded Gleevec versus generic. The same process was used this year that was used last year when it was approved, but this year it was denied. Pharmacy has documented their process in this pt's chart. Pt and Husband state that insurance says we did not send in  the required information, however it was sent. Pt verbalized understanding but does not want to take generic. Pt states her quality of life was not good with generic. Pt had extreme GI sickness, pain, debilitating cramps, rashes, shortness of breath, and exhaustion with the generic Imatinib. If pt tries generic she will document s/s daily.

## 2022-04-17 ENCOUNTER — Other Ambulatory Visit (HOSPITAL_COMMUNITY): Payer: Self-pay

## 2022-04-24 ENCOUNTER — Telehealth: Payer: Self-pay

## 2022-04-24 NOTE — Telephone Encounter (Signed)
Contacted pt regarding MyChart message. Pt to come in to see Dr Irene Limbo in person with an earlier appointment. Will discuss drug options at that time.

## 2022-05-08 ENCOUNTER — Other Ambulatory Visit: Payer: Self-pay

## 2022-05-08 ENCOUNTER — Inpatient Hospital Stay: Payer: 59 | Attending: Family | Admitting: Physician Assistant

## 2022-05-08 ENCOUNTER — Inpatient Hospital Stay: Payer: 59

## 2022-05-08 ENCOUNTER — Telehealth: Payer: Self-pay

## 2022-05-08 VITALS — BP 125/72 | HR 95 | Temp 99.4°F | Resp 16 | Wt 189.8 lb

## 2022-05-08 DIAGNOSIS — Z148 Genetic carrier of other disease: Secondary | ICD-10-CM | POA: Diagnosis not present

## 2022-05-08 DIAGNOSIS — K122 Cellulitis and abscess of mouth: Secondary | ICD-10-CM | POA: Diagnosis present

## 2022-05-08 DIAGNOSIS — L0291 Cutaneous abscess, unspecified: Secondary | ICD-10-CM | POA: Diagnosis not present

## 2022-05-08 DIAGNOSIS — C9210 Chronic myeloid leukemia, BCR/ABL-positive, not having achieved remission: Secondary | ICD-10-CM

## 2022-05-08 DIAGNOSIS — E610 Copper deficiency: Secondary | ICD-10-CM | POA: Insufficient documentation

## 2022-05-08 DIAGNOSIS — K120 Recurrent oral aphthae: Secondary | ICD-10-CM | POA: Diagnosis not present

## 2022-05-08 LAB — CMP (CANCER CENTER ONLY)
ALT: 21 U/L (ref 0–44)
AST: 19 U/L (ref 15–41)
Albumin: 3.9 g/dL (ref 3.5–5.0)
Alkaline Phosphatase: 76 U/L (ref 38–126)
Anion gap: 4 — ABNORMAL LOW (ref 5–15)
BUN: 10 mg/dL (ref 6–20)
CO2: 31 mmol/L (ref 22–32)
Calcium: 9.3 mg/dL (ref 8.9–10.3)
Chloride: 105 mmol/L (ref 98–111)
Creatinine: 0.86 mg/dL (ref 0.44–1.00)
GFR, Estimated: >60 mL/min (ref >60)
Glucose, Bld: 99 mg/dL (ref 70–99)
Potassium: 4.2 mmol/L (ref 3.5–5.1)
Sodium: 140 mmol/L (ref 135–145)
Total Bilirubin: 0.3 mg/dL (ref 0.3–1.2)
Total Protein: 6.3 g/dL — ABNORMAL LOW (ref 6.5–8.1)

## 2022-05-08 LAB — CBC WITH DIFFERENTIAL (CANCER CENTER ONLY)
Abs Immature Granulocytes: 0.02 10*3/uL (ref 0.00–0.07)
Basophils Absolute: 0.1 10*3/uL (ref 0.0–0.1)
Basophils Relative: 1 %
Eosinophils Absolute: 0.5 10*3/uL (ref 0.0–0.5)
Eosinophils Relative: 5 %
HCT: 39.1 % (ref 36.0–46.0)
Hemoglobin: 13.5 g/dL (ref 12.0–15.0)
Immature Granulocytes: 0 %
Lymphocytes Relative: 31 %
Lymphs Abs: 2.8 10*3/uL (ref 0.7–4.0)
MCH: 30.5 pg (ref 26.0–34.0)
MCHC: 34.5 g/dL (ref 30.0–36.0)
MCV: 88.3 fL (ref 80.0–100.0)
Monocytes Absolute: 0.5 10*3/uL (ref 0.1–1.0)
Monocytes Relative: 6 %
Neutro Abs: 4.9 10*3/uL (ref 1.7–7.7)
Neutrophils Relative %: 57 %
Platelet Count: 285 10*3/uL (ref 150–400)
RBC: 4.43 MIL/uL (ref 3.87–5.11)
RDW: 13.2 % (ref 11.5–15.5)
WBC Count: 8.8 10*3/uL (ref 4.0–10.5)
nRBC: 0 % (ref 0.0–0.2)

## 2022-05-08 NOTE — Telephone Encounter (Signed)
Pt to come in today to be seen in symptom management.

## 2022-05-08 NOTE — Progress Notes (Signed)
Symptom Management Consult note [facility]    Patient Care Team: Copland, Gay Filler, MD as PCP - General (Family Medicine)    Name / MRN / DOB: Victoria Hall  [id number]  10-06-1970   Date of visit: 05/08/2022   Chief Complaint/Reason for visit: abscess, mouth sore   Current Therapy:PO Imatinib     ASSESSMENT & PLAN: Patient is a 52 y.o. female  with oncologic history of CML followed by Dr. Irene Limbo.  I have viewed most recent oncology note and lab work.    #CML -bcr/abl-PCR in process and will be followed by oncologist. -Patient having multiple significant side effects since switching to Imatinib from [place]. She had similar symptoms that caused for oncologist to change her to [facility] in 2022. -I discussed patient with oncologist who encourages patient to seek follow-up with Dr. Leretha Pol at [facility] if she wants to establish care at an academic center or keep her appointment scheduled with him next week to discuss other TKI options. Patient is agreeable to keep appointment here next week as scheduled on 2/14 for further discussion.    #Symptom management: mouth sore and abscess -Both resolved over the weekend with OTC symptom management. No I&D or antibiotics needed based on exam. No further work up needed at this time. -CBC and CMP today and are overall unremarkable. -Discussed symptom management should symptoms return.     Heme/Onc History: Oncology History   No history exists.      Interval history-: Victoria Hall is a 52 y.o. female with oncologic history of CML presenting to [facility] today with chief complaint of mouth sores and abscess.  Patient presents unaccompanied to clinic.  Patient reports three days after switching from [place] to Imatinib she has been experiencing multiple side effects. She states she was switched due to insurance reasons that were out of her control. She is reporting nausea, muscle spasms, headaches, diarrhea, and  feeling flushed. Symptoms are all intermittent and simialr to when she was Imatinib in 2020. She called oncologist Friday to report an ulcer in her mouth and an abscess in her groin and was scheduled to be evaluated at the next available [facility] visit which was today. She states she had an ulcer in her mouth and an abscess on on her groin for approximately one week. The mouth ulcer spontaneously ruptured and she denies there being any drainage or bleeding. The pain resolved once it ruptured. She is now tolerating PO intake without difficulty. She denies history of ulcers in the past.  She describes the abscess as located on the left side of her groin. It had fluctuance and she was applying warm compresses frequently until she started to have purulent drainage. She had aching pain localized to the abscess prior to the drainage. She has not had any fever, chills, or rash.     ROS  All other systems are reviewed and are negative for acute change except as noted in the HPI.    Allergies  Allergen Reactions   Clindamycin/Lincomycin Itching and Other (See Comments)    Dizziness, trouble swallowing   Codeine Itching and Nausea And Vomiting   Cyclobenzaprine Other (See Comments)    Fast heartbeat, restless leg, nightmares   Gabapentin Other (See Comments)    Headaches, visual disturbances.    Nyquil Multi-Symptom [Pseudoeph-Doxylamine-Dm-Apap] Other (See Comments)    Heart racing, dizziness, weakness   Uribel [Meth-Hyo-M Bl-Na Phos-Ph Sal] Other (See Comments)    Heart racing, dizziness, blurry vision.  Xanax [Alprazolam]     Bruising   Urelle Nausea And Vomiting     Past Medical History:  Diagnosis Date   Anxiety    [facility]- [facility] due [facility] Low BP    Arthritis    Asthma    early teens only    Cervical intraepithelial neoplasia grade 3 2002   s/p LEEP Rx repeat pap negative   Cervical radiculopathy    CML (chronic myelocytic leukemia) ([place]) 06/03/2018   Fallopian tube disorder     right fallopian tube removed   Gallstones 09/2015   On CT   Headache    Migraines   Hemorrhagic ovarian cyst 02/08/2016   Hydrosalpinx    [facility]   Hydrosalpinx 02/08/2016   Interstitial cystitis    Neurological abnormality    Neg workup with MRI/MRA in 2007 WNL except decreased caliber MCA proximally, distal cavernous portion of left LCA which may represent true stenosis or technique on exam. Evaluated by Dr Linus Salmons.   Palpitations    Partial seizures (HCC)    questionable diag   Pneumonia    walking pneumonia   Polycystic ovary    multiple ovarian cysts removed   S/P cervical discectomy    Dr Vertell Limber, anterior discectomy C5-C7   S/P epidural steroid injection    Right hip   Seizures ([place]) 2005   simple partial seizure disorder-was mast cell problems per pt 09-15-2019- no seizures per pt      Past Surgical History:  Procedure Laterality Date   APPENDECTOMY  1986   BREAST BIOPSY Right 09/2019   BUNIONECTOMY Left    bunion removal   CERVICAL DISC SURGERY  2005   C5-C7   COLONOSCOPY     core breast biopsy      DENTAL SURGERY     fallopian tue removed     LAPAROSCOPIC BILATERAL SALPINGECTOMY Left 02/09/2016   Procedure: LAPAROSCOPIC LEFT  SALPINGECTOMY;  Surgeon: Janyth Contes, MD;  Location: [facility];  Service: Gynecology;  Laterality: Left;   LAPAROSCOPIC LYSIS OF ADHESIONS  02/09/2016   Procedure: LAPAROSCOPIC LYSIS OF ADHESIONS;  Surgeon: Janyth Contes, MD;  Location: [facility];  Service: Gynecology;;  momentum to adenexa   LAPAROSCOPIC OVARIAN CYSTECTOMY Right 02/09/2016   Procedure: LAPAROSCOPIC OVARIAN CYSTECTOMY;  Surgeon: Janyth Contes, MD;  Location: [facility];  Service: Gynecology;  Laterality: Right;  x2 to right ovary   LEEP  1998   OOPHORECTOMY Left 02/09/2016   Procedure: LEFT OOPHORECTOMY;  Surgeon: Janyth Contes, MD;  Location: [facility];  Service: Gynecology;  Laterality: Left;- pt states left ovary has NOT been removed     POLYPECTOMY     TONSILLECTOMY  2001   UPPER GASTROINTESTINAL ENDOSCOPY     UPPER GI ENDOSCOPY      Social History   Socioeconomic History   Marital status: Married    Spouse name: Not on file   Number of children: 0   Years of education: Not on file   Highest education level: Not on file  Occupational History   Occupation: novelist    Comment: has been accepted into [employer] program..   Occupation: Pharmacist, hospital   Occupation: Curator  Tobacco Use   Smoking status: Former    Packs/day: 0.00    Years: 0.00    Total pack years: 0.00    Types: Cigarettes    Quit date: 11/02/2015    Years since quitting: 6.5   Smokeless tobacco: Never   Tobacco comments:  currently smoking, began at beginning of June and plans to quit on June 14th. Has smoked off and on in the past.  Vaping Use   Vaping Use: Every day  Substance and Sexual Activity   Alcohol use: Yes    Alcohol/week: 1.0 - 2.0 standard drink of alcohol    Types: 1 - 2 Standard drinks or equivalent per week    Comment: occasionally 0-2 per day- rare    Drug use: No   Sexual activity: Yes  Other Topics Concern   Not on file  Social History Narrative   Not on file   Social Determinants of Health   Financial Resource Strain: Not on file  Food Insecurity: Not on file  Transportation Needs: Not on file  Physical Activity: Not on file  Stress: Not on file  Social Connections: Not on file  Intimate Partner Violence: Not on file    Family History  Adopted: Yes  Problem Relation Age of Onset   COPD Mother    Breast cancer Mother    Other Mother        Teeth problems- all removed by age 89   Rashes / Skin problems Sister    Other Sister        Teeth problems- all removed by age 25   Rashes / Skin problems Brother    Asthma Brother    Other Brother        Teeth problems- all removed by age 36   Breast cancer Maternal Aunt    Adrenal disorder Neg Hx    Colon cancer Neg Hx    Colon polyps Neg Hx     Esophageal cancer Neg Hx    Stomach cancer Neg Hx    Rectal cancer Neg Hx      Current Outpatient Medications:    Ascorbic Acid (VITAMIN C) 100 MG tablet, Take 100 mg by mouth daily., Disp: , Rfl:    clonazePAM (KLONOPIN) 0.5 MG tablet, Take 1 tablet (0.5 mg total) by mouth 2 (two) times daily as needed for anxiety., Disp: 20 tablet, Rfl: 1   Copper Gluconate (COPPER CAPS) 2 MG CAPS, Take 2 mg by mouth as directed., Disp: 80 capsule, Rfl: 0   famotidine (PEPCID) 20 MG tablet, Take 10 mg by mouth daily., Disp: , Rfl:    imatinib (GLEEVEC) 100 MG tablet, Take 2 tablets once daily with meals and large glass of water.Caution:Chemotherapy, Disp: 60 tablet, Rfl: 1   loratadine (CLARITIN) 10 MG tablet, Take 10 mg by mouth daily., Disp: , Rfl:    magnesium oxide (MAG-OX) 400 (240 Mg) MG tablet, TAKE 1 TABLET BY MOUTH EVERY DAY, Disp: 90 tablet, Rfl: 1   Oyster Shell Calcium 500 MG TABS, Please specify directions, refills and quantity, Disp: 30 tablet, Rfl: 2  PHYSICAL EXAM: ECOG FS:1 - Symptomatic but completely ambulatory    Vitals:   05/08/22 1312  BP: 125/72  Pulse: 95  Resp: 16  Temp: 99.4 F (37.4 C)  TempSrc: Oral  SpO2: 100%  Weight: 189 lb 12.8 oz (86.1 kg)   Physical Exam Vitals and nursing note reviewed.  Constitutional:      Appearance: She is well-developed. She is not ill-appearing or toxic-appearing.  HENT:     Head: Normocephalic.     Nose: Nose normal.     Mouth/Throat:     Mouth: Mucous membranes are moist.     Pharynx: No oropharyngeal exudate.     Comments: Pin size aphthous ulcer. Non tender to  palpation. No signs of thrush or mucositis on exam.  Eyes:     Conjunctiva/sclera: Conjunctivae normal.  Neck:     Vascular: No JVD.  Cardiovascular:     Rate and Rhythm: Normal rate and regular rhythm.     Pulses: Normal pulses.     Heart sounds: Normal heart sounds.  Pulmonary:     Effort: Pulmonary effort is normal.     Breath sounds: Normal breath  sounds.  Abdominal:     General: There is no distension.  Genitourinary:   Musculoskeletal:     Cervical back: Normal range of motion.  Skin:    General: Skin is warm and dry.     Findings: No bruising or rash.  Neurological:     Mental Status: She is oriented to person, place, and time.        LABORATORY DATA: I have reviewed the data as listed    Latest Ref Rng & Units 05/08/2022    1:00 PM 02/03/2022    2:30 PM 10/14/2021   12:07 PM  CBC  WBC 4.0 - 10.5 K/uL 8.8  9.3  8.0   Hemoglobin 12.0 - 15.0 g/dL 13.5  13.6  13.9   Hematocrit 36.0 - 46.0 % 39.1  39.1  38.7   Platelets 150 - 400 K/uL 285  307  336         Latest Ref Rng & Units 05/08/2022    1:00 PM 02/03/2022    2:30 PM 10/14/2021   12:07 PM  CMP  Glucose 70 - 99 mg/dL 99  101  115   BUN 6 - 20 mg/dL '10  11  7   '$ Creatinine 0.44 - 1.00 mg/dL 0.86  0.96  1.05   Sodium 135 - 145 mmol/L 140  141  139   Potassium 3.5 - 5.1 mmol/L 4.2  4.1  3.9   Chloride 98 - 111 mmol/L 105  106  106   CO2 22 - 32 mmol/L '31  31  27   '$ Calcium 8.9 - 10.3 mg/dL 9.3  9.4  9.3   Total Protein 6.5 - 8.1 g/dL 6.3  6.8  6.8   Total Bilirubin 0.3 - 1.2 mg/dL 0.3  0.4  0.7   Alkaline Phos 38 - 126 U/L 76  78  75   AST 15 - 41 U/L '19  21  21   '$ ALT 0 - 44 U/L '21  23  27        '$ RADIOGRAPHIC STUDIES (from last 24 hours if applicable) I have personally reviewed the radiological images as listed and agreed with the findings in the report. No results found.      Visit Diagnosis: 1. CML (chronic myelocytic leukemia) ([place])   2. Ulcer aphthous oral   3. Abscess      No orders of the defined types were placed in this encounter.   All questions were answered. The patient knows to call the clinic with any problems, questions or concerns. No barriers to learning was detected.  I have spent a total of 20 minutes minutes of face-to-face and non-face-to-face time, preparing to see the patient, obtaining and/or reviewing separately obtained  history, performing a medically appropriate examination, counseling and educating the patient, ordering tests, documenting clinical information in the electronic health record, and care coordination (communications with other health care professionals or caregivers).    Thank you for allowing me to participate in the care of this patient.    Barrie Folk,  PA-C Department of Hematology/Oncology [place] [facility] Phone: [phone]  [phone]    05/08/2022 4:55 PM

## 2022-05-12 ENCOUNTER — Other Ambulatory Visit: Payer: 59

## 2022-05-12 LAB — BCR/ABL

## 2022-05-15 ENCOUNTER — Ambulatory Visit: Payer: 59 | Admitting: Family Medicine

## 2022-05-15 ENCOUNTER — Other Ambulatory Visit: Payer: Self-pay

## 2022-05-15 ENCOUNTER — Encounter: Payer: Self-pay | Admitting: Family Medicine

## 2022-05-15 VITALS — BP 96/66 | HR 98 | Temp 98.9°F | Ht 66.0 in | Wt 187.3 lb

## 2022-05-15 DIAGNOSIS — K644 Residual hemorrhoidal skin tags: Secondary | ICD-10-CM | POA: Diagnosis not present

## 2022-05-15 DIAGNOSIS — C9210 Chronic myeloid leukemia, BCR/ABL-positive, not having achieved remission: Secondary | ICD-10-CM

## 2022-05-15 NOTE — Progress Notes (Signed)
Established Patient Office Visit  Subjective   Patient ID: Victoria Hall, female    DOB: 03/07/1971  Age: 52 y.o. MRN: [id number]  Chief Complaint  Patient presents with   Hemorrhoids    Patient complains of hemorrhoids, x1 day    HPI   Seen as a work in with onset 2 days ago of "hemorrhoid ".  She had some painful swelling around her anal region.  She was concerned this may be related to her chemotherapy agent.  She had recent CBC and CMP which were stable.  She has not had any recent straining and in fact has had some diarrhea which she attributes to her chemo agent which she is taking for leukemia.  She tried some icing but no sitz bath's.  No prior history of reported hemorrhoids.  No bloody stools.  She had colonoscopy 6/21.  She has history of CML and apparently was changed recently to generic imatinib and is concerned she may be having some side effects from that medication.  Past Medical History:  Diagnosis Date   Anxiety    [facility] h[facility]- [facility] due to Low BP    Arthritis    Asthma    early teens only    Cervical intraepithelial neoplasia grade 3 2002   s/p LEEP Rx repeat pap negative   Cervical radiculopathy    CML (chronic myelocytic leukemia) ([place]) 06/03/2018   Fallopian tube disorder    right fallopian tube removed   Gallstones 09/2015   On CT   Headache    Migraines   Hemorrhagic ovarian cyst 02/08/2016   Hydrosalpinx    [facility]   Hydrosalpinx 02/08/2016   Interstitial cystitis    Neurological abnormality    Neg workup with MRI/MRA in 2007 WNL except decreased caliber MCA proximally, distal cavernous portion of left LCA which may represent true stenosis or technique on exam. Evaluated by Dr Linus Salmons.   Palpitations    Partial seizures (HCC)    questionable diag   Pneumonia    walking pneumonia   Polycystic ovary    multiple ovarian cysts removed   S/P cervical discectomy    Dr Vertell Limber, anterior discectomy C5-C7   S/P  epidural steroid injection    Right hip   Seizures ([place]) 2005   simple partial seizure disorder-was mast cell problems per pt 09-15-2019- no seizures per pt    Past Surgical History:  Procedure Laterality Date   APPENDECTOMY  1986   BREAST BIOPSY Right 09/2019   BUNIONECTOMY Left    bunion removal   CERVICAL DISC SURGERY  2005   C5-C7   COLONOSCOPY     core breast biopsy      DENTAL SURGERY     fallopian tue removed     LAPAROSCOPIC BILATERAL SALPINGECTOMY Left 02/09/2016   Procedure: LAPAROSCOPIC LEFT  SALPINGECTOMY;  Surgeon: Janyth Contes, MD;  Location: [facility];  Service: Gynecology;  Laterality: Left;   LAPAROSCOPIC LYSIS OF ADHESIONS  02/09/2016   Procedure: LAPAROSCOPIC LYSIS OF ADHESIONS;  Surgeon: Janyth Contes, MD;  Location: [facility];  Service: Gynecology;;  momentum to adenexa   LAPAROSCOPIC OVARIAN CYSTECTOMY Right 02/09/2016   Procedure: LAPAROSCOPIC OVARIAN CYSTECTOMY;  Surgeon: Janyth Contes, MD;  Location: [facility];  Service: Gynecology;  Laterality: Right;  x2 to right ovary   LEEP  1998   OOPHORECTOMY Left 02/09/2016   Procedure: LEFT OOPHORECTOMY;  Surgeon: Janyth Contes, MD;  Location: [facility];  Service: Gynecology;  Laterality: Left;-  pt states left ovary has NOT been removed    POLYPECTOMY     TONSILLECTOMY  2001   UPPER GASTROINTESTINAL ENDOSCOPY     UPPER GI ENDOSCOPY      reports that she quit smoking about 6 years ago. Her smoking use included cigarettes. She has never used smokeless tobacco. She reports current alcohol use of about 1.0 - 2.0 standard drink of alcohol per week. She reports that she does not use drugs. family history includes Asthma in her brother; Breast cancer in her maternal aunt and mother; COPD in her mother; Other in her brother, mother, and sister; Rashes / Skin problems in her brother and sister. She was adopted. Allergies  Allergen Reactions   Clindamycin/Lincomycin Itching and Other (See Comments)    Dizziness,  trouble swallowing   Codeine Itching and Nausea And Vomiting   Cyclobenzaprine Other (See Comments)    Fast heartbeat, restless leg, nightmares   Gabapentin Other (See Comments)    Headaches, visual disturbances.    Nyquil Multi-Symptom [Pseudoeph-Doxylamine-Dm-Apap] Other (See Comments)    Heart racing, dizziness, weakness   Uribel [Meth-Hyo-M Bl-Na Phos-Ph Sal] Other (See Comments)    Heart racing, dizziness, blurry vision.   Xanax [Alprazolam]     Bruising   Urelle Nausea And Vomiting    Review of Systems  Constitutional:  Negative for chills and fever.  Gastrointestinal:  Negative for abdominal pain, blood in stool, constipation and melena.      Objective:     BP 96/66 (BP Location: Left Arm, Patient Position: Sitting, Cuff Size: Normal)   Pulse 98   Temp 98.9 F (37.2 C) (Oral)   Ht 5' 6"$  (1.676 m)   Wt 187 lb 4.8 oz (85 kg)   SpO2 98%   BMI 30.23 kg/m  BP Readings from Last 3 Encounters:  05/15/22 96/66  05/08/22 125/72  02/08/22 118/70   Wt Readings from Last 3 Encounters:  05/15/22 187 lb 4.8 oz (85 kg)  05/08/22 189 lb 12.8 oz (86.1 kg)  02/08/22 189 lb (85.7 kg)      Physical Exam Vitals reviewed.  Constitutional:      Appearance: Normal appearance.  Cardiovascular:     Rate and Rhythm: Normal rate and regular rhythm.  Genitourinary:    Comments: Anal exam reveals fairly large external hemorrhoid around 2 o'clock position.  No acute thrombosis.  No visible anal fissure.  No active bleeding. Neurological:     Mental Status: She is alert.      No results found for any visits on 05/15/22.  Last CBC Lab Results  Component Value Date   WBC 8.8 05/08/2022   HGB 13.5 05/08/2022   HCT 39.1 05/08/2022   MCV 88.3 05/08/2022   MCH 30.5 05/08/2022   RDW 13.2 05/08/2022   PLT 285 [id number]   Last metabolic panel Lab Results  Component Value Date   GLUCOSE 99 05/08/2022   NA 140 05/08/2022   K 4.2 05/08/2022   CL 105 05/08/2022   CO2 31  05/08/2022   BUN 10 05/08/2022   CREATININE 0.86 05/08/2022   GFRNONAA >60 05/08/2022   CALCIUM 9.3 05/08/2022   PROT 6.3 (L) 05/08/2022   ALBUMIN 3.9 05/08/2022   LABGLOB 3.3 09/14/2016   AGRATIO 1.2 09/14/2016   BILITOT 0.3 05/08/2022   ALKPHOS 76 05/08/2022   AST 19 05/08/2022   ALT 21 05/08/2022   ANIONGAP 4 (L) 05/08/2022      The 10-year ASCVD risk score (Arnett DK, et al.,  2019) is: 1%    Assessment & Plan:   Symptomatic external hemorrhoid.  No acute thrombosis.  We recommend measures to reduce any straining.  She currently has no constipation and in fact has had some diarrhea.  We recommend she try sitz bath as once or twice daily for symptom relief and also try over-the-counter Anusol HC cream.   Carolann Littler, MD

## 2022-05-15 NOTE — Patient Instructions (Signed)
Avoid constipation (ie straining) with lots of fluids, fiber, as needed stool softeners  Consider warm sitz baths once or twice daily  Consider OTC topical such as Anusol HC cream.

## 2022-05-17 ENCOUNTER — Inpatient Hospital Stay: Payer: 59 | Admitting: Hematology

## 2022-05-17 ENCOUNTER — Other Ambulatory Visit: Payer: Self-pay

## 2022-05-17 ENCOUNTER — Inpatient Hospital Stay: Payer: 59

## 2022-05-17 VITALS — BP 124/83 | HR 90 | Temp 97.9°F | Resp 20 | Wt 187.0 lb

## 2022-05-17 DIAGNOSIS — R252 Cramp and spasm: Secondary | ICD-10-CM | POA: Diagnosis not present

## 2022-05-17 DIAGNOSIS — K120 Recurrent oral aphthae: Secondary | ICD-10-CM

## 2022-05-17 DIAGNOSIS — C9210 Chronic myeloid leukemia, BCR/ABL-positive, not having achieved remission: Secondary | ICD-10-CM

## 2022-05-17 LAB — CMP (CANCER CENTER ONLY)
ALT: 22 U/L (ref 0–44)
AST: 19 U/L (ref 15–41)
Albumin: 4.3 g/dL (ref 3.5–5.0)
Alkaline Phosphatase: 80 U/L (ref 38–126)
Anion gap: 3 — ABNORMAL LOW (ref 5–15)
BUN: 11 mg/dL (ref 6–20)
CO2: 32 mmol/L (ref 22–32)
Calcium: 9.7 mg/dL (ref 8.9–10.3)
Chloride: 105 mmol/L (ref 98–111)
Creatinine: 0.92 mg/dL (ref 0.44–1.00)
GFR, Estimated: >60 mL/min (ref >60)
Glucose, Bld: 92 mg/dL (ref 70–99)
Potassium: 4.3 mmol/L (ref 3.5–5.1)
Sodium: 140 mmol/L (ref 135–145)
Total Bilirubin: 0.4 mg/dL (ref 0.3–1.2)
Total Protein: 7 g/dL (ref 6.5–8.1)

## 2022-05-17 LAB — CBC WITH DIFFERENTIAL (CANCER CENTER ONLY)
Abs Immature Granulocytes: 0.01 10*3/uL (ref 0.00–0.07)
Basophils Absolute: 0.1 10*3/uL (ref 0.0–0.1)
Basophils Relative: 1 %
Eosinophils Absolute: 0.5 10*3/uL (ref 0.0–0.5)
Eosinophils Relative: 5 %
HCT: 40.5 % (ref 36.0–46.0)
Hemoglobin: 14.3 g/dL (ref 12.0–15.0)
Immature Granulocytes: 0 %
Lymphocytes Relative: 29 %
Lymphs Abs: 2.8 10*3/uL (ref 0.7–4.0)
MCH: 31 pg (ref 26.0–34.0)
MCHC: 35.3 g/dL (ref 30.0–36.0)
MCV: 87.7 fL (ref 80.0–100.0)
Monocytes Absolute: 0.7 10*3/uL (ref 0.1–1.0)
Monocytes Relative: 7 %
Neutro Abs: 5.6 10*3/uL (ref 1.7–7.7)
Neutrophils Relative %: 58 %
Platelet Count: 324 10*3/uL (ref 150–400)
RBC: 4.62 MIL/uL (ref 3.87–5.11)
RDW: 13.2 % (ref 11.5–15.5)
WBC Count: 9.7 10*3/uL (ref 4.0–10.5)
nRBC: 0 % (ref 0.0–0.2)

## 2022-05-17 NOTE — Progress Notes (Signed)
HEMATOLOGY/ONCOLOGY CLINIC VISIT NOTE  Date of Service: 05/17/22   Patient Care Team: Copland, Gay Filler, MD as PCP - General (Family Medicine)  CHIEF COMPLAINTS/PURPOSE OF CONSULTATION:  For continued evaluation and management of CML  HISTORY OF PRESENTING ILLNESS:  Please see previous note for details of initial presentation.  Interval History:    Victoria Hall 52 years old female who is here for continued evaluation and management of CML.  I had a phone visit with the patient on 02/17/2022 and she was doing well overall. Patient was last seen by PA Walisiewicz on 05/08/2022 and she complained of mouth sores and abscess. She was experiencing multiple side effects, including nausea, muscle spasm, headaches, diarrhea, after switching to Imatinib.   During today's visit, patient complains of worsening muscle spasm from Imatinib.   Patient reports family history of heart attacks. Patient is interested in getting an stress test and EKG.   Patient denies fever, chills, night sweats, fatigue, nausea, headache, abdominal pain, back pain, or leg swelling. She complains of Shortness of Breath, which she thinks is due to Imatinib. She notes that her skin rashes and diarrhea has improved since her last visit. She notes that her mouth sores have resolved.   She also notes of recurrent hernia problems.   She has received her influenza vaccine and COVID-19 Booster. She is also up to speed with all other age appropriate vaccines.     MEDICAL HISTORY:  Past Medical History:  Diagnosis Date   Anxiety    [facility]- [facility] Low BP    Arthritis    Asthma    early teens only    Cervical intraepithelial neoplasia grade 3 2002   s/p LEEP Rx repeat pap negative   Cervical radiculopathy    CML (chronic myelocytic leukemia) ([place]) 06/03/2018   Fallopian tube disorder    right fallopian tube removed   Gallstones 09/2015   On CT   Headache    Migraines   Hemorrhagic  ovarian cyst 02/08/2016   Hydrosalpinx    [facility]   Hydrosalpinx 02/08/2016   Interstitial cystitis    Neurological abnormality    Neg workup with MRI/MRA in 2007 WNL except decreased caliber MCA proximally, distal cavernous portion of left LCA which may represent true stenosis or technique on exam. Evaluated by Dr Linus Salmons.   Palpitations    Partial seizures (HCC)    questionable diag   Pneumonia    walking pneumonia   Polycystic ovary    multiple ovarian cysts removed   S/P cervical discectomy    Dr Vertell Limber, anterior discectomy C5-C7   S/P epidural steroid injection    Right hip   Seizures ([place]) 2005   simple partial seizure disorder-was mast cell problems per pt 09-15-2019- no seizures per pt     SURGICAL HISTORY: Past Surgical History:  Procedure Laterality Date   APPENDECTOMY  1986   BREAST BIOPSY Right 09/2019   BUNIONECTOMY Left    bunion removal   CERVICAL DISC SURGERY  2005   C5-C7   COLONOSCOPY     core breast biopsy      DENTAL SURGERY     fallopian tue removed     LAPAROSCOPIC BILATERAL SALPINGECTOMY Left 02/09/2016   Procedure: LAPAROSCOPIC LEFT  SALPINGECTOMY;  Surgeon: Janyth Contes, MD;  Location: [facility];  Service: Gynecology;  Laterality: Left;   LAPAROSCOPIC LYSIS OF ADHESIONS  02/09/2016   Procedure: LAPAROSCOPIC LYSIS OF ADHESIONS;  Surgeon:  Janyth Contes, MD;  Location: [facility];  Service: Gynecology;;  momentum to adenexa   LAPAROSCOPIC OVARIAN CYSTECTOMY Right 02/09/2016   Procedure: LAPAROSCOPIC OVARIAN CYSTECTOMY;  Surgeon: Janyth Contes, MD;  Location: [facility];  Service: Gynecology;  Laterality: Right;  x2 to right ovary   LEEP  1998   OOPHORECTOMY Left 02/09/2016   Procedure: LEFT OOPHORECTOMY;  Surgeon: Janyth Contes, MD;  Location: [facility];  Service: Gynecology;  Laterality: Left;- pt states left ovary has NOT been removed    POLYPECTOMY     TONSILLECTOMY  2001   UPPER GASTROINTESTINAL ENDOSCOPY     UPPER  GI ENDOSCOPY      SOCIAL HISTORY: Social History   Socioeconomic History   Marital status: Married    Spouse name: Not on file   Number of children: 0   Years of education: Not on file   Highest education level: Not on file  Occupational History   Occupation: novelist    Comment: has been accepted into [employer]..   Occupation: Pharmacist, hospital   Occupation: Curator  Tobacco Use   Smoking status: Former    Packs/day: 0.00    Years: 0.00    Total pack years: 0.00    Types: Cigarettes    Quit date: 11/02/2015    Years since quitting: 6.5   Smokeless tobacco: Never   Tobacco comments:    currently smoking, began at beginning of June and plans to quit on June 14th. Has smoked off and on in the past.  Vaping Use   Vaping Use: Every day  Substance and Sexual Activity   Alcohol use: Yes    Alcohol/week: 1.0 - 2.0 standard drink of alcohol    Types: 1 - 2 Standard drinks or equivalent per week    Comment: occasionally 0-2 per day- rare    Drug use: No   Sexual activity: Yes  Other Topics Concern   Not on file  Social History Narrative   Not on file   Social Determinants of Health   Financial Resource Strain: Not on file  Food Insecurity: Not on file  Transportation Needs: Not on file  Physical Activity: Not on file  Stress: Not on file  Social Connections: Not on file  Intimate Partner Violence: Not on file    FAMILY HISTORY: Family History  Adopted: Yes  Problem Relation Age of Onset   COPD Mother    Breast cancer Mother    Other Mother        Teeth problems- all removed by age 48   Rashes / Skin problems Sister    Other Sister        Teeth problems- all removed by age 8   Rashes / Skin problems Brother    Asthma Brother    Other Brother        Teeth problems- all removed by age 40   Breast cancer Maternal Aunt    Adrenal disorder Neg Hx    Colon cancer Neg Hx    Colon polyps Neg Hx    Esophageal cancer Neg Hx    Stomach cancer Neg  Hx    Rectal cancer Neg Hx     ALLERGIES:  is allergic to clindamycin/lincomycin, codeine, cyclobenzaprine, gabapentin, nyquil multi-symptom [pseudoeph-doxylamine-dm-apap], uribel [meth-hyo-m bl-na phos-ph sal], xanax [alprazolam], and urelle.  MEDICATIONS:  Current Outpatient Medications  Medication Sig Dispense Refill   Ascorbic Acid (VITAMIN C) 100 MG tablet Take 100 mg by mouth daily.     clonazePAM (  KLONOPIN) 0.5 MG tablet Take 1 tablet (0.5 mg total) by mouth 2 (two) times daily as needed for anxiety. 20 tablet 1   Copper Gluconate (COPPER CAPS) 2 MG CAPS Take 2 mg by mouth as directed. 80 capsule 0   famotidine (PEPCID) 20 MG tablet Take 10 mg by mouth daily.     imatinib (GLEEVEC) 100 MG tablet Take 2 tablets once daily with meals and large glass of water.Caution:Chemotherapy 60 tablet 1   loratadine (CLARITIN) 10 MG tablet Take 10 mg by mouth daily.     magnesium oxide (MAG-OX) 400 (240 Mg) MG tablet TAKE 1 TABLET BY MOUTH EVERY DAY 90 tablet 1   Oyster Shell Calcium 500 MG TABS Please specify directions, refills and quantity 30 tablet 2   No current facility-administered medications for this visit.    REVIEW OF SYSTEMS:   10 Point review of Systems was done is negative except as noted above.  PHYSICAL EXAMINATION: .BP 124/83   Pulse 90   Temp 97.9 F (36.6 C)   Resp 20   Wt 187 lb (84.8 kg)   SpO2 97%   BMI 30.18 kg/m  . GENERAL:alert, in no acute distress and comfortable SKIN: no acute rashes, no significant lesions EYES: conjunctiva are pink and non-injected, sclera anicteric OROPHARYNX: MMM, no exudates, no oropharyngeal erythema or ulceration NECK: supple, no JVD LYMPH:  no palpable lymphadenopathy in the cervical, axillary or inguinal regions LUNGS: clear to auscultation b/l with normal respiratory effort HEART: regular rate & rhythm ABDOMEN:  normoactive bowel sounds , non tender, not distended. Extremity: no pedal edema PSYCH: alert & oriented x 3 with  fluent speech NEURO: no focal motor/sensory deficits   LABORATORY DATA:  I have reviewed the data as listed      Latest Ref Rng & Units 05/17/2022   10:26 AM 05/08/2022    1:00 PM 02/03/2022    2:30 PM  CBC  WBC 4.0 - 10.5 K/uL 9.7  8.8  9.3   Hemoglobin 12.0 - 15.0 g/dL 14.3  13.5  13.6   Hematocrit 36.0 - 46.0 % 40.5  39.1  39.1   Platelets 150 - 400 K/uL 324  285  307     CBC    Component Value Date/Time   WBC 9.7 05/17/2022 1026   WBC 8.4 07/20/2021 1559   RBC 4.62 05/17/2022 1026   HGB 14.3 05/17/2022 1026   HGB 15.1 03/26/2017 1101   HCT 40.5 05/17/2022 1026   HCT 44.0 03/26/2017 1101   PLT 324 05/17/2022 1026   PLT 330 03/26/2017 1101   MCV 87.7 05/17/2022 1026   MCV 84 03/26/2017 1101   MCH 31.0 05/17/2022 1026   MCHC 35.3 05/17/2022 1026   RDW 13.2 05/17/2022 1026   RDW 14.0 03/26/2017 1101   LYMPHSABS 2.8 05/17/2022 1026   LYMPHSABS 2.8 03/26/2017 1101   MONOABS 0.7 05/17/2022 1026   EOSABS 0.5 05/17/2022 1026   EOSABS 0.4 03/26/2017 1101   BASOSABS 0.1 05/17/2022 1026   BASOSABS 0.2 03/26/2017 1101     .    Latest Ref Rng & Units 05/17/2022   10:26 AM 05/08/2022    1:00 PM 02/03/2022    2:30 PM  CMP  Glucose 70 - 99 mg/dL 92  99  101   BUN 6 - 20 mg/dL 11  10  11   $ Creatinine 0.44 - 1.00 mg/dL 0.92  0.86  0.96   Sodium 135 - 145 mmol/L 140  140  141  Potassium 3.5 - 5.1 mmol/L 4.3  4.2  4.1   Chloride 98 - 111 mmol/L 105  105  106   CO2 22 - 32 mmol/L 32  31  31   Calcium 8.9 - 10.3 mg/dL 9.7  9.3  9.4   Total Protein 6.5 - 8.1 g/dL 7.0  6.3  6.8   Total Bilirubin 0.3 - 1.2 mg/dL 0.4  0.3  0.4   Alkaline Phos 38 - 126 U/L 80  76  78   AST 15 - 41 U/L 19  19  21   $ ALT 0 - 44 U/L 22  21  23     $ 05/16/18 Cytogenetics:   04/10/18 Hemochromatosis Panel:    RADIOGRAPHIC STUDIES: I have personally reviewed the radiological images as listed and agreed with the findings in the report. No results found.  01/31/18 ECHO  Transthoracic:     ASSESSMENT & PLAN:   52 y.o. female with  1. CML- Chronic phase 05/16/18 BM Cytogenetics revealed translocation 9;22. FISH report revealed BCR-ABL gene rearrangement present in 86.5% of cells 06/03/18 FISH revealed BCR-ABL gene rearrangement present in 85.67% of cells, with some hypercellularity around 80% 06/05/18 US Abdomen revealed normal spleen size 01/31/18 ECHO revealed LV EF of 63%, no regional wall motion abnormalities, no significant valvular hear disease  Initial pre-treatment 07/25/18 BCR-ABL revealed IS at 71.1362%  08/28/18 EKG reviewed  2. 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 Mild copper deficiency- replaced.  PLAN: -discussed lab results from today, 05/17/2022, with the patient. CBC and CMP are stable.  -discussed the recent bcr/abl results from 05/08/2022, which showed undetectable.  -Patient is in major molecular remission, and discussed the option of trying and taking treatment break, -Discussed the option to decrease the Imatinib dosage. -Discussed the treatment options, including Sprycel, other than Imatinib. -recommended to follow-up with PCP for stress test and shortness of breath.  -Answered all of patient's questions regarding her CML, Imatinib, and other treatment options.  -Patient allows her permission to write her diagnosis in letter for her husband's work.  -Patient agrees with the option of switching to low dose of Sprycel 50 mg daily from Imatinib and see if it helps improve her symptoms.  -Patient agrees to have EKG today for baseline  -Plan on chest X-ray and ECHO.    FOLLOW-UP: EKG today CXR today ECHO in 1 week Switching to Dasatinib Phone visit with Dr Irene Limbo in 6 weeks (for toxicity check) with labs the days before  The total time spent in the appointment was 32 minutes* .  All of the patient's questions were answered with apparent satisfaction. The patient knows  to call the clinic with any problems, questions or concerns.   Sullivan Lone MD MS AAHIVMS [facility] Hematology/Oncology Physician [facility]  .*Total Encounter Time as defined by the Centers for Medicare and Medicaid Services includes, in addition to the face-to-face time of a patient visit (documented in the note above) non-face-to-face time: obtaining and reviewing outside history, ordering and reviewing medications, tests or procedures, care coordination (communications with other health care professionals or caregivers) and documentation in the medical record.   I, Cleda Mccreedy, am acting as a Education administrator for Sullivan Lone, MD. .I have reviewed the above documentation for accuracy and completeness, and I agree with the above. Brunetta Genera MD

## 2022-05-18 ENCOUNTER — Telehealth: Payer: Self-pay | Admitting: Hematology

## 2022-05-18 NOTE — Telephone Encounter (Signed)
Called patient per 2/14 los notes to schedule f/u. Left voicemail with new appointment information and contact details if needing to reschedule.

## 2022-05-23 ENCOUNTER — Encounter: Payer: Self-pay | Admitting: Family

## 2022-05-23 MED ORDER — DASATINIB 50 MG PO TABS
100.0000 mg | ORAL_TABLET | Freq: Every day | ORAL | 1 refills | Status: DC
  Filled 2022-05-23: qty 30, 15d supply, fill #0

## 2022-05-23 NOTE — Progress Notes (Unsigned)
[facility] [address], [place], [place] [address] [phone]  Date:  05/24/2022   Name:  Victoria Hall   DOB:  07-25-1970   MRN:  [id number]  PCP:  Darreld Mclean, MD    Chief Complaint: posssible hernia (Right side under breast)   History of Present Illness:  Victoria Hall is a 52 y.o. very pleasant female patient who presents with the following:  Patient seen today with concern of a possible hernia Most recent visit with myself was in November Patient with history of CML, POTS, mast cell activation syndrome, hypermobility, hemochromatosis, chronic fatigue. Her oncologist is Dr. Irene Limbo  She was seen by oncology about a week ago 1. CML- Chronic phase 05/16/18 BM Cytogenetics revealed translocation 9;22. FISH report revealed BCR-ABL gene rearrangement present in 86.5% of cells 06/03/18 FISH revealed BCR-ABL gene rearrangement present in 85.67% of cells, with some hypercellularity around 80% 06/05/18 US Abdomen revealed normal spleen size 01/31/18 ECHO revealed LV EF of 63%, no regional wall motion abnormalities, no significant valvular hear disease   Initial pre-treatment 07/25/18 BCR-ABL revealed IS at 71.1362%   2. 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 Mild copper deficiency- replaced.   PLAN: -discussed lab results from today, 05/17/2022, with the patient. CBC and CMP are stable.  -discussed the recent bcr/abl results from 05/08/2022, which showed undetectable.  -Patient is in major molecular remission, and discussed the option of trying and taking treatment break, -Discussed the option to decrease the Imatinib dosage. -Discussed the treatment options, including Sprycel, other than Imatinib. -recommended to follow-up with PCP for stress test and shortness of breath.  -Answered all of patient's questions regarding her CML, Imatinib,  and other treatment options.  -Patient allows her permission to write her diagnosis in letter for her husband's work.  -Patient agrees with the option of switching to low dose of Sprycel 50 mg daily from Imatinib and see if it helps improve her symptoms.  -Patient agrees to have EKG today for baseline  -Plan on chest X-ray and ECHO.     She has noted a possible hernia or some other defect under her right ribs She has noted this for a year or longer- perhaps 2 years On occasion if she moves a certain way she will have a pain under her right breast and has to push on the area like something has to be pushed back in.  However, she cannot feel a bulge  It may occur 2-3x a week. Does not occur when she is supine- can occur when sitting or standing She cannot recall any inciting injury   Pt notes that her anion gap has been low and is concerned about what this might mean She does not take lithium She does use magnesium, and her protein/ albumin is normal  Low immunoglobulins can also be implicated in a low anion gap, but I am uncertain how to order the correct immunoglobulin level  Patient Active Problem List   Diagnosis Date Noted   Flushing 08/08/2019   CML (chronic myelocytic leukemia) ([place]) 06/03/2018   Carrier of hemochromatosis HFE gene mutation 04/18/2018   Hypermobility syndrome 09/26/2017   Mast cell activation syndrome ([place]) 09/26/2017   POTS (postural orthostatic tachycardia syndrome) 08/10/2017   Incomplete right bundle branch block 07/11/2017   Fatigue 07/11/2017   Low back pain 07/21/2016   Right knee pain 06/05/2016   Left  shoulder pain 06/05/2016   Hydrosalpinx 02/08/2016   Hemorrhagic ovarian cyst 02/08/2016   Right hip pain 02/03/2016   HYDROSALPINX 02/07/2008   HEADACHE 10/28/2007   TACHYCARDIA 10/14/2007   ANXIETY DISORDER 05/28/2007   POLYARTHRITIS 05/09/2007   UNSPECIFIED DISORDERS OF NERVOUS SYSTEM 04/22/2007   ABDOMINAL PAIN RIGHT LOWER QUADRANT 04/08/2007    PALPITATIONS, RECURRENT 11/07/2006   PROBLEMS W/SMELL/TASTE 06/29/2006   TOBACCO ABUSE 01/08/2006   VISUAL IMPAIRMENT 01/08/2006   DISTURBANCE, VISUAL NOS 01/08/2006   FIBROCYSTIC BREAST DISEASE 01/08/2006   [place] DISEASE, CERVICAL 01/08/2006   VERTIGO 01/08/2006    Past Medical History:  Diagnosis Date   Anxiety    [facility]- [facility] Low BP    Arthritis    Asthma    early teens only    Cervical intraepithelial neoplasia grade 3 2002   s/p LEEP Rx repeat pap negative   Cervical radiculopathy    CML (chronic myelocytic leukemia) ([place]) 06/03/2018   Fallopian tube disorder    right fallopian tube removed   Gallstones 09/2015   On CT   Headache    Migraines   Hemorrhagic ovarian cyst 02/08/2016   Hydrosalpinx    [facility]   Hydrosalpinx 02/08/2016   Interstitial cystitis    Neurological abnormality    Neg workup with MRI/MRA in 2007 WNL except decreased caliber MCA proximally, distal cavernous portion of left LCA which may represent true stenosis or technique on exam. Evaluated by Dr Linus Salmons.   Palpitations    Partial seizures (HCC)    questionable diag   Pneumonia    walking pneumonia   Polycystic ovary    multiple ovarian cysts removed   S/P cervical discectomy    Dr Vertell Limber, anterior discectomy C5-C7   S/P epidural steroid injection    Right hip   Seizures ([place]) 2005   simple partial seizure disorder-was mast cell problems per pt 09-15-2019- no seizures per pt     Past Surgical History:  Procedure Laterality Date   APPENDECTOMY  1986   BREAST BIOPSY Right 09/2019   BUNIONECTOMY Left    bunion removal   CERVICAL DISC SURGERY  2005   C5-C7   COLONOSCOPY     core breast biopsy      DENTAL SURGERY     fallopian tue removed     LAPAROSCOPIC BILATERAL SALPINGECTOMY Left 02/09/2016   Procedure: LAPAROSCOPIC LEFT  SALPINGECTOMY;  Surgeon: Janyth Contes, MD;  Location: [facility];  Service: Gynecology;  Laterality: Left;    LAPAROSCOPIC LYSIS OF ADHESIONS  02/09/2016   Procedure: LAPAROSCOPIC LYSIS OF ADHESIONS;  Surgeon: Janyth Contes, MD;  Location: [facility];  Service: Gynecology;;  momentum to adenexa   LAPAROSCOPIC OVARIAN CYSTECTOMY Right 02/09/2016   Procedure: LAPAROSCOPIC OVARIAN CYSTECTOMY;  Surgeon: Janyth Contes, MD;  Location: [facility];  Service: Gynecology;  Laterality: Right;  x2 to right ovary   LEEP  1998   OOPHORECTOMY Left 02/09/2016   Procedure: LEFT OOPHORECTOMY;  Surgeon: Janyth Contes, MD;  Location: [facility];  Service: Gynecology;  Laterality: Left;- pt states left ovary has NOT been removed    POLYPECTOMY     TONSILLECTOMY  2001   UPPER GASTROINTESTINAL ENDOSCOPY     UPPER GI ENDOSCOPY      Social History   Tobacco Use   Smoking status: Former    Packs/day: 0.00    Years: 0.00    Total pack years: 0.00    Types: Cigarettes    Quit date:  11/02/2015    Years since quitting: 6.5   Smokeless tobacco: Never   Tobacco comments:    currently smoking, began at beginning of June and plans to quit on June 14th. Has smoked off and on in the past.  Vaping Use   Vaping Use: Every day  Substance Use Topics   Alcohol use: Yes    Alcohol/week: 1.0 - 2.0 standard drink of alcohol    Types: 1 - 2 Standard drinks or equivalent per week    Comment: occasionally 0-2 per day- rare    Drug use: No    Family History  Adopted: Yes  Problem Relation Age of Onset   COPD Mother    Breast cancer Mother    Other Mother        Teeth problems- all removed by age 64   Rashes / Skin problems Sister    Other Sister        Teeth problems- all removed by age 24   Rashes / Skin problems Brother    Asthma Brother    Other Brother        Teeth problems- all removed by age 4   Breast cancer Maternal Aunt    Adrenal disorder Neg Hx    Colon cancer Neg Hx    Colon polyps Neg Hx    Esophageal cancer Neg Hx    Stomach cancer Neg Hx    Rectal cancer Neg Hx     Allergies  Allergen  Reactions   Clindamycin/Lincomycin Itching and Other (See Comments)    Dizziness, trouble swallowing   Codeine Itching and Nausea And Vomiting   Cyclobenzaprine Other (See Comments)    Fast heartbeat, restless leg, nightmares   Gabapentin Other (See Comments)    Headaches, visual disturbances.    Nyquil Multi-Symptom [Pseudoeph-Doxylamine-Dm-Apap] Other (See Comments)    Heart racing, dizziness, weakness   Uribel [Meth-Hyo-M Bl-Na Phos-Ph Sal] Other (See Comments)    Heart racing, dizziness, blurry vision.   Xanax [Alprazolam]     Bruising   Urelle Nausea And Vomiting    Medication list has been reviewed and updated.  Current Outpatient Medications on File Prior to Visit  Medication Sig Dispense Refill   Ascorbic Acid (VITAMIN C) 100 MG tablet Take 100 mg by mouth daily.     clonazePAM (KLONOPIN) 0.5 MG tablet Take 1 tablet (0.5 mg total) by mouth 2 (two) times daily as needed for anxiety. 20 tablet 1   Copper Gluconate (COPPER CAPS) 2 MG CAPS Take 2 mg by mouth as directed. 80 capsule 0   No current facility-administered medications on file prior to visit.    Review of Systems:  As per HPI- otherwise negative.     Physical Examination: Vitals:   05/24/22 0859  BP: (!) 143/78  Pulse: 98  Resp: 16  Temp: 98.2 F (36.8 C)  SpO2: 98%   Vitals:   05/24/22 0859  Weight: 189 lb (85.7 kg)  Height: 5' 6"$  (1.676 m)   Body mass index is 30.51 kg/m. Ideal Body Weight: Weight in (lb) to have BMI = 25: 154.6  GEN: no acute distress.  Obese, looks well HEENT: Atraumatic, Normocephalic.  Ears and Nose: No external deformity. CV: RRR, No M/G/R. No JVD. No thrill. No extra heart sounds. PULM: CTA B, no wheezes, crackles, rhonchi. No retractions. No resp. distress. No accessory muscle use. ABD: S, NT, ND, +BS. No rebound. No HSM. EXTR: No c/c/e PSYCH: Normally interactive. Conversant.  I am unable to reproduce  the abdominal pain which has been concerning the patient.  We  examined her both standing and supine, and with Valsalva.  No pain or bulge is noted abdomen normal  Assessment and Plan: Narrow anion gap - Plan: Magnesium  Elevated blood pressure reading  Abdominal wall asymmetry - Plan: CT ABDOMEN WO CONTRAST\  Patient seen today with concern of a possible ventral hernia.  On exam I am not able to appreciate any palpable abnormality.  We will obtain a CT abdomen without contrast on the advice of radiology for further evaluation Discussed her low anion gap.  Will start by checking her magnesium level.  If this is not forthcoming we can look further Noted blood pressure mildly elevated today.  She does have a blood pressure cuff and will monitor her numbers at work, she will let me know if her blood pressure remains high  Signed Lamar Blinks, MD

## 2022-05-24 ENCOUNTER — Other Ambulatory Visit: Payer: Self-pay

## 2022-05-24 ENCOUNTER — Telehealth: Payer: Self-pay | Admitting: Pharmacy Technician

## 2022-05-24 ENCOUNTER — Other Ambulatory Visit (HOSPITAL_COMMUNITY): Payer: Self-pay

## 2022-05-24 ENCOUNTER — Ambulatory Visit: Payer: 59 | Admitting: Family Medicine

## 2022-05-24 ENCOUNTER — Encounter: Payer: Self-pay | Admitting: Family Medicine

## 2022-05-24 ENCOUNTER — Telehealth: Payer: Self-pay

## 2022-05-24 ENCOUNTER — Other Ambulatory Visit: Payer: Self-pay | Admitting: Hematology

## 2022-05-24 VITALS — BP 143/78 | HR 98 | Temp 98.2°F | Resp 16 | Ht 66.0 in | Wt 189.0 lb

## 2022-05-24 DIAGNOSIS — R030 Elevated blood-pressure reading, without diagnosis of hypertension: Secondary | ICD-10-CM | POA: Diagnosis not present

## 2022-05-24 DIAGNOSIS — R198 Other specified symptoms and signs involving the digestive system and abdomen: Secondary | ICD-10-CM

## 2022-05-24 DIAGNOSIS — C9210 Chronic myeloid leukemia, BCR/ABL-positive, not having achieved remission: Secondary | ICD-10-CM

## 2022-05-24 DIAGNOSIS — E878 Other disorders of electrolyte and fluid balance, not elsewhere classified: Secondary | ICD-10-CM

## 2022-05-24 MED ORDER — DASATINIB 50 MG PO TABS: 50.0000 mg | ORAL_TABLET | Freq: Every day | ORAL | 1 refills | Status: DC

## 2022-05-24 NOTE — Telephone Encounter (Addendum)
Oral Oncology Pharmacy Student Encounter  Received new prescription for Sprycel (dasatinib) for the treatment of CML, planned duration until disease progression or unacceptable toxicity.  Labs from 05/17/2022 assessed, no concerns at this time. ECG obtained 05/17/2022, Qtc 460 ms (WNL).  Current medication list in Epic reviewed, DDIs with famotidine and vitamin supplements (magnesium oxide and calcium) identified: famotidine and vitamin supplements will bind with dasatinib and decrease dasatinib exposure. Recommend switching from famotidine to TUMS PRN at least 2 hours before or 2 hours after dasatinib administration. Also recommend separating vitamin supplements from dasatinib by at least 2 hours as well.  Prescription dose and frequency assessed for appropriateness.  Evaluated chart and no patient barriers to medication adherence noted.   Patient agreement for treatment documented in MD note on 05/17/2022.  Prescription has been e-scribed to the [facility] for benefits analysis and approval.  Oral Oncology Clinic will continue to follow for insurance authorization, copayment issues, initial counseling and start date.   [place] [facility] Pharmacy PharmD Candidate [phone]  05/24/2022 9:16 AM Oral Oncology Clinic [phone]

## 2022-05-24 NOTE — Telephone Encounter (Signed)
Oral Oncology Patient Advocate Encounter  Prior Authorization for Sprycel has been approved.    PA# [id number] Effective dates: 05/24/22 through 12/23/22  Patient must fill at [facility].    Lady Deutscher, CPhT-Adv Oncology Pharmacy Patient [place] Direct Number: [phone]  Fax: [phone]

## 2022-05-24 NOTE — Patient Instructions (Addendum)
Let's check your magnesium today- as far as immunoglobulin testing for the low anion gap, I will need to figure out exactly what this means I will contact radiology about CT vs MRI vs [place] for your possible hernia

## 2022-05-24 NOTE — Telephone Encounter (Signed)
Oral Oncology Patient Advocate Encounter   Received notification that prior authorization for Sprycel is required.   PA submitted on 05/24/22 Key [id number] Status is pending     Lady Deutscher, CPhT-Adv Oncology Pharmacy Patient [place] Direct Number: [phone]  Fax: [phone]

## 2022-05-25 ENCOUNTER — Encounter: Payer: Self-pay | Admitting: Hematology

## 2022-05-26 ENCOUNTER — Telehealth: Payer: 59 | Admitting: Hematology

## 2022-05-29 ENCOUNTER — Encounter: Payer: Self-pay | Admitting: Family Medicine

## 2022-05-29 ENCOUNTER — Ambulatory Visit: Payer: 59 | Admitting: Family Medicine

## 2022-05-29 ENCOUNTER — Ambulatory Visit (HOSPITAL_COMMUNITY)
Admission: RE | Admit: 2022-05-29 | Discharge: 2022-05-29 | Disposition: A | Discharge status: Home / Self Care | Payer: 59 | Source: Ambulatory Visit | Attending: Hematology | Admitting: Hematology

## 2022-05-29 DIAGNOSIS — R0602 Shortness of breath: Secondary | ICD-10-CM | POA: Insufficient documentation

## 2022-05-29 DIAGNOSIS — C9210 Chronic myeloid leukemia, BCR/ABL-positive, not having achieved remission: Secondary | ICD-10-CM

## 2022-05-29 DIAGNOSIS — J45909 Unspecified asthma, uncomplicated: Secondary | ICD-10-CM | POA: Diagnosis not present

## 2022-05-29 DIAGNOSIS — Z0189 Encounter for other specified special examinations: Secondary | ICD-10-CM | POA: Diagnosis not present

## 2022-05-29 DIAGNOSIS — Z87891 Personal history of nicotine dependence: Secondary | ICD-10-CM | POA: Insufficient documentation

## 2022-05-29 DIAGNOSIS — E878 Other disorders of electrolyte and fluid balance, not elsewhere classified: Secondary | ICD-10-CM

## 2022-05-29 LAB — ECHOCARDIOGRAM COMPLETE
Area-P 1/2: 2.83 cm2
Calc EF: 61.9 %
MV VTI: 4.75 cm2
S' Lateral: 4 cm
Single Plane A2C EF: 64.1 %
Single Plane A4C EF: 60.3 %

## 2022-05-29 NOTE — Progress Notes (Signed)
  Echocardiogram 2D Echocardiogram has been performed.  Victoria Hall 05/29/2022, 9:00 AM

## 2022-05-29 NOTE — Telephone Encounter (Addendum)
Oral Chemotherapy Pharmacy Student Encounter  Start date: Patient has not received medication, indicates she is waiting for results on ECG to start.  Patient Education I spoke with patient for overview of new oral chemotherapy medication: Sprycel (dasatinib) for the treatment of CML, planned duration until disease progression or unacceptable toxicity.   Pt is doing well. Counseled patient on administration, dosing, side effects, monitoring, drug-food interactions, safe handling, storage, and disposal. Patient will take 1 tablet (50 mg) by mouth once daily.  Patient was informed again about the drug interaction with famotidine, states that she is taking it for the histaminergic properties as opposed to the acid reflux aid. Informed that concurrent use of famotidine and dasatinib is not recommended; she will be discussing this with her immunologist to find alternate treatment. Patient inquired about the specific mechanism through which interaction occurs, informed that the decreased stomach acidity results in decreased concentrations and efficacy of datasinib.   Side effects include but not limited to:  Nausea (discussed taking with food if experiencing nausea) Edema (discussed alerting Dr. Irene Limbo should patient develop any lower extremity swelling) Periodic lab work to monitor cell counts (patient inquired about how often these will occur, informed that it has not yet been decided but someone will reach out to her at that time). Patient scheduled for labs on 06/19/2022. Headache (discussed that headache is extremely receptive to caffeine treatment) Pleural effusion (patient inquired about incidence of pleural effusion, informed that it was seen in clinical trials but not something that happens extremely often in clinical practice. Informed to alter Dr. Irene Limbo should she develop shortness of breath)  Reviewed with patient importance of keeping a medication schedule and plan for any missed doses.  After  discussion with patient, no patient barriers to medication adherence identified.   Ms. Alsman voiced understanding and appreciation. All questions answered. Medication handout provided.  Provided patient with [facility] phone number. Patient knows to call the office with questions or concerns.  [employer] of Pharmacy PharmD Candidate [phone] 05/29/2022 1:34 PM

## 2022-06-05 ENCOUNTER — Encounter: Payer: Self-pay | Admitting: Family Medicine

## 2022-06-05 ENCOUNTER — Other Ambulatory Visit (INDEPENDENT_AMBULATORY_CARE_PROVIDER_SITE_OTHER): Payer: 59

## 2022-06-05 ENCOUNTER — Other Ambulatory Visit: Payer: 59

## 2022-06-05 DIAGNOSIS — E878 Other disorders of electrolyte and fluid balance, not elsewhere classified: Secondary | ICD-10-CM

## 2022-06-05 LAB — MAGNESIUM: Magnesium: 1.8 mg/dL (ref 1.5–2.5)

## 2022-06-14 ENCOUNTER — Telehealth (HOSPITAL_BASED_OUTPATIENT_CLINIC_OR_DEPARTMENT_OTHER): Payer: Self-pay

## 2022-06-16 ENCOUNTER — Other Ambulatory Visit: Payer: Self-pay | Admitting: *Deleted

## 2022-06-16 DIAGNOSIS — C9210 Chronic myeloid leukemia, BCR/ABL-positive, not having achieved remission: Secondary | ICD-10-CM

## 2022-06-18 NOTE — Progress Notes (Signed)
[place] [facility] [address] [place], [place] [address] [phone]  Date:  06/21/2022   Name:  Victoria Hall   DOB:  05-29-1970   MRN:  [id number]  PCP:  Darreld Mclean, MD    Chief Complaint: No chief complaint on file.   History of Present Illness:  Victoria Hall is a 52 y.o. very pleasant female patient who presents with the following:  Pt seen today with a couple of concerns- she has noted pain in her foot and her neck Last seen by myself last month  Patient with history of CML, POTS, mast cell activation syndrome, hypermobility, hemochromatosis, chronic fatigue. Her oncologist is Dr. Irene Limbo    Patient Active Problem List   Diagnosis Date Noted   Flushing 08/08/2019   CML (chronic myelocytic leukemia) ([place]) 06/03/2018   Carrier of hemochromatosis HFE gene mutation 04/18/2018   Hypermobility syndrome 09/26/2017   Mast cell activation syndrome ([place]) 09/26/2017   POTS (postural orthostatic tachycardia syndrome) 08/10/2017   Incomplete right bundle branch block 07/11/2017   Fatigue 07/11/2017   Low back pain 07/21/2016   Right knee pain 06/05/2016   Left shoulder pain 06/05/2016   Hydrosalpinx 02/08/2016   Hemorrhagic ovarian cyst 02/08/2016   Right hip pain 02/03/2016   HYDROSALPINX 02/07/2008   HEADACHE 10/28/2007   TACHYCARDIA 10/14/2007   ANXIETY DISORDER 05/28/2007   POLYARTHRITIS 05/09/2007   UNSPECIFIED DISORDERS OF NERVOUS SYSTEM 04/22/2007   ABDOMINAL PAIN RIGHT LOWER QUADRANT 04/08/2007   PALPITATIONS, RECURRENT 11/07/2006   PROBLEMS W/SMELL/TASTE 06/29/2006   TOBACCO ABUSE 01/08/2006   VISUAL IMPAIRMENT 01/08/2006   DISTURBANCE, VISUAL NOS 01/08/2006   FIBROCYSTIC BREAST DISEASE 01/08/2006   [place] DISEASE, CERVICAL 01/08/2006   VERTIGO 01/08/2006    Past Medical History:  Diagnosis Date   Anxiety    Past hx- [facility] due to Low BP    Arthritis    Asthma     early teens only    Cervical intraepithelial neoplasia grade 3 2002   s/p LEEP Rx repeat pap negative   Cervical radiculopathy    CML (chronic myelocytic leukemia) ([place]) 06/03/2018   Fallopian tube disorder    right fallopian tube removed   Gallstones 09/2015   On CT   Headache    Migraines   Hemorrhagic ovarian cyst 02/08/2016   Hydrosalpinx    [facility]   Hydrosalpinx 02/08/2016   Interstitial cystitis    Neurological abnormality    Neg workup with MRI/MRA in 2007 WNL except decreased caliber MCA proximally, distal cavernous portion of left LCA which may represent true stenosis or technique on exam. Evaluated by Dr Linus Salmons.   Palpitations    Partial seizures (HCC)    questionable diag   Pneumonia    walking pneumonia   Polycystic ovary    multiple ovarian cysts removed   S/P cervical discectomy    Dr Vertell Limber, anterior discectomy C5-C7   S/P epidural steroid injection    Right hip   Seizures ([place]) 2005   simple partial seizure disorder-was mast cell problems per pt 09-15-2019- no seizures per pt     Past Surgical History:  Procedure Laterality Date   APPENDECTOMY  1986   BREAST BIOPSY Right 09/2019   BUNIONECTOMY Left    bunion removal   CERVICAL DISC SURGERY  2005   C5-C7   COLONOSCOPY     core breast biopsy  DENTAL SURGERY     fallopian tue removed     LAPAROSCOPIC BILATERAL SALPINGECTOMY Left 02/09/2016   Procedure: LAPAROSCOPIC LEFT  SALPINGECTOMY;  Surgeon: Janyth Contes, MD;  Location: [facility];  Service: Gynecology;  Laterality: Left;   LAPAROSCOPIC LYSIS OF ADHESIONS  02/09/2016   Procedure: LAPAROSCOPIC LYSIS OF ADHESIONS;  Surgeon: Janyth Contes, MD;  Location: [facility];  Service: Gynecology;;  momentum to adenexa   LAPAROSCOPIC OVARIAN CYSTECTOMY Right 02/09/2016   Procedure: LAPAROSCOPIC OVARIAN CYSTECTOMY;  Surgeon: Janyth Contes, MD;  Location: [facility];  Service: Gynecology;  Laterality: Right;  x2 to right ovary   LEEP   1998   OOPHORECTOMY Left 02/09/2016   Procedure: LEFT OOPHORECTOMY;  Surgeon: Janyth Contes, MD;  Location: [facility];  Service: Gynecology;  Laterality: Left;- pt states left ovary has NOT been removed    POLYPECTOMY     TONSILLECTOMY  2001   UPPER GASTROINTESTINAL ENDOSCOPY     UPPER GI ENDOSCOPY      Social History   Tobacco Use   Smoking status: Former    Packs/day: 0.00    Years: 0.00    Additional pack years: 0.00    Total pack years: 0.00    Types: Cigarettes    Quit date: 11/02/2015    Years since quitting: 6.6   Smokeless tobacco: Never   Tobacco comments:    currently smoking, began at beginning of June and plans to quit on June 14th. Has smoked off and on in the past.  Vaping Use   Vaping Use: Every day  Substance Use Topics   Alcohol use: Yes    Alcohol/week: 1.0 - 2.0 standard drink of alcohol    Types: 1 - 2 Standard drinks or equivalent per week    Comment: occasionally 0-2 per day- rare    Drug use: No    Family History  Adopted: Yes  Problem Relation Age of Onset   COPD Mother    Breast cancer Mother    Other Mother        Teeth problems- all removed by age 56   Rashes / Skin problems Sister    Other Sister        Teeth problems- all removed by age 10   Rashes / Skin problems Brother    Asthma Brother    Other Brother        Teeth problems- all removed by age 93   Breast cancer Maternal Aunt    Adrenal disorder Neg Hx    Colon cancer Neg Hx    Colon polyps Neg Hx    Esophageal cancer Neg Hx    Stomach cancer Neg Hx    Rectal cancer Neg Hx     Allergies  Allergen Reactions   Clindamycin/Lincomycin Itching and Other (See Comments)    Dizziness, trouble swallowing   Codeine Itching and Nausea And Vomiting   Cyclobenzaprine Other (See Comments)    Fast heartbeat, restless leg, nightmares   Gabapentin Other (See Comments)    Headaches, visual disturbances.    Nyquil Multi-Symptom [Pseudoeph-Doxylamine-Dm-Apap] Other (See Comments)     Heart racing, dizziness, weakness   Uribel [Meth-Hyo-M Bl-Na Phos-Ph Sal] Other (See Comments)    Heart racing, dizziness, blurry vision.   Xanax [Alprazolam]     Bruising   Urelle Nausea And Vomiting    Medication list has been reviewed and updated.  Current Outpatient Medications on File Prior to Visit  Medication Sig Dispense Refill   Ascorbic Acid (VITAMIN C) 100  MG tablet Take 100 mg by mouth daily.     clonazePAM (KLONOPIN) 0.5 MG tablet Take 1 tablet (0.5 mg total) by mouth 2 (two) times daily as needed for anxiety. 20 tablet 1   Copper Gluconate (COPPER CAPS) 2 MG CAPS Take 2 mg by mouth as directed. 80 capsule 0   SPRYCEL 50 MG tablet TAKE 1 TABLET (50 MG TOTAL) BY MOUTH DAILY. 30 tablet 1   No current facility-administered medications on file prior to visit.    Review of Systems:  As per HPI- otherwise negative.   Physical Examination: There were no vitals filed for this visit. There were no vitals filed for this visit. There is no height or weight on file to calculate BMI. Ideal Body Weight:    GEN: no acute distress. HEENT: Atraumatic, Normocephalic.  Ears and Nose: No external deformity. CV: RRR, No M/G/R. No JVD. No thrill. No extra heart sounds. PULM: CTA B, no wheezes, crackles, rhonchi. No retractions. No resp. distress. No accessory muscle use. ABD: S, NT, ND, +BS. No rebound. No HSM. EXTR: No c/c/e PSYCH: Normally interactive. Conversant.    Assessment and Plan: ***  Signed Lamar Blinks, MD

## 2022-06-19 ENCOUNTER — Inpatient Hospital Stay: Payer: 59 | Attending: Family

## 2022-06-19 DIAGNOSIS — C9210 Chronic myeloid leukemia, BCR/ABL-positive, not having achieved remission: Secondary | ICD-10-CM | POA: Diagnosis present

## 2022-06-19 LAB — CMP (CANCER CENTER ONLY)
ALT: 42 U/L (ref 0–44)
AST: 29 U/L (ref 15–41)
Albumin: 4.1 g/dL (ref 3.5–5.0)
Alkaline Phosphatase: 70 U/L (ref 38–126)
Anion gap: 5 (ref 5–15)
BUN: 11 mg/dL (ref 6–20)
CO2: 29 mmol/L (ref 22–32)
Calcium: 9.4 mg/dL (ref 8.9–10.3)
Chloride: 105 mmol/L (ref 98–111)
Creatinine: 0.87 mg/dL (ref 0.44–1.00)
GFR, Estimated: >60 mL/min (ref >60)
Glucose, Bld: 100 mg/dL — ABNORMAL HIGH (ref 70–99)
Potassium: 4.1 mmol/L (ref 3.5–5.1)
Sodium: 139 mmol/L (ref 135–145)
Total Bilirubin: 0.4 mg/dL (ref 0.3–1.2)
Total Protein: 6.9 g/dL (ref 6.5–8.1)

## 2022-06-19 LAB — CBC WITH DIFFERENTIAL (CANCER CENTER ONLY)
Abs Immature Granulocytes: 0.01 10*3/uL (ref 0.00–0.07)
Basophils Absolute: 0.1 10*3/uL (ref 0.0–0.1)
Basophils Relative: 1 %
Eosinophils Absolute: 0.5 10*3/uL (ref 0.0–0.5)
Eosinophils Relative: 5 %
HCT: 37.7 % (ref 36.0–46.0)
Hemoglobin: 13 g/dL (ref 12.0–15.0)
Immature Granulocytes: 0 %
Lymphocytes Relative: 37 %
Lymphs Abs: 3.5 10*3/uL (ref 0.7–4.0)
MCH: 30.2 pg (ref 26.0–34.0)
MCHC: 34.5 g/dL (ref 30.0–36.0)
MCV: 87.7 fL (ref 80.0–100.0)
Monocytes Absolute: 0.6 10*3/uL (ref 0.1–1.0)
Monocytes Relative: 7 %
Neutro Abs: 4.8 10*3/uL (ref 1.7–7.7)
Neutrophils Relative %: 50 %
Platelet Count: 308 10*3/uL (ref 150–400)
RBC: 4.3 MIL/uL (ref 3.87–5.11)
RDW: 12.7 % (ref 11.5–15.5)
WBC Count: 9.5 10*3/uL (ref 4.0–10.5)
nRBC: 0 % (ref 0.0–0.2)

## 2022-06-20 ENCOUNTER — Inpatient Hospital Stay (HOSPITAL_BASED_OUTPATIENT_CLINIC_OR_DEPARTMENT_OTHER): Payer: 59 | Admitting: Hematology

## 2022-06-20 DIAGNOSIS — C9210 Chronic myeloid leukemia, BCR/ABL-positive, not having achieved remission: Secondary | ICD-10-CM

## 2022-06-20 NOTE — Progress Notes (Signed)
HEMATOLOGY/ONCOLOGY CLINIC VISIT NOTE  Date of Service: 06/20/22   Patient Care Team: Copland, Gay Filler, MD as PCP - General (Family Medicine)  CHIEF COMPLAINTS/PURPOSE OF CONSULTATION:  For continued evaluation and management of CML  HISTORY OF PRESENTING ILLNESS:  Please see previous note for details of initial presentation.  Interval History:    Victoria Hall 52 years old female who is contacted via phone for continued evaluation and management of CML.  Patient was last seen by me on 05/17/2022 and she complained of worsening muscle spasm and shortness of breath from Imatinib.  .I connected with Arne Cleveland on 06/20/2022 at  8:40 AM EDT by telephone visit and verified that I am speaking with the correct person using two identifiers.   Patient reports she is doing fairly well since our last visit. She reports she discontinued Imatinib around the end of February and started Sprycel 50 mg on 06/06/2022. She notes that her muscle spasm improved after discounting Imatinib. She denies muscle spasm and nausea with Sprycel.   She notes that she has been tolerating Sprycel fairly well. Patient reports that she had insomnia for the first week of starting Sprycel, but it has been slowly improving. Patient complains of dizziness, vertigo, headache, early satiety, and skipping heartbeat after starting Sprycel. She notes that her headache improves after drinking caffeinated beverage. Patient notes that she has not been eating well due to early satiety. She notes that she had similar issue of skipping heart beat issue in the past when she first started Imatinib, which resolved on it's own. She did visit Cardiologist.   She also complains of intermittent joint pain but is unsure if it is from starting Sprycel of discontinuing her H2 blocker.   Patient notes that she had dizziness issues in the past around 2009 and has been to Cardiologist, who did not find any  abnormalities.   I discussed the limitations, risks, security and privacy concerns of performing an evaluation and management service by telemedicine and the availability of in-person appointments. I also discussed with the patient that there may be a patient responsible charge related to this service. The patient expressed understanding and agreed to proceed.   Other persons participating in the visit and their role in the encounter: None   Patient's location: Home  Provider's location: [facility]   Chief Complaint: CML     MEDICAL HISTORY:  Past Medical History:  Diagnosis Date   Anxiety    Past hx- [facility] due to Low BP    Arthritis    Asthma    early teens only    Cervical intraepithelial neoplasia grade 3 2002   s/p LEEP Rx repeat pap negative   Cervical radiculopathy    CML (chronic myelocytic leukemia) ([place]) 06/03/2018   Fallopian tube disorder    right fallopian tube removed   Gallstones 09/2015   On CT   Headache    Migraines   Hemorrhagic ovarian cyst 02/08/2016   Hydrosalpinx    [facility]   Hydrosalpinx 02/08/2016   Interstitial cystitis    Neurological abnormality    Neg workup with MRI/MRA in 2007 WNL except decreased caliber MCA proximally, distal cavernous portion of left LCA which may represent true stenosis or technique on exam. Evaluated by Dr Linus Salmons.   Palpitations    Partial seizures (HCC)    questionable diag   Pneumonia    walking pneumonia   Polycystic ovary    multiple ovarian cysts  removed   S/P cervical discectomy    Dr Vertell Limber, anterior discectomy C5-C7   S/P epidural steroid injection    Right hip   Seizures ([place]) 2005   simple partial seizure disorder-was mast cell problems per pt 09-15-2019- no seizures per pt     SURGICAL HISTORY: Past Surgical History:  Procedure Laterality Date   APPENDECTOMY  1986   BREAST BIOPSY Right 09/2019   BUNIONECTOMY Left    bunion removal   CERVICAL DISC SURGERY  2005   C5-C7    COLONOSCOPY     core breast biopsy      DENTAL SURGERY     fallopian tue removed     LAPAROSCOPIC BILATERAL SALPINGECTOMY Left 02/09/2016   Procedure: LAPAROSCOPIC LEFT  SALPINGECTOMY;  Surgeon: Janyth Contes, MD;  Location: [facility];  Service: Gynecology;  Laterality: Left;   LAPAROSCOPIC LYSIS OF ADHESIONS  02/09/2016   Procedure: LAPAROSCOPIC LYSIS OF ADHESIONS;  Surgeon: Janyth Contes, MD;  Location: [facility];  Service: Gynecology;;  momentum to adenexa   LAPAROSCOPIC OVARIAN CYSTECTOMY Right 02/09/2016   Procedure: LAPAROSCOPIC OVARIAN CYSTECTOMY;  Surgeon: Janyth Contes, MD;  Location: [facility];  Service: Gynecology;  Laterality: Right;  x2 to right ovary   LEEP  1998   OOPHORECTOMY Left 02/09/2016   Procedure: LEFT OOPHORECTOMY;  Surgeon: Janyth Contes, MD;  Location: [facility];  Service: Gynecology;  Laterality: Left;- pt states left ovary has NOT been removed    POLYPECTOMY     TONSILLECTOMY  2001   UPPER GASTROINTESTINAL ENDOSCOPY     UPPER GI ENDOSCOPY      SOCIAL HISTORY: Social History   Socioeconomic History   Marital status: Married    Spouse name: Not on file   Number of children: 0   Years of education: Not on file   Highest education level: Not on file  Occupational History   Occupation: novelist    Comment: has been accepted into [employer]..   Occupation: Pharmacist, hospital   Occupation: Curator  Tobacco Use   Smoking status: Former    Packs/day: 0.00    Years: 0.00    Additional pack years: 0.00    Total pack years: 0.00    Types: Cigarettes    Quit date: 11/02/2015    Years since quitting: 6.6   Smokeless tobacco: Never   Tobacco comments:    currently smoking, began at beginning of June and plans to quit on June 14th. Has smoked off and on in the past.  Vaping Use   Vaping Use: Every day  Substance and Sexual Activity   Alcohol use: Yes    Alcohol/week: 1.0 - 2.0 standard drink of alcohol    Types: 1 - 2 Standard  drinks or equivalent per week    Comment: occasionally 0-2 per day- rare    Drug use: No   Sexual activity: Yes  Other Topics Concern   Not on file  Social History Narrative   Not on file   Social Determinants of Health   Financial Resource Strain: Not on file  Food Insecurity: Not on file  Transportation Needs: Not on file  Physical Activity: Not on file  Stress: Not on file  Social Connections: Not on file  Intimate Partner Violence: Not on file    FAMILY HISTORY: Family History  Adopted: Yes  Problem Relation Age of Onset   COPD Mother    Breast cancer Mother    Other Mother  Teeth problems- all removed by age 60   Rashes / Skin problems Sister    Other Sister        Teeth problems- all removed by age 58   Rashes / Skin problems Brother    Asthma Brother    Other Brother        Teeth problems- all removed by age 18   Breast cancer Maternal Aunt    Adrenal disorder Neg Hx    Colon cancer Neg Hx    Colon polyps Neg Hx    Esophageal cancer Neg Hx    Stomach cancer Neg Hx    Rectal cancer Neg Hx     ALLERGIES:  is allergic to clindamycin/lincomycin, codeine, cyclobenzaprine, gabapentin, nyquil multi-symptom [pseudoeph-doxylamine-dm-apap], uribel [meth-hyo-m bl-na phos-ph sal], xanax [alprazolam], and urelle.  MEDICATIONS:  Current Outpatient Medications  Medication Sig Dispense Refill   Ascorbic Acid (VITAMIN C) 100 MG tablet Take 100 mg by mouth daily.     clonazePAM (KLONOPIN) 0.5 MG tablet Take 1 tablet (0.5 mg total) by mouth 2 (two) times daily as needed for anxiety. 20 tablet 1   Copper Gluconate (COPPER CAPS) 2 MG CAPS Take 2 mg by mouth as directed. 80 capsule 0   SPRYCEL 50 MG tablet TAKE 1 TABLET (50 MG TOTAL) BY MOUTH DAILY. 30 tablet 1   No current facility-administered medications for this visit.    REVIEW OF SYSTEMS:   10 Point review of Systems was done is negative except as noted above.  PHYSICAL EXAMINATION: TELE-MED  VISIT   LABORATORY DATA:  I have reviewed the data as listed      Latest Ref Rng & Units 06/19/2022   11:11 AM 05/17/2022   10:26 AM 05/08/2022    1:00 PM  CBC  WBC 4.0 - 10.5 K/uL 9.5  9.7  8.8   Hemoglobin 12.0 - 15.0 g/dL 13.0  14.3  13.5   Hematocrit 36.0 - 46.0 % 37.7  40.5  39.1   Platelets 150 - 400 K/uL 308  324  285     CBC    Component Value Date/Time   WBC 9.5 06/19/2022 1111   WBC 8.4 07/20/2021 1559   RBC 4.30 06/19/2022 1111   HGB 13.0 06/19/2022 1111   HGB 15.1 03/26/2017 1101   HCT 37.7 06/19/2022 1111   HCT 44.0 03/26/2017 1101   PLT 308 06/19/2022 1111   PLT 330 03/26/2017 1101   MCV 87.7 06/19/2022 1111   MCV 84 03/26/2017 1101   MCH 30.2 06/19/2022 1111   MCHC 34.5 06/19/2022 1111   RDW 12.7 06/19/2022 1111   RDW 14.0 03/26/2017 1101   LYMPHSABS 3.5 06/19/2022 1111   LYMPHSABS 2.8 03/26/2017 1101   MONOABS 0.6 06/19/2022 1111   EOSABS 0.5 06/19/2022 1111   EOSABS 0.4 03/26/2017 1101   BASOSABS 0.1 06/19/2022 1111   BASOSABS 0.2 03/26/2017 1101     .    Latest Ref Rng & Units 06/19/2022   11:11 AM 05/17/2022   10:26 AM 05/08/2022    1:00 PM  CMP  Glucose 70 - 99 mg/dL 100  92  99   BUN 6 - 20 mg/dL 11  11  10    Creatinine 0.44 - 1.00 mg/dL 0.87  0.92  0.86   Sodium 135 - 145 mmol/L 139  140  140   Potassium 3.5 - 5.1 mmol/L 4.1  4.3  4.2   Chloride 98 - 111 mmol/L 105  105  105   CO2 22 -  32 mmol/L 29  32  31   Calcium 8.9 - 10.3 mg/dL 9.4  9.7  9.3   Total Protein 6.5 - 8.1 g/dL 6.9  7.0  6.3   Total Bilirubin 0.3 - 1.2 mg/dL 0.4  0.4  0.3   Alkaline Phos 38 - 126 U/L 70  80  76   AST 15 - 41 U/L 29  19  19    ALT 0 - 44 U/L 42  22  21     05/16/18 Cytogenetics:   04/10/18 Hemochromatosis Panel:    RADIOGRAPHIC STUDIES: I have personally reviewed the radiological images as listed and agreed with the findings in the report. ECHOCARDIOGRAM COMPLETE  Result Date: 05/29/2022    ECHOCARDIOGRAM REPORT   Patient Name:   [facility] Date of Exam: 05/29/2022 Medical Rec #:  [id number]               Height:       66.0 in Accession #:    [id number]              Weight:       189.0 lb Date of Birth:  02-Jun-1970               BSA:          1.952 m Patient Age:    37 years                BP:           127/79 mmHg Patient Gender: F                       HR:           89 bpm. Exam Location:  Outpatient Procedure: 2D Echo, 3D Echo, Color Doppler, Cardiac Doppler and Strain Analysis Indications:    Chemo  History:        Patient has prior history of Echocardiogram examinations, most                 recent 08/03/2017. CML (chronic myelocytic leukemia).  Sonographer:    Eartha Inch Referring Phys: [id number] Brunetta Genera  Sonographer Comments: Image acquisition challenging due to patient body habitus and Image acquisition challenging due to respiratory motion. Global longitudinal strain was attempted. IMPRESSIONS  1. Left ventricular ejection fraction, by estimation, is 60 to 65%. The left ventricle has normal function. The left ventricle has no regional wall motion abnormalities. Left ventricular diastolic parameters are consistent with Grade I diastolic dysfunction (impaired relaxation). The average left ventricular global longitudinal strain is -19.5 %. The global longitudinal strain is normal.  2. Right ventricular systolic function is normal. The right ventricular size is normal. Tricuspid regurgitation signal is inadequate for assessing PA pressure.  3. The mitral valve is normal in structure. No evidence of mitral valve regurgitation. No evidence of mitral stenosis.  4. The aortic valve is tricuspid. Aortic valve regurgitation is not visualized.  5. The inferior vena cava is dilated in size with >50% respiratory variability, suggesting right atrial pressure of 8 mmHg. Comparison(s): Unable to view 2019 study. FINDINGS  Left Ventricle: Left ventricular ejection fraction, by estimation, is 60 to 65%. The left ventricle has normal  function. The left ventricle has no regional wall motion abnormalities. The average left ventricular global longitudinal strain is -19.5 %. The global longitudinal strain is normal. The left ventricular internal cavity size was normal in size. There is no left ventricular hypertrophy. Left  ventricular diastolic parameters are consistent with Grade I diastolic dysfunction (impaired relaxation). Right Ventricle: The right ventricular size is normal. No increase in right ventricular wall thickness. Right ventricular systolic function is normal. Tricuspid regurgitation signal is inadequate for assessing PA pressure. Left Atrium: Left atrial size was normal in size. Right Atrium: Right atrial size was normal in size. Pericardium: Trivial pericardial effusion is present. The pericardial effusion is circumferential. Presence of epicardial fat layer. Mitral Valve: The mitral valve is normal in structure. No evidence of mitral valve regurgitation. No evidence of mitral valve stenosis. MV peak gradient, 2.1 mmHg. The mean mitral valve gradient is 1.0 mmHg. Tricuspid Valve: The tricuspid valve is normal in structure. Tricuspid valve regurgitation is not demonstrated. No evidence of tricuspid stenosis. Aortic Valve: The aortic valve is tricuspid. Aortic valve regurgitation is not visualized. Pulmonic Valve: The pulmonic valve was normal in structure. Pulmonic valve regurgitation is not visualized. No evidence of pulmonic stenosis. Aorta: The aortic root and ascending aorta are structurally normal, with no evidence of dilitation. Venous: The inferior vena cava is dilated in size with greater than 50% respiratory variability, suggesting right atrial pressure of 8 mmHg. IAS/Shunts: No atrial level shunt detected by color flow Doppler.  LEFT VENTRICLE PLAX 2D LVIDd:         4.80 cm     Diastology LVIDs:         4.00 cm     LV e' medial:    7.94 cm/s LV PW:         0.80 cm     LV E/e' medial:  7.5 LV IVS:        0.70 cm     LV e'  lateral:   11.10 cm/s LVOT diam:     2.10 cm     LV E/e' lateral: 5.4 LV SV:         74 LV SV Index:   38          2D Longitudinal Strain LVOT Area:     3.46 cm    2D Strain GLS Avg:     -19.5 %  LV Volumes (MOD) LV vol d, MOD A2C: 66.8 ml LV vol d, MOD A4C: 70.3 ml LV vol s, MOD A2C: 24.0 ml LV vol s, MOD A4C: 27.9 ml LV SV MOD A2C:     42.8 ml LV SV MOD A4C:     70.3 ml LV SV MOD BP:      42.8 ml RIGHT VENTRICLE             IVC RV S prime:     12.50 cm/s  IVC diam: 2.10 cm TAPSE (M-mode): 2.2 cm LEFT ATRIUM             Index        RIGHT ATRIUM          Index LA diam:        3.10 cm 1.59 cm/m   RA Area:     9.61 cm LA Vol (A2C):   30.9 ml 15.83 ml/m  RA Volume:   17.40 ml 8.91 ml/m LA Vol (A4C):   25.2 ml 12.91 ml/m LA Biplane Vol: 30.6 ml 15.67 ml/m  AORTIC VALVE LVOT Vmax:   103.00 cm/s LVOT Vmean:  78.300 cm/s LVOT VTI:    0.214 m  AORTA Ao Root diam: 3.10 cm Ao Asc diam:  2.90 cm MITRAL VALVE MV Area (PHT): 2.83 cm    SHUNTS MV Area VTI:  4.75 cm    Systemic VTI:  0.21 m MV Peak grad:  2.1 mmHg    Systemic Diam: 2.10 cm MV Mean grad:  1.0 mmHg MV Vmax:       0.72 m/s MV Vmean:      54.8 cm/s MV Decel Time: 268 msec MV E velocity: 59.60 cm/s MV A velocity: 74.60 cm/s MV E/A ratio:  0.80 Rudean Haskell MD Electronically signed by Rudean Haskell MD Signature Date/Time: 05/29/2022/1:35:23 PM    Final    DG Chest 2 View  Result Date: 05/29/2022 CLINICAL DATA:  52 year old female with shortness of breath EXAM: CHEST - 2 VIEW COMPARISON:  07/28/2019 FINDINGS: Cardiomediastinal silhouette unchanged in size and contour. No evidence of central vascular congestion. No interlobular septal thickening. No pneumothorax or pleural effusion. Coarsened interstitial markings, with no confluent airspace disease. No acute displaced fracture. Degenerative changes of the spine. Surgical changes of the cervical region incompletely imaged IMPRESSION: No active cardiopulmonary disease. Electronically Signed    By: Corrie Mckusick D.O.   On: 05/29/2022 09:55    01/31/18 ECHO Transthoracic:     ASSESSMENT & PLAN:   52 y.o. female with  1. CML- Chronic phase 05/16/18 BM Cytogenetics revealed translocation 9;22. FISH report revealed BCR-ABL gene rearrangement present in 86.5% of cells 06/03/18 FISH revealed BCR-ABL gene rearrangement present in 85.67% of cells, with some hypercellularity around 80% 06/05/18 US Abdomen revealed normal spleen size 01/31/18 ECHO revealed LV EF of 63%, no regional wall motion abnormalities, no significant valvular hear disease  Initial pre-treatment 07/25/18 BCR-ABL revealed IS at 71.1362%  08/28/18 EKG reviewed  2. 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 Mild copper deficiency- replaced.  PLAN: -Discussed lab results from 06/19/2022 with the patient. CBC is stable. CMP is stable.  -Discussed Chest X-Ray and ECHO results which did not show any abnormalities.  -Recommended Tylenol up to 1 gram a day as needed for joint pain.  -Patient wants to continue Sprycel for now and she if her symptoms improve before changing medication or lowering her dose.  -Discussed the option to refer her to cardiologist regarding skipping heartbeat and dizziness. Patient agrees.  -Discussed the option to refer the patient to Physical Therapy to improve her joint pain. She does not want the referral for now.  -Discussed that the patient can start her antihistamines (she believes she might have a mast cell stabilization disorder in addition to CML -- not proven and that was what was causing this symptoms which were mitigated by her antihistamines) -Answered all of patient's questions regarding her CML, H2 blockers, symptoms, and her current dose of Sprycel.   FOLLOW-UP: -Cardiology referral for dizziness -labs in 2 weeks Phone visit with Dr Irene Limbo in 4 weeks with labs the day prior    The total time spent in the appointment was 21  minutes* .  All of the patient's questions were answered with apparent satisfaction. The patient knows to call the clinic with any problems, questions or concerns.   Sullivan Lone MD MS AAHIVMS [facility] Hematology/Oncology Physician [facility]  .*Total Encounter Time as defined by the Centers for Medicare and Medicaid Services includes, in addition to the face-to-face time of a patient visit (documented in the note above) non-face-to-face time: obtaining and reviewing outside history, ordering and reviewing medications, tests or procedures, care coordination (communications with other health care professionals or caregivers) and documentation in the medical record.   I, Cleda Mccreedy,  am acting as a Education administrator for Sullivan Lone, MD. .I have reviewed the above documentation for accuracy and completeness, and I agree with the above. Brunetta Genera MD

## 2022-06-21 ENCOUNTER — Telehealth: Payer: Self-pay | Admitting: Hematology

## 2022-06-21 ENCOUNTER — Ambulatory Visit (HOSPITAL_BASED_OUTPATIENT_CLINIC_OR_DEPARTMENT_OTHER)
Admission: RE | Admit: 2022-06-21 | Discharge: 2022-06-21 | Disposition: A | Discharge status: Home / Self Care | Payer: 59 | Source: Ambulatory Visit | Attending: Family Medicine | Admitting: Family Medicine

## 2022-06-21 ENCOUNTER — Encounter: Payer: Self-pay | Admitting: Family Medicine

## 2022-06-21 ENCOUNTER — Ambulatory Visit: Payer: 59 | Admitting: Family Medicine

## 2022-06-21 VITALS — BP 118/72 | HR 93 | Temp 97.8°F | Resp 18 | Ht 66.0 in | Wt 187.6 lb

## 2022-06-21 DIAGNOSIS — M542 Cervicalgia: Secondary | ICD-10-CM | POA: Insufficient documentation

## 2022-06-21 DIAGNOSIS — M79671 Pain in right foot: Secondary | ICD-10-CM | POA: Insufficient documentation

## 2022-06-21 DIAGNOSIS — F419 Anxiety disorder, unspecified: Secondary | ICD-10-CM

## 2022-06-21 MED ORDER — CLONAZEPAM 0.5 MG PO TABS: 0.5000 mg | ORAL_TABLET | Freq: Two times a day (BID) | ORAL | 1 refills | Status: DC | PRN

## 2022-06-21 NOTE — Telephone Encounter (Signed)
Called patient again per 3/20 secure chat to correct f/u appointments. Left voicemail with direct contact information for patient to call us back.

## 2022-06-21 NOTE — Telephone Encounter (Signed)
Called patient per 3/19 los notes to schedule f/u. Left voicemail with new appointment information and contact details if needing to reschedule.

## 2022-06-21 NOTE — Patient Instructions (Addendum)
Good to see you today- we will get x-rays of your neck and your right foot I will be in touch with these reports

## 2022-06-23 ENCOUNTER — Other Ambulatory Visit: Payer: Self-pay

## 2022-06-23 DIAGNOSIS — C9210 Chronic myeloid leukemia, BCR/ABL-positive, not having achieved remission: Secondary | ICD-10-CM

## 2022-06-25 ENCOUNTER — Encounter: Payer: Self-pay | Admitting: Family Medicine

## 2022-06-26 ENCOUNTER — Ambulatory Visit (HOSPITAL_BASED_OUTPATIENT_CLINIC_OR_DEPARTMENT_OTHER)
Admission: RE | Admit: 2022-06-26 | Discharge: 2022-06-26 | Disposition: A | Discharge status: Home / Self Care | Payer: 59 | Source: Ambulatory Visit | Attending: Family Medicine | Admitting: Family Medicine

## 2022-06-26 ENCOUNTER — Encounter: Payer: Self-pay | Admitting: Family

## 2022-06-26 DIAGNOSIS — R198 Other specified symptoms and signs involving the digestive system and abdomen: Secondary | ICD-10-CM | POA: Insufficient documentation

## 2022-06-27 ENCOUNTER — Encounter: Payer: Self-pay | Admitting: Family Medicine

## 2022-06-27 NOTE — Telephone Encounter (Signed)
Called Radiology reading room on 06/26/2022 to have Foot Xray read and replace current report (C-Spine).  Radiology rep stated she would have foot xray read and updated.   Called Radiology reading room today regarding same since not updated.  Rep states it was flagged for the original radiologist to read the foot xray and update the report when he came back to work. However, original radiologist is not scheduled to work this week.  Radiology rep stated she would assign the read and update to a different Radiologist to complete.

## 2022-06-29 ENCOUNTER — Encounter: Payer: Self-pay | Admitting: Family Medicine

## 2022-06-30 ENCOUNTER — Other Ambulatory Visit: Payer: Self-pay

## 2022-06-30 DIAGNOSIS — C9210 Chronic myeloid leukemia, BCR/ABL-positive, not having achieved remission: Secondary | ICD-10-CM

## 2022-07-03 ENCOUNTER — Inpatient Hospital Stay: Payer: 59 | Attending: Family

## 2022-07-03 ENCOUNTER — Other Ambulatory Visit: Payer: Self-pay

## 2022-07-03 DIAGNOSIS — R21 Rash and other nonspecific skin eruption: Secondary | ICD-10-CM | POA: Insufficient documentation

## 2022-07-03 DIAGNOSIS — C9210 Chronic myeloid leukemia, BCR/ABL-positive, not having achieved remission: Secondary | ICD-10-CM

## 2022-07-03 LAB — CMP (CANCER CENTER ONLY)
ALT: 28 U/L (ref 0–44)
AST: 20 U/L (ref 15–41)
Albumin: 4.2 g/dL (ref 3.5–5.0)
Alkaline Phosphatase: 79 U/L (ref 38–126)
Anion gap: 6 (ref 5–15)
BUN: 10 mg/dL (ref 6–20)
CO2: 28 mmol/L (ref 22–32)
Calcium: 9.8 mg/dL (ref 8.9–10.3)
Chloride: 106 mmol/L (ref 98–111)
Creatinine: 0.83 mg/dL (ref 0.44–1.00)
GFR, Estimated: >60 mL/min (ref >60)
Glucose, Bld: 106 mg/dL — ABNORMAL HIGH (ref 70–99)
Potassium: 3.9 mmol/L (ref 3.5–5.1)
Sodium: 140 mmol/L (ref 135–145)
Total Bilirubin: 0.4 mg/dL (ref 0.3–1.2)
Total Protein: 7 g/dL (ref 6.5–8.1)

## 2022-07-03 LAB — CBC WITH DIFFERENTIAL (CANCER CENTER ONLY)
Abs Immature Granulocytes: 0.01 10*3/uL (ref 0.00–0.07)
Basophils Absolute: 0.1 10*3/uL (ref 0.0–0.1)
Basophils Relative: 1 %
Eosinophils Absolute: 0.4 10*3/uL (ref 0.0–0.5)
Eosinophils Relative: 5 %
HCT: 37.8 % (ref 36.0–46.0)
Hemoglobin: 13.1 g/dL (ref 12.0–15.0)
Immature Granulocytes: 0 %
Lymphocytes Relative: 37 %
Lymphs Abs: 3.2 10*3/uL (ref 0.7–4.0)
MCH: 30.2 pg (ref 26.0–34.0)
MCHC: 34.7 g/dL (ref 30.0–36.0)
MCV: 87.1 fL (ref 80.0–100.0)
Monocytes Absolute: 0.7 10*3/uL (ref 0.1–1.0)
Monocytes Relative: 8 %
Neutro Abs: 4.4 10*3/uL (ref 1.7–7.7)
Neutrophils Relative %: 49 %
Platelet Count: 314 10*3/uL (ref 150–400)
RBC: 4.34 MIL/uL (ref 3.87–5.11)
RDW: 12.5 % (ref 11.5–15.5)
WBC Count: 8.8 10*3/uL (ref 4.0–10.5)
nRBC: 0 % (ref 0.0–0.2)

## 2022-07-03 LAB — MAGNESIUM: Magnesium: 1.9 mg/dL (ref 1.7–2.4)

## 2022-07-07 ENCOUNTER — Other Ambulatory Visit: Payer: Self-pay | Admitting: Hematology

## 2022-07-07 DIAGNOSIS — C9210 Chronic myeloid leukemia, BCR/ABL-positive, not having achieved remission: Secondary | ICD-10-CM

## 2022-07-10 ENCOUNTER — Telehealth: Payer: Self-pay

## 2022-07-10 NOTE — Telephone Encounter (Signed)
Returned call to pt. Pt states she developed systemic rash last week, rash on her face, scalp, arms and legs. Pt stopped sprycel on Thursday and rash has slowly gotten better. Pt will hold sprycel for now and is requesting to try a lower dose. Will discuss with Dr Candise Che and let pt know.

## 2022-07-13 NOTE — Progress Notes (Signed)
Symptom Management Consult Note [facility]    Patient Care Team: Copland, Gwenlyn Found, MD as PCP - General (Family Medicine)    Name / MRN / DOB: Victoria Hall  [id number]  Aug 10, 1970   Date of visit: 07/14/2022   Chief Complaint/Reason for visit: rash   Current Therapy: Sprycell   ASSESSMENT & PLAN: Patient is a 52 y.o. female  with oncologic history of CML followed by Dr. Candise Che.  I have viewed most recent oncology note and lab work.    #CML - Per onc visit 05/17/22 pt switched to low dose Sprycel 50 mg daily from Imatinib - Next appointment with oncologist is 08/01/22   #Rash -Nearly resolved. Based on her description of rash it was grade 3-4. - No indication for systemic or topical steroids at this time.  - Patient will continue to hold Sprycell until her next oncology appointment when treatment options can be discussed. - Dr. Candise Che is agreeable with plan.    Heme/Onc History: Oncology History   No history exists.      Interval history-: Victoria Hall is a 52 y.o. female with oncologic history of CML presenting to [facility] today with chief complaint of rash.  Patient states the rash started x 11 days ago.  She first noticed it as hives across her back.  She had associated pruritus.  In the next several days it spread to her neck, face, scalp, legs and abdomen.  She discontinued Sprycell 3 days after the rash started.  Her last dose was on 07/06/22.  She states when she stopped the medication the rash started to immediately improve.  She takes daily Claritin and restarted famotidine.  When the rash was present she denied any fever or chills.  Denies any other new medications.  No one in the home had similar rash.  No recent travel.     ROS  All other systems are reviewed and are negative for acute change except as noted in the HPI.    Allergies  Allergen Reactions   Clindamycin/Lincomycin Itching and Other (See Comments)    Dizziness,  trouble swallowing   Codeine Itching and Nausea And Vomiting   Cyclobenzaprine Other (See Comments)    Fast heartbeat, restless leg, nightmares   Gabapentin Other (See Comments)    Headaches, visual disturbances.    Nyquil Multi-Symptom [Pseudoeph-Doxylamine-Dm-Apap] Other (See Comments)    Heart racing, dizziness, weakness   Uribel [Meth-Hyo-M Bl-Na Phos-Ph Sal] Other (See Comments)    Heart racing, dizziness, blurry vision.   Xanax [Alprazolam]     Bruising   Urelle Nausea And Vomiting     Past Medical History:  Diagnosis Date   Anxiety    Past hx- [facility] due to Low BP    Arthritis    Asthma    early teens only    Cervical intraepithelial neoplasia grade 3 2002   s/p LEEP Rx repeat pap negative   Cervical radiculopathy    CML (chronic myelocytic leukemia) (HCC) 06/03/2018   Fallopian tube disorder    right fallopian tube removed   Gallstones 09/2015   On CT   Headache    Migraines   Hemorrhagic ovarian cyst 02/08/2016   Hydrosalpinx    [facility]   Hydrosalpinx 02/08/2016   Interstitial cystitis    Neurological abnormality    Neg workup with MRI/MRA in 2007 WNL except decreased caliber MCA proximally, distal cavernous portion of left LCA which may represent true  stenosis or technique on exam. Evaluated by Dr Luberta Robertson.   Palpitations    Partial seizures (HCC)    questionable diag   Pneumonia    walking pneumonia   Polycystic ovary    multiple ovarian cysts removed   S/P cervical discectomy    Dr Venetia Maxon, anterior discectomy C5-C7   S/P epidural steroid injection    Right hip   Seizures (HCC) 2005   simple partial seizure disorder-was mast cell problems per pt 09-15-2019- no seizures per pt      Past Surgical History:  Procedure Laterality Date   APPENDECTOMY  1986   BREAST BIOPSY Right 09/2019   BUNIONECTOMY Left    bunion removal   CERVICAL DISC SURGERY  2005   C5-C7   COLONOSCOPY     core breast biopsy      DENTAL SURGERY      fallopian tue removed     LAPAROSCOPIC BILATERAL SALPINGECTOMY Left 02/09/2016   Procedure: LAPAROSCOPIC LEFT  SALPINGECTOMY;  Surgeon: Sherian Rein, MD;  Location: [facility];  Service: Gynecology;  Laterality: Left;   LAPAROSCOPIC LYSIS OF ADHESIONS  02/09/2016   Procedure: LAPAROSCOPIC LYSIS OF ADHESIONS;  Surgeon: Sherian Rein, MD;  Location: [facility];  Service: Gynecology;;  momentum to adenexa   LAPAROSCOPIC OVARIAN CYSTECTOMY Right 02/09/2016   Procedure: LAPAROSCOPIC OVARIAN CYSTECTOMY;  Surgeon: Sherian Rein, MD;  Location: [facility];  Service: Gynecology;  Laterality: Right;  x2 to right ovary   LEEP  1998   OOPHORECTOMY Left 02/09/2016   Procedure: LEFT OOPHORECTOMY;  Surgeon: Sherian Rein, MD;  Location: [facility];  Service: Gynecology;  Laterality: Left;- pt states left ovary has NOT been removed    POLYPECTOMY     TONSILLECTOMY  2001   UPPER GASTROINTESTINAL ENDOSCOPY     UPPER GI ENDOSCOPY      Social History   Socioeconomic History   Marital status: Married    Spouse name: Not on file   Number of children: 0   Years of education: Not on file   Highest education level: Not on file  Occupational History   Occupation: novelist    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   Occupation: Runner, broadcasting/film/video   Occupation: Education administrator  Tobacco Use   Smoking status: Former    Packs/day: 0.00    Years: 0.00    Additional pack years: 0.00    Total pack years: 0.00    Types: Cigarettes    Quit date: 11/02/2015    Years since quitting: 6.7   Smokeless tobacco: Never   Tobacco comments:    currently smoking, began at beginning of June and plans to quit on June 14th. Has smoked off and on in the past.  Vaping Use   Vaping Use: Every day  Substance and Sexual Activity   Alcohol use: Yes    Alcohol/week: 1.0 - 2.0 standard drink of alcohol    Types: 1 - 2 Standard drinks or equivalent per week    Comment: occasionally 0-2 per day- rare     Drug use: No   Sexual activity: Yes  Other Topics Concern   Not on file  Social History Narrative   Not on file   Social Determinants of Health   Financial Resource Strain: Patient Declined (06/21/2022)   Overall Financial Resource Strain (CARDIA)    Difficulty of Paying Living Expenses: Patient declined  Food Insecurity: Patient Declined (06/21/2022)   Hunger Vital Sign    Worried About Running Out of  Food in the Last Year: Patient declined    Ran Out of Food in the Last Year: Patient declined  Transportation Needs: Patient Declined (06/21/2022)   PRAPARE - Administrator, Civil ServiceTransportation    Lack of Transportation (Medical): Patient declined    Lack of Transportation (Non-Medical): Patient declined  Physical Activity: Unknown (06/21/2022)   Exercise Vital Sign    Days of Exercise per Week: Patient declined    Minutes of Exercise per Session: Not on file  Stress: No Stress Concern Present (06/21/2022)   [employer] - Occupational Stress Questionnaire    Feeling of Stress : Not at all  Social Connections: Unknown (06/21/2022)   Social Connection and Isolation Panel [NHANES]    Frequency of Communication with Friends and Family: Patient declined    Frequency of Social Gatherings with Friends and Family: Patient declined    Attends Religious Services: Patient declined    Database administratorActive Member of Clubs or Organizations: Patient declined    Attends Engineer, structuralClub or Organization Meetings: Not on file    Marital Status: Patient declined  Catering managerntimate Partner Violence: Not on file    Family History  Adopted: Yes  Problem Relation Age of Onset   COPD Mother    Breast cancer Mother    Other Mother        Teeth problems- all removed by age 53   Rashes / Skin problems Sister    Other Sister        Teeth problems- all removed by age 10730   Rashes / Skin problems Brother    Asthma Brother    Other Brother        Teeth problems- all removed by age 53   Breast cancer Maternal Aunt    Adrenal  disorder Neg Hx    Colon cancer Neg Hx    Colon polyps Neg Hx    Esophageal cancer Neg Hx    Stomach cancer Neg Hx    Rectal cancer Neg Hx      Current Outpatient Medications:    Ascorbic Acid (VITAMIN C) 100 MG tablet, Take 100 mg by mouth daily., Disp: , Rfl:    clonazePAM (KLONOPIN) 0.5 MG tablet, Take 1 tablet (0.5 mg total) by mouth 2 (two) times daily as needed for anxiety., Disp: 30 tablet, Rfl: 1   SPRYCEL 50 MG tablet, TAKE 1 TABLET BY MOUTH 1 TIME A DAY, Disp: 30 tablet, Rfl: 1  PHYSICAL EXAM: ECOG FS:1 - Symptomatic but completely ambulatory    Vitals:   07/14/22 1259  BP: (!) 148/87  Pulse: 88  Resp: 20  Temp: 99.1 F (37.3 C)  TempSrc: Oral  SpO2: 99%  Weight: 181 lb 11.2 oz (82.4 kg)   Physical Exam Vitals and nursing note reviewed.  Constitutional:      Appearance: She is not ill-appearing or toxic-appearing.  HENT:     Head: Normocephalic.  Eyes:     Conjunctiva/sclera: Conjunctivae normal.  Cardiovascular:     Rate and Rhythm: Normal rate.  Pulmonary:     Effort: Pulmonary effort is normal.  Abdominal:     General: There is no distension.  Musculoskeletal:     Cervical back: Normal range of motion.  Skin:    General: Skin is warm and dry.     Findings: Rash present.     Comments: <10 macular lesions on torso  Neurological:     Mental Status: She is alert.        LABORATORY DATA: I have reviewed the  data as listed    Latest Ref Rng & Units 07/03/2022   11:02 AM 06/19/2022   11:11 AM 05/17/2022   10:26 AM  CBC  WBC 4.0 - 10.5 K/uL 8.8  9.5  9.7   Hemoglobin 12.0 - 15.0 g/dL 52.8  41.3  24.4   Hematocrit 36.0 - 46.0 % 37.8  37.7  40.5   Platelets 150 - 400 K/uL 314  308  324         Latest Ref Rng & Units 07/03/2022   11:02 AM 06/19/2022   11:11 AM 05/17/2022   10:26 AM  CMP  Glucose 70 - 99 mg/dL 010  272  92   BUN 6 - 20 mg/dL 10  11  11    Creatinine 0.44 - 1.00 mg/dL 5.36  6.44  0.34   Sodium 135 - 145 mmol/L 140  139  140    Potassium 3.5 - 5.1 mmol/L 3.9  4.1  4.3   Chloride 98 - 111 mmol/L 106  105  105   CO2 22 - 32 mmol/L 28  29  32   Calcium 8.9 - 10.3 mg/dL 9.8  9.4  9.7   Total Protein 6.5 - 8.1 g/dL 7.0  6.9  7.0   Total Bilirubin 0.3 - 1.2 mg/dL 0.4  0.4  0.4   Alkaline Phos 38 - 126 U/L 79  70  80   AST 15 - 41 U/L 20  29  19    ALT 0 - 44 U/L 28  42  22        RADIOGRAPHIC STUDIES (from last 24 hours if applicable) I have personally reviewed the radiological images as listed and agreed with the findings in the report. No results found.      Visit Diagnosis: 1. CML (chronic myelocytic leukemia)   2. Rash      No orders of the defined types were placed in this encounter.   All questions were answered. The patient knows to call the clinic with any problems, questions or concerns. No barriers to learning was detected.  I have spent a total of 10 minutes minutes of face-to-face and non-face-to-face time, preparing to see the patient, obtaining and/or reviewing separately obtained history, performing a medically appropriate examination, counseling and educating the patient, ordering tests, documenting clinical information in the electronic health record, and care coordination (communications with other health care professionals or caregivers).    Thank you for allowing me to participate in the care of this patient.    Shanon Ace, PA-C Department of Hematology/Oncology [facility] Phone: [phone]  [phone]    07/14/2022 3:28 PM

## 2022-07-14 ENCOUNTER — Inpatient Hospital Stay (HOSPITAL_BASED_OUTPATIENT_CLINIC_OR_DEPARTMENT_OTHER): Payer: 59 | Admitting: Physician Assistant

## 2022-07-14 ENCOUNTER — Other Ambulatory Visit: Payer: Self-pay

## 2022-07-14 VITALS — BP 148/87 | HR 88 | Temp 99.1°F | Resp 20 | Wt 181.7 lb

## 2022-07-14 DIAGNOSIS — C9210 Chronic myeloid leukemia, BCR/ABL-positive, not having achieved remission: Secondary | ICD-10-CM

## 2022-07-14 DIAGNOSIS — R21 Rash and other nonspecific skin eruption: Secondary | ICD-10-CM | POA: Diagnosis not present

## 2022-07-17 ENCOUNTER — Other Ambulatory Visit: Payer: 59

## 2022-07-18 ENCOUNTER — Telehealth: Payer: Self-pay | Admitting: Family Medicine

## 2022-07-18 NOTE — Telephone Encounter (Signed)
Spoke w/ Pt- she went ahead and went to [facility]. She is going to go ahead w/ booster. Pt knows dog and is up to date on immunizations until 2025.

## 2022-07-18 NOTE — Telephone Encounter (Signed)
Pt called to see if she needs to get a tetanus shot. She is puppy sitting and and she got bit by the puppy and has a puncture wound on her thumb. Please call to advise.

## 2022-07-18 NOTE — Telephone Encounter (Signed)
Please advise 

## 2022-07-18 NOTE — Telephone Encounter (Signed)
Her last shot was 2016, so she is still covered but could get a booster now if she likes.  Please check with her no risk of rabies- confirm this is a healthy pet dog who is vaccinated, not a stray.  Thank you!

## 2022-07-21 ENCOUNTER — Encounter: Payer: Self-pay | Admitting: Family Medicine

## 2022-07-21 ENCOUNTER — Ambulatory Visit: Payer: 59 | Admitting: Family Medicine

## 2022-07-21 VITALS — BP 118/60 | HR 100 | Temp 98.2°F | Ht 66.0 in | Wt 180.0 lb

## 2022-07-21 DIAGNOSIS — S90414A Abrasion, right lesser toe(s), initial encounter: Secondary | ICD-10-CM

## 2022-07-21 NOTE — Progress Notes (Signed)
   Acute Office Visit  Subjective:     Patient ID: Victoria Hall, female    DOB: 18-Jun-1970, 52 y.o.   MRN: [id number]  Chief Complaint  Patient presents with   Toe Injury    HPI Patient is in today for toe injury.   About a week ago she brushed her feet against some holly bush branches and thought it was just a scratch, but has noticed the area seems to be opening up a little bit and looking a little red. It is tender when touched, but otherwise not painful. She denies any spreading erythema, edema, warmth, drainage.   She started Augmentin three days ago for a dog bite to her hand. Td is up to date.     ROS All review of systems negative except what is listed in the HPI      Objective:    BP 118/60   Pulse 100   Temp 98.2 F (36.8 C) (Oral)   Ht  (1.676 m)   Wt 180 lb (81.6 kg)   LMP 04/22/2019   SpO2 98%   BMI 29.05 kg/m    Physical Exam Vitals reviewed.  Constitutional:      General: She is not in acute distress.    Appearance: Normal appearance. She is not ill-appearing.  Skin:    General: Skin is warm and dry.     Capillary Refill: Capillary refill takes less than 2 seconds.     Comments: Small abrasion to right 5th toe, lateral from nailbed; no significant edema, erythema, warmth, drainage or streaking  Neurological:     Mental Status: She is alert and oriented to person, place, and time.  Psychiatric:        Mood and Affect: Mood normal.        Behavior: Behavior normal.        Thought Content: Thought content normal.        Judgment: Judgment normal.     No results found for any visits on 07/21/22.      Assessment & Plan:   Problem List Items Addressed This Visit   None Visit Diagnoses     Abrasion of toe of right foot, initial encounter    -  Primary No alarm findings on exam. Recommend keeping clean, warm water soaks, followed by topical antibiotic ointment (states she already has some) 2-3x/day. Patient aware of  signs/symptoms requiring further/urgent evaluation.  Continue ABX as previously prescribed for dog bite.        No orders of the defined types were placed in this encounter.   Return if symptoms worsen or fail to improve.  Clayborne Dana, NP

## 2022-07-28 ENCOUNTER — Other Ambulatory Visit: Payer: Self-pay

## 2022-07-28 DIAGNOSIS — C9210 Chronic myeloid leukemia, BCR/ABL-positive, not having achieved remission: Secondary | ICD-10-CM

## 2022-07-31 ENCOUNTER — Inpatient Hospital Stay: Payer: 59

## 2022-07-31 DIAGNOSIS — C9210 Chronic myeloid leukemia, BCR/ABL-positive, not having achieved remission: Secondary | ICD-10-CM

## 2022-07-31 LAB — CBC WITH DIFFERENTIAL (CANCER CENTER ONLY)
Abs Immature Granulocytes: 0.03 10*3/uL (ref 0.00–0.07)
Basophils Absolute: 0.1 10*3/uL (ref 0.0–0.1)
Basophils Relative: 1 %
Eosinophils Absolute: 0.5 10*3/uL (ref 0.0–0.5)
Eosinophils Relative: 5 %
HCT: 42.4 % (ref 36.0–46.0)
Hemoglobin: 14.9 g/dL (ref 12.0–15.0)
Immature Granulocytes: 0 %
Lymphocytes Relative: 30 %
Lymphs Abs: 3.2 10*3/uL (ref 0.7–4.0)
MCH: 30 pg (ref 26.0–34.0)
MCHC: 35.1 g/dL (ref 30.0–36.0)
MCV: 85.5 fL (ref 80.0–100.0)
Monocytes Absolute: 0.6 10*3/uL (ref 0.1–1.0)
Monocytes Relative: 6 %
Neutro Abs: 6.1 10*3/uL (ref 1.7–7.7)
Neutrophils Relative %: 58 %
Platelet Count: 305 10*3/uL (ref 150–400)
RBC: 4.96 MIL/uL (ref 3.87–5.11)
RDW: 12.2 % (ref 11.5–15.5)
WBC Count: 10.5 10*3/uL (ref 4.0–10.5)
nRBC: 0 % (ref 0.0–0.2)

## 2022-07-31 LAB — CMP (CANCER CENTER ONLY)
ALT: 20 U/L (ref 0–44)
AST: 19 U/L (ref 15–41)
Albumin: 4.3 g/dL (ref 3.5–5.0)
Alkaline Phosphatase: 74 U/L (ref 38–126)
Anion gap: 6 (ref 5–15)
BUN: 10 mg/dL (ref 6–20)
CO2: 31 mmol/L (ref 22–32)
Calcium: 9.9 mg/dL (ref 8.9–10.3)
Chloride: 104 mmol/L (ref 98–111)
Creatinine: 0.87 mg/dL (ref 0.44–1.00)
GFR, Estimated: >60 mL/min (ref >60)
Glucose, Bld: 109 mg/dL — ABNORMAL HIGH (ref 70–99)
Potassium: 4.3 mmol/L (ref 3.5–5.1)
Sodium: 141 mmol/L (ref 135–145)
Total Bilirubin: 0.5 mg/dL (ref 0.3–1.2)
Total Protein: 6.9 g/dL (ref 6.5–8.1)

## 2022-07-31 LAB — MAGNESIUM: Magnesium: 2 mg/dL (ref 1.7–2.4)

## 2022-08-01 ENCOUNTER — Telehealth: Payer: 59 | Admitting: Hematology

## 2022-08-01 ENCOUNTER — Inpatient Hospital Stay (HOSPITAL_BASED_OUTPATIENT_CLINIC_OR_DEPARTMENT_OTHER): Payer: 59 | Admitting: Hematology

## 2022-08-01 DIAGNOSIS — R21 Rash and other nonspecific skin eruption: Secondary | ICD-10-CM

## 2022-08-01 DIAGNOSIS — C9210 Chronic myeloid leukemia, BCR/ABL-positive, not having achieved remission: Secondary | ICD-10-CM

## 2022-08-01 NOTE — Progress Notes (Signed)
HEMATOLOGY/ONCOLOGY PHONE VISIT NOTE  Date of Service: 08/01/22   Patient Care Team: Copland, Gwenlyn Found, MD as PCP - General (Family Medicine)  CHIEF COMPLAINTS/PURPOSE OF CONSULTATION:  For continued evaluation and management of CML  HISTORY OF PRESENTING ILLNESS:  Please see previous note for details of initial presentation.  Interval History:   Victoria Hall  is a 52 y.o. female who is contacted via phone for continued evaluation and management of CML.  Patient was connected with me on 06/20/2022 via telemedicine and complained of insomnia, dizziness, vertigo, headache, early satiety, and skipping heartbeat after starting Sprycel. She also complained of early satiety and intermittent joint pain  Patient was most recently seen by Namon Cirri, PA, on 4/122024 and reported that her rash had nearly resolved. She described the rash as hives on her back, which radiated to her neck, face, scalp, legs, and abdomen. She reported that once she discontinued Sprycell, her rash improved.  I connected with Joslyn Hy on 08/01/22 at  3:30 PM EDT by telephone visit and verified that I am speaking with the correct person using two identifiers.   I discussed the limitations, risks, security and privacy concerns of performing an evaluation and management service by telemedicine and the availability of in-person appointments. I also discussed with the patient that there may be a patient responsible charge related to this service. The patient expressed understanding and agreed to proceed.   Other persons participating in the visit and their role in the encounter: husband   Patient's location: home  Provider's location: [facility]   Chief Complaint: For continued evaluation and management of CML   Today, she reports that rash began in her back, then spread to her torso, legs, face and scalp. She reports that these were mostly raised rashes with no significant redness. She  reports that her rash appeared once her Sprycell dose was increased to 300 MG.  She report that her rashes resolves once Sprycell was discontinued. She reports that she was told by dermatology that her rash was caused by Sprycell.  She reports that her energy levels returned to baseline while on Sprycell as well as when the medication was stopped. Patient has recently lost 20 pounds in less than a month.  She complains of joint pain all over body which she attributed to "an increased mast cell population", though there has not been any findings to suggest a mast cell disorder at this time. She reports that her TKI medication resolves mast cell symptoms.  MEDICAL HISTORY:  Past Medical History:  Diagnosis Date   Anxiety    Past hx- [facility] due to Low BP    Arthritis    Asthma    early teens only    Cervical intraepithelial neoplasia grade 3 2002   s/p LEEP Rx repeat pap negative   Cervical radiculopathy    CML (chronic myelocytic leukemia) (HCC) 06/03/2018   Fallopian tube disorder    right fallopian tube removed   Gallstones 09/2015   On CT   Headache    Migraines   Hemorrhagic ovarian cyst 02/08/2016   Hydrosalpinx    [facility]   Hydrosalpinx 02/08/2016   Interstitial cystitis    Neurological abnormality    Neg workup with MRI/MRA in 2007 WNL except decreased caliber MCA proximally, distal cavernous portion of left LCA which may represent true stenosis or technique on exam. Evaluated by Dr Luberta Robertson.   Palpitations    Partial seizures (HCC)  questionable diag   Pneumonia    walking pneumonia   Polycystic ovary    multiple ovarian cysts removed   S/P cervical discectomy    Dr Venetia Maxon, anterior discectomy C5-C7   S/P epidural steroid injection    Right hip   Seizures (HCC) 2005   simple partial seizure disorder-was mast cell problems per pt 09-15-2019- no seizures per pt     SURGICAL HISTORY: Past Surgical History:  Procedure Laterality Date    APPENDECTOMY  1986   BREAST BIOPSY Right 09/2019   BUNIONECTOMY Left    bunion removal   CERVICAL DISC SURGERY  2005   C5-C7   COLONOSCOPY     core breast biopsy      DENTAL SURGERY     fallopian tue removed     LAPAROSCOPIC BILATERAL SALPINGECTOMY Left 02/09/2016   Procedure: LAPAROSCOPIC LEFT  SALPINGECTOMY;  Surgeon: Sherian Rein, MD;  Location: [facility];  Service: Gynecology;  Laterality: Left;   LAPAROSCOPIC LYSIS OF ADHESIONS  02/09/2016   Procedure: LAPAROSCOPIC LYSIS OF ADHESIONS;  Surgeon: Sherian Rein, MD;  Location: [facility];  Service: Gynecology;;  momentum to adenexa   LAPAROSCOPIC OVARIAN CYSTECTOMY Right 02/09/2016   Procedure: LAPAROSCOPIC OVARIAN CYSTECTOMY;  Surgeon: Sherian Rein, MD;  Location: [facility];  Service: Gynecology;  Laterality: Right;  x2 to right ovary   LEEP  1998   OOPHORECTOMY Left 02/09/2016   Procedure: LEFT OOPHORECTOMY;  Surgeon: Sherian Rein, MD;  Location: [facility];  Service: Gynecology;  Laterality: Left;- pt states left ovary has NOT been removed    POLYPECTOMY     TONSILLECTOMY  2001   UPPER GASTROINTESTINAL ENDOSCOPY     UPPER GI ENDOSCOPY      SOCIAL HISTORY: Social History   Socioeconomic History   Marital status: Married    Spouse name: Not on file   Number of children: 0   Years of education: Not on file   Highest education level: Not on file  Occupational History   Occupation: novelist    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   Occupation: Runner, broadcasting/film/video   Occupation: Education administrator  Tobacco Use   Smoking status: Former    Packs/day: 0.00    Years: 0.00    Additional pack years: 0.00    Total pack years: 0.00    Types: Cigarettes    Quit date: 11/02/2015    Years since quitting: 6.7   Smokeless tobacco: Never   Tobacco comments:    currently smoking, began at beginning of June and plans to quit on June 14th. Has smoked off and on in the past.  Vaping Use   Vaping Use: Every day   Substance and Sexual Activity   Alcohol use: Yes    Alcohol/week: 1.0 - 2.0 standard drink of alcohol    Types: 1 - 2 Standard drinks or equivalent per week    Comment: occasionally 0-2 per day- rare    Drug use: No   Sexual activity: Yes  Other Topics Concern   Not on file  Social History Narrative   Not on file   Social Determinants of Health   Financial Resource Strain: Patient Declined (06/21/2022)   Overall Financial Resource Strain (CARDIA)    Difficulty of Paying Living Expenses: Patient declined  Food Insecurity: Patient Declined (06/21/2022)   Hunger Vital Sign    Worried About Running Out of Food in the Last Year: Patient declined    Ran Out of Food in the Last Year:  Patient declined  Transportation Needs: Patient Declined (06/21/2022)   PRAPARE - Administrator, Civil Service (Medical): Patient declined    Lack of Transportation (Non-Medical): Patient declined  Physical Activity: Unknown (06/21/2022)   Exercise Vital Sign    Days of Exercise per Week: Patient declined    Minutes of Exercise per Session: Not on file  Stress: No Stress Concern Present (06/21/2022)   [employer] of Occupational Health - Occupational Stress Questionnaire    Feeling of Stress : Not at all  Social Connections: Unknown (06/21/2022)   Social Connection and Isolation Panel [NHANES]    Frequency of Communication with Friends and Family: Patient declined    Frequency of Social Gatherings with Friends and Family: Patient declined    Attends Religious Services: Patient declined    Database administrator or Organizations: Patient declined    Attends Engineer, structural: Not on file    Marital Status: Patient declined  Catering manager Violence: Not on file    FAMILY HISTORY: Family History  Adopted: Yes  Problem Relation Age of Onset   COPD Mother    Breast cancer Mother    Other Mother        Teeth problems- all removed by age 2   Rashes / Skin problems Sister     Other Sister        Teeth problems- all removed by age 53   Rashes / Skin problems Brother    Asthma Brother    Other Brother        Teeth problems- all removed by age 93   Breast cancer Maternal Aunt    Adrenal disorder Neg Hx    Colon cancer Neg Hx    Colon polyps Neg Hx    Esophageal cancer Neg Hx    Stomach cancer Neg Hx    Rectal cancer Neg Hx     ALLERGIES:  is allergic to clindamycin/lincomycin, codeine, cyclobenzaprine, gabapentin, nyquil multi-symptom [pseudoeph-doxylamine-dm-apap], uribel [meth-hyo-m bl-na phos-ph sal], xanax [alprazolam], and urelle.  MEDICATIONS:  Current Outpatient Medications  Medication Sig Dispense Refill   clonazePAM (KLONOPIN) 0.5 MG tablet Take 1 tablet (0.5 mg total) by mouth 2 (two) times daily as needed for anxiety. 30 tablet 1   fluconazole (DIFLUCAN) 150 MG tablet Take 150 mg by mouth once. (Patient not taking: Reported on 07/21/2022)     No current facility-administered medications for this visit.    REVIEW OF SYSTEMS:    10 Point review of Systems was done is negative except as noted above.   PHYSICAL EXAMINATION: TELE-MED VISIT  LABORATORY DATA:  I have reviewed the data as listed      Latest Ref Rng & Units 07/31/2022   11:30 AM 07/03/2022   11:02 AM 06/19/2022   11:11 AM  CBC  WBC 4.0 - 10.5 K/uL 10.5  8.8  9.5   Hemoglobin 12.0 - 15.0 g/dL 16.1  09.6  04.5   Hematocrit 36.0 - 46.0 % 42.4  37.8  37.7   Platelets 150 - 400 K/uL 305  314  308     CBC    Component Value Date/Time   WBC 10.5 07/31/2022 1130   WBC 8.4 07/20/2021 1559   RBC 4.96 07/31/2022 1130   HGB 14.9 07/31/2022 1130   HGB 15.1 03/26/2017 1101   HCT 42.4 07/31/2022 1130   HCT 44.0 03/26/2017 1101   PLT 305 07/31/2022 1130   PLT 330 03/26/2017 1101   MCV 85.5 07/31/2022 1130  MCV 84 03/26/2017 1101   MCH 30.0 07/31/2022 1130   MCHC 35.1 07/31/2022 1130   RDW 12.2 07/31/2022 1130   RDW 14.0 03/26/2017 1101   LYMPHSABS 3.2 07/31/2022 1130    LYMPHSABS 2.8 03/26/2017 1101   MONOABS 0.6 07/31/2022 1130   EOSABS 0.5 07/31/2022 1130   EOSABS 0.4 03/26/2017 1101   BASOSABS 0.1 07/31/2022 1130   BASOSABS 0.2 03/26/2017 1101     .    Latest Ref Rng & Units 07/31/2022   11:30 AM 07/03/2022   11:02 AM 06/19/2022   11:11 AM  CMP  Glucose 70 - 99 mg/dL 161  096  045   BUN 6 - 20 mg/dL 10  10  11    Creatinine 0.44 - 1.00 mg/dL 4.09  8.11  9.14   Sodium 135 - 145 mmol/L 141  140  139   Potassium 3.5 - 5.1 mmol/L 4.3  3.9  4.1   Chloride 98 - 111 mmol/L 104  106  105   CO2 22 - 32 mmol/L 31  28  29    Calcium 8.9 - 10.3 mg/dL 9.9  9.8  9.4   Total Protein 6.5 - 8.1 g/dL 6.9  7.0  6.9   Total Bilirubin 0.3 - 1.2 mg/dL 0.5  0.4  0.4   Alkaline Phos 38 - 126 U/L 74  79  70   AST 15 - 41 U/L 19  20  29    ALT 0 - 44 U/L 20  28  42     05/16/18 Cytogenetics:   04/10/18 Hemochromatosis Panel:    RADIOGRAPHIC STUDIES: I have personally reviewed the radiological images as listed and agreed with the findings in the report. No results found.  01/31/18 ECHO Transthoracic:     ASSESSMENT & PLAN:   52 y.o. female with  1. CML- Chronic phase 05/16/18 BM Cytogenetics revealed translocation 9;22. FISH report revealed BCR-ABL gene rearrangement present in 86.5% of cells 06/03/18 FISH revealed BCR-ABL gene rearrangement present in 85.67% of cells, with some hypercellularity around 80% 06/05/18 US Abdomen revealed normal spleen size 01/31/18 ECHO revealed LV EF of 63%, no regional wall motion abnormalities, no significant valvular hear disease  Initial pre-treatment 07/25/18 BCR-ABL revealed IS at 71.1362%  08/28/18 EKG reviewed  2. 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 Mild copper deficiency- replaced.  PLAN:  -Discussed lab results from 07/31/2022 in detail with patient. CBC normal, showed WBC of 10.5K, hemoglobin of 14.9, and platelets of 305K. -discussed that patient's  rash is considered grade 3-4 even with 50% dose reduction of Sprycell  -Cannot justify trying to lower Sprycel dose even further and risk additional grade 3-4 skin toxicities -discussed goal to use medications that can safely be used for an extended period of time at reasonable doses -suggest starting Bosutinib at a reasonable dose at 100 mg po daily. Discussed possible side effects such as GI toxicities, bone marrow suppression, or LE swelling. Would plan to start at 25% of the dose and increase dose monthly. Discussed standard dose of 400-600 MG. -Patient would like to take some time to consider proceeding options before making a decision regarding treatment. Discussed that a decision is not significantly emergent at this time.  -Patient believes she might have a mast cell stabilization disorder in addition to CML -- not proven and that was what was causing this symptoms which were mitigated by her antihistamines -answered all of patient's questions regarding treatment, dose reduction, symptoms,  and lab results in detail -discussed option of patient to receive an additional opinion at [facility]. -no findings to suggest mast cell disorder at this time -discussed option to refer patient to mast cell specialist with [facility] for further evaluation -continue to follow-up with cardiology for optimal care regarding any skipped heartbeats or dizziness -will repeat lab work in one month   FOLLOW-UP: Labs in 4 weeks Phone visit with Dr Candise Che in 6 weeks  The total time spent in the appointment was 32 minutes* .  All of the patient's questions were answered with apparent satisfaction. The patient knows to call the clinic with any problems, questions or concerns.   Wyvonnia Lora MD MS AAHIVMS [facility] Hematology/Oncology Physician [facility]  .*Total Encounter Time as defined by the Centers for Medicare and Medicaid Services includes, in addition to the face-to-face time of a patient visit  (documented in the note above) non-face-to-face time: obtaining and reviewing outside history, ordering and reviewing medications, tests or procedures, care coordination (communications with other health care professionals or caregivers) and documentation in the medical record.    I,Mitra Faeizi,acting as a Neurosurgeon for Wyvonnia Lora, MD.,have documented all relevant documentation on the behalf of Wyvonnia Lora, MD,as directed by  Wyvonnia Lora, MD while in the presence of Wyvonnia Lora, MD.  .I have reviewed the above documentation for accuracy and completeness, and I agree with the above. Johney Maine MD   ADDENDUM  Patient send [place] a mychart msg that wshe would like to proceed with Bosutinib---sent prescription to [facility]  .Johney Maine MD

## 2022-08-02 ENCOUNTER — Telehealth: Payer: Self-pay | Admitting: Hematology

## 2022-08-02 ENCOUNTER — Encounter: Payer: Self-pay | Admitting: Hematology

## 2022-08-06 NOTE — Progress Notes (Signed)
Cardiology Office Note:   Date:  08/14/2022  ID:  Victoria Hall, DOB 08-31-1970, MRN [id number]  History of Present Illness:   Victoria Hall is a 52 y.o. female anxiety, CML, POTs, mast cell activation syndrome, and seizures who was referred by Victoria. Candise Hall for palpitations.  Was seen by Victoria. Candise Hall for [facility] where she reported palpitations and dizziness. Notes reviewed. Now referred to Cardiology for further evaluation.  Today, the patient states that was having palpitations when she was changed to a generic form of imatinib. She was since has been changed to bosutinob. Since being off the imatinib, her palpitations have significantly improved. Currently, no chest pain, SOB, lightheadedness, dizziness or syncope.   Past Medical History:  Diagnosis Date   Anxiety    [facility]- [facility] Low BP    Arthritis    Asthma    early teens only    Cervical intraepithelial neoplasia grade 3 2002   s/p LEEP Rx repeat pap negative   Cervical radiculopathy    CML (chronic myelocytic leukemia) (HCC) 06/03/2018   Fallopian tube disorder    right fallopian tube removed   Gallstones 09/2015   On CT   Headache    Migraines   Hemorrhagic ovarian cyst 02/08/2016   Hydrosalpinx    [facility]   Hydrosalpinx 02/08/2016   Interstitial cystitis    Neurological abnormality    Neg workup with MRI/MRA in 2007 WNL except decreased caliber MCA proximally, distal cavernous portion of left LCA which may represent true stenosis or technique on exam. Evaluated by Victoria Hall.   Palpitations    Partial seizures (HCC)    questionable diag   Pneumonia    walking pneumonia   Polycystic ovary    multiple ovarian cysts removed   S/P cervical discectomy    Victoria Victoria Hall, anterior discectomy C5-C7   S/P epidural steroid injection    Right hip   Seizures (HCC) 2005   simple partial seizure disorder-was mast cell problems per pt 09-15-2019- no seizures per pt      ROS: As per  HPI  Studies Reviewed:    EKG:  No new tracing today  Cardiac Studies & Procedures       ECHOCARDIOGRAM  ECHOCARDIOGRAM COMPLETE 05/29/2022  Narrative ECHOCARDIOGRAM REPORT    Patient Name:   Victoria Hall Date of Exam: 05/29/2022 Medical Rec #:  [id number]               Height:       66.0 in Accession #:    [id number]              Weight:       189.0 lb Date of Birth:  1970/12/02               BSA:          1.952 m Patient Age:    51 years                BP:           127/79 mmHg Patient Gender: F                       HR:           89 bpm. Exam Location:  Outpatient  Procedure: 2D Echo, 3D Echo, Color Doppler, Cardiac Doppler and Strain Analysis  Indications:    Chemo  History:  Patient has prior history of Echocardiogram examinations, most recent 08/03/2017. CML (chronic myelocytic leukemia).  Sonographer:    Victoria Hall Referring Phys: [id number] Victoria Hall   Sonographer Comments: Image acquisition challenging due to patient body habitus and Image acquisition challenging due to respiratory motion. Global longitudinal strain was attempted. IMPRESSIONS   1. Left ventricular ejection fraction, by estimation, is 60 to 65%. The left ventricle has normal function. The left ventricle has no regional wall motion abnormalities. Left ventricular diastolic parameters are consistent with Grade I diastolic dysfunction (impaired relaxation). The average left ventricular global longitudinal strain is -19.5 %. The global longitudinal strain is normal. 2. Right ventricular systolic function is normal. The right ventricular size is normal. Tricuspid regurgitation signal is inadequate for assessing PA pressure. 3. The mitral valve is normal in structure. No evidence of mitral valve regurgitation. No evidence of mitral stenosis. 4. The aortic valve is tricuspid. Aortic valve regurgitation is not visualized. 5. The inferior vena cava is dilated in size with >50%  respiratory variability, suggesting right atrial pressure of 8 mmHg.  Comparison(s): Unable to view 2019 study.  FINDINGS Left Ventricle: Left ventricular ejection fraction, by estimation, is 60 to 65%. The left ventricle has normal function. The left ventricle has no regional wall motion abnormalities. The average left ventricular global longitudinal strain is -19.5 %. The global longitudinal strain is normal. The left ventricular internal cavity size was normal in size. There is no left ventricular hypertrophy. Left ventricular diastolic parameters are consistent with Grade I diastolic dysfunction (impaired relaxation).  Right Ventricle: The right ventricular size is normal. No increase in right ventricular wall thickness. Right ventricular systolic function is normal. Tricuspid regurgitation signal is inadequate for assessing PA pressure.  Left Atrium: Left atrial size was normal in size.  Right Atrium: Right atrial size was normal in size.  Pericardium: Trivial pericardial effusion is present. The pericardial effusion is circumferential. Presence of epicardial fat layer.  Mitral Valve: The mitral valve is normal in structure. No evidence of mitral valve regurgitation. No evidence of mitral valve stenosis. MV peak gradient, 2.1 mmHg. The mean mitral valve gradient is 1.0 mmHg.  Tricuspid Valve: The tricuspid valve is normal in structure. Tricuspid valve regurgitation is not demonstrated. No evidence of tricuspid stenosis.  Aortic Valve: The aortic valve is tricuspid. Aortic valve regurgitation is not visualized.  Pulmonic Valve: The pulmonic valve was normal in structure. Pulmonic valve regurgitation is not visualized. No evidence of pulmonic stenosis.  Aorta: The aortic root and ascending aorta are structurally normal, with no evidence of dilitation.  Venous: The inferior vena cava is dilated in size with greater than 50% respiratory variability, suggesting right atrial pressure of 8  mmHg.  IAS/Shunts: No atrial level shunt detected by color flow Doppler.   LEFT VENTRICLE PLAX 2D LVIDd:         4.80 cm     Diastology LVIDs:         4.00 cm     LV e' medial:    7.94 cm/s LV PW:         0.80 cm     LV E/e' medial:  7.5 LV IVS:        0.70 cm     LV e' lateral:   11.10 cm/s LVOT diam:     2.10 cm     LV E/e' lateral: 5.4 LV SV:         74 LV SV Index:   38  2D Longitudinal Strain LVOT Area:     3.46 cm    2D Strain GLS Avg:     -19.5 %  LV Volumes (MOD) LV vol d, MOD A2C: 66.8 ml LV vol d, MOD A4C: 70.3 ml LV vol s, MOD A2C: 24.0 ml LV vol s, MOD A4C: 27.9 ml LV SV MOD A2C:     42.8 ml LV SV MOD A4C:     70.3 ml LV SV MOD BP:      42.8 ml  RIGHT VENTRICLE             IVC RV S prime:     12.50 cm/s  IVC diam: 2.10 cm TAPSE (M-mode): 2.2 cm  LEFT ATRIUM             Index        RIGHT ATRIUM          Index LA diam:        3.10 cm 1.59 cm/m   RA Area:     9.61 cm LA Vol (A2C):   30.9 ml 15.83 ml/m  RA Volume:   17.40 ml 8.91 ml/m LA Vol (A4C):   25.2 ml 12.91 ml/m LA Biplane Vol: 30.6 ml 15.67 ml/m AORTIC VALVE LVOT Vmax:   103.00 cm/s LVOT Vmean:  78.300 cm/s LVOT VTI:    0.214 m  AORTA Ao Root diam: 3.10 cm Ao Asc diam:  2.90 cm  MITRAL VALVE MV Area (PHT): 2.83 cm    SHUNTS MV Area VTI:   4.75 cm    Systemic VTI:  0.21 m MV Peak grad:  2.1 mmHg    Systemic Diam: 2.10 cm MV Mean grad:  1.0 mmHg MV Vmax:       0.72 m/s MV Vmean:      54.8 cm/s MV Decel Time: 268 msec MV E velocity: 59.60 cm/s MV A velocity: 74.60 cm/s MV E/A ratio:  0.80  Riley Lam MD Electronically signed by Riley Lam MD Signature Date/Time: 05/29/2022/1:35:23 PM    Final              Risk Assessment/Calculations:              Physical Exam:   VS:  BP 122/80   Pulse (!) 105   Ht 5\' 6"  (1.676 m)   Wt 179 lb 3.2 oz (81.3 kg)   LMP 04/22/2019   SpO2 97%   BMI 28.92 kg/m    Wt Readings from Last 3 Encounters:  08/14/22  179 lb 3.2 oz (81.3 kg)  07/21/22 180 lb (81.6 kg)  07/14/22 181 lb 11.2 oz (82.4 kg)     GEN: Well nourished, well developed in no acute distress NECK: No JVD; No carotid bruits CARDIAC: RRR, no murmurs, rubs, gallops RESPIRATORY:  Clear to auscultation without rales, wheezing or rhonchi  ABDOMEN: Soft, non-tender, non-distended EXTREMITIES:  No edema; No deformity   ASSESSMENT AND PLAN:    #Palpitations: -Significantly improved since stopping the generic of imatinib -TTE with LVEF 60-65%, normal RV, no significant valve disease -Will continue to monitor going forward; can consider cardiac monitor in future if symptoms recur  #Family history of CAD: -Maternal GF with MI in 75s; aunt with CVA in 30s, dad with suspected CAD -Consider Ca score in future; declined to have it today  #CML: -Follows with Victoria. Candise Hall         Signed, Meriam Sprague, MD

## 2022-08-08 ENCOUNTER — Other Ambulatory Visit (HOSPITAL_COMMUNITY): Payer: Self-pay

## 2022-08-08 ENCOUNTER — Encounter: Payer: Self-pay | Admitting: Family

## 2022-08-08 ENCOUNTER — Telehealth: Payer: Self-pay | Admitting: Pharmacy Technician

## 2022-08-08 ENCOUNTER — Telehealth: Payer: Self-pay | Admitting: Pharmacist

## 2022-08-08 DIAGNOSIS — C9210 Chronic myeloid leukemia, BCR/ABL-positive, not having achieved remission: Secondary | ICD-10-CM

## 2022-08-08 MED ORDER — BOSUTINIB 100 MG PO TABS: 100.0000 mg | ORAL_TABLET | Freq: Every day | ORAL | 1 refills | Status: DC

## 2022-08-08 MED ORDER — BOSUTINIB 100 MG PO TABS
100.0000 mg | ORAL_TABLET | Freq: Every day | ORAL | 1 refills | Status: DC
  Filled 2022-08-08: qty 30, 30d supply, fill #0

## 2022-08-08 NOTE — Telephone Encounter (Signed)
Oral Oncology Pharmacist Encounter  Received new prescription for Bosulif (bosutinib) for the treatment of CML, planned duration until disease progression or unacceptable drug toxicity.  CBC w/ Diff and CMP from 07/31/22 assessed, no relevant lab abnormalities requiring baseline dose adjustment required at this time. Due to patient's history of intolerance with TKIs patient starting on 100 mg/d per MD. Patient may be dose escalated if tolerated. Prescription dose and frequency assessed for appropriateness.  Current medication list in Epic reviewed, no relevant/significant DDIs with Bosulif identified.  Evaluated chart and no patient barriers to medication adherence noted.   Prescription has been e-scribed to [facility] for benefits analysis and approval.  Oral Oncology Clinic will continue to follow for insurance authorization, copayment issues, initial counseling and start date.  Lenord Carbo, PharmD, BCPS, BCOP Hematology/Oncology Clinical Pharmacist [facility] Oral Chemotherapy Navigation Clinics [phone] 08/08/2022 8:09 AM

## 2022-08-08 NOTE — Telephone Encounter (Signed)
Oral Oncology Patient Advocate Encounter  Prior Authorization for Bosulif has been approved.    PA# [id number] Effective dates: 08/08/22 through 03/09/23  Patient must fill at [facility].    Jinger Neighbors, CPhT-Adv Oncology Pharmacy Patient Advocate [facility] Direct Number: [phone]  Fax: [phone]

## 2022-08-08 NOTE — Telephone Encounter (Signed)
Oral Oncology Patient Advocate Encounter   Received notification that prior authorization for Bosulif is required.   PA submitted on 08/08/22 Key [id number] Status is pending     Jinger Neighbors, CPhT-Adv Oncology Pharmacy Patient Advocate [facility] Direct Number: [phone]  Fax: [phone]

## 2022-08-08 NOTE — Telephone Encounter (Signed)
Oral Oncology Patient Advocate Encounter   Was successful in obtaining a copay card for Bosulif.  This copay card will make the patients copay $0.  The billing information is as follows and has been shared with [facility].   RxBin: [id number] Member ID: [id number] Group ID: [id number]   Jinger Neighbors, CPhT-Adv Oncology Pharmacy Patient Advocate [facility] Direct Number: [phone]  Fax: [phone]

## 2022-08-14 ENCOUNTER — Encounter: Payer: Self-pay | Admitting: Cardiology

## 2022-08-14 ENCOUNTER — Ambulatory Visit: Payer: 59 | Attending: Cardiology | Admitting: Cardiology

## 2022-08-14 VITALS — BP 122/80 | HR 105 | Ht 66.0 in | Wt 179.2 lb

## 2022-08-14 DIAGNOSIS — R002 Palpitations: Secondary | ICD-10-CM | POA: Diagnosis not present

## 2022-08-14 DIAGNOSIS — Z8249 Family history of ischemic heart disease and other diseases of the circulatory system: Secondary | ICD-10-CM

## 2022-08-14 DIAGNOSIS — C9210 Chronic myeloid leukemia, BCR/ABL-positive, not having achieved remission: Secondary | ICD-10-CM

## 2022-08-14 NOTE — Patient Instructions (Signed)
Medication Instructions:   Your physician recommends that you continue on your current medications as directed. Please refer to the Current Medication list given to you today.  *If you need a refill on your cardiac medications before your next appointment, please call your pharmacy*    Follow-Up: At [facility], you and your health needs are our priority.  As part of our continuing mission to provide you with exceptional heart care, we have created designated Provider Care Teams.  These Care Teams include your primary Cardiologist (physician) and Advanced Practice Providers (APPs -  Physician Assistants and Nurse Practitioners) who all work together to provide you with the care you need, when you need it.  We recommend signing up for the patient portal called "MyChart".  Sign up information is provided on this After Visit Summary.  MyChart is used to connect with patients for Virtual Visits (Telemedicine).  Patients are able to view lab/test results, encounter notes, upcoming appointments, etc.  Non-urgent messages can be sent to your provider as well.   To learn more about what you can do with MyChart, go to [email or web address].    Your next appointment:   6 month(s)  Provider:   Dr. Pemberton   

## 2022-08-14 NOTE — Telephone Encounter (Signed)
Oral Chemotherapy Pharmacist Encounter  I spoke with patient for overview of new oral chemotherapy medication: Bosulif (bosutinib) for the treatment of chronic myeloid leukemia, planned duration until disease progression or unacceptable toxicity.  Counseled patient on administration, dosing, side effects, monitoring, drug-food interactions, safe handling, storage, and disposal.  Patient will take Bosulif 100mg  tablets, 1 tablet (100mg ) by mouth once daily with food.  Patient knows to avoid grapefruit or grapefruit juice while on therapy with Bosulif.  Bosulif start date: ~08/18/22  Side effects include but not limited to: diarrhea, nausea, vomiting, abdominal pain, fatigue, skin rash, edema, decreased blood counts, hepatotoxicity. Rare, but serious side effects include QTc prolongation.  Nausea/Vomiting: Patient states she will reach out if she requires an antiemetic while on Bosulif.    Diarrhea: Patient informed the majority of patients treated with Bosulif will experience symptoms of diarrhea. Median time to onset of diarrhea is 2-3 days, with symptoms lasting 3 days on average.Patient will obtain anti diarrheal and alert the office of 4 or more loose stools above baseline. Discussed OK for patient to resume famotidine while on Bosulif, but famotidine will need to be administered at least 2 hours or more prior to Bosulif. Patient plans to take famotidine/loratadine in the AM and Bosulif in the PM, so this will be an adequate separation of medication administration.   Reviewed with patient importance of keeping a medication schedule and plan for any missed doses. No barriers to medication adherence identified.  Medication reconciliation performed and medication/allergy list updated.  All questions answered.  Victoria Hall voiced understanding and appreciation.   Medication education handout placed in mail for patient. Patient knows to call the office with questions or concerns. Oral  Chemotherapy Clinic phone number provided to patient.   Lenord Carbo, PharmD, BCPS, [facility] Hematology/Oncology Clinical Pharmacist [facility] and [facility] Oral Chemotherapy Navigation Clinics [phone] 08/14/2022 1:36 PM

## 2022-08-14 NOTE — Telephone Encounter (Signed)
Oral Chemotherapy Pharmacist Encounter   Attempted to reach patient to provide update and offer for initial counseling on oral medication: Bosulif (bosutinib).   No answer. Left voicemail for patient to call back to discuss details of medication acquisition and initial counseling session.  Patient's medication is scheduled to deliver to her home on 08/16/22 from [facility].   Lenord Carbo, PharmD, BCPS, [facility] Hematology/Oncology Clinical Pharmacist [facility] and [facility] Oral Chemotherapy Navigation Clinics [phone] 08/14/2022 11:47 AM

## 2022-08-21 ENCOUNTER — Other Ambulatory Visit: Payer: Self-pay | Admitting: Family Medicine

## 2022-08-21 ENCOUNTER — Encounter: Payer: Self-pay | Admitting: Family Medicine

## 2022-08-21 DIAGNOSIS — Z1231 Encounter for screening mammogram for malignant neoplasm of breast: Secondary | ICD-10-CM

## 2022-08-21 DIAGNOSIS — N949 Unspecified condition associated with female genital organs and menstrual cycle: Secondary | ICD-10-CM

## 2022-08-21 MED ORDER — ESTRADIOL 0.1 MG/GM VA CREA
TOPICAL_CREAM | VAGINAL | 12 refills | Status: DC
Start: 2022-08-21 — End: 2023-02-21

## 2022-08-21 NOTE — Addendum Note (Signed)
Addended by: Pearline Cables on: 08/21/2022 08:27 PM   Modules accepted: Orders

## 2022-08-25 ENCOUNTER — Other Ambulatory Visit: Payer: Self-pay

## 2022-08-25 DIAGNOSIS — C9210 Chronic myeloid leukemia, BCR/ABL-positive, not having achieved remission: Secondary | ICD-10-CM

## 2022-08-29 ENCOUNTER — Inpatient Hospital Stay: Payer: 59 | Attending: Family

## 2022-08-29 ENCOUNTER — Other Ambulatory Visit: Payer: Self-pay

## 2022-08-29 DIAGNOSIS — C9210 Chronic myeloid leukemia, BCR/ABL-positive, not having achieved remission: Secondary | ICD-10-CM | POA: Diagnosis present

## 2022-08-29 LAB — CBC WITH DIFFERENTIAL (CANCER CENTER ONLY)
Abs Immature Granulocytes: 0.03 10*3/uL (ref 0.00–0.07)
Basophils Absolute: 0.1 10*3/uL (ref 0.0–0.1)
Basophils Relative: 1 %
Eosinophils Absolute: 0.4 10*3/uL (ref 0.0–0.5)
Eosinophils Relative: 4 %
HCT: 41.3 % (ref 36.0–46.0)
Hemoglobin: 14.6 g/dL (ref 12.0–15.0)
Immature Granulocytes: 0 %
Lymphocytes Relative: 27 %
Lymphs Abs: 2.7 10*3/uL (ref 0.7–4.0)
MCH: 29.6 pg (ref 26.0–34.0)
MCHC: 35.4 g/dL (ref 30.0–36.0)
MCV: 83.6 fL (ref 80.0–100.0)
Monocytes Absolute: 0.6 10*3/uL (ref 0.1–1.0)
Monocytes Relative: 7 %
Neutro Abs: 6.1 10*3/uL (ref 1.7–7.7)
Neutrophils Relative %: 61 %
Platelet Count: 306 10*3/uL (ref 150–400)
RBC: 4.94 MIL/uL (ref 3.87–5.11)
RDW: 12.2 % (ref 11.5–15.5)
WBC Count: 9.9 10*3/uL (ref 4.0–10.5)
nRBC: 0 % (ref 0.0–0.2)

## 2022-08-29 LAB — CMP (CANCER CENTER ONLY)
ALT: 21 U/L (ref 0–44)
AST: 19 U/L (ref 15–41)
Albumin: 4.2 g/dL (ref 3.5–5.0)
Alkaline Phosphatase: 80 U/L (ref 38–126)
Anion gap: 4 — ABNORMAL LOW (ref 5–15)
BUN: 11 mg/dL (ref 6–20)
CO2: 28 mmol/L (ref 22–32)
Calcium: 9.7 mg/dL (ref 8.9–10.3)
Chloride: 106 mmol/L (ref 98–111)
Creatinine: 0.82 mg/dL (ref 0.44–1.00)
GFR, Estimated: >60 mL/min (ref >60)
Glucose, Bld: 114 mg/dL — ABNORMAL HIGH (ref 70–99)
Potassium: 4.3 mmol/L (ref 3.5–5.1)
Sodium: 138 mmol/L (ref 135–145)
Total Bilirubin: 0.3 mg/dL (ref 0.3–1.2)
Total Protein: 6.9 g/dL (ref 6.5–8.1)

## 2022-08-29 LAB — MAGNESIUM: Magnesium: 1.8 mg/dL (ref 1.7–2.4)

## 2022-09-07 LAB — BCR/ABL

## 2022-09-11 ENCOUNTER — Inpatient Hospital Stay: Payer: 59 | Attending: Family | Admitting: Hematology

## 2022-09-11 DIAGNOSIS — C9210 Chronic myeloid leukemia, BCR/ABL-positive, not having achieved remission: Secondary | ICD-10-CM | POA: Diagnosis not present

## 2022-09-11 DIAGNOSIS — Z79899 Other long term (current) drug therapy: Secondary | ICD-10-CM | POA: Diagnosis not present

## 2022-09-11 DIAGNOSIS — R21 Rash and other nonspecific skin eruption: Secondary | ICD-10-CM | POA: Insufficient documentation

## 2022-09-11 DIAGNOSIS — Z803 Family history of malignant neoplasm of breast: Secondary | ICD-10-CM | POA: Insufficient documentation

## 2022-09-11 DIAGNOSIS — Z87891 Personal history of nicotine dependence: Secondary | ICD-10-CM | POA: Diagnosis not present

## 2022-09-11 NOTE — Progress Notes (Signed)
HEMATOLOGY/ONCOLOGY PHONE VISIT NOTE  Date of Service: 09/11/22  Patient Care Team: Copland, Gwenlyn Found, MD as PCP - General (Family Medicine)  CHIEF COMPLAINTS/PURPOSE OF CONSULTATION:  For continued evaluation and management of CML  HISTORY OF PRESENTING ILLNESS:  Please see previous note for details of initial presentation.  Interval History:   Victoria Hall  is a 52 y.o. female who is contacted via phone for continued evaluation and management of CML.  Patient was connected with me on 07/22/2022 via telemedicine and complained of a rash that began in her back and spread to her torso, legs, face and scalp. She also noted 20 pound wight loss and joint pain.   I connected with Joslyn Hy on 09/11/22 at  3:30 PM EDT by telephone visit and verified that I am speaking with the correct person using two identifiers.   Patient reports she has been doing fairly well since our last visit. She has started Bosulif 100 mg since our last visit. She complains of fatigue, shortness of breath, weight gain, bilateral arm skin rashes, joint pain, but no leg swelling after starting Bosulif. Patient notes that she had same skin rashes previously with gleevec.  Discussed lab results with the patient. Advised patient to come to the symptom management clinic tomorrow to evaluate skin rashes.    I discussed the limitations, risks, security and privacy concerns of performing an evaluation and management service by telemedicine and the availability of in-person appointments. I also discussed with the patient that there may be a patient responsible charge related to this service. The patient expressed understanding and agreed to proceed.   Other persons participating in the visit and their role in the encounter: None   Patient's location: Home  Provider's location: [facility]   Chief Complaint: continued evaluation and management of CML    MEDICAL HISTORY:  Past Medical History:   Diagnosis Date   Anxiety    Past hx- [facility] due to Low BP    Arthritis    Asthma    early teens only    Cervical intraepithelial neoplasia grade 3 2002   s/p LEEP Rx repeat pap negative   Cervical radiculopathy    CML (chronic myelocytic leukemia) (HCC) 06/03/2018   Fallopian tube disorder    right fallopian tube removed   Gallstones 09/2015   On CT   Headache    Migraines   Hemorrhagic ovarian cyst 02/08/2016   Hydrosalpinx    [facility]   Hydrosalpinx 02/08/2016   Interstitial cystitis    Neurological abnormality    Neg workup with MRI/MRA in 2007 WNL except decreased caliber MCA proximally, distal cavernous portion of left LCA which may represent true stenosis or technique on exam. Evaluated by Dr Luberta Robertson.   Palpitations    Partial seizures (HCC)    questionable diag   Pneumonia    walking pneumonia   Polycystic ovary    multiple ovarian cysts removed   S/P cervical discectomy    Dr Venetia Maxon, anterior discectomy C5-C7   S/P epidural steroid injection    Right hip   Seizures (HCC) 2005   simple partial seizure disorder-was mast cell problems per pt 09-15-2019- no seizures per pt     SURGICAL HISTORY: Past Surgical History:  Procedure Laterality Date   APPENDECTOMY  1986   BREAST BIOPSY Right 09/2019   BUNIONECTOMY Left    bunion removal   CERVICAL DISC SURGERY  2005   C5-C7   COLONOSCOPY  core breast biopsy      DENTAL SURGERY     fallopian tue removed     LAPAROSCOPIC BILATERAL SALPINGECTOMY Left 02/09/2016   Procedure: LAPAROSCOPIC LEFT  SALPINGECTOMY;  Surgeon: Sherian Rein, MD;  Location: [facility];  Service: Gynecology;  Laterality: Left;   LAPAROSCOPIC LYSIS OF ADHESIONS  02/09/2016   Procedure: LAPAROSCOPIC LYSIS OF ADHESIONS;  Surgeon: Sherian Rein, MD;  Location: [facility];  Service: Gynecology;;  momentum to adenexa   LAPAROSCOPIC OVARIAN CYSTECTOMY Right 02/09/2016   Procedure: LAPAROSCOPIC OVARIAN CYSTECTOMY;   Surgeon: Sherian Rein, MD;  Location: [facility];  Service: Gynecology;  Laterality: Right;  x2 to right ovary   LEEP  1998   OOPHORECTOMY Left 02/09/2016   Procedure: LEFT OOPHORECTOMY;  Surgeon: Sherian Rein, MD;  Location: [facility];  Service: Gynecology;  Laterality: Left;- pt states left ovary has NOT been removed    POLYPECTOMY     TONSILLECTOMY  2001   UPPER GASTROINTESTINAL ENDOSCOPY     UPPER GI ENDOSCOPY      SOCIAL HISTORY: Social History   Socioeconomic History   Marital status: Married    Spouse name: Not on file   Number of children: 0   Years of education: Not on file   Highest education level: Not on file  Occupational History   Occupation: novelist    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   Occupation: Runner, broadcasting/film/video   Occupation: Education administrator  Tobacco Use   Smoking status: Former    Packs/day: 0.00    Years: 0.00    Additional pack years: 0.00    Total pack years: 0.00    Types: Cigarettes    Quit date: 11/02/2015    Years since quitting: 6.7   Smokeless tobacco: Never   Tobacco comments:    currently smoking, began at beginning of June and plans to quit on June 14th. Has smoked off and on in the past.  Vaping Use   Vaping Use: Every day  Substance and Sexual Activity   Alcohol use: Yes    Alcohol/week: 1.0 - 2.0 standard drink of alcohol    Types: 1 - 2 Standard drinks or equivalent per week    Comment: occasionally 0-2 per day- rare    Drug use: No   Sexual activity: Yes  Other Topics Concern   Not on file  Social History Narrative   Not on file   Social Determinants of Health   Financial Resource Strain: Patient Declined (06/21/2022)   Overall Financial Resource Strain (CARDIA)    Difficulty of Paying Living Expenses: Patient declined  Food Insecurity: Patient Declined (06/21/2022)   Hunger Vital Sign    Worried About Running Out of Food in the Last Year: Patient declined    Ran Out of Food in the Last Year:  Patient declined  Transportation Needs: Patient Declined (06/21/2022)   PRAPARE - Administrator, Civil Service (Medical): Patient declined    Lack of Transportation (Non-Medical): Patient declined  Physical Activity: Unknown (06/21/2022)   Exercise Vital Sign    Days of Exercise per Week: Patient declined    Minutes of Exercise per Session: Not on file  Stress: No Stress Concern Present (06/21/2022)   [employer] of Occupational Health - Occupational Stress Questionnaire    Feeling of Stress : Not at all  Social Connections: Unknown (06/21/2022)   Social Connection and Isolation Panel [NHANES]    Frequency of Communication with Friends and Family: Patient declined  Frequency of Social Gatherings with Friends and Family: Patient declined    Attends Religious Services: Patient declined    Database administrator or Organizations: Patient declined    Attends Engineer, structural: Not on file    Marital Status: Patient declined  Catering manager Violence: Not on file    FAMILY HISTORY: Family History  Adopted: Yes  Problem Relation Age of Onset   COPD Mother    Breast cancer Mother    Other Mother        Teeth problems- all removed by age 26   Rashes / Skin problems Sister    Other Sister        Teeth problems- all removed by age 40   Rashes / Skin problems Brother    Asthma Brother    Other Brother        Teeth problems- all removed by age 68   Breast cancer Maternal Aunt    Adrenal disorder Neg Hx    Colon cancer Neg Hx    Colon polyps Neg Hx    Esophageal cancer Neg Hx    Stomach cancer Neg Hx    Rectal cancer Neg Hx     ALLERGIES:  is allergic to clindamycin/lincomycin, codeine, cyclobenzaprine, gabapentin, nyquil multi-symptom [pseudoeph-doxylamine-dm-apap], uribel [meth-hyo-m bl-na phos-ph sal], xanax [alprazolam], and urelle.  MEDICATIONS:  Current Outpatient Medications  Medication Sig Dispense Refill   clonazePAM (KLONOPIN) 0.5 MG  tablet Take 1 tablet (0.5 mg total) by mouth 2 (two) times daily as needed for anxiety. 30 tablet 1   fluconazole (DIFLUCAN) 150 MG tablet Take 150 mg by mouth once. (Patient not taking: Reported on 07/21/2022)     No current facility-administered medications for this visit.    REVIEW OF SYSTEMS:    10 Point review of Systems was done is negative except as noted above.   PHYSICAL EXAMINATION: TELE-MED VISIT  LABORATORY DATA:  I have reviewed the data as listed      Latest Ref Rng & Units 08/29/2022   10:24 AM 07/31/2022   11:30 AM 07/03/2022   11:02 AM  CBC  WBC 4.0 - 10.5 K/uL 9.9  10.5  8.8   Hemoglobin 12.0 - 15.0 g/dL 11.9  14.7  82.9   Hematocrit 36.0 - 46.0 % 41.3  42.4  37.8   Platelets 150 - 400 K/uL 306  305  314     CBC    Component Value Date/Time   WBC 10.5 07/31/2022 1130   WBC 8.4 07/20/2021 1559   RBC 4.96 07/31/2022 1130   HGB 14.9 07/31/2022 1130   HGB 15.1 03/26/2017 1101   HCT 42.4 07/31/2022 1130   HCT 44.0 03/26/2017 1101   PLT 305 07/31/2022 1130   PLT 330 03/26/2017 1101   MCV 85.5 07/31/2022 1130   MCV 84 03/26/2017 1101   MCH 30.0 07/31/2022 1130   MCHC 35.1 07/31/2022 1130   RDW 12.2 07/31/2022 1130   RDW 14.0 03/26/2017 1101   LYMPHSABS 3.2 07/31/2022 1130   LYMPHSABS 2.8 03/26/2017 1101   MONOABS 0.6 07/31/2022 1130   EOSABS 0.5 07/31/2022 1130   EOSABS 0.4 03/26/2017 1101   BASOSABS 0.1 07/31/2022 1130   BASOSABS 0.2 03/26/2017 1101     .    Latest Ref Rng & Units 08/29/2022   10:24 AM 07/31/2022   11:30 AM 07/03/2022   11:02 AM  CMP  Glucose 70 - 99 mg/dL 562  130  865   BUN 6 - 20  mg/dL 11  10  10    Creatinine 0.44 - 1.00 mg/dL 1.61  0.96  0.45   Sodium 135 - 145 mmol/L 138  141  140   Potassium 3.5 - 5.1 mmol/L 4.3  4.3  3.9   Chloride 98 - 111 mmol/L 106  104  106   CO2 22 - 32 mmol/L 28  31  28    Calcium 8.9 - 10.3 mg/dL 9.7  9.9  9.8   Total Protein 6.5 - 8.1 g/dL 6.9  6.9  7.0   Total Bilirubin 0.3 - 1.2 mg/dL 0.3  0.5   0.4   Alkaline Phos 38 - 126 U/L 80  74  79   AST 15 - 41 U/L 19  19  20    ALT 0 - 44 U/L 21  20  28      05/16/18 Cytogenetics:   04/10/18 Hemochromatosis Panel:    RADIOGRAPHIC STUDIES: I have personally reviewed the radiological images as listed and agreed with the findings in the report. No results found.  01/31/18 ECHO Transthoracic:     ASSESSMENT & PLAN:   52 y.o. female with  1. CML- Chronic phase 05/16/18 BM Cytogenetics revealed translocation 9;22. FISH report revealed BCR-ABL gene rearrangement present in 86.5% of cells 06/03/18 FISH revealed BCR-ABL gene rearrangement present in 85.67% of cells, with some hypercellularity around 80% 06/05/18 US Abdomen revealed normal spleen size 01/31/18 ECHO revealed LV EF of 63%, no regional wall motion abnormalities, no significant valvular hear disease  Initial pre-treatment 07/25/18 BCR-ABL revealed IS at 71.1362%  08/28/18 EKG reviewed  2. 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 Mild copper deficiency- replaced.  PLAN: -Discussed lab results from with the patient. CBC and CMP are stable.  -Discussed with the patient to see [place] in symptom evaluation clinic tomorrow/ASAP to evaluate skin rashes due to Bosulif 100 mg.   -Discussed the option of referral to [facility]. Patient will do her research and will let us know where she wants to be referred to.  -Advised patient to hold Bosulif until her skin rashes are evaluated.    FOLLOW-UP: RTC with Dr Candise Che with labs in 2 months  The total time spent in the appointment was 20 minutes* .  All of the patient's questions were answered with apparent satisfaction. The patient knows to call the clinic with any problems, questions or concerns.   Wyvonnia Lora MD MS AAHIVMS [facility] Hematology/Oncology Physician [facility]  .*Total Encounter Time as defined by the Centers for Medicare and Medicaid Services  includes, in addition to the face-to-face time of a patient visit (documented in the note above) non-face-to-face time: obtaining and reviewing outside history, ordering and reviewing medications, tests or procedures, care coordination (communications with other health care professionals or caregivers) and documentation in the medical record.   I, Ok Edwards, am acting as a Neurosurgeon for Wyvonnia Lora, MD.  .I have reviewed the above documentation for accuracy and completeness, and I agree with the above. Johney Maine MD

## 2022-09-14 ENCOUNTER — Inpatient Hospital Stay (HOSPITAL_BASED_OUTPATIENT_CLINIC_OR_DEPARTMENT_OTHER): Payer: 59 | Admitting: Physician Assistant

## 2022-09-14 ENCOUNTER — Other Ambulatory Visit: Payer: Self-pay

## 2022-09-14 VITALS — BP 102/76 | HR 92 | Temp 99.3°F | Resp 18 | Wt 180.5 lb

## 2022-09-14 DIAGNOSIS — C9210 Chronic myeloid leukemia, BCR/ABL-positive, not having achieved remission: Secondary | ICD-10-CM | POA: Diagnosis not present

## 2022-09-14 DIAGNOSIS — R21 Rash and other nonspecific skin eruption: Secondary | ICD-10-CM

## 2022-09-14 MED ORDER — TRIAMCINOLONE ACETONIDE 0.5 % EX OINT: 1.0000 | TOPICAL_OINTMENT | Freq: Two times a day (BID) | CUTANEOUS | 0 refills | Status: DC

## 2022-09-14 NOTE — Progress Notes (Signed)
Symptom Management Consult Note [facility]    Patient Care Team: Copland, Gwenlyn Found, MD as PCP - General (Family Medicine)    Name / MRN / DOB: Victoria Hall  [id number]  1970/10/05   Date of visit: 09/14/2022   Chief Complaint/Reason for visit: rash   Current Therapy: Bosulif      ASSESSMENT & PLAN: Patient is a 52 y.o. female  with oncologic history of CML followed by Dr. Candise Che.  I have viewed most recent oncology note and lab work.    #CML and rash -Per recent oncology note from 6/10 patient was advised to hold Bosulif till she could be evaluated for the rash -Patient today is reporting she has continued Bosulif.  On exam she has maculopapular rash on bilateral arms and back.  Grade 2. -Patient is already taking loratadine and famotidine.  Encouraged her to continue these and prescription sent to the pharmacy for triamcinolone ointment.  I advised patient to hold Bosulif until the rash can resolve however she prefers to continue taking it.  I discussed this with her at length. She is not experiencing symptoms of anaphylaxis.  Exam without concern for secondary bacterial infection, does not need an antibiotic. -I will arrange for patient to have follow up with Dr. Candise Che to discuss treatment moving forward.  Strict ED precautions discussed should symptoms worsens.   Heme/Onc History: Oncology History   No history exists.      Interval history-: Victoria Hall is a 52 y.o. female with oncologic history of CML presenting to [facility] today with chief complaint of rash.  She is accompanied by her spouse who provides additional history.  Patient states she started taking Bosulif on May 14.  She noticed a rash within days of starting the medication.  The rash is located on her arms and back.  She describes it as small dots and open sores.  She has associated intermittent itching.  She has been taking loratadine and famotidine daily prior to starting Bosulif.   She denies any new lotions, ointments or creams.  No one in the home has similar rash.  She states this rash is similar to when she was taking Imatinib.  She continues to endorse feeling fatigued, sedated and short of breath while taking Bosulif which she has already reported to oncologist.  She denies any changes in those symptoms.  She denies any difficulty breathing, throat tightness, nausea or vomiting.    ROS  All other systems are reviewed and are negative for acute change except as noted in the HPI.    Allergies  Allergen Reactions   Clindamycin/Lincomycin Itching and Other (See Comments)    Dizziness, trouble swallowing   Codeine Itching and Nausea And Vomiting   Cyclobenzaprine Other (See Comments)    Fast heartbeat, restless leg, nightmares   Gabapentin Other (See Comments)    Headaches, visual disturbances.    Nyquil Multi-Symptom [Pseudoeph-Doxylamine-Dm-Apap] Other (See Comments)    Heart racing, dizziness, weakness   Uribel [Meth-Hyo-M Bl-Na Phos-Ph Sal] Other (See Comments)    Heart racing, dizziness, blurry vision.   Xanax [Alprazolam]     Bruising   Urelle Nausea And Vomiting     Past Medical History:  Diagnosis Date   Anxiety    [facility] h[facility]- [facility] Low BP    Arthritis    Asthma    early teens only    Cervical intraepithelial neoplasia grade 3 2002   s/p LEEP Rx  repeat pap negative   Cervical radiculopathy    CML (chronic myelocytic leukemia) (HCC) 06/03/2018   Fallopian tube disorder    right fallopian tube removed   Gallstones 09/2015   On CT   Headache    Migraines   Hemorrhagic ovarian cyst 02/08/2016   Hydrosalpinx    [facility]   Hydrosalpinx 02/08/2016   Interstitial cystitis    Neurological abnormality    Neg workup with MRI/MRA in 2007 WNL except decreased caliber MCA proximally, distal cavernous portion of left LCA which may represent true stenosis or technique on exam. Evaluated by Dr Luberta Robertson.   Palpitations     Partial seizures (HCC)    questionable diag   Pneumonia    walking pneumonia   Polycystic ovary    multiple ovarian cysts removed   S/P cervical discectomy    Dr Venetia Maxon, anterior discectomy C5-C7   S/P epidural steroid injection    Right hip   Seizures (HCC) 2005   simple partial seizure disorder-was mast cell problems per pt 09-15-2019- no seizures per pt      Past Surgical History:  Procedure Laterality Date   APPENDECTOMY  1986   BREAST BIOPSY Right 09/2019   BUNIONECTOMY Left    bunion removal   CERVICAL DISC SURGERY  2005   C5-C7   COLONOSCOPY     core breast biopsy      DENTAL SURGERY     fallopian tue removed     LAPAROSCOPIC BILATERAL SALPINGECTOMY Left 02/09/2016   Procedure: LAPAROSCOPIC LEFT  SALPINGECTOMY;  Surgeon: Sherian Rein, MD;  Location: [facility];  Service: Gynecology;  Laterality: Left;   LAPAROSCOPIC LYSIS OF ADHESIONS  02/09/2016   Procedure: LAPAROSCOPIC LYSIS OF ADHESIONS;  Surgeon: Sherian Rein, MD;  Location: [facility];  Service: Gynecology;;  momentum to adenexa   LAPAROSCOPIC OVARIAN CYSTECTOMY Right 02/09/2016   Procedure: LAPAROSCOPIC OVARIAN CYSTECTOMY;  Surgeon: Sherian Rein, MD;  Location: [facility];  Service: Gynecology;  Laterality: Right;  x2 to right ovary   LEEP  1998   OOPHORECTOMY Left 02/09/2016   Procedure: LEFT OOPHORECTOMY;  Surgeon: Sherian Rein, MD;  Location: [facility];  Service: Gynecology;  Laterality: Left;- pt states left ovary has NOT been removed    POLYPECTOMY     TONSILLECTOMY  2001   UPPER GASTROINTESTINAL ENDOSCOPY     UPPER GI ENDOSCOPY      Social History   Socioeconomic History   Marital status: Married    Spouse name: Not on file   Number of children: 0   Years of education: Not on file   Highest education level: Not on file  Occupational History   Occupation: novelist    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   Occupation: Runner, broadcasting/film/video   Occupation:  Education administrator  Tobacco Use   Smoking status: Former    Packs/day: 0.00    Years: 0.00    Additional pack years: 0.00    Total pack years: 0.00    Types: Cigarettes    Quit date: 11/02/2015    Years since quitting: 6.8   Smokeless tobacco: Never   Tobacco comments:    currently smoking, began at beginning of June and plans to quit on June 14th. Has smoked off and on in the past.  Vaping Use   Vaping Use: Every day  Substance and Sexual Activity   Alcohol use: Yes    Alcohol/week: 1.0 - 2.0 standard drink of alcohol    Types:  1 - 2 Standard drinks or equivalent per week    Comment: occasionally 0-2 per day- rare    Drug use: No   Sexual activity: Yes  Other Topics Concern   Not on file  Social History Narrative   Not on file   Social Determinants of Health   Financial Resource Strain: Patient Declined (06/21/2022)   Overall Financial Resource Strain (CARDIA)    Difficulty of Paying Living Expenses: Patient declined  Food Insecurity: Patient Declined (06/21/2022)   Hunger Vital Sign    Worried About Running Out of Food in the Last Year: Patient declined    Ran Out of Food in the Last Year: Patient declined  Transportation Needs: Patient Declined (06/21/2022)   PRAPARE - Administrator, Civil Service (Medical): Patient declined    Lack of Transportation (Non-Medical): Patient declined  Physical Activity: Unknown (06/21/2022)   Exercise Vital Sign    Days of Exercise per Week: Patient declined    Minutes of Exercise per Session: Not on file  Stress: No Stress Concern Present (06/21/2022)   [employer] of Occupational Health - Occupational Stress Questionnaire    Feeling of Stress : Not at all  Social Connections: Unknown (06/21/2022)   Social Connection and Isolation Panel [NHANES]    Frequency of Communication with Friends and Family: Patient declined    Frequency of Social Gatherings with Friends and Family: Patient declined    Attends Religious Services: Patient  declined    Database administrator or Organizations: Patient declined    Attends Engineer, structural: Not on file    Marital Status: Patient declined  Catering manager Violence: Not on file    Family History  Adopted: Yes  Problem Relation Age of Onset   COPD Mother    Breast cancer Mother    Other Mother        Teeth problems- all removed by age 78   Rashes / Skin problems Sister    Other Sister        Teeth problems- all removed by age 9   Rashes / Skin problems Brother    Asthma Brother    Other Brother        Teeth problems- all removed by age 26   Breast cancer Maternal Aunt    Adrenal disorder Neg Hx    Colon cancer Neg Hx    Colon polyps Neg Hx    Esophageal cancer Neg Hx    Stomach cancer Neg Hx    Rectal cancer Neg Hx      Current Outpatient Medications:    triamcinolone ointment (KENALOG) 0.5 %, Apply 1 Application topically 2 (two) times daily., Disp: 30 g, Rfl: 0   bosutinib (BOSULIF) 100 MG tablet, Take 1 tablet (100 mg total) by mouth daily with breakfast. Take with food., Disp: 30 tablet, Rfl: 1   clonazePAM (KLONOPIN) 0.5 MG tablet, Take 1 tablet (0.5 mg total) by mouth 2 (two) times daily as needed for anxiety., Disp: 30 tablet, Rfl: 1   estradiol (ESTRACE VAGINAL) 0.1 MG/GM vaginal cream, Place 0.5- 1gm vaginally. Use daily for 2 weeks, then use 2-3x weekly as needed to maintain vaginal comfort, Disp: 42.5 g, Rfl: 12   famotidine (PEPCID) 20 MG tablet, Take 10 mg by mouth daily., Disp: , Rfl:    loratadine (CLARITIN) 10 MG tablet, Take 10 mg by mouth daily., Disp: , Rfl:   PHYSICAL EXAM: ECOG FS:1 - Symptomatic but completely ambulatory    Vitals:  09/14/22 1439  BP: 102/76  Pulse: 92  Resp: 18  Temp: 99.3 F (37.4 C)  TempSrc: Oral  SpO2: 99%  Weight: 180 lb 8 oz (81.9 kg)   Physical Exam Vitals and nursing note reviewed.  Constitutional:      Appearance: She is not ill-appearing or toxic-appearing.  HENT:     Head:  Normocephalic.     Mouth/Throat:     Mouth: Mucous membranes are moist.     Pharynx: Oropharynx is clear.  Eyes:     Conjunctiva/sclera: Conjunctivae normal.  Cardiovascular:     Rate and Rhythm: Normal rate.  Pulmonary:     Effort: Pulmonary effort is normal.  Abdominal:     General: There is no distension.  Musculoskeletal:     Cervical back: Normal range of motion.  Skin:    General: Skin is warm and dry.     Findings: Rash present.     Comments: Faint papular rash on back and bilateral arms. No wounds.  Please see media below  Neurological:     Mental Status: She is alert.        LABORATORY DATA: I have reviewed the data as listed    Latest Ref Rng & Units 08/29/2022   10:24 AM 07/31/2022   11:30 AM 07/03/2022   11:02 AM  CBC  WBC 4.0 - 10.5 K/uL 9.9  10.5  8.8   Hemoglobin 12.0 - 15.0 g/dL 41.3  24.4  01.0   Hematocrit 36.0 - 46.0 % 41.3  42.4  37.8   Platelets 150 - 400 K/uL 306  305  314         Latest Ref Rng & Units 08/29/2022   10:24 AM 07/31/2022   11:30 AM 07/03/2022   11:02 AM  CMP  Glucose 70 - 99 mg/dL 272  536  644   BUN 6 - 20 mg/dL 11  10  10    Creatinine 0.44 - 1.00 mg/dL 0.34  7.42  5.95   Sodium 135 - 145 mmol/L 138  141  140   Potassium 3.5 - 5.1 mmol/L 4.3  4.3  3.9   Chloride 98 - 111 mmol/L 106  104  106   CO2 22 - 32 mmol/L 28  31  28    Calcium 8.9 - 10.3 mg/dL 9.7  9.9  9.8   Total Protein 6.5 - 8.1 g/dL 6.9  6.9  7.0   Total Bilirubin 0.3 - 1.2 mg/dL 0.3  0.5  0.4   Alkaline Phos 38 - 126 U/L 80  74  79   AST 15 - 41 U/L 19  19  20    ALT 0 - 44 U/L 21  20  28         RADIOGRAPHIC STUDIES (from last 24 hours if applicable) I have personally reviewed the radiological images as listed and agreed with the findings in the report. No results found.      Visit Diagnosis: 1. CML (chronic myelocytic leukemia) (HCC)   2. Rash      No orders of the defined types were placed in this encounter.   All questions were answered. The  patient knows to call the clinic with any problems, questions or concerns. No barriers to learning was detected.  A total of more than 30 minutes were spent on this encounter with face-to-face time and non-face-to-face time, including preparing to see the patient, ordering medications, counseling the patient and coordination of care as outlined above.    Thank you  for allowing me to participate in the care of this patient.    Shanon Ace, PA-C Department of Hematology/Oncology [facility] Phone: [phone]  [phone]    09/14/2022 4:55 PM

## 2022-09-18 ENCOUNTER — Ambulatory Visit
Admission: RE | Admit: 2022-09-18 | Discharge: 2022-09-18 | Disposition: A | Discharge status: Home / Self Care | Payer: 59 | Source: Ambulatory Visit | Attending: Family Medicine | Admitting: Family Medicine

## 2022-09-18 ENCOUNTER — Encounter: Payer: Self-pay | Admitting: Family

## 2022-09-18 DIAGNOSIS — Z1231 Encounter for screening mammogram for malignant neoplasm of breast: Secondary | ICD-10-CM

## 2022-09-19 ENCOUNTER — Telehealth: Payer: Self-pay | Admitting: Hematology

## 2022-09-20 ENCOUNTER — Other Ambulatory Visit: Payer: Self-pay | Admitting: Hematology

## 2022-09-20 ENCOUNTER — Other Ambulatory Visit: Payer: Self-pay

## 2022-09-20 DIAGNOSIS — C9210 Chronic myeloid leukemia, BCR/ABL-positive, not having achieved remission: Secondary | ICD-10-CM

## 2022-09-21 ENCOUNTER — Other Ambulatory Visit: Payer: Self-pay | Admitting: Family Medicine

## 2022-09-21 ENCOUNTER — Encounter: Payer: Self-pay | Admitting: Family Medicine

## 2022-09-21 DIAGNOSIS — R928 Other abnormal and inconclusive findings on diagnostic imaging of breast: Secondary | ICD-10-CM

## 2022-09-27 ENCOUNTER — Ambulatory Visit
Admission: RE | Admit: 2022-09-27 | Discharge: 2022-09-27 | Disposition: A | Discharge status: Home / Self Care | Payer: 59 | Source: Ambulatory Visit | Attending: Family Medicine | Admitting: Family Medicine

## 2022-09-27 ENCOUNTER — Ambulatory Visit: Payer: 59

## 2022-09-27 DIAGNOSIS — R928 Other abnormal and inconclusive findings on diagnostic imaging of breast: Secondary | ICD-10-CM

## 2022-10-21 NOTE — Progress Notes (Unsigned)
[place] at [facility] [address] [place], [place] [address] [phone]  Date:  10/25/2022   Name:  Victoria Hall   DOB:  [birth year]   MRN:  [id number]  PCP:  Victoria Cables, MD    Chief Complaint: cough and HA (Intense HA's worse with exertion. Pt says this has started since her Leukemia med was changed.  She says the cough has resolved. )   History of Present Illness:  Victoria Hall is a 52 y.o. very pleasant female patient who presents with the following:  Patient seen today for sick visit, concern of cough and headaches Patient with history of CML, POTS, mast cell activation syndrome, hypermobility, hemochromatosis, chronic fatigue. Her oncologist is Victoria. Candise Hall  Most recent visit with myself was in March  Smoking history?  May qualify for lung cancer screening  Pt notes she has noted some HA with exertion- has occurred 3 times,  will be "super intense," last 5-10 minutes and then ease off.  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   Hypermobility syndrome 09/26/2017   Mast cell activation syndrome (HCC) 09/26/2017   POTS (postural orthostatic tachycardia syndrome) 08/10/2017   Incomplete right bundle branch block 07/11/2017   Fatigue 07/11/2017   Low back pain 07/21/2016   Right knee pain 06/05/2016   Left shoulder pain  06/05/2016   Hydrosalpinx 02/08/2016   Hemorrhagic ovarian cyst 02/08/2016   Right hip pain 02/03/2016   HYDROSALPINX 02/07/2008   HEADACHE 10/28/2007   TACHYCARDIA 10/14/2007   ANXIETY DISORDER 05/28/2007   POLYARTHRITIS 05/09/2007   UNSPECIFIED DISORDERS OF NERVOUS SYSTEM 04/22/2007   ABDOMINAL PAIN RIGHT LOWER QUADRANT 04/08/2007   PALPITATIONS, RECURRENT 11/07/2006   PROBLEMS W/SMELL/TASTE 06/29/2006   TOBACCO ABUSE 01/08/2006   VISUAL IMPAIRMENT 01/08/2006   DISTURBANCE, VISUAL NOS 01/08/2006   FIBROCYSTIC BREAST DISEASE 01/08/2006   DISC DISEASE, CERVICAL 01/08/2006   VERTIGO 01/08/2006    Past Medical History:  Diagnosis Date   Anxiety    [facility]- [facility] due [facility] Low BP    Arthritis    Asthma    early teens only    Cervical intraepithelial neoplasia grade 3 2002   s/p LEEP Rx repeat pap negative   Cervical radiculopathy    CML (chronic myelocytic leukemia) (HCC) 06/03/2018   Fallopian tube disorder    right fallopian tube removed   Gallstones 09/2015   On CT   Headache    Migraines   Hemorrhagic ovarian cyst 02/08/2016   Hydrosalpinx    [facility]   Hydrosalpinx 02/08/2016   Interstitial cystitis    Neurological abnormality    Neg workup with MRI/MRA in 2007 WNL except decreased caliber MCA proximally, distal cavernous portion of left LCA which may represent true stenosis or technique on exam. Evaluated by  Victoria Hall.   Palpitations    Partial seizures (HCC)    questionable diag   Pneumonia    walking pneumonia   Polycystic ovary    multiple ovarian cysts removed   S/P cervical discectomy    [facility] [place], anterior discectomy C5-C7   S/P epidural steroid injection    Right hip   Seizures (HCC) 2005   simple partial seizure disorder-was mast cell problems per pt 09-15-2019- no seizures per pt     Past Surgical History:  Procedure Laterality Date   APPENDECTOMY  1986   BREAST BIOPSY Right 09/2019   BUNIONECTOMY Left    bunion  removal   CERVICAL DISC SURGERY  2005   C5-C7   COLONOSCOPY     core breast biopsy      DENTAL SURGERY     fallopian tue removed     LAPAROSCOPIC BILATERAL SALPINGECTOMY Left 02/09/2016   Procedure: LAPAROSCOPIC LEFT  SALPINGECTOMY;  Surgeon: Victoria Rein, MD;  Location: [facility];  Service: Gynecology;  Laterality: Left;   LAPAROSCOPIC LYSIS OF ADHESIONS  02/09/2016   Procedure: LAPAROSCOPIC LYSIS OF ADHESIONS;  Surgeon: Victoria Rein, MD;  Location: [facility];  Service: Gynecology;;  momentum to adenexa   LAPAROSCOPIC OVARIAN CYSTECTOMY Right 02/09/2016   Procedure: LAPAROSCOPIC OVARIAN CYSTECTOMY;  Surgeon: Victoria Rein, MD;  Location: [facility];  Service: Gynecology;  Laterality: Right;  x2 to right ovary   LEEP  1998   OOPHORECTOMY Left 02/09/2016   Procedure: LEFT OOPHORECTOMY;  Surgeon: Victoria Rein, MD;  Location: [facility];  Service: Gynecology;  Laterality: Left;- pt states left ovary has NOT been removed    POLYPECTOMY     TONSILLECTOMY  2001   UPPER GASTROINTESTINAL ENDOSCOPY     UPPER GI ENDOSCOPY      Social History   Tobacco Use   Smoking status: Former    Current packs/day: 0.00    Types: Cigarettes    Quit date: 11/02/2015    Years since quitting: 6.9   Smokeless tobacco: Never   Tobacco comments:    currently smoking, began at beginning of June and plans to quit on June 14th. Has smoked off and on in the past.  Vaping Use   Vaping status: Every Day  Substance Use Topics   Alcohol use: Yes    Alcohol/week: 1.0 - 2.0 standard drink of alcohol    Types: 1 - 2 Standard drinks or equivalent per week    Comment: occasionally 0-2 per day- rare    Drug use: No    Family History  Adopted: Yes  Problem Relation Age of Onset   COPD Mother    Breast cancer Mother    Other Mother        Teeth problems- all removed by age 5   Rashes / Skin problems Sister    Other Sister        Teeth problems- all removed by age 8   Rashes / Skin problems  Brother    Asthma Brother    Other Brother        Teeth problems- all removed by age 8   Breast cancer Maternal Aunt    Adrenal disorder Neg Hx    Colon cancer Neg Hx    Colon polyps Neg Hx    Esophageal cancer Neg Hx    Stomach cancer Neg Hx    Rectal cancer Neg Hx     Allergies  Allergen Reactions   Clindamycin/Lincomycin Itching and Other (See Comments)  Dizziness, trouble swallowing   Codeine Itching and Nausea And Vomiting   Cyclobenzaprine Other (See Comments)    Fast heartbeat, restless leg, nightmares   Gabapentin Other (See Comments)    Headaches, visual disturbances.    Nyquil Multi-Symptom [Pseudoeph-Doxylamine-Dm-Apap] Other (See Comments)    Heart racing, dizziness, weakness   Uribel [Meth-Hyo-M Bl-Na Phos-Ph Sal] Other (See Comments)    Heart racing, dizziness, blurry vision.   Xanax [Alprazolam]     Bruising   Urelle Nausea And Vomiting    Medication list has been reviewed and updated.  Current Outpatient Medications on File Prior to Visit  Medication Sig Dispense Refill   BOSULIF 100 MG tablet TAKE 1 TABLET BY MOUTH 1 TIME A DAY WITH BREAKFAST. 30 tablet 1   clonazePAM (KLONOPIN) 0.5 MG tablet Take 1 tablet (0.5 mg total) by mouth 2 (two) times daily as needed for anxiety. 30 tablet 1   estradiol (ESTRACE VAGINAL) 0.1 MG/GM vaginal cream Place 0.5- 1gm vaginally. Use daily for 2 weeks, then use 2-3x weekly as needed to maintain vaginal comfort 42.5 g 12   famotidine (PEPCID) 20 MG tablet Take 10 mg by mouth daily.     loratadine (CLARITIN) 10 MG tablet Take 10 mg by mouth daily.     triamcinolone ointment (KENALOG) 0.5 % Apply 1 Application topically 2 (two) times daily. 30 g 0   No current facility-administered medications on file prior to visit.    Review of Systems:  As per HPI- otherwise negative.   Physical Examination: Vitals:   10/25/22 1531  BP: 122/80  Pulse: 86  Resp: 18  Temp: 97.6 F (36.4 C)  SpO2: 97%   Vitals:   10/25/22  1531  Weight: 181 lb (82.1 kg)  Height: 5\' 6"  (1.676 m)   Body mass index is 29.21 kg/m. Ideal Body Weight: Weight in (lb) to have BMI = 25: 154.6  GEN: no acute distress.  Overweight, looks well HEENT: Atraumatic, Normocephalic.  Ears and Nose: No external deformity. CV: RRR, No M/G/R. No JVD. No thrill. No extra heart sounds. PULM: CTA B, no wheezes, crackles, rhonchi. No retractions. No resp. distress. No accessory muscle use. ABD: S, NT, ND EXTR: No c/c/e PSYCH: Normally interactive. Conversant.   Neuro exam normal today including facial sensation and movement, strength and sensation, DTR of all limbs, normal balance Assessment and Plan: ***  Signed Abbe Amsterdam, MD

## 2022-10-25 ENCOUNTER — Ambulatory Visit: Payer: 59 | Admitting: Family Medicine

## 2022-10-25 VITALS — BP 122/80 | HR 86 | Temp 97.6°F | Resp 18 | Ht 66.0 in | Wt 181.0 lb

## 2022-10-25 DIAGNOSIS — G4482 Headache associated with sexual activity: Secondary | ICD-10-CM

## 2022-10-25 NOTE — Patient Instructions (Addendum)
It was good to see you today- we will set up imaging for your headaches asap If you have any other concerns in the meantime please let me know

## 2022-10-26 ENCOUNTER — Encounter: Payer: Self-pay | Admitting: Family Medicine

## 2022-11-13 ENCOUNTER — Ambulatory Visit (HOSPITAL_COMMUNITY)
Admission: RE | Admit: 2022-11-13 | Discharge: 2022-11-13 | Disposition: A | Discharge status: Home / Self Care | Payer: 59 | Source: Ambulatory Visit | Attending: Family Medicine | Admitting: Family Medicine

## 2022-11-13 ENCOUNTER — Other Ambulatory Visit: Payer: Self-pay | Admitting: Hematology

## 2022-11-13 DIAGNOSIS — C9210 Chronic myeloid leukemia, BCR/ABL-positive, not having achieved remission: Secondary | ICD-10-CM

## 2022-11-13 DIAGNOSIS — G4482 Headache associated with sexual activity: Secondary | ICD-10-CM | POA: Diagnosis present

## 2022-11-13 MED ORDER — GADOBUTROL 1 MMOL/ML IV SOLN
8.0000 mL | Freq: Once | INTRAVENOUS | Status: AC | PRN
  Administered 2022-11-13: 8 mL via INTRAVENOUS

## 2022-11-14 ENCOUNTER — Telehealth: Payer: Self-pay | Admitting: Neurology

## 2022-11-14 ENCOUNTER — Encounter: Payer: Self-pay | Admitting: Family Medicine

## 2022-11-14 ENCOUNTER — Other Ambulatory Visit: Payer: Self-pay | Admitting: Family Medicine

## 2022-11-14 DIAGNOSIS — I671 Cerebral aneurysm, nonruptured: Secondary | ICD-10-CM

## 2022-11-14 DIAGNOSIS — R9082 White matter disease, unspecified: Secondary | ICD-10-CM

## 2022-11-14 NOTE — Telephone Encounter (Signed)
Dr Patsy Lager has spoken with the pt.

## 2022-11-14 NOTE — Telephone Encounter (Signed)
Call report from [facility] with Radiology on patient's MRA head. Concerned about:   1. 6.0 x 3.5 mm aneurysm arising from the distal cavernous/ophthalmic left internal carotid artery, increased in size from the prior MRA head of 05/29/2005. Neuro-interventional consultation is recommended.  Results available in chart.

## 2022-11-14 NOTE — Telephone Encounter (Signed)
Please see below.   DOD: Laury Axon.

## 2022-11-15 ENCOUNTER — Encounter: Payer: Self-pay | Admitting: Neurology

## 2022-11-16 ENCOUNTER — Other Ambulatory Visit: Payer: Self-pay

## 2022-11-21 ENCOUNTER — Telehealth: Payer: Self-pay | Admitting: Hematology

## 2022-11-21 NOTE — Telephone Encounter (Signed)
Patient is receiving care at a different facility and wanted to cancel her appointments

## 2022-11-24 NOTE — Addendum Note (Signed)
Addended by: Abbe Amsterdam C on: 11/24/2022 11:41 AM   Modules accepted: Orders

## 2022-11-27 ENCOUNTER — Ambulatory Visit: Payer: 59 | Admitting: Hematology

## 2022-11-27 ENCOUNTER — Other Ambulatory Visit: Payer: 59
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
## 2023-01-22 ENCOUNTER — Encounter: Payer: Self-pay | Admitting: Neurology

## 2023-01-22 ENCOUNTER — Ambulatory Visit: Payer: 59 | Admitting: Neurology

## 2023-01-22 VITALS — BP 103/68 | HR 84 | Ht 66.0 in | Wt 181.6 lb

## 2023-01-22 DIAGNOSIS — I671 Cerebral aneurysm, nonruptured: Secondary | ICD-10-CM | POA: Diagnosis not present

## 2023-01-22 DIAGNOSIS — G4484 Primary exertional headache: Secondary | ICD-10-CM

## 2023-01-22 MED ORDER — INDOMETHACIN 25 MG PO CAPS: ORAL_CAPSULE | ORAL | 5 refills | Status: DC

## 2023-01-22 NOTE — Patient Instructions (Signed)
Take indomethacin 25mg  capsule:  Take 1 to 2 capsules 30-60 minutes prior to activity OR you may take at onset of headache.  Follow up 6 months.

## 2023-02-19 NOTE — Progress Notes (Unsigned)
[facility] [address] [place], [place] [address] [phone]  Date:  02/21/2023   Name:  Victoria Hall   DOB:  [birth year]   MRN:  [id number]  PCP:  Pearline Cables, MD    Chief Complaint: Vaginal Discharge (Pt wonders if she has a systemic yeast infection since she has the vaginal sxs, cracking fingers, and cracking in the corners of her mouth. /Bladder: painful when full. Does not feel like a UTI. )   History of Present Illness:  Victoria Hall is a 52 y.o. very pleasant female patient who presents with the following:  Pt seen today with concern of vaginal discharge and itching She notes she still get's "tearing" in her vaginal tissue with intercourse which can be very painful She also seems to have angular cheilitis which is being treated with an antifungal -she wonders if the 2 things might be related.   Last seen by myself in July  Patient with history of CML, POTS, mast cell activation syndrome, hypermobility, hemochromatosis, chronic fatigue. Her oncologist is now through [facility]  She is seeing an allergy/ immunology specialist at [facility] also - she is seeing them next week   She has tried a 7 day tx of monistat which may have helped somewhat but did not resolve her symptoms She also notes some lower back pain and "bladder pain"- she feels like her bladder hurts when it fills up but it does not hurt to urinate -does not feel like UTIs she has had in the past.  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   Hypermobility syndrome 09/26/2017   Mast cell activation syndrome (HCC) 09/26/2017   POTS (postural orthostatic tachycardia syndrome) 08/10/2017   Incomplete right bundle branch block 07/11/2017   Fatigue 07/11/2017   Low back pain  07/21/2016   Right knee pain 06/05/2016   Left shoulder pain 06/05/2016   Hydrosalpinx 02/08/2016   Hemorrhagic ovarian cyst 02/08/2016   Right hip pain 02/03/2016   HYDROSALPINX 02/07/2008   HEADACHE 10/28/2007   TACHYCARDIA 10/14/2007   ANXIETY DISORDER 05/28/2007   POLYARTHRITIS 05/09/2007   UNSPECIFIED DISORDERS OF NERVOUS SYSTEM 04/22/2007   ABDOMINAL PAIN RIGHT LOWER QUADRANT 04/08/2007   PALPITATIONS, RECURRENT 11/07/2006   PROBLEMS W/SMELL/TASTE 06/29/2006   TOBACCO ABUSE 01/08/2006   VISUAL IMPAIRMENT 01/08/2006   DISTURBANCE, VISUAL NOS 01/08/2006   FIBROCYSTIC BREAST DISEASE 01/08/2006   DISC DISEASE, CERVICAL 01/08/2006   VERTIGO 01/08/2006    Past Medical History:  Diagnosis Date   Anxiety    [facility]- [facility] due to Low BP    Arthritis    Asthma    early teens only    Cervical intraepithelial neoplasia grade 3 2002   s/p LEEP Rx repeat pap negative   Cervical radiculopathy    CML (chronic myelocytic leukemia) (HCC) 06/03/2018   Fallopian tube disorder    right fallopian tube removed   Gallstones 09/2015   On CT   Headache    Migraines   Hemorrhagic ovarian cyst 02/08/2016   Hydrosalpinx    [facility]   Hydrosalpinx 02/08/2016   Interstitial cystitis    Neurological abnormality    Neg workup with MRI/MRA in 2007 WNL except decreased caliber MCA proximally, distal cavernous portion of  left LCA which may represent true stenosis or technique on exam. Evaluated by Dr Luberta Robertson.   Palpitations    Partial seizures (HCC)    questionable diag   Pneumonia    walking pneumonia   Polycystic ovary    multiple ovarian cysts removed   S/P cervical discectomy    Dr Venetia Maxon, anterior discectomy C5-C7   S/P epidural steroid injection    Right hip   Seizures (HCC) 2005   simple partial seizure disorder-was mast cell problems per pt 09-15-2019- no seizures per pt     Past Surgical History:  Procedure Laterality Date   APPENDECTOMY  1986    BREAST BIOPSY Right 09/2019   BUNIONECTOMY Left    bunion removal   CERVICAL DISC SURGERY  2005   C5-C7   COLONOSCOPY     core breast biopsy      DENTAL SURGERY     fallopian tue removed     LAPAROSCOPIC BILATERAL SALPINGECTOMY Left 02/09/2016   Procedure: LAPAROSCOPIC LEFT  SALPINGECTOMY;  Surgeon: Sherian Rein, MD;  Location: [facility];  Service: Gynecology;  Laterality: Left;   LAPAROSCOPIC LYSIS OF ADHESIONS  02/09/2016   Procedure: LAPAROSCOPIC LYSIS OF ADHESIONS;  Surgeon: Sherian Rein, MD;  Location: [facility];  Service: Gynecology;;  momentum to adenexa   LAPAROSCOPIC OVARIAN CYSTECTOMY Right 02/09/2016   Procedure: LAPAROSCOPIC OVARIAN CYSTECTOMY;  Surgeon: Sherian Rein, MD;  Location: [facility];  Service: Gynecology;  Laterality: Right;  x2 to right ovary   LEEP  1998   OOPHORECTOMY Left 02/09/2016   Procedure: LEFT OOPHORECTOMY;  Surgeon: Sherian Rein, MD;  Location: [facility];  Service: Gynecology;  Laterality: Left;- pt states left ovary has NOT been removed    POLYPECTOMY     TONSILLECTOMY  2001   UPPER GASTROINTESTINAL ENDOSCOPY     UPPER GI ENDOSCOPY      Social History   Tobacco Use   Smoking status: Former    Current packs/day: 0.00    Types: Cigarettes    Quit date: 11/02/2015    Years since quitting: 7.3   Smokeless tobacco: Never   Tobacco comments:    currently smoking, began at beginning of June and plans to quit on June 14th. Has smoked off and on in the past.    Currently   Vaping Use   Vaping status: Former  Substance Use Topics   Alcohol use: Yes    Alcohol/week: 1.0 - 2.0 standard drink of alcohol    Types: 1 - 2 Standard drinks or equivalent per week    Comment: occasionally 0-2 per day- rare    Drug use: No    Family History  Adopted: Yes  Problem Relation Age of Onset   Neuropathy Mother    COPD Mother    Breast cancer Mother    Other Mother        Teeth problems- all removed by age 34   Rashes / Skin problems Sister     Other Sister        Teeth problems- all removed by age 20   Rashes / Skin problems Brother    Asthma Brother    Other Brother        Teeth problems- all removed by age 22   Stroke Maternal Aunt    Neuropathy Maternal Aunt    Breast cancer Maternal Aunt    Neuropathy Maternal Grandmother    Adrenal disorder Neg Hx    Colon cancer Neg Hx    Colon polyps Neg  Hx    Esophageal cancer Neg Hx    Stomach cancer Neg Hx    Rectal cancer Neg Hx     Allergies  Allergen Reactions   Clindamycin/Lincomycin Itching and Other (See Comments)    Dizziness, trouble swallowing   Codeine Itching and Nausea And Vomiting   Cyclobenzaprine Other (See Comments)    Fast heartbeat, restless leg, nightmares   Gabapentin Other (See Comments)    Headaches, visual disturbances.    Nyquil Multi-Symptom [Pseudoeph-Doxylamine-Dm-Apap] Other (See Comments)    Heart racing, dizziness, weakness   Uribel [Meth-Hyo-M Bl-Na Phos-Ph Sal] Other (See Comments)    Heart racing, dizziness, blurry vision.   Xanax [Alprazolam]     Bruising   Urelle Nausea And Vomiting    Medication list has been reviewed and updated.  No current outpatient medications on file prior to visit.   No current facility-administered medications on file prior to visit.    Review of Systems:  As per HPI- otherwise negative.   Physical Examination: Vitals:   02/21/23 1340  BP: 110/72  Pulse: 91  Resp: 18  Temp: 97.8 F (36.6 C)  SpO2: 97%   Vitals:   02/21/23 1340  Weight: 178 lb 3.2 oz (80.8 kg)   Body mass index is 28.76 kg/m. Ideal Body Weight:    GEN: no acute distress.  Mildly overweight, looks well  HEENT: Atraumatic, Normocephalic.  No oral lesions or thrush Ears and Nose: No external deformity. CV: RRR, No M/G/R. No JVD. No thrill. No extra heart sounds. PULM: CTA B, no wheezes, crackles, rhonchi. No retractions. No resp. distress. No accessory muscle use. ABD: S, NT, ND EXTR: No c/c/e PSYCH: Normally  interactive. Conversant.  Vaginal exam: Some erythema and fragility of vaginal tissues is evident.  There is some thick white discharge in vaginal vault.  Patient has some discomfort but is able to tolerate gentle speculum exam Angular cheilitis is not currently visible  Assessment and Plan: Vaginal itching - Plan: Cervicovaginal ancillary only( [place]), fluconazole (DIFLUCAN) 150 MG tablet  Angular cheilitis - Plan: Cervicovaginal ancillary only( [place])  Bladder pain - Plan: Urine Culture  Patient seen today with concern of yeast vaginitis; she notes when she is on chemotherapeutic she is not typically able to use fluconazole so she would like to get this under control now in case she needs to go back on chemotherapy at some point  Will have her take fluconazole 150, repeat in 3 days and then repeat weekly as needed.  Urine culture, vaginal swab pending  Signed Abbe Amsterdam, MD

## 2023-02-20 ENCOUNTER — Ambulatory Visit: Payer: 59 | Admitting: Cardiology

## 2023-02-21 ENCOUNTER — Ambulatory Visit: Payer: 59 | Admitting: Family Medicine

## 2023-02-21 ENCOUNTER — Other Ambulatory Visit (HOSPITAL_COMMUNITY)
Admission: RE | Admit: 2023-02-21 | Discharge: 2023-02-21 | Disposition: A | Discharge status: Home / Self Care | Payer: 59 | Source: Ambulatory Visit | Attending: Family Medicine | Admitting: Family Medicine

## 2023-02-21 VITALS — BP 110/72 | HR 91 | Temp 97.8°F | Resp 18 | Wt 178.2 lb

## 2023-02-21 DIAGNOSIS — R3989 Other symptoms and signs involving the genitourinary system: Secondary | ICD-10-CM

## 2023-02-21 DIAGNOSIS — N898 Other specified noninflammatory disorders of vagina: Secondary | ICD-10-CM

## 2023-02-21 DIAGNOSIS — K130 Diseases of lips: Secondary | ICD-10-CM

## 2023-02-21 MED ORDER — FLUCONAZOLE 150 MG PO TABS: 150.0000 mg | ORAL_TABLET | Freq: Once | ORAL | 0 refills | Status: AC

## 2023-02-21 NOTE — Patient Instructions (Signed)
I will be in touch with your results asap Try diflucan - take one, then one in 3 days, then once weekly for 4-8 weeks until your vaginal area is feeling back to normal!  Please keep me posted

## 2023-02-22 LAB — URINE CULTURE
MICRO NUMBER:: 15757013
Result:: NO GROWTH
SPECIMEN QUALITY:: ADEQUATE

## 2023-02-23 ENCOUNTER — Encounter: Payer: Self-pay | Admitting: Family Medicine

## 2023-02-23 LAB — CERVICOVAGINAL ANCILLARY ONLY
Bacterial Vaginitis (gardnerella): NEGATIVE
Candida Glabrata: NEGATIVE
Candida Vaginitis: POSITIVE — AB
Comment: NEGATIVE
Comment: NEGATIVE
Comment: NEGATIVE

## 2023-03-29 ENCOUNTER — Ambulatory Visit: Payer: 59 | Admitting: Neurology
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
## 2023-07-23 ENCOUNTER — Ambulatory Visit: Payer: 59 | Admitting: Neurology

## 2023-07-23 ENCOUNTER — Encounter: Payer: Self-pay | Admitting: Neurology

## 2023-07-23 VITALS — BP 101/73 | HR 103 | Ht 66.0 in | Wt 183.2 lb

## 2023-07-23 DIAGNOSIS — M5412 Radiculopathy, cervical region: Secondary | ICD-10-CM

## 2023-07-23 DIAGNOSIS — G4484 Primary exertional headache: Secondary | ICD-10-CM | POA: Diagnosis not present

## 2023-07-23 DIAGNOSIS — I671 Cerebral aneurysm, nonruptured: Secondary | ICD-10-CM | POA: Diagnosis not present

## 2023-07-23 NOTE — Patient Instructions (Signed)
 May take indomethacin  25 to 50mg  as needed for headache Follow up with neurosurgery regarding aneurysm monitoring Consider epidural injection for pinched nerve in neck

## 2023-08-22 ENCOUNTER — Ambulatory Visit: Admitting: Family Medicine

## 2023-08-22 NOTE — Progress Notes (Deleted)
 [facility] [address], [address] [place], [place] [address] [phone]  Date:  08/29/2023   Name:  Victoria Hall   DOB:  [birth year]   MRN:  [id number]  PCP:  Kaylee Partridge, MD    Chief Complaint: No chief complaint on file.   History of Present Illness:  Victoria Hall is a 53 y.o. very pleasant female patient who presents with the following:  Patient seen today with a couple of concerns; recurrent vaginal yeast infection and elbow swelling.  Her most recent visit with myself was in November History of CML, POTS, mast cell activation, chronic fatigue, anxiety, migraine headache  She was recently seen by neurology: Exertional headache, primary vs cervicogenic Cerebral aneurysm - I find this unlikely to be cause of her headaches Right sided cervical radiculopathy Right sided numbness, unclear etiology Punctate white matter focus, nonspecific and incidental.  Does not appear concerning for underlying disease. Indomethacin  25-50mg  as needed.  She would like to avoid propranolol due to low blood pressure Aneurysm monitoring as per neurosurgery Consider referral to pain management for consideration of epidural injection regarding cervical radiculitis (she would like to discuss with her oncologist first). Follow up in 6 months.  She also saw gynecology, Dr. 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   Hypermobility syndrome 09/26/2017   Mast cell activation syndrome (HCC) 09/26/2017   POTS (postural orthostatic tachycardia syndrome) 08/10/2017   Incomplete right bundle branch block 07/11/2017   Fatigue 07/11/2017   Low back pain 07/21/2016   Right knee  pain 06/05/2016   Left shoulder pain 06/05/2016   Hydrosalpinx 02/08/2016   Hemorrhagic ovarian cyst 02/08/2016   Right hip pain 02/03/2016   HYDROSALPINX 02/07/2008   HEADACHE 10/28/2007   TACHYCARDIA 10/14/2007   ANXIETY DISORDER 05/28/2007   POLYARTHRITIS 05/09/2007   UNSPECIFIED DISORDERS OF NERVOUS SYSTEM 04/22/2007   ABDOMINAL PAIN RIGHT LOWER QUADRANT 04/08/2007   PALPITATIONS, RECURRENT 11/07/2006   PROBLEMS W/SMELL/TASTE 06/29/2006   TOBACCO ABUSE 01/08/2006   VISUAL IMPAIRMENT 01/08/2006   DISTURBANCE, VISUAL NOS 01/08/2006   FIBROCYSTIC BREAST DISEASE 01/08/2006   DISC DISEASE, CERVICAL 01/08/2006   VERTIGO 01/08/2006    Past Medical History:  Diagnosis Date   Anxiety    [facility] h[facility]- [facility] due to Low BP    Arthritis    Asthma    early teens only    Cervical intraepithelial neoplasia grade 3 2002   s/p LEEP Rx repeat pap negative   Cervical radiculopathy    CML (chronic myelocytic leukemia) (HCC) 06/03/2018   Fallopian tube disorder    right fallopian tube removed   Gallstones 09/2015   On CT   Headache    Migraines   Hemorrhagic ovarian cyst 02/08/2016   Hydrosalpinx    [facility]   Hydrosalpinx 02/08/2016   Interstitial cystitis    Neurological abnormality    Neg workup with MRI/MRA in 2007 WNL except decreased caliber MCA proximally, distal cavernous portion of left LCA which may represent true stenosis or technique on exam. Evaluated by Dr Langston Pippins.   Palpitations    Partial seizures (HCC)    questionable diag   Pneumonia    walking pneumonia  Polycystic ovary    multiple ovarian cysts removed   S/P cervical discectomy    Dr Nigel Bart, anterior discectomy C5-C7   S/P epidural steroid injection    Right hip   Seizures (HCC) 2005   simple partial seizure disorder-was mast cell problems per pt 09-15-2019- no seizures per pt     Past Surgical History:  Procedure Laterality Date   APPENDECTOMY  1986   BREAST BIOPSY Right  09/2019   BUNIONECTOMY Left    bunion removal   CERVICAL DISC SURGERY  2005   C5-C7   COLONOSCOPY     core breast biopsy      DENTAL SURGERY     fallopian tue removed     LAPAROSCOPIC BILATERAL SALPINGECTOMY Left 02/09/2016   Procedure: LAPAROSCOPIC LEFT  SALPINGECTOMY;  Surgeon: Margaretmary Shaver, MD;  Location: [facility];  Service: Gynecology;  Laterality: Left;   LAPAROSCOPIC LYSIS OF ADHESIONS  02/09/2016   Procedure: LAPAROSCOPIC LYSIS OF ADHESIONS;  Surgeon: Margaretmary Shaver, MD;  Location: [facility];  Service: Gynecology;;  momentum to adenexa   LAPAROSCOPIC OVARIAN CYSTECTOMY Right 02/09/2016   Procedure: LAPAROSCOPIC OVARIAN CYSTECTOMY;  Surgeon: Margaretmary Shaver, MD;  Location: [facility];  Service: Gynecology;  Laterality: Right;  x2 to right ovary   LEEP  1998   OOPHORECTOMY Left 02/09/2016   Procedure: LEFT OOPHORECTOMY;  Surgeon: Margaretmary Shaver, MD;  Location: [facility];  Service: Gynecology;  Laterality: Left;- pt states left ovary has NOT been removed    POLYPECTOMY     TONSILLECTOMY  2001   UPPER GASTROINTESTINAL ENDOSCOPY     UPPER GI ENDOSCOPY      Social History   Tobacco Use   Smoking status: Former    Current packs/day: 0.00    Types: Cigarettes    Quit date: 11/02/2015    Years since quitting: 7.8   Smokeless tobacco: Never   Tobacco comments:    currently smoking, began at beginning of June and plans to quit on June 14th. Has smoked off and on in the past.    Currently   Vaping Use   Vaping status: Former  Substance Use Topics   Alcohol use: Yes    Alcohol/week: 1.0 - 2.0 standard drink of alcohol    Types: 1 - 2 Standard drinks or equivalent per week    Comment: occasionally 0-2 per day- rare    Drug use: No    Family History  Adopted: Yes  Problem Relation Age of Onset   Neuropathy Mother    COPD Mother    Breast cancer Mother    Other Mother        Teeth problems- all removed by age 81   Rashes / Skin problems Sister    Other Sister         Teeth problems- all removed by age 6   Rashes / Skin problems Brother    Asthma Brother    Other Brother        Teeth problems- all removed by age 65   Stroke Maternal Aunt    Neuropathy Maternal Aunt    Breast cancer Maternal Aunt    Neuropathy Maternal Grandmother    Adrenal disorder Neg Hx    Colon cancer Neg Hx    Colon polyps Neg Hx    Esophageal cancer Neg Hx    Stomach cancer Neg Hx    Rectal cancer Neg Hx     Allergies  Allergen Reactions   Clindamycin/Lincomycin Itching and Other (See Comments)  Dizziness, trouble swallowing   Codeine Itching and Nausea And Vomiting   Cyclobenzaprine  Other (See Comments)    Fast heartbeat, restless leg, nightmares   Gabapentin Other (See Comments)    Headaches, visual disturbances.    Nyquil Multi-Symptom [Pseudoeph-Doxylamine-Dm-Apap] Other (See Comments)    Heart racing, dizziness, weakness   Uribel [Meth-Hyo-M Bl-Na Phos-Ph Sal] Other (See Comments)    Heart racing, dizziness, blurry vision.   Xanax [Alprazolam]     Bruising   Urelle Nausea And Vomiting    Medication list has been reviewed and updated.  Current Outpatient Medications on File Prior to Visit  Medication Sig Dispense Refill   famotidine  (PEPCID ) 20 MG tablet Take 10 mg by mouth daily.     loratadine  (CLARITIN ) 10 MG tablet Take 10 mg by mouth daily.     SPRYCEL  20 MG tablet Take 20 mg by mouth daily.     No current facility-administered medications on file prior to visit.    Review of Systems:  As per HPI- otherwise negative.   Physical Examination: There were no vitals filed for this visit. There were no vitals filed for this visit. There is no height or weight on file to calculate BMI. Ideal Body Weight:    GEN: no acute distress. HEENT: Atraumatic, Normocephalic.  Ears and Nose: No external deformity. CV: RRR, No M/G/R. No JVD. No thrill. No extra heart sounds. PULM: CTA B, no wheezes, crackles, rhonchi. No retractions. No resp. distress.  No accessory muscle use. ABD: S, NT, ND, +BS. No rebound. No HSM. EXTR: No c/c/e PSYCH: Normally interactive. Conversant.    Assessment and Plan: ***  Signed Gates Kasal, MD

## 2023-08-29 ENCOUNTER — Ambulatory Visit: Admitting: Family Medicine

## 2023-09-02 NOTE — Progress Notes (Unsigned)
  Healthcare at [facility] [address] [place], [place] [address] [phone]  Date:  09/03/2023   Name:  Victoria Hall   DOB:  08-09-1970   MRN:  [id number]  PCP:  Kaylee Partridge, MD    Chief Complaint: Vaginitis (Pt states the infection seems to be "getting better" /Pt would like to do a swab/Rash on elbows, "I had it back in 2018" )   History of Present Illness:  Victoria Hall is a 53 y.o. very pleasant female patient who presents with the following:  Patient seen today with a couple of concerns; recurrent vaginal yeast infection and elbow swelling/raised skin lesion on her right elbow.  Her most recent visit with myself was in November History of CML, POTS, mast cell activation, chronic fatigue, anxiety, migraine headache  She was recently seen by neurology: Exertional headache, primary vs cervicogenic Cerebral aneurysm - I find this unlikely to be cause of her headaches Right sided cervical radiculopathy Right sided numbness, unclear etiology Punctate white matter focus, nonspecific and incidental.  Does not appear concerning for underlying disease. Indomethacin  25-50mg  as needed.  She would like to avoid propranolol due to low blood pressure Aneurysm monitoring as per neurosurgery Consider referral to pain management for consideration of epidural injection regarding cervical radiculitis (she would like to discuss with her oncologist first). Follow up in 6 months.  She also saw gynecology, Dr. Avis Boehringer not long ago for painful intercourse Her oncologist is with [facility] most recent note dated 06/13/2023  She is currently maintained on Sprycel  20 mg daily for her leukemia- actually this was stopped as well due to an insurance change and intolerance to the generic.  She is hoping she can get at least a few months of nonmedicated remission She also seemed to get a vaginal yeast infection with the generic  Sprycel - mostly itching.  Seems to better now but she would like to check on this for her She would also like to repeat her Pap today -she did have an abnormal and needed a leep in 2001  She notes a raised tender area on her right elbow - in exactly the same place she had in 2018.  It disappared with treatment for CLL   From oncology note, March of this year: # Chronic myeloid leukemia, in chronic phase History of mild leukocytosis noted 03/2017, but leukocytosis worsened 04/2018 prompting a bone marrow biopsy 05/2018 which demonstrated a hypercellular marrow with myeloid hyperplasia and megakaryocytic atypia. t(9;22) and BCR/ABL1 fusion was noted on an 86.5% cells, pointing to a diagnosis of chronic myeloid leukemia in chronic phase. BCR/ABL PCR 07/25/2018 was detected, 71.136%. There was no splenomegaly was noted on exam or on abdominal imaging at the time of her initial diagnosis. Since her diagnosis she has been on multiple tyrosine kinase inhibitors at various doses with numerous challenges with tolerance. Challenges include waxing and waning rash, fatigue, headaches, nausea, abdominal pain, diarrhea; namely generic imatinib  x3 different manufacturers, brand-name Gleevec , Dasatinib , and since 08/2022 Bosutinb 100mg  daily. She continues to experience side effects.   Her case is more unusual, as she has had exquisite response of her CML to lower doses of different TKIs than would typically yield a robust response, such as Gleevec  100 or 200mg , with BCR: ABL PCR of < 0.1% since 01/2019. Her CML thus is exquisitely sensitive to treatment, however she also has more significant side effects at typical doses, and even still at lower  doses. She likely has an inherent sensitivity to even low doses of TKI's, and we wonder if this is related to metabolism of TKI's, which is not something that can be tested for.   Since she has significant symptom burden from TKI that is very negatively impacted her life, we  talked about the option of trying for a treatment-free remission, vs. Transitioning to another TKI that she hasn't yet tried. We discussed the following 2 options: 1) Discontinue bosutinib, and proceeded with monthly monitoring off of treatment, with monthly BCR-ABL PCRs locally; 2) Transition to ponatinib (lower dose of 15mg  daily) or asciminib ( lower dose of just 20mg  daily)  She opted to proceed with option 1, and this is reasonable as she maintained MMR since 01/2019 and DMR since 06/2020. She continued with monthly BCR-ABL PCRs to monitor off of treatment, locally via [facility] in the [place] area. Unfortunately her PCR turned positive again on 01/15/23 at 0.0273. A month later, on 02/13/23, it was up to 0.1134%. We thus discussed re-initiation of TKI therapy. Since she had such a nice response to dasatinib  prior and tolerated it very well for quite some time, she opted to try this again instead of trying the potentially riskier ponatinib option, or the newest option of asciminib (which may or may not impact the mast cell process, since it targets BCR-ABL in a different way than other TKIs). She has maintained a DMR again of <0.1% transcripts on 20mg  daily of Sprycel , but has also had recurrence of significant fatigue even on this dose, similar to when she was taking Gleevec .   - reduce Sprycel  20mg  to every-other-day dosing, to see if this will mitigate the fatigue while maintaining the deep response that she quickly achieved with very low dose TKI  - repeat PCR in 1 month locally, via [facility] near her home (I have placed [facility] labs for the PCR as standing orders with 99 instances and up to q4wk checks) - she'll notify us  when the next PCR results are available, as we do not always receive these results if [facility] doesn't use our orders (this happened with her late February PCR check, which I cannot see at all in Epic despite us  having placed an order; she had to send these to us  via MyChart, as Artist. See 05/06/2023 Patient Message for results) - plan for f/u in 3-4 mos to check in on how she's feeling on this updated dose/schedule of TKI - if her insurance plan no longer allows branded Sprycel , she'll have to try the generic, which she is concerned about given her prior negative experiences with generic imatinib  vs branded Gleevec  having very different tolerability profiles at the same dose and schedule - if she has progression on 20mg  QoD of Sprycel , we discuss trying asciminib 20mg  daily instead, hoping that this low dose would be effective but more tolerable  # Mast cell activation syndrome She's followed by [facility]. She reports long history of flushing, pruritus, fatigue, and chronic rashes, concerning for this diagnosis. These symptoms are certainly consistent with mast cell activation. They are also seen with TKIs in CML, however, so hopefully she'll have significant symptom improvement off TKI. While her serum tryptase has been normal on several prior occasions, this is not unusual in MAS (tryptase is more consistently elevated in systemic mastocytosis, but in MAS it's typically normal outside of a short time frame around an episode of mast cell related symptoms). There's also been some question about whether the  TKI has been partially treating her MAS, and indeed upon re-starting TKI with Sprycel  20mg  daily she has noticed some improvement in mast cell symptoms. so we'll need to watch closely what happens and how she feels as she re-starts TKI soon. 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   Hypermobility syndrome 09/26/2017   Mast cell activation syndrome (HCC) 09/26/2017   POTS (postural orthostatic  tachycardia syndrome) 08/10/2017   Incomplete right bundle branch block 07/11/2017   Fatigue 07/11/2017   Low back pain 07/21/2016   Right knee pain 06/05/2016   Left shoulder pain 06/05/2016   Hydrosalpinx 02/08/2016   Hemorrhagic ovarian cyst 02/08/2016   Right hip pain 02/03/2016   HYDROSALPINX 02/07/2008   HEADACHE 10/28/2007   TACHYCARDIA 10/14/2007   ANXIETY DISORDER 05/28/2007   POLYARTHRITIS 05/09/2007   UNSPECIFIED DISORDERS OF NERVOUS SYSTEM 04/22/2007   ABDOMINAL PAIN RIGHT LOWER QUADRANT 04/08/2007   PALPITATIONS, RECURRENT 11/07/2006   PROBLEMS W/SMELL/TASTE 06/29/2006   TOBACCO ABUSE 01/08/2006   VISUAL IMPAIRMENT 01/08/2006   DISTURBANCE, VISUAL NOS 01/08/2006   FIBROCYSTIC BREAST DISEASE 01/08/2006   DISC DISEASE, CERVICAL 01/08/2006   VERTIGO 01/08/2006    Past Medical History:  Diagnosis Date   Anxiety    Past hx- [facility] states due to Low BP    Arthritis    Asthma    early teens only    Cervical intraepithelial neoplasia grade 3 2002   s/p LEEP Rx repeat pap negative   Cervical radiculopathy    CML (chronic myelocytic leukemia) (HCC) 06/03/2018   Fallopian tube disorder    right fallopian tube removed   Gallstones 09/2015   On CT   Headache    Migraines   Hemorrhagic ovarian cyst 02/08/2016   Hydrosalpinx    [facility]   Hydrosalpinx 02/08/2016   Interstitial cystitis    Neurological abnormality    Neg workup with MRI/MRA in 2007 WNL except decreased caliber MCA proximally, distal cavernous portion of left LCA which may represent true stenosis or technique on exam. Evaluated by Dr Langston Pippins.   Palpitations    Partial seizures (HCC)    questionable diag   Pneumonia    walking pneumonia   Polycystic ovary    multiple ovarian cysts removed   S/P cervical discectomy    Dr Nigel Bart, anterior discectomy C5-C7   S/P epidural steroid injection    Right hip   Seizures (HCC) 2005   simple partial seizure disorder-was mast cell  problems per pt 09-15-2019- no seizures per pt     Past Surgical History:  Procedure Laterality Date   APPENDECTOMY  1986   BREAST BIOPSY Right 09/2019   BUNIONECTOMY Left    bunion removal   CERVICAL DISC SURGERY  2005   C5-C7   COLONOSCOPY     core breast biopsy      DENTAL SURGERY     fallopian tue removed     LAPAROSCOPIC BILATERAL SALPINGECTOMY Left 02/09/2016   Procedure: LAPAROSCOPIC LEFT  SALPINGECTOMY;  Surgeon: Margaretmary Shaver, MD;  Location: [facility];  Service: Gynecology;  Laterality: Left;   LAPAROSCOPIC LYSIS OF ADHESIONS  02/09/2016   Procedure: LAPAROSCOPIC LYSIS OF ADHESIONS;  Surgeon: Margaretmary Shaver, MD;  Location: [facility];  Service: Gynecology;;  momentum to adenexa   LAPAROSCOPIC OVARIAN CYSTECTOMY Right 02/09/2016   Procedure: LAPAROSCOPIC OVARIAN CYSTECTOMY;  Surgeon: Margaretmary Shaver, MD;  Location: [facility];  Service: Gynecology;  Laterality: Right;  x2 to right ovary   LEEP  1998   OOPHORECTOMY Left 02/09/2016   Procedure: LEFT OOPHORECTOMY;  Surgeon: Margaretmary Shaver, MD;  Location: [facility];  Service: Gynecology;  Laterality: Left;- pt states left ovary has NOT been removed    POLYPECTOMY     TONSILLECTOMY  2001   UPPER GASTROINTESTINAL ENDOSCOPY     UPPER GI ENDOSCOPY      Social History   Tobacco Use   Smoking status: Former    Current packs/day: 0.00    Types: Cigarettes    Quit date: 11/02/2015    Years since quitting: 7.8   Smokeless tobacco: Never   Tobacco comments:    currently smoking, began at beginning of June and plans to quit on June 14th. Has smoked off and on in the past.    Currently   Vaping Use   Vaping status: Former  Substance Use Topics   Alcohol use: Yes    Alcohol/week: 1.0 - 2.0 standard drink of alcohol    Types: 1 - 2 Standard drinks or equivalent per week    Comment: occasionally 0-2 per day- rare    Drug use: No    Family History  Adopted: Yes  Problem Relation Age of Onset   Neuropathy Mother    COPD  Mother    Breast cancer Mother    Other Mother        Teeth problems- all removed by age 28   Rashes / Skin problems Sister    Other Sister        Teeth problems- all removed by age 48   Rashes / Skin problems Brother    Asthma Brother    Other Brother        Teeth problems- all removed by age 74   Stroke Maternal Aunt    Neuropathy Maternal Aunt    Breast cancer Maternal Aunt    Neuropathy Maternal Grandmother    Adrenal disorder Neg Hx    Colon cancer Neg Hx    Colon polyps Neg Hx    Esophageal cancer Neg Hx    Stomach cancer Neg Hx    Rectal cancer Neg Hx     Allergies  Allergen Reactions   Clindamycin/Lincomycin Itching and Other (See Comments)    Dizziness, trouble swallowing   Codeine Itching and Nausea And Vomiting   Cyclobenzaprine  Other (See Comments)    Fast heartbeat, restless leg, nightmares   Gabapentin Other (See Comments)    Headaches, visual disturbances.    Nyquil Multi-Symptom [Pseudoeph-Doxylamine-Dm-Apap] Other (See Comments)    Heart racing, dizziness, weakness   Uribel [Meth-Hyo-M Bl-Na Phos-Ph Sal] Other (See Comments)    Heart racing, dizziness, blurry vision.   Xanax [Alprazolam]     Bruising   Urelle Nausea And Vomiting    Medication list has been reviewed and updated.  Current Outpatient Medications on File Prior to Visit  Medication Sig Dispense Refill   famotidine  (PEPCID ) 20 MG tablet Take 10 mg by mouth daily.     loratadine  (CLARITIN ) 10 MG tablet Take 10 mg by mouth daily.     SPRYCEL  20 MG tablet Take 20 mg by mouth daily. (Patient not taking: Reported on 09/03/2023)     No current facility-administered medications on file prior to visit.    Review of Systems:  As per HPI- otherwise negative.   Physical Examination: Vitals:   09/03/23 1047  BP: 124/72  Pulse: 63  SpO2: 98%   Vitals:   09/03/23 1047  Weight: 182 lb 3.2 oz (82.6 kg)  Height: 5\' 6"  (1.676 m)   Body mass index is 29.41 kg/m. Ideal Body Weight: Weight  in (lb) to have BMI = 25: 154.6  GEN: no acute distress.  Mild overweight. Looks well  HEENT: Atraumatic, Normocephalic.  Bilateral TM wnl, oropharynx normal.  PEERL,EOMI.   Ears and Nose: No external deformity. CV: RRR, No M/G/R. No JVD. No thrill. No extra heart sounds. PULM: CTA B, no wheezes, crackles, rhonchi. No retractions. No resp. distress. No accessory muscle use. ABD: S, NT, ND, +BS. No rebound. No HSM. EXTR: No c/c/e PSYCH: Normally interactive. Conversant.  Pelvic: Normal vulva, vagina, cervix.  Pap collected Her elbow displays 2 discrete, slightly hyperpigmented palpable raised papules as seen below.  They are mildly tender.  Assessment and Plan: Screening for cervical cancer - Plan: Cytology - PAP  Vaginal itching - Plan: Cytology - PAP  Anxiety - Plan: clonazePAM  (KLONOPIN ) 0.5 MG tablet  Adjustment insomnia - Plan: Ambulatory referral to Dermatology  Patient seen today for a couple of concerns.  Pap smear collected, this will also look for any persistent yeast infection.  She might use an over-the-counter topical antifungal in the meantime  Refill clonazepam  which she uses on rare occasion for insomnia  I am not sure what is causing the skin find on her elbow.  A biopsy may be indicated.  Referral dermatology with [facility]  Signed Gates Kasal, MD

## 2023-09-03 ENCOUNTER — Ambulatory Visit: Admitting: Family Medicine

## 2023-09-03 ENCOUNTER — Other Ambulatory Visit (HOSPITAL_COMMUNITY)
Admission: RE | Admit: 2023-09-03 | Discharge: 2023-09-03 | Disposition: A | Discharge status: Home / Self Care | Source: Ambulatory Visit | Attending: Family Medicine | Admitting: Family Medicine

## 2023-09-03 VITALS — BP 124/72 | HR 63 | Ht 66.0 in | Wt 182.2 lb

## 2023-09-03 DIAGNOSIS — F419 Anxiety disorder, unspecified: Secondary | ICD-10-CM

## 2023-09-03 DIAGNOSIS — N898 Other specified noninflammatory disorders of vagina: Secondary | ICD-10-CM

## 2023-09-03 DIAGNOSIS — Z124 Encounter for screening for malignant neoplasm of cervix: Secondary | ICD-10-CM

## 2023-09-03 DIAGNOSIS — F5102 Adjustment insomnia: Secondary | ICD-10-CM | POA: Diagnosis not present

## 2023-09-03 MED ORDER — CLONAZEPAM 0.5 MG PO TABS: 0.5000 mg | ORAL_TABLET | Freq: Two times a day (BID) | ORAL | 1 refills | Status: AC | PRN

## 2023-09-03 NOTE — Patient Instructions (Signed)
 I will be in touch with your pap report Ok to try topical monistat Refilled your clonazepam  Referral to dermatology at [facility]

## 2023-09-05 ENCOUNTER — Encounter: Payer: Self-pay | Admitting: Family Medicine

## 2023-09-05 LAB — CYTOLOGY - PAP
Adequacy: ABSENT
Comment: NEGATIVE
Diagnosis: NEGATIVE
High risk HPV: NEGATIVE

## 2023-10-31 NOTE — Progress Notes (Deleted)
  Healthcare at [facility] [address], [address] [place], [place] [address] [phone]  Date:  11/05/2023   Name:  Victoria Hall   DOB:  [birth year]   MRN:  [id number]  PCP:  Watt Harlene BROCKS, MD    Chief Complaint: No chief complaint on file.   History of Present Illness:  Victoria Hall is a 53 y.o. very pleasant female patient who presents with the following:  Pt seen today with concern of left shoulder pain Last seen by myself in June for a routine visit   History of CML, POTS, mast cell activation, chronic fatigue, anxiety, migraine headache  Seen by oncology at [facility] in June also: # Chronic myeloid leukemia, in chronic phase  History of mild leukocytosis noted 03/2017, but leukocytosis worsened 04/2018 prompting a bone marrow biopsy 05/2018 which demonstrated a hypercellular marrow with myeloid hyperplasia and megakaryocytic atypia. t(9;22) and BCR/ABL1 fusion was noted on an 86.5% cells, pointing to a diagnosis of chronic myeloid leukemia in chronic phase. BCR/ABL PCR 07/25/2018 was detected, 71.136%. There was no splenomegaly was noted on exam or on abdominal imaging at the time of her initial diagnosis. Since her diagnosis she has been on multiple tyrosine kinase inhibitors at various doses with numerous challenges with tolerance. Challenges include waxing and waning rash, fatigue, headaches, nausea, abdominal pain, diarrhea; namely generic imatinib  x3 different manufacturers, brand-name Gleevec , Dasatinib , and since 08/2022 Bosutinb 100mg  daily. She continues to experience side effects.   Her case is more unusual, as she has had exquisite response of her CML to lower doses of different TKIs than would typically yield a robust response, such as Gleevec  100 or 200mg , with BCR: ABL PCR of < 0.1% since 01/2019. Her CML thus is exquisitely sensitive to treatment, however she also has more significant side effects at typical doses,  and even still at lower doses. She likely has an inherent sensitivity to even low doses of TKI's, and we wonder if this is related to metabolism of TKI's, which is not something that can be tested for.   Since she has significant symptom burden from TKI that is very negatively impacted her life, we talked about the option of trying for a treatment-free remission, vs. Transitioning to another TKI that she hasn't yet tried. We discussed the following 2 options: 1) Discontinue bosutinib, and proceeded with monthly monitoring off of treatment, with monthly BCR-ABL PCRs locally; 2) Transition to ponatinib (lower dose of 15mg  daily) or asciminib ( lower dose of just 20mg  daily)  She opted to proceed with option 1, and this is reasonable as she maintained MMR since 01/2019 and DMR since 06/2020. She continued with monthly BCR-ABL PCRs to monitor off of treatment, locally via [facility] in the [place] area. Unfortunately her PCR turned positive again on 01/15/23 at 0.0273. A month later, on 02/13/23, it was up to 0.1134%. We thus discussed re-initiation of TKI therapy. Since she had such a nice response to dasatinib  prior and tolerated it very well for quite some time, she opted to try this again instead of trying the potentially riskier ponatinib option, or the newest option of asciminib (which may or may not impact the mast cell process, since it targets BCR-ABL in a different way than other TKIs). She has maintained a DMR again of <0.1% transcripts on 20mg  daily of Sprycel , but has also had recurrence of significant fatigue even on this dose, similar to when she was taking Gleevec . Even  with transition to Sprycel  every other day, she continued to have fatigue. Due to insurance coverage issues, she was transitioned to Dasatinib  in May 2025, but have significant side effects, so has been off of all TKI therapy since 08/16/23. Today, she reports residual side effects including headaches, dyspnea, and fatigue although  these have improved from when she was on Dasatinib . She will continue off of any TKI pending BCR/ABL from today.  - f/u pending BCR/ABL from today; her last from 08/2023 was negative - if BCR/ABL PCR IS remains < 0.1% then she continue off of TKI, given her challenges with side effects - if BCR/ABL PCR IS is > 0.1% then we would recommend starting asciminib 20mg  daily, and hope that this low dose would be effective and more tolerable than the FDA labeled dose and schedule - RTC with Dr. Genny in 3-4 months - some of her symptoms could be consistent with TKI withdrawal, so we offered her a prednisone taper; she would like to hold off on this for now, but will let us  know if she changes her mind.  # Mast cell activation syndrome # Concern for possible indolent systemic mastocytosis She's followed by [facility]. She reports long history of flushing, pruritus, fatigue, and chronic rashes, concerning for this diagnosis. These symptoms are certainly consistent with mast cell activation. They are also seen with TKIs in CML, however, so hopefully she'll have significant symptom improvement off TKI. While her serum tryptase has been normal on several prior occasions, this is not unusual in MAS (tryptase is more consistently elevated in systemic mastocytosis, but in MAS it's typically normal outside of a short time frame around an episode of mast cell related symptoms). There's also been some question about whether the TKI has been partially treating her MAS, and indeed upon re-starting TKI she has noticed improvements in mast cell activation related symptoms, namely rashes. Today since stopping TKI, she has noticed a new rash on her right elbow (image above). Non-pruritic and non-tender in nature. Workup with allergy/immunology 01/2023 was negative.We have recommended she use her prior supply of topical steroid to determine if her rash improves or not. Since she is off TKI now, and with this  history of symptoms, it is reasonable to evaluate her for systemic mastocytosis.  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   Hypermobility syndrome 09/26/2017   Mast cell activation syndrome (HCC) 09/26/2017   POTS (postural orthostatic tachycardia syndrome) 08/10/2017   Incomplete right bundle branch block 07/11/2017   Fatigue 07/11/2017   Low back pain 07/21/2016   Right knee pain 06/05/2016   Left shoulder pain 06/05/2016   Hydrosalpinx 02/08/2016   Hemorrhagic ovarian cyst 02/08/2016   Right hip pain 02/03/2016   HYDROSALPINX 02/07/2008   HEADACHE 10/28/2007   TACHYCARDIA 10/14/2007   ANXIETY DISORDER 05/28/2007   POLYARTHRITIS 05/09/2007   UNSPECIFIED DISORDERS OF NERVOUS SYSTEM 04/22/2007   ABDOMINAL PAIN RIGHT LOWER QUADRANT 04/08/2007   PALPITATIONS, RECURRENT 11/07/2006   PROBLEMS W/SMELL/TASTE 06/29/2006   TOBACCO ABUSE 01/08/2006   VISUAL IMPAIRMENT 01/08/2006  DISTURBANCE, VISUAL NOS 01/08/2006   FIBROCYSTIC BREAST DISEASE 01/08/2006   DISC DISEASE, CERVICAL 01/08/2006   VERTIGO 01/08/2006    Past Medical History:  Diagnosis Date   Anxiety    Past hx- [facility] states due to Low BP    Arthritis    Asthma    early teens only    Cervical intraepithelial neoplasia grade 3 2002   s/p LEEP Rx repeat pap negative   Cervical radiculopathy    CML (chronic myelocytic leukemia) (HCC) 06/03/2018   Fallopian tube disorder    right fallopian tube removed   Gallstones 09/2015   On CT   Headache    Migraines   Hemorrhagic ovarian  cyst 02/08/2016   Hydrosalpinx    [facility]   Hydrosalpinx 02/08/2016   Interstitial cystitis    Neurological abnormality    Neg workup with MRI/MRA in 2007 WNL except decreased caliber MCA proximally, distal cavernous portion of left LCA which may represent true stenosis or technique on exam. Evaluated by Dr Birder.   Palpitations    Partial seizures (HCC)    questionable diag   Pneumonia    walking pneumonia   Polycystic ovary    multiple ovarian cysts removed   S/P cervical discectomy    Dr Unice, anterior discectomy C5-C7   S/P epidural steroid injection    Right hip   Seizures (HCC) 2005   simple partial seizure disorder-was mast cell problems per pt 09-15-2019- no seizures per pt     Past Surgical History:  Procedure Laterality Date   APPENDECTOMY  1986   BREAST BIOPSY Right 09/2019   BUNIONECTOMY Left    bunion removal   CERVICAL DISC SURGERY  2005   C5-C7   COLONOSCOPY     core breast biopsy      DENTAL SURGERY     fallopian tue removed     LAPAROSCOPIC BILATERAL SALPINGECTOMY Left 02/09/2016   Procedure: LAPAROSCOPIC LEFT  SALPINGECTOMY;  Surgeon: Ezzie Buba, MD;  Location: [facility];  Service: Gynecology;  Laterality: Left;   LAPAROSCOPIC LYSIS OF ADHESIONS  02/09/2016   Procedure: LAPAROSCOPIC LYSIS OF ADHESIONS;  Surgeon: Ezzie Buba, MD;  Location: [facility];  Service: Gynecology;;  momentum to adenexa   LAPAROSCOPIC OVARIAN CYSTECTOMY Right 02/09/2016   Procedure: LAPAROSCOPIC OVARIAN CYSTECTOMY;  Surgeon: Ezzie Buba, MD;  Location: [facility];  Service: Gynecology;  Laterality: Right;  x2 to right ovary   LEEP  1998   OOPHORECTOMY Left 02/09/2016   Procedure: LEFT OOPHORECTOMY;  Surgeon: Ezzie Buba, MD;  Location: [facility];  Service: Gynecology;  Laterality: Left;- pt states left ovary has NOT been removed    POLYPECTOMY     TONSILLECTOMY  2001   UPPER GASTROINTESTINAL ENDOSCOPY     UPPER GI ENDOSCOPY      Social  History   Tobacco Use   Smoking status: Former    Current packs/day: 0.00    Types: Cigarettes    Quit date: 11/02/2015    Years since quitting: 8.0   Smokeless tobacco: Never   Tobacco comments:    currently smoking, began at beginning of June and plans to quit on June 14th. Has smoked off and on in the past.    Currently   Vaping Use   Vaping status: Former  Substance Use Topics   Alcohol use: Yes    Alcohol/week: 1.0 - 2.0 standard drink of alcohol    Types: 1 - 2 Standard drinks or equivalent per week  Comment: occasionally 0-2 per day- rare    Drug use: No    Family History  Adopted: Yes  Problem Relation Age of Onset   Neuropathy Mother    COPD Mother    Breast cancer Mother    Other Mother        Teeth problems- all removed by age 83   Rashes / Skin problems Sister    Other Sister        Teeth problems- all removed by age 29   Rashes / Skin problems Brother    Asthma Brother    Other Brother        Teeth problems- all removed by age 71   Stroke Maternal Aunt    Neuropathy Maternal Aunt    Breast cancer Maternal Aunt    Neuropathy Maternal Grandmother    Adrenal disorder Neg Hx    Colon cancer Neg Hx    Colon polyps Neg Hx    Esophageal cancer Neg Hx    Stomach cancer Neg Hx    Rectal cancer Neg Hx     Allergies  Allergen Reactions   Clindamycin/Lincomycin Itching and Other (See Comments)    Dizziness, trouble swallowing   Codeine Itching and Nausea And Vomiting   Cyclobenzaprine  Other (See Comments)    Fast heartbeat, restless leg, nightmares   Gabapentin Other (See Comments)    Headaches, visual disturbances.    Nyquil Multi-Symptom [Pseudoeph-Doxylamine-Dm-Apap] Other (See Comments)    Heart racing, dizziness, weakness   Uribel [Meth-Hyo-M Bl-Na Phos-Ph Sal] Other (See Comments)    Heart racing, dizziness, blurry vision.   Xanax [Alprazolam]     Bruising   Urelle Nausea And Vomiting    Medication list has been reviewed and  updated.  Current Outpatient Medications on File Prior to Visit  Medication Sig Dispense Refill   clonazePAM  (KLONOPIN ) 0.5 MG tablet Take 1 tablet (0.5 mg total) by mouth 2 (two) times daily as needed for anxiety. 20 tablet 1   famotidine  (PEPCID ) 20 MG tablet Take 10 mg by mouth daily.     loratadine  (CLARITIN ) 10 MG tablet Take 10 mg by mouth daily.     SPRYCEL  20 MG tablet Take 20 mg by mouth daily. (Patient not taking: Reported on 09/03/2023)     No current facility-administered medications on file prior to visit.    Review of Systems:  As per HPI- otherwise negative.   Physical Examination: There were no vitals filed for this visit. There were no vitals filed for this visit. There is no height or weight on file to calculate BMI. Ideal Body Weight:    GEN: no acute distress. HEENT: Atraumatic, Normocephalic.  Ears and Nose: No external deformity. CV: RRR, No M/G/R. No JVD. No thrill. No extra heart sounds. PULM: CTA B, no wheezes, crackles, rhonchi. No retractions. No resp. distress. No accessory muscle use. ABD: S, NT, ND, +BS. No rebound. No HSM. EXTR: No c/c/e PSYCH: Normally interactive. Conversant.    Assessment and Plan: ***  Signed Harlene Schroeder, MD

## 2023-11-05 ENCOUNTER — Ambulatory Visit: Admitting: Family Medicine

## 2023-11-22 ENCOUNTER — Other Ambulatory Visit: Payer: Self-pay | Admitting: Family Medicine

## 2023-11-22 DIAGNOSIS — Z1231 Encounter for screening mammogram for malignant neoplasm of breast: Secondary | ICD-10-CM

## 2023-12-10 ENCOUNTER — Encounter: Payer: Self-pay | Admitting: Family

## 2023-12-10 ENCOUNTER — Ambulatory Visit
Admission: RE | Admit: 2023-12-10 | Discharge: 2023-12-10 | Disposition: A | Discharge status: Home / Self Care | Source: Ambulatory Visit | Attending: Family Medicine | Admitting: Family Medicine

## 2023-12-10 DIAGNOSIS — Z1231 Encounter for screening mammogram for malignant neoplasm of breast: Secondary | ICD-10-CM

## 2024-01-01 ENCOUNTER — Other Ambulatory Visit (HOSPITAL_COMMUNITY): Payer: Self-pay | Admitting: Neurosurgery

## 2024-01-01 ENCOUNTER — Encounter: Payer: Self-pay | Admitting: Pulmonary Disease

## 2024-01-01 DIAGNOSIS — I671 Cerebral aneurysm, nonruptured: Secondary | ICD-10-CM

## 2024-01-08 ENCOUNTER — Ambulatory Visit (HOSPITAL_COMMUNITY)
Admission: RE | Admit: 2024-01-08 | Discharge: 2024-01-08 | Disposition: A | Discharge status: Home / Self Care | Source: Ambulatory Visit | Attending: Neurosurgery | Admitting: Neurosurgery

## 2024-01-08 DIAGNOSIS — I671 Cerebral aneurysm, nonruptured: Secondary | ICD-10-CM | POA: Insufficient documentation

## 2024-01-29 ENCOUNTER — Ambulatory Visit: Admitting: Neurology

## 2024-05-12 ENCOUNTER — Encounter: Admitting: Family Medicine
